# Patient Record
Sex: Male | Born: 1935 | Race: White | Hispanic: No | Smoking: Former smoker
Health system: Southern US, Community
[De-identification: ages and names within clinical notes are randomized; demographics above are authoritative.]

## PROBLEM LIST (undated history)

## (undated) DIAGNOSIS — I10 Essential (primary) hypertension: Secondary | ICD-10-CM

## (undated) DIAGNOSIS — T8859XA Other complications of anesthesia, initial encounter: Secondary | ICD-10-CM

## (undated) DIAGNOSIS — Z9289 Personal history of other medical treatment: Secondary | ICD-10-CM

## (undated) DIAGNOSIS — G3183 Dementia with Lewy bodies: Secondary | ICD-10-CM

## (undated) DIAGNOSIS — F0281 Dementia in other diseases classified elsewhere with behavioral disturbance: Secondary | ICD-10-CM

## (undated) DIAGNOSIS — Z8719 Personal history of other diseases of the digestive system: Secondary | ICD-10-CM

## (undated) DIAGNOSIS — F02818 Neurocognitive disorder with Lewy bodies: Secondary | ICD-10-CM

## (undated) DIAGNOSIS — Z9109 Other allergy status, other than to drugs and biological substances: Secondary | ICD-10-CM

## (undated) DIAGNOSIS — Z860100 Personal history of colon polyps, unspecified: Secondary | ICD-10-CM

## (undated) DIAGNOSIS — S2249XA Multiple fractures of ribs, unspecified side, initial encounter for closed fracture: Secondary | ICD-10-CM

## (undated) DIAGNOSIS — G4730 Sleep apnea, unspecified: Secondary | ICD-10-CM

## (undated) DIAGNOSIS — C189 Malignant neoplasm of colon, unspecified: Secondary | ICD-10-CM

## (undated) DIAGNOSIS — T4145XA Adverse effect of unspecified anesthetic, initial encounter: Secondary | ICD-10-CM

## (undated) DIAGNOSIS — K219 Gastro-esophageal reflux disease without esophagitis: Secondary | ICD-10-CM

## (undated) DIAGNOSIS — D693 Immune thrombocytopenic purpura: Secondary | ICD-10-CM

## (undated) DIAGNOSIS — N200 Calculus of kidney: Secondary | ICD-10-CM

## (undated) DIAGNOSIS — F418 Other specified anxiety disorders: Secondary | ICD-10-CM

## (undated) DIAGNOSIS — C4490 Unspecified malignant neoplasm of skin, unspecified: Secondary | ICD-10-CM

## (undated) DIAGNOSIS — K859 Acute pancreatitis without necrosis or infection, unspecified: Secondary | ICD-10-CM

## (undated) DIAGNOSIS — F3341 Major depressive disorder, recurrent, in partial remission: Secondary | ICD-10-CM

## (undated) HISTORY — DX: Dementia in other diseases classified elsewhere with behavioral disturbance: F02.81

## (undated) HISTORY — PX: UPPER GASTROINTESTINAL ENDOSCOPY: SHX188

## (undated) HISTORY — DX: Neurocognitive disorder with Lewy bodies: G31.83

## (undated) HISTORY — PX: ESOPHAGEAL DILATION: SHX303

## (undated) HISTORY — DX: Personal history of colonic polyps: Z86.010

## (undated) HISTORY — DX: Personal history of colon polyps, unspecified: Z86.0100

## (undated) HISTORY — DX: Malignant neoplasm of colon, unspecified: C18.9

## (undated) HISTORY — DX: Acute pancreatitis without necrosis or infection, unspecified: K85.90

## (undated) HISTORY — DX: Neurocognitive disorder with Lewy bodies: F02.818

## (undated) HISTORY — DX: Major depressive disorder, recurrent, in partial remission: F33.41

## (undated) HISTORY — PX: TOTAL HIP ARTHROPLASTY: SHX124

## (undated) HISTORY — DX: Other allergy status, other than to drugs and biological substances: Z91.09

## (undated) HISTORY — PX: INGUINAL HERNIA REPAIR: SUR1180

## (undated) HISTORY — DX: Personal history of other diseases of the digestive system: Z87.19

## (undated) HISTORY — PX: SKIN CANCER EXCISION: SHX779

## (undated) HISTORY — DX: Unspecified malignant neoplasm of skin, unspecified: C44.90

## (undated) HISTORY — DX: Essential (primary) hypertension: I10

## (undated) HISTORY — PX: PILONIDAL CYST EXCISION: SHX744

## (undated) HISTORY — DX: Gastro-esophageal reflux disease without esophagitis: K21.9

## (undated) HISTORY — DX: Sleep apnea, unspecified: G47.30

## (undated) HISTORY — DX: Calculus of kidney: N20.0

## (undated) HISTORY — DX: Major depressive disorder, recurrent, in partial remission: F41.8

---
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
## 1999-05-16 HISTORY — PX: CARDIAC CATHETERIZATION: SHX172

## 2005-04-03 ENCOUNTER — Ambulatory Visit: Payer: Self-pay | Admitting: Urology

## 2005-08-17 ENCOUNTER — Ambulatory Visit: Payer: Self-pay | Admitting: Unknown Physician Specialty

## 2006-09-14 HISTORY — PX: LITHOTRIPSY: SUR834

## 2007-01-30 ENCOUNTER — Emergency Department: Payer: Self-pay | Admitting: Emergency Medicine

## 2007-02-08 ENCOUNTER — Ambulatory Visit: Payer: Self-pay | Admitting: Urology

## 2007-02-09 ENCOUNTER — Ambulatory Visit: Payer: Self-pay | Admitting: Urology

## 2007-02-10 ENCOUNTER — Ambulatory Visit: Payer: Self-pay | Admitting: Urology

## 2007-02-24 ENCOUNTER — Ambulatory Visit: Payer: Self-pay | Admitting: Urology

## 2008-02-20 ENCOUNTER — Ambulatory Visit: Payer: Self-pay | Admitting: Urology

## 2008-03-15 ENCOUNTER — Other Ambulatory Visit: Payer: Self-pay

## 2008-03-15 ENCOUNTER — Emergency Department: Payer: Self-pay | Admitting: Emergency Medicine

## 2008-06-14 ENCOUNTER — Ambulatory Visit: Payer: Self-pay | Admitting: Oncology

## 2008-07-09 ENCOUNTER — Ambulatory Visit: Payer: Self-pay | Admitting: Oncology

## 2008-07-15 ENCOUNTER — Ambulatory Visit: Payer: Self-pay | Admitting: Oncology

## 2008-08-14 ENCOUNTER — Ambulatory Visit: Payer: Self-pay | Admitting: Oncology

## 2008-10-15 ENCOUNTER — Ambulatory Visit: Payer: Self-pay | Admitting: Oncology

## 2008-10-29 ENCOUNTER — Ambulatory Visit: Payer: Self-pay | Admitting: Oncology

## 2008-11-12 ENCOUNTER — Ambulatory Visit: Payer: Self-pay | Admitting: Oncology

## 2009-01-12 ENCOUNTER — Ambulatory Visit: Payer: Self-pay | Admitting: Oncology

## 2009-02-11 ENCOUNTER — Ambulatory Visit: Payer: Self-pay | Admitting: Oncology

## 2009-02-12 ENCOUNTER — Ambulatory Visit: Payer: Self-pay | Admitting: Oncology

## 2009-02-19 ENCOUNTER — Ambulatory Visit: Payer: Self-pay | Admitting: Urology

## 2009-04-14 ENCOUNTER — Ambulatory Visit: Payer: Self-pay | Admitting: Oncology

## 2009-04-29 ENCOUNTER — Ambulatory Visit: Payer: Self-pay | Admitting: Oncology

## 2009-05-15 ENCOUNTER — Ambulatory Visit: Payer: Self-pay | Admitting: Oncology

## 2009-07-29 ENCOUNTER — Ambulatory Visit: Payer: Self-pay | Admitting: Oncology

## 2009-08-14 ENCOUNTER — Ambulatory Visit: Payer: Self-pay | Admitting: Oncology

## 2009-09-14 HISTORY — PX: CHOLECYSTECTOMY: SHX55

## 2009-10-15 ENCOUNTER — Ambulatory Visit: Payer: Self-pay | Admitting: Oncology

## 2009-10-15 DIAGNOSIS — K859 Acute pancreatitis without necrosis or infection, unspecified: Secondary | ICD-10-CM

## 2009-10-15 HISTORY — DX: Acute pancreatitis without necrosis or infection, unspecified: K85.90

## 2009-10-28 ENCOUNTER — Ambulatory Visit: Payer: Self-pay | Admitting: Oncology

## 2009-11-09 ENCOUNTER — Inpatient Hospital Stay: Payer: Self-pay | Admitting: General Surgery

## 2009-11-12 ENCOUNTER — Ambulatory Visit: Payer: Self-pay | Admitting: Oncology

## 2009-11-26 ENCOUNTER — Encounter: Admission: RE | Admit: 2009-11-26 | Discharge: 2009-11-26 | Payer: Self-pay | Admitting: Neurology

## 2010-01-27 ENCOUNTER — Ambulatory Visit: Payer: Self-pay | Admitting: Oncology

## 2010-02-12 ENCOUNTER — Ambulatory Visit: Payer: Self-pay | Admitting: Oncology

## 2010-04-28 ENCOUNTER — Ambulatory Visit: Payer: Self-pay | Admitting: Oncology

## 2010-05-15 ENCOUNTER — Ambulatory Visit: Payer: Self-pay | Admitting: Oncology

## 2010-10-05 ENCOUNTER — Encounter: Payer: Self-pay | Admitting: Neurology

## 2010-10-30 ENCOUNTER — Ambulatory Visit: Payer: Self-pay | Admitting: Oncology

## 2010-11-13 ENCOUNTER — Ambulatory Visit: Payer: Self-pay | Admitting: Oncology

## 2011-03-05 ENCOUNTER — Ambulatory Visit: Payer: Self-pay | Admitting: Urology

## 2011-05-06 ENCOUNTER — Ambulatory Visit: Payer: Self-pay | Admitting: Oncology

## 2011-05-11 ENCOUNTER — Ambulatory Visit (INDEPENDENT_AMBULATORY_CARE_PROVIDER_SITE_OTHER): Payer: Medicare Other | Admitting: Family Medicine

## 2011-05-11 ENCOUNTER — Encounter: Payer: Self-pay | Admitting: Family Medicine

## 2011-05-11 DIAGNOSIS — N200 Calculus of kidney: Secondary | ICD-10-CM | POA: Insufficient documentation

## 2011-05-11 DIAGNOSIS — G4730 Sleep apnea, unspecified: Secondary | ICD-10-CM | POA: Insufficient documentation

## 2011-05-11 DIAGNOSIS — Z9109 Other allergy status, other than to drugs and biological substances: Secondary | ICD-10-CM | POA: Insufficient documentation

## 2011-05-11 DIAGNOSIS — I10 Essential (primary) hypertension: Secondary | ICD-10-CM

## 2011-05-11 DIAGNOSIS — K219 Gastro-esophageal reflux disease without esophagitis: Secondary | ICD-10-CM

## 2011-05-11 DIAGNOSIS — F0390 Unspecified dementia without behavioral disturbance: Secondary | ICD-10-CM | POA: Insufficient documentation

## 2011-05-11 NOTE — Progress Notes (Signed)
  Subjective:    Patient ID: John Summers, male    DOB: Aug 02, 1933, 77 y.o.   MRN: [id number]  HPI  John Summers, a 77 y.o. male presents today in the office for the following:    HTN:  BP Readings from Last 3 Encounters:  05/11/11 120/70   Stable on Maxide  Low platelets - seeing Dr. Orlie Dakin, Platelets around 110.  Memory problems, several years. Slowly has been gietting worse. Taking Aricept  Vision difficulties, Saw Dr. Oren Bracket and Neurologist both. Some double vision and has stopped driving at night.   The PMH, PSH, Social History, Family History, Medications, and allergies have been reviewed in [facility], and have been updated if relevant.  Review of Systems ROS: GEN: No acute illnesses, no fevers, chills. GI: No n/v/d, eating normally Pulm: No SOB Interactive and getting along well at home.  Otherwise, ROS is as per the HPI.     Objective:   Physical Exam   Physical Exam  Blood pressure 120/70, pulse 76, temperature 98.7 F (37.1 C), temperature source Oral, height 5' 9.25" (1.759 m), weight 242 lb (109.77 kg), SpO2 98.00%.  GEN: WDWN, NAD, Non-toxic, A & O x 3 HEENT: Atraumatic, Normocephalic. Neck supple. No masses, No LAD. Ears and Nose: No external deformity. CV: RRR, No M/G/R. No JVD. No thrill. No extra heart sounds. PULM: CTA B, no wheezes, crackles, rhonchi. No retractions. No resp. distress. No accessory muscle use. EXTR: No c/c/e NEURO Normal gait.  PSYCH: Normally interactive. Conversant. Not depressed or anxious appearing.  Calm demeanor.        Assessment & Plan:   1. GERD (gastroesophageal reflux disease)   2. Dementia   3. Unspecified essential hypertension   cont on current meds  Reviewed labs.  We will obtain records from the patient's prior physicians.

## 2011-05-11 NOTE — Patient Instructions (Signed)
F/u 6 months for 30 minute CPX  Labs a few days before

## 2011-05-16 ENCOUNTER — Ambulatory Visit: Payer: Self-pay | Admitting: Oncology

## 2011-08-20 ENCOUNTER — Ambulatory Visit (INDEPENDENT_AMBULATORY_CARE_PROVIDER_SITE_OTHER): Payer: 59

## 2011-08-20 DIAGNOSIS — Z23 Encounter for immunization: Secondary | ICD-10-CM

## 2011-10-30 ENCOUNTER — Other Ambulatory Visit: Payer: Self-pay | Admitting: Family Medicine

## 2011-10-30 DIAGNOSIS — Z79899 Other long term (current) drug therapy: Secondary | ICD-10-CM

## 2011-10-30 DIAGNOSIS — Z13220 Encounter for screening for lipoid disorders: Secondary | ICD-10-CM

## 2011-11-05 ENCOUNTER — Other Ambulatory Visit (INDEPENDENT_AMBULATORY_CARE_PROVIDER_SITE_OTHER): Payer: 59

## 2011-11-05 DIAGNOSIS — Z79899 Other long term (current) drug therapy: Secondary | ICD-10-CM

## 2011-11-05 DIAGNOSIS — Z13220 Encounter for screening for lipoid disorders: Secondary | ICD-10-CM

## 2011-11-05 LAB — BASIC METABOLIC PANEL
Calcium: 8.8 mg/dL (ref 8.4–10.5)
Chloride: 108 mEq/L (ref 96–112)
GFR: 74.41 mL/min (ref >60.00)
Potassium: 4.1 mEq/L (ref 3.5–5.1)

## 2011-11-05 LAB — CBC WITH DIFFERENTIAL/PLATELET
Eosinophils Absolute: 0.1 10*3/uL (ref 0.0–0.7)
Lymphs Abs: 1.6 10*3/uL (ref 0.7–4.0)
MCV: 99.5 fl (ref 78.0–100.0)
Monocytes Absolute: 0.5 10*3/uL (ref 0.1–1.0)
Monocytes Relative: 11.4 % (ref 3.0–12.0)
RBC: 4.2 Mil/uL — ABNORMAL LOW (ref 4.22–5.81)

## 2011-11-05 LAB — HEPATIC FUNCTION PANEL
AST: 13 U/L (ref 0–37)
Bilirubin, Direct: 0.1 mg/dL (ref 0.0–0.3)
Total Bilirubin: 0.5 mg/dL (ref 0.3–1.2)
Total Protein: 7.1 g/dL (ref 6.0–8.3)

## 2011-11-05 LAB — LIPID PANEL
Cholesterol: 169 mg/dL (ref 0–200)
LDL Cholesterol: 106 mg/dL — ABNORMAL HIGH (ref 0–99)
Total CHOL/HDL Ratio: 4
Triglycerides: 112 mg/dL (ref 0.0–149.0)
VLDL: 22.4 mg/dL (ref 0.0–40.0)

## 2011-11-06 ENCOUNTER — Ambulatory Visit: Payer: Self-pay | Admitting: Oncology

## 2011-11-06 LAB — CBC CANCER CENTER
Basophil #: 0 x10 3/mm (ref 0.0–0.1)
Basophil %: 0.8 %
Eosinophil #: 0.1 x10 3/mm (ref 0.0–0.7)
HCT: 40.7 % (ref 40.0–52.0)
Lymphocyte #: 1.6 x10 3/mm (ref 1.0–3.6)
Lymphocyte %: 35.2 %
MCH: 34 pg (ref 26.0–34.0)
MCHC: 34.4 g/dL (ref 32.0–36.0)
MCV: 99 fL (ref 80–100)
Monocyte #: 0.4 x10 3/mm (ref 0.0–0.7)
Monocyte %: 8.3 %
Neutrophil #: 2.5 x10 3/mm (ref 1.4–6.5)
Platelet: 130 x10 3/mm — ABNORMAL LOW (ref 150–440)
RBC: 4.11 10*6/uL — ABNORMAL LOW (ref 4.40–5.90)
RDW: 13.8 % (ref 11.5–14.5)

## 2011-11-06 LAB — BASIC METABOLIC PANEL
Anion Gap: 8 (ref 7–16)
BUN: 16 mg/dL (ref 7–18)
Calcium, Total: 8.4 mg/dL — ABNORMAL LOW (ref 8.5–10.1)
Co2: 28 mmol/L (ref 21–32)
EGFR (African American): >60
EGFR (Non-African Amer.): >60
Glucose: 100 mg/dL — ABNORMAL HIGH (ref 65–99)
Osmolality: 283 (ref 275–301)
Potassium: 4 mmol/L (ref 3.5–5.1)
Sodium: 141 mmol/L (ref 136–145)

## 2011-11-09 LAB — URINE IEP, RANDOM

## 2011-11-09 LAB — PROT IMMUNOELECTROPHORES(ARMC)

## 2011-11-11 ENCOUNTER — Ambulatory Visit (INDEPENDENT_AMBULATORY_CARE_PROVIDER_SITE_OTHER): Payer: 59 | Admitting: Family Medicine

## 2011-11-11 ENCOUNTER — Encounter: Payer: Self-pay | Admitting: Family Medicine

## 2011-11-11 VITALS — BP 130/82 | HR 66 | Temp 97.4°F | Ht 70.5 in | Wt 243.8 lb

## 2011-11-11 DIAGNOSIS — Z23 Encounter for immunization: Secondary | ICD-10-CM

## 2011-11-11 DIAGNOSIS — Z0000 Encounter for general adult medical examination without abnormal findings: Secondary | ICD-10-CM

## 2011-11-11 NOTE — Progress Notes (Signed)
Patient Name: John Summers Date of Birth: 07-01-1933 Age: 78 y.o. Medical Record Number: [id number] Gender: male Date of Encounter: 11/11/2011  History of Present Illness:  SRIKAR CHIANG is a 78 y.o. very pleasant male patient who presents with the following:  z p  Overall doing well without any significant problems. [name] labs were done in a nonfasting state. He has some occasional aches and pains, but otherwise is doing well. He does still have some difficulty with his memory.  Preventative Health Maintenance Visit:  Health Maintenance Summary Reviewed and updated, unless pt declines services.  Tobacco History Reviewed. Alcohol: No concerns, no excessive use Exercise Habits: Some activity, rec at least 30 mins 5 times a week Drug Use: None  Labs reviewed with the patient.   Lipids:    Component Value Date/Time   CHOL 169 11/05/2011 0929   TRIG 112.0 11/05/2011 0929   HDL 40.70 11/05/2011 0929   VLDL 22.4 11/05/2011 0929   CHOLHDL 4 11/05/2011 0929    CBC:    Component Value Date/Time   WBC 4.1* 11/05/2011 0929   HGB 14.2 11/05/2011 0929   HCT 41.8 11/05/2011 0929   PLT 118.0* 11/05/2011 0929   MCV 99.5 11/05/2011 0929   NEUTROABS 2.0 11/05/2011 0929   LYMPHSABS 1.6 11/05/2011 0929   MONOABS 0.5 11/05/2011 0929   EOSABS 0.1 11/05/2011 0929   BASOSABS 0.0 11/05/2011 0929    Basic Metabolic Panel:    Component Value Date/Time   NA 141 11/05/2011 0929   K 4.1 11/05/2011 0929   CL 108 11/05/2011 0929   CO2 27 11/05/2011 0929   BUN 12 11/05/2011 0929   CREATININE 1.0 11/05/2011 0929   GLUCOSE 126* 11/05/2011 0929   CALCIUM 8.8 11/05/2011 0929    Lab Results  Component Value Date   ALT 15 11/05/2011   AST 13 11/05/2011   ALKPHOS 60 11/05/2011   BILITOT 0.5 11/05/2011    Past Medical History, Surgical History, Social History, Family History, Problem List, Medications, and Allergies have been reviewed and updated if relevant.  Review of Systems:  General:  Denies fever, chills, sweats. No significant weight loss. Eyes: Denies blurring,significant itching ENT: Denies earache, sore throat, and hoarseness. Cardiovascular: Denies chest pains, palpitations, dyspnea on exertion Respiratory: Denies cough, dyspnea at rest,wheeezing Breast: no concerns about lumps GI: Denies nausea, vomiting, diarrhea, constipation, change in bowel habits, abdominal pain, melena, hematochezia GU: Denies penile discharge, ED, urinary flow / outflow problems. No STD concerns. Musculoskeletal: Denies back pain, joint pain Derm: Denies rash, itching Neuro: Denies  paresthesias, frequent falls, frequent headaches Psych: Denies depression, anxiety Endocrine: Denies cold intolerance, heat intolerance, polydipsia Heme: Denies enlarged lymph nodes Allergy: No hayfever   Physical Examination: Filed Vitals:   11/11/11 1446  BP: 130/82  Pulse: 66  Temp: 97.4 F (36.3 C)  TempSrc: Oral  Height: 5' 10.5" (1.791 m)  Weight: 243 lb 12.8 oz (110.587 kg)  SpO2: 98%    Body mass index is 34.49 kg/(m^2).   Wt Readings from Last 3 Encounters:  11/11/11 243 lb 12.8 oz (110.587 kg)  05/11/11 242 lb (109.77 kg)    GEN: well developed, well nourished, no acute distress Eyes: conjunctiva and lids normal, PERRLA, EOMI ENT: TM clear, nares clear, oral exam WNL Neck: supple, no lymphadenopathy, no thyromegaly, no JVD Pulm: clear to auscultation and percussion, respiratory effort normal CV: regular rate and rhythm, S1-S2, no murmur, rub or gallop, no bruits, peripheral pulses normal and symmetric, no cyanosis,  clubbing, edema or varicosities Chest: no scars, masses GI: soft, non-tender; no hepatosplenomegaly, masses; active bowel sounds all quadrants GU: no hernia, testicular mass, penile discharge, or prostate enlargement Lymph: no cervical, axillary or inguinal adenopathy MSK: gait normal, muscle tone and strength WNL, no joint swelling, effusions, discoloration, crepitus    SKIN: clear, good turgor, color WNL, no rashes, lesions, or ulcerations Neuro: normal mental status, normal strength, sensation, and motion Psych: alert; oriented to person, place and time, normally interactive and not anxious or depressed in appearance.   Assessment and Plan: 1. Routine general medical examination at a health care facility     [name] preventative maintenance and recommended screening tests for an annual wellness exam were reviewed in full today. Brought up to date unless services declined.  Counselled on the importance of diet, exercise, and its role in overall health and mortality. [name] FH and SH was reviewed, including their home life, tobacco status, and drug and alcohol status.   Written for zostavax

## 2011-11-13 ENCOUNTER — Ambulatory Visit: Payer: Self-pay | Admitting: Oncology

## 2011-11-16 NOTE — Progress Notes (Signed)
Addended by: Consuello Masse on: 11/16/2011 07:15 AM   Modules accepted: Orders

## 2011-12-16 ENCOUNTER — Other Ambulatory Visit: Payer: Self-pay

## 2011-12-16 MED ORDER — TRIAMTERENE-HCTZ 37.5-25 MG PO TABS: 0.5000 | ORAL_TABLET | Freq: Every day | ORAL | Status: DC

## 2011-12-16 NOTE — Telephone Encounter (Signed)
Pts wife called requesting Triamterene-HCTZ 37.5-25 mg #45 x 3 to [facility]. Informed pt wife refilling now. Pt seen 11/11/11.

## 2012-02-20 ENCOUNTER — Encounter: Payer: Self-pay | Admitting: Family Medicine

## 2012-02-20 ENCOUNTER — Inpatient Hospital Stay (HOSPITAL_COMMUNITY)
Admission: EM | Admit: 2012-02-20 | Discharge: 2012-02-23 | DRG: 392 | Disposition: A | Discharge status: Home / Self Care | Payer: Medicare Other | Attending: Internal Medicine | Admitting: Internal Medicine

## 2012-02-20 ENCOUNTER — Ambulatory Visit (INDEPENDENT_AMBULATORY_CARE_PROVIDER_SITE_OTHER): Payer: 59 | Admitting: Family Medicine

## 2012-02-20 ENCOUNTER — Emergency Department (HOSPITAL_COMMUNITY): Payer: Medicare Other

## 2012-02-20 ENCOUNTER — Encounter (HOSPITAL_COMMUNITY): Payer: Self-pay | Admitting: Emergency Medicine

## 2012-02-20 VITALS — BP 84/62 | HR 100 | Temp 97.3°F

## 2012-02-20 DIAGNOSIS — I951 Orthostatic hypotension: Secondary | ICD-10-CM

## 2012-02-20 DIAGNOSIS — R197 Diarrhea, unspecified: Principal | ICD-10-CM | POA: Diagnosis present

## 2012-02-20 DIAGNOSIS — M79609 Pain in unspecified limb: Secondary | ICD-10-CM | POA: Diagnosis not present

## 2012-02-20 DIAGNOSIS — E876 Hypokalemia: Secondary | ICD-10-CM | POA: Diagnosis present

## 2012-02-20 DIAGNOSIS — Z800 Family history of malignant neoplasm of digestive organs: Secondary | ICD-10-CM

## 2012-02-20 DIAGNOSIS — R112 Nausea with vomiting, unspecified: Secondary | ICD-10-CM | POA: Diagnosis present

## 2012-02-20 DIAGNOSIS — Z9109 Other allergy status, other than to drugs and biological substances: Secondary | ICD-10-CM

## 2012-02-20 DIAGNOSIS — N200 Calculus of kidney: Secondary | ICD-10-CM

## 2012-02-20 DIAGNOSIS — G4730 Sleep apnea, unspecified: Secondary | ICD-10-CM | POA: Diagnosis present

## 2012-02-20 DIAGNOSIS — E869 Volume depletion, unspecified: Secondary | ICD-10-CM

## 2012-02-20 DIAGNOSIS — K219 Gastro-esophageal reflux disease without esophagitis: Secondary | ICD-10-CM

## 2012-02-20 DIAGNOSIS — F0390 Unspecified dementia without behavioral disturbance: Secondary | ICD-10-CM

## 2012-02-20 DIAGNOSIS — T888XXA Other specified complications of surgical and medical care, not elsewhere classified, initial encounter: Secondary | ICD-10-CM | POA: Diagnosis not present

## 2012-02-20 DIAGNOSIS — I10 Essential (primary) hypertension: Secondary | ICD-10-CM

## 2012-02-20 DIAGNOSIS — R1110 Vomiting, unspecified: Secondary | ICD-10-CM

## 2012-02-20 LAB — COMPREHENSIVE METABOLIC PANEL
AST: 21 U/L (ref 0–37)
Albumin: 3.6 g/dL (ref 3.5–5.2)
Alkaline Phosphatase: 76 U/L (ref 39–117)
BUN: 22 mg/dL (ref 6–23)
CO2: 24 mEq/L (ref 19–32)
Creatinine, Ser: 1.05 mg/dL (ref 0.50–1.35)
GFR calc Af Amer: 77 mL/min — ABNORMAL LOW (ref >90)
Glucose, Bld: 152 mg/dL — ABNORMAL HIGH (ref 70–99)
Sodium: 140 mEq/L (ref 135–145)

## 2012-02-20 LAB — CBC
Hemoglobin: 15.1 g/dL (ref 13.0–17.0)
MCV: 94.8 fL (ref 78.0–100.0)
RBC: 4.45 MIL/uL (ref 4.22–5.81)

## 2012-02-20 LAB — DIFFERENTIAL
Basophils Absolute: 0 10*3/uL (ref 0.0–0.1)
Eosinophils Absolute: 0 10*3/uL (ref 0.0–0.7)
Eosinophils Relative: 0 % (ref 0–5)
Lymphocytes Relative: 9 % — ABNORMAL LOW (ref 12–46)
Monocytes Absolute: 0.5 10*3/uL (ref 0.1–1.0)
Neutro Abs: 6.6 10*3/uL (ref 1.7–7.7)
Neutrophils Relative %: 85 % — ABNORMAL HIGH (ref 43–77)

## 2012-02-20 LAB — POCT I-STAT TROPONIN I: Troponin i, poc: 0 ng/mL (ref 0.00–0.08)

## 2012-02-20 MED ORDER — SODIUM CHLORIDE 0.9 % IV SOLN: INTRAVENOUS | Status: DC

## 2012-02-20 MED ORDER — ONDANSETRON HCL 4 MG/2ML IJ SOLN: 4.0000 mg | Freq: Three times a day (TID) | INTRAMUSCULAR | Status: DC | PRN

## 2012-02-20 MED ORDER — FAMOTIDINE IN NACL 20-0.9 MG/50ML-% IV SOLN
20.0000 mg | Freq: Two times a day (BID) | INTRAVENOUS | Status: DC
  Administered 2012-02-20 – 2012-02-22 (×4): 20 mg via INTRAVENOUS
  Filled 2012-02-20 (×5): qty 50

## 2012-02-20 MED ORDER — ENOXAPARIN SODIUM 40 MG/0.4ML ~~LOC~~ SOLN
40.0000 mg | SUBCUTANEOUS | Status: DC
  Administered 2012-02-20 – 2012-02-22 (×3): 40 mg via SUBCUTANEOUS
  Filled 2012-02-20 (×4): qty 0.4

## 2012-02-20 MED ORDER — MORPHINE SULFATE 2 MG/ML IJ SOLN
0.5000 mg | INTRAMUSCULAR | Status: DC | PRN
  Administered 2012-02-21: 0.5 mg via INTRAVENOUS
  Filled 2012-02-20: qty 1

## 2012-02-20 MED ORDER — METRONIDAZOLE IN NACL 5-0.79 MG/ML-% IV SOLN
500.0000 mg | Freq: Three times a day (TID) | INTRAVENOUS | Status: DC
  Administered 2012-02-20 – 2012-02-22 (×5): 500 mg via INTRAVENOUS
  Filled 2012-02-20 (×7): qty 100

## 2012-02-20 MED ORDER — ONDANSETRON HCL 4 MG/2ML IJ SOLN
4.0000 mg | Freq: Once | INTRAMUSCULAR | Status: AC
  Administered 2012-02-20: 4 mg via INTRAVENOUS
  Filled 2012-02-20: qty 2

## 2012-02-20 MED ORDER — CHLORHEXIDINE GLUCONATE 0.12 % MT SOLN
15.0000 mL | Freq: Two times a day (BID) | OROMUCOSAL | Status: DC
  Filled 2012-02-20 (×2): qty 15

## 2012-02-20 MED ORDER — BIOTENE DRY MOUTH MT LIQD
15.0000 mL | Freq: Two times a day (BID) | OROMUCOSAL | Status: DC
  Administered 2012-02-20: 15 mL via OROMUCOSAL

## 2012-02-20 MED ORDER — ACETAMINOPHEN 650 MG RE SUPP: 650.0000 mg | Freq: Four times a day (QID) | RECTAL | Status: DC | PRN

## 2012-02-20 MED ORDER — SODIUM CHLORIDE 0.9 % IV SOLN
INTRAVENOUS | Status: DC
  Administered 2012-02-20 – 2012-02-22 (×4): via INTRAVENOUS

## 2012-02-20 MED ORDER — DONEPEZIL HCL 10 MG PO TABS
10.0000 mg | ORAL_TABLET | Freq: Every day | ORAL | Status: DC
  Administered 2012-02-20 – 2012-02-22 (×3): 10 mg via ORAL
  Filled 2012-02-20 (×4): qty 1

## 2012-02-20 MED ORDER — PNEUMOCOCCAL VAC POLYVALENT 25 MCG/0.5ML IJ INJ
0.5000 mL | INJECTION | INTRAMUSCULAR | Status: AC
  Administered 2012-02-21: 0.5 mL via INTRAMUSCULAR
  Filled 2012-02-20: qty 0.5

## 2012-02-20 MED ORDER — SODIUM CHLORIDE 0.9 % IV BOLUS (SEPSIS)
500.0000 mL | Freq: Once | INTRAVENOUS | Status: AC
Start: 2012-02-20 — End: 2012-02-20
  Administered 2012-02-20: 500 mL via INTRAVENOUS

## 2012-02-20 MED ORDER — ACETAMINOPHEN 325 MG PO TABS: 650.0000 mg | ORAL_TABLET | Freq: Four times a day (QID) | ORAL | Status: DC | PRN

## 2012-02-20 MED ORDER — ONDANSETRON HCL 4 MG PO TABS: 4.0000 mg | ORAL_TABLET | Freq: Four times a day (QID) | ORAL | Status: DC | PRN

## 2012-02-20 MED ORDER — ONDANSETRON HCL 4 MG/2ML IJ SOLN
4.0000 mg | Freq: Four times a day (QID) | INTRAMUSCULAR | Status: DC | PRN
  Administered 2012-02-21 – 2012-02-22 (×2): 4 mg via INTRAVENOUS
  Filled 2012-02-20 (×2): qty 2

## 2012-02-20 MED ORDER — IOHEXOL 300 MG/ML  SOLN
100.0000 mL | Freq: Once | INTRAMUSCULAR | Status: AC | PRN
  Administered 2012-02-20: 100 mL via INTRAVENOUS

## 2012-02-20 MED ORDER — SODIUM CHLORIDE 0.9 % IV BOLUS (SEPSIS)
500.0000 mL | Freq: Once | INTRAVENOUS | Status: AC
  Administered 2012-02-20: 500 mL via INTRAVENOUS

## 2012-02-20 MED ORDER — SODIUM CHLORIDE 0.9 % IV BOLUS (SEPSIS)
500.0000 mL | Freq: Once | INTRAVENOUS | Status: AC
  Administered 2012-02-20: 15:00:00 via INTRAVENOUS

## 2012-02-20 NOTE — Assessment & Plan Note (Signed)
Recently exposed to c diff from wife  Exam consistent with dehydration - tachycardic and dry mouth and mildly hypotensive Sent to ER ([facility]) for further eval and fluids and lab work

## 2012-02-20 NOTE — ED Notes (Signed)
Wife stated, I just had C-diff and last night he (my husband) started having nausea and vomiting and really didn't feel good when he went to bed.  Seen Dr. Milinda Antis this am and said to come here he was probably dehydrated.

## 2012-02-20 NOTE — Progress Notes (Signed)
Subjective:    Patient ID: John Summers, male    DOB: May 08, 1933, 78 y.o.   MRN: [id number]  HPI Here for n/v/d  Wife just got out of the hospital with c diff   (of note she had n/v and diarrhea)  Started getting sick vomiting and then started having diarrhea  Stomach pain above the umbilicus No fever  No chills or aches  Feels really rotton   Slowed down the last hour  No blood in stool  No fluids yet-- was too nauseated   Today is somewhat dizzy bp is 84/62 and pulse 100- afraid he is dehydrated Feels thirsty with dry mouth  Patient Active Problem List  Diagnoses  . GERD (gastroesophageal reflux disease)  . Pollen allergies  . Kidney stones  . Dementia  . Sleep apnea  . Unspecified essential hypertension  . Nausea & vomiting   Past Medical History  Diagnosis Date  . GERD (gastroesophageal reflux disease)   . Pollen allergies   . Kidney stones   . Dementia   . Sleep apnea   . Unspecified essential hypertension   . Thrombocytopenia     Dr. Orlie Dakin follows   Past Surgical History  Procedure Date  . Cholecystectomy 2011  . Tonsillectomy and adenoidectomy 1946  . Total hip arthroplasty     left  . Lithotripsy 2008   History  Substance Use Topics  . Smoking status: Never Smoker   . Smokeless tobacco: Not on file  . Alcohol Use: Yes   Family History  Problem Relation Age of Onset  . Cancer Mother     colon  . Heart disease Mother   . Hypertension Mother   . Alcohol abuse Father    No Known Allergies Current Outpatient Prescriptions on File Prior to Visit  Medication Sig Dispense Refill  . donepezil (ARICEPT) 10 MG tablet Take 10 mg by mouth at bedtime.        Marland Kitchen omeprazole (PRILOSEC) 20 MG capsule Take 20 mg by mouth daily.        Marland Kitchen triamterene-hydrochlorothiazide (MAXZIDE-25) 37.5-25 MG per tablet Take 0.5 each (0.5 tablets total) by mouth daily.  45 tablet  3           Review of Systems Review of Systems  Constitutional: Negative for  fever, appetite change,  and unexpected weight change. pos for fatigue/ malaise  Eyes: Negative for pain and visual disturbance.  ENT pos for dry mouth, neg for congestion  Respiratory: Negative for cough and shortness of breath.   Cardiovascular: Negative for cp or palpitations    Gastrointestinal: Negative for blood in stool, pos for abd pain and cramping / upper abdomen Genitourinary: Negative for urgency and frequency.  Skin: Negative for pallor or rash   Neurological: Negative for weakness, light-headedness, numbness and headaches.  Hematological: Negative for adenopathy. Does not bruise/bleed easily.  Psychiatric/Behavioral: Negative for dysphoric mood. The patient is not nervous/anxious.          Objective:   Physical Exam  Constitutional: He appears well-developed and well-nourished.       Sick appearing/ in wheelchair   HENT:  Head: Normocephalic and atraumatic.  Right Ear: External ear normal.  Left Ear: External ear normal.  Mouth/Throat: No oropharyngeal exudate.       Mildly dry mucous membranes  Throat clear   Eyes: Conjunctivae and EOM are normal. Pupils are equal, round, and reactive to light. Right eye exhibits no discharge. Left eye exhibits no discharge.  Neck: Normal  range of motion. Neck supple.  Cardiovascular: Regular rhythm and normal heart sounds.        Tachycardic   Pulmonary/Chest: Effort normal and breath sounds normal. No respiratory distress.  Abdominal: Soft. Bowel sounds are normal. There is tenderness.       Epigastric tenderness Mildly hyperactive bs   Lymphadenopathy:    He has no cervical adenopathy.  Neurological: He is alert.  Skin: Skin is warm and dry. No rash noted. No erythema.       Fair skin turgor  Psychiatric: He has a normal mood and affect.       Nl affect but fatigued and sick appearing           Assessment & Plan:

## 2012-02-20 NOTE — ED Provider Notes (Signed)
History     CSN: [id number]  Arrival date & time 02/20/12  1309   First MD Initiated Contact with Patient 02/20/12 1326      Chief Complaint  Patient presents with  . Emesis    (Consider location/radiation/quality/duration/timing/severity/associated sxs/prior treatment) HPI Pt has had multiple episodes of vomiting and diarrhea since around 0300 today. Pt was seen by PMD and found to be orthostatic and sent ED for rehydration. Pt c/o abd pain in LUQ. No fever, chills. No blood in stool. Wife recently d/c from hosp with c diff.  Past Medical History  Diagnosis Date  . GERD (gastroesophageal reflux disease)   . Pollen allergies   . Kidney stones   . Dementia   . Sleep apnea   . Unspecified essential hypertension   . Thrombocytopenia     Dr. Orlie Dakin follows    Past Surgical History  Procedure Date  . Cholecystectomy 2011  . Tonsillectomy and adenoidectomy 1946  . Total hip arthroplasty     left  . Lithotripsy 2008    Family History  Problem Relation Age of Onset  . Cancer Mother     colon  . Heart disease Mother   . Hypertension Mother   . Alcohol abuse Father     History  Substance Use Topics  . Smoking status: Never Smoker   . Smokeless tobacco: Not on file  . Alcohol Use: Yes      Review of Systems  Constitutional: Negative for fever and chills.  HENT: Negative for sore throat, neck pain and neck stiffness.   Respiratory: Negative for shortness of breath.   Cardiovascular: Negative for chest pain, palpitations and leg swelling.  Gastrointestinal: Positive for nausea, vomiting, abdominal pain and diarrhea. Negative for constipation and blood in stool.  Musculoskeletal: Negative for back pain.  Skin: Negative for pallor and rash.  Neurological: Positive for dizziness and light-headedness. Negative for weakness, numbness and headaches.  Psychiatric/Behavioral: Negative for confusion.    Allergies  Chicken allergy  Home Medications   Current  Outpatient Rx  Name Route Sig Dispense Refill  . DONEPEZIL HCL 10 MG PO TABS Oral Take 10 mg by mouth at bedtime.      . OMEPRAZOLE 20 MG PO CPDR Oral Take 20 mg by mouth daily.      . TRIAMTERENE-HCTZ 37.5-25 MG PO TABS Oral Take 0.5 each (0.5 tablets total) by mouth daily. 45 tablet 3    BP 118/74  Pulse 82  Temp(Src) 97.9 F (36.6 C) (Oral)  Resp 16  SpO2 95%  Physical Exam  Nursing note and vitals reviewed. Constitutional: He is oriented to person, place, and time. He appears well-developed and well-nourished. No distress.  HENT:  Head: Normocephalic and atraumatic.  Mouth/Throat: Oropharynx is clear and moist.  Eyes: EOM are normal. Pupils are equal, round, and reactive to light.  Neck: Normal range of motion. Neck supple.  Cardiovascular: Normal rate and regular rhythm.   Pulmonary/Chest: Effort normal and breath sounds normal. No respiratory distress. He has no wheezes. He has no rales.  Abdominal: Soft. Bowel sounds are normal. He exhibits no mass. There is tenderness (TTP LUQ). There is no rebound and no guarding.  Musculoskeletal: Normal range of motion. He exhibits no edema and no tenderness.  Neurological: He is alert and oriented to person, place, and time.       5/5 motor, sensation intact  Skin: Skin is warm and dry. No rash noted. No erythema.  Psychiatric: He has a normal mood  and affect. His behavior is normal.    ED Course  Procedures (including critical care time)  Labs Reviewed  CBC - Abnormal; Notable for the following:    Platelets 133 (*)    All other components within normal limits  DIFFERENTIAL - Abnormal; Notable for the following:    Neutrophils Relative 85 (*)    Lymphocytes Relative 9 (*)    All other components within normal limits  COMPREHENSIVE METABOLIC PANEL - Abnormal; Notable for the following:    Glucose, Bld 152 (*)    GFR calc non Af Amer 66 (*)    GFR calc Af Amer 77 (*)    All other components within normal limits  LIPASE,  BLOOD  POCT I-STAT TROPONIN I  URINALYSIS, ROUTINE W REFLEX MICROSCOPIC  CLOSTRIDIUM DIFFICILE BY PCR   Ct Abdomen Pelvis W Contrast  02/20/2012  *RADIOLOGY REPORT*  Clinical Data: Left upper quadrant pain  CT ABDOMEN AND PELVIS WITH CONTRAST  Technique:  Multidetector CT imaging of the abdomen and pelvis was performed following the standard protocol during bolus administration of intravenous contrast.  Contrast: OMNIPAQUE IOHEXOL 300 MG/ML  SOLN  Comparison: None.  Findings: Lung bases are unremarkable.  Sagittal images of the spine shows osteopenia and mild degenerative changes lumbar spine.  Enhanced liver is unremarkable.  The patient is status post cholecystectomy.  There is small hiatal hernia.  There is small paraesophageal hernia containing fluid measures about 3.9 cm.  Tortuous splenic artery with atherosclerotic calcifications. Spleen, pancreas and adrenal glands are unremarkable.  Kidneys are symmetrical in size and enhancement without evidence of focal mass.  Mild distended small bowel loops are noted with fluid. Mild ileus cannot be excluded.  No air-fluid levels are noted to suggest small bowel obstruction.  There is a small intraluminal lipoma in a  small bowel loop in axial image 45 measures 1.2 cm.  Again there is no evidence of small bowel obstruction.  Small umbilical hernia containing fat without evidence of acute complication.  Fluid is noted in the colon.  In axial image 59 there is a ring- like enhancement of the mucosa in the cecum with concentric appearance.  This is confirmed on the sagittal image 54.  Although findings may be due to adherent stool I cannot exclude a cecal mass.  Follow-up colonoscopy is recommended.  There is no evidence of colitis.  No pericolonic inflammation is noted.  There are metallic artifacts from left hip prosthesis. Small left hydrocele is noted.  No destructive bony lesions are noted within pelvis.  The urinary bladder is unremarkable. Prostate gland  measures 4.2 x 4.7 cm.  CBD measures 1.1 cm in diameter.  Delayed renal images shows bilateral renal symmetrical excretion.  Bilateral visualized proximal ureter is unremarkable.  IMPRESSION:  1. 1.  Small hiatal hernia is noted.  Small paraesophageal hernia containing fluid is noted.  2.  Status post cholecystectomy.  3.  Fluid and mild distended small bowel loops without air fluid levels to suggest small bowel obstruction.  Mild ileus cannot be excluded. A focal intraluminal small bowel lipoma without evidence of acute complication 3.  There is a ring-like high density material or enhancement in the cecum in axial image 59.  Although findings may be due to adherent stool a cecal mass cannot be excluded.  Follow-up colonoscopy is recommended.  4.  No evidence of colitis. 5.  There are metallic artifacts from a  left hip prosthesis. 6.  Small left hydrocele.  Original Report Authenticated  By: LIVIU POP, M.D.     1. Vomiting and diarrhea   2. Orthostasis       MDM   Date: 02/20/2012  Rate: 75  Rhythm: normal sinus rhythm  QRS Axis: normal  Intervals: normal  ST/T Wave abnormalities: normal  Conduction Disutrbances:none  Narrative Interpretation:   Old EKG Reviewed: unchanged          Loren Racer, MD 02/20/12 [id number]

## 2012-02-20 NOTE — Progress Notes (Signed)
Placed patient on CPAP via auto-mode with max pressure of 20cm and minimal pressure of 6cm. Sp02=97% will continue to monitor patient. Instructed patient on how to remove mask if he felt like he might vomit.

## 2012-02-20 NOTE — Plan of Care (Signed)
Problem: Consults Goal: General Medical Patient Education See Patient Education Module for specific education.  Outcome: Completed/Met Date Met:  02/20/12 Safety video, fall prevention

## 2012-02-20 NOTE — Patient Instructions (Signed)
Go on to the ER at [facility] - I think you are dehydrated and need some fluids

## 2012-02-20 NOTE — Progress Notes (Signed)
John Summers [id number] Admission Data: 02/20/2012 7:47 PM Attending Provider: Kela Millin, MD  WUJ:[id number] Copland, MD, MD  John Summers is a 78 y.o. male patient admitted from ED awake, alert  & orientated  X 3, VSS - Blood pressure 114/74, pulse 80, temperature 98.7 F (37.1 C), temperature source Oral, resp. rate 18, height 5\' 10"  (1.778 m), weight 107.9 kg (237 lb 14 oz), SpO2 92.00%. RA, no c/o shortness of breath, no c/o chest pain, no distress noted. Tele # [id number] placed and pt is currently running:normal sinus rhythm.   IV site WDL:  forearm right, condition patent and no redness with a transparent dsg that's clean dry and intact.  Allergies:   Allergies  Allergen Reactions  . Chicken Allergy     unknown     Past Medical History  Diagnosis Date  . GERD (gastroesophageal reflux disease)   . Pollen allergies   . Kidney stones   . Dementia   . Sleep apnea   . Unspecified essential hypertension   . Thrombocytopenia     Dr. Orlie Dakin follows     Pt orientation to unit, room and routine. Information packet given to patient/family and safety video watched.  Admission INP armband ID verified with patient/family, and in place. SR up x 2, fall risk assessment complete with Patient and family verbalizing understanding of risks associated with falls. Pt verbalizes an understanding of how to use the call bell and to call for help before getting out of bed.  Skin, clean-dry- intact without evidence of bruising, or skin tears.   No evidence of skin break down noted on exam. Pt from home with wife with plans to return home when medically cleared.  Will cont to monitor and assist as needed.  Julien Nordmann [facility], RN 02/20/2012 7:47 PM

## 2012-02-20 NOTE — H&P (Signed)
[facility] History and Physical  John Summers RUE:[id number] DOB: [birth year] DOA: 02/20/2012  Referring physician: Dr. Ranae Palms PCP: Hannah Beat, MD, MD   Chief Complaint: Diarrhea, with abdominal pain and vomiting  HPI:  The patient is a 78 year old white male with history of dementia, GERD and hypertension who presents with above complaints that began at 2:30 AM. Of note is that his wife was just discharged from [facility] 0n 6/7 Following treatment for C. difficile colitis after she was admitted with similar symptoms. The patient states that he began having nausea vomiting and diarrhea at 2 AM along with abdominal pain. He describes that the diarrhea has watery, nonbloody and he had a 6-8 this is prior to admission. He he has had abdominal pain as well but mostly when he coughs and it is in his lower abdomen and epigastric area. He he describes the pain in his epigastric area as a burning sensation. He admits to vomiting about 5-6 times nonbloody. Per wife they initially went to the [place] weekend clinic and were asked to come to the ED. At the [facility] ED a CT scan of his abdomen and pelvis was done and fluid and mild distended small bowel loops without air-fluid levels to suggest small bowel obstruction, a ringlike high density material or enhancement in the cecum was noted-adherent stool versus cecal mass per radiologist. The patient was found to be orthostatic in the ED systolic blood pressure lying 147/82 and standing 84/62. He was bolused with IV fluids and stool sent was C. difficile and he is admitted for further evaluation and management. Review of Systems:  The patient denies anorexia, fever, weight loss, vision loss, decreased hearing, hoarseness, chest pain, syncope, dyspnea on exertion, peripheral edema, balance deficits, hemoptysis, abdominal pain, melena, hematochezia, severe indigestion/heartburn, hematuria, incontinence, muscle weakness, transient blindness,  difficulty walking, depression, unusual weight change, abnormal bleeding.   Past Medical History  Diagnosis Date  . GERD (gastroesophageal reflux disease)   . Pollen allergies   . Kidney stones   . Dementia   . Sleep apnea   . Unspecified essential hypertension   . Thrombocytopenia     Dr. Orlie Dakin follows   Past Surgical History  Procedure Date  . Cholecystectomy 2011  . Tonsillectomy and adenoidectomy 1946  . Total hip arthroplasty     left  . Lithotripsy 2008   Social History:  reports that he has never smoked. He does not have any smokeless tobacco history on file. He reports that he drinks alcohol. He reports that he does not use illicit drugs.  Allergies  Allergen Reactions  . Chicken Allergy     unknown    Family History  Problem Relation Age of Onset  . Cancer Mother     colon  . Heart disease Mother   . Hypertension Mother   . Alcohol abuse Father     Prior to Admission medications   Medication Sig Start Date End Date Taking? Authorizing Provider  donepezil (ARICEPT) 10 MG tablet Take 10 mg by mouth at bedtime.     Yes Historical Provider, MD  omeprazole (PRILOSEC) 20 MG capsule Take 20 mg by mouth daily.     Yes Historical Provider, MD  triamterene-hydrochlorothiazide (MAXZIDE-25) 37.5-25 MG per tablet Take 0.5 each (0.5 tablets total) by mouth daily. 12/16/11  Yes Hannah Beat, MD   Physical Exam: Filed Vitals:   02/20/12 1630 02/20/12 1700 02/20/12 1730 02/20/12 1850  BP: 126/70 127/71 120/74 114/74  Pulse: 72 71  80  Temp:    98.7 F (37.1 C)  TempSrc:    Oral  Resp: 9 7 16 18   Height:    5\' 10"  (1.778 m)  Weight:    107.9 kg (237 lb 14 oz)  SpO2: 95% 95%  92%    Constitutional: Vital signs reviewed.  Patient is a well-developed and well-nourished in no acute distress and cooperative with exam. Alert and oriented x3.  Head: Normocephalic and atraumatic Mouth: no erythema or exudates, slightly dry MM Eyes: PERRL, EOMI, conjunctivae normal, No  scleral icterus.  Neck: Supple, Trachea midline normal ROM, No JVD, mass, thyromegaly, or carotid bruit present.  Cardiovascular: RRR, S1 normal, S2 normal, no MRG, pulses symmetric and intact bilaterally Pulmonary/Chest: CTAB, no wheezes, rales, or rhonchi Abdominal: Soft. Tender in the epigastrium and right lower quadrant, non-distended, bowel sounds are normal, no masses, organomegaly, or guarding present.    extremities: No cyanosis and no edema  Hematology: no cervical, inginal, or axillary adenopathy.  Neurological: Alert and appropriate, Strenght is normal and symmetric bilaterally, cranial nerve II-XII are grossly intact, no focal motor deficit. Skin: Warm, dry and intact. No rash.  Psychiatric: Normal mood and affect. speech and behavior is normal.   Labs on Admission:  Basic Metabolic Panel:  Lab 02/20/12 4540  NA 140  K 3.5  CL 104  CO2 24  GLUCOSE 152*  BUN 22  CREATININE 1.05  CALCIUM 8.9  MG --  PHOS --   Liver Function Tests:  Lab 02/20/12 1347  AST 21  ALT 17  ALKPHOS 76  BILITOT 0.7  PROT 7.8  ALBUMIN 3.6    Lab 02/20/12 1347  LIPASE 20  AMYLASE --   No results found for this basename: AMMONIA:5 in the last 168 hours CBC:  Lab 02/20/12 1347  WBC 7.8  NEUTROABS 6.6  HGB 15.1  HCT 42.2  MCV 94.8  PLT 133*   Cardiac Enzymes: No results found for this basename: CKTOTAL:5,CKMB:5,CKMBINDEX:5,TROPONINI:5 in the last 168 hours BNP: No components found with this basename: POCBNP:5 CBG: No results found for this basename: GLUCAP:5 in the last 168 hours  Radiological Exams on Admission: Ct Abdomen Pelvis W Contrast  02/20/2012  *RADIOLOGY REPORT*  Clinical Data: Left upper quadrant pain  CT ABDOMEN AND PELVIS WITH CONTRAST  Technique:  Multidetector CT imaging of the abdomen and pelvis was performed following the standard protocol during bolus administration of intravenous contrast.  Contrast: OMNIPAQUE IOHEXOL 300 MG/ML  SOLN  Comparison:  None.  Findings: Lung bases are unremarkable.  Sagittal images of the spine shows osteopenia and mild degenerative changes lumbar spine.  Enhanced liver is unremarkable.  The patient is status post cholecystectomy.  There is small hiatal hernia.  There is small paraesophageal hernia containing fluid measures about 3.9 cm.  Tortuous splenic artery with atherosclerotic calcifications. Spleen, pancreas and adrenal glands are unremarkable.  Kidneys are symmetrical in size and enhancement without evidence of focal mass.  Mild distended small bowel loops are noted with fluid. Mild ileus cannot be excluded.  No air-fluid levels are noted to suggest small bowel obstruction.  There is a small intraluminal lipoma in a  small bowel loop in axial image 45 measures 1.2 cm.  Again there is no evidence of small bowel obstruction.  Small umbilical hernia containing fat without evidence of acute complication.  Fluid is noted in the colon.  In axial image 59 there is a ring- like enhancement of the mucosa in the cecum with concentric  appearance.  This is confirmed on the sagittal image 54.  Although findings may be due to adherent stool I cannot exclude a cecal mass.  Follow-up colonoscopy is recommended.  There is no evidence of colitis.  No pericolonic inflammation is noted.  There are metallic artifacts from left hip prosthesis. Small left hydrocele is noted.  No destructive bony lesions are noted within pelvis.  The urinary bladder is unremarkable. Prostate gland measures 4.2 x 4.7 cm.  CBD measures 1.1 cm in diameter.  Delayed renal images shows bilateral renal symmetrical excretion.  Bilateral visualized proximal ureter is unremarkable.  IMPRESSION:  1. 1.  Small hiatal hernia is noted.  Small paraesophageal hernia containing fluid is noted.  2.  Status post cholecystectomy.  3.  Fluid and mild distended small bowel loops without air fluid levels to suggest small bowel obstruction.  Mild ileus cannot be excluded. A focal  intraluminal small bowel lipoma without evidence of acute complication 3.  There is a ring-like high density material or enhancement in the cecum in axial image 59.  Although findings may be due to adherent stool a cecal mass cannot be excluded.  Follow-up colonoscopy is recommended.  4.  No evidence of colitis. 5.  There are metallic artifacts from a  left hip prosthesis. 6.  Small left hydrocele.  Original Report Authenticated By: Natasha Mead, M.D.      Assessment/Plan Active Problems:  Diarrhea/probable C. difficile - with abd pain,Nausea & vomiting -As discussed above in patient with C. Diff sick contact -Stool for C. difficile PCR sent out, contact isolation -Will empirically start on Flagyl given the high suspicion for C. Difficile -Supportive care IV fluids anti-emetics and pain management as needed  Orthostasis/volume depletion -Secondary to above, hydrate with IV fluids -follow and recheck. -hold Maxzide GERD (gastroesophageal reflux disease) -Will place on Pepcid Sleep apnea -CPAP per RT Dementia -Continue aricept History of hypertension -.hold off Maxzide as above given the volume depletion/orthostasis, monitor blood pressures and further treat accordingly. Ring-like cecal density-per CT scan -? Adherent stool, cannot rule out cecal mass -Discussed with patient and family/wife present and patient does not want a colonoscopy at this time, and declined  colonoscopy 6-8 months ago when it was offered by his outpatient gastroenterologist. Family/pt to inform MD if he changes his mind. Kela Millin, MD  [facility] Pager [phone]  If 7PM-7AM, please contact night-coverage [email or web address] Password [facility] 02/20/2012, 7:21 PM

## 2012-02-20 NOTE — ED Notes (Signed)
attenpted to void, unable, was not dizzy when he stood to void

## 2012-02-21 DIAGNOSIS — R197 Diarrhea, unspecified: Secondary | ICD-10-CM

## 2012-02-21 DIAGNOSIS — I951 Orthostatic hypotension: Secondary | ICD-10-CM

## 2012-02-21 DIAGNOSIS — K219 Gastro-esophageal reflux disease without esophagitis: Secondary | ICD-10-CM

## 2012-02-21 DIAGNOSIS — E869 Volume depletion, unspecified: Secondary | ICD-10-CM

## 2012-02-21 LAB — BASIC METABOLIC PANEL
BUN: 17 mg/dL (ref 6–23)
Chloride: 111 mEq/L (ref 96–112)
GFR calc Af Amer: 77 mL/min — ABNORMAL LOW (ref >90)
GFR calc non Af Amer: 66 mL/min — ABNORMAL LOW (ref >90)
Potassium: 3 mEq/L — ABNORMAL LOW (ref 3.5–5.1)

## 2012-02-21 LAB — URINALYSIS, ROUTINE W REFLEX MICROSCOPIC
Bilirubin Urine: NEGATIVE
Hgb urine dipstick: NEGATIVE
Ketones, ur: 15 mg/dL — AB
Nitrite: NEGATIVE

## 2012-02-21 LAB — CLOSTRIDIUM DIFFICILE BY PCR: Toxigenic C. Difficile by PCR: NEGATIVE

## 2012-02-21 MED ORDER — POTASSIUM CHLORIDE CRYS ER 20 MEQ PO TBCR
40.0000 meq | EXTENDED_RELEASE_TABLET | ORAL | Status: AC
  Administered 2012-02-21 (×3): 40 meq via ORAL
  Filled 2012-02-21 (×3): qty 2

## 2012-02-21 MED ORDER — LOPERAMIDE HCL 2 MG PO CAPS
4.0000 mg | ORAL_CAPSULE | Freq: Once | ORAL | Status: AC
  Administered 2012-02-22: 4 mg via ORAL
  Filled 2012-02-21: qty 2

## 2012-02-21 NOTE — Progress Notes (Signed)
Subjective: Still with diarrhea but decreasing, no further N/V Objective: Vital signs in last 24 hours: Temp:  [97.3 F (36.3 C)-98.7 F (37.1 C)] 98.1 F (36.7 C) (06/09 0834) Pulse Rate:  [62-100] 63  (06/09 0834) Resp:  [7-18] 18  (06/09 0834) BP: (84-138)/(60-88) 115/70 mmHg (06/09 0834) SpO2:  [92 %-96 %] 96 % (06/09 0834) Weight:  [107.9 kg (237 lb 14 oz)] 107.9 kg (237 lb 14 oz) (06/08 1850) Last BM Date: 02/21/12 Intake/Output from previous day: 06/08 0701 - 06/09 0700 In: 1147.8 [P.O.:200; I.V.:947.8] Out: 2300 [Urine:2300] Intake/Output this shift: Total I/O In: 220 [I.V.:220] Out: -     General Appearance:    Alert, cooperative, no distress, appears stated age  Lungs:     Clear to auscultation bilaterally, respirations unlabored   Heart:    Regular rate and rhythm, S1 and S2 normal, no murmur, rub   or gallop  Abdomen:     Soft,mild diffuse tenderness, bowel sounds present,    no masses, no organomegaly  Extremities:   Extremities normal, atraumatic, no cyanosis or edema  Neurologic:   CNII-XII intact, normal strength, nofocal      Weight change:   Intake/Output Summary (Last 24 hours) at 02/21/12 1005 Last data filed at 02/21/12 0800  Gross per 24 hour  Intake 1367.83 ml  Output   2300 ml  Net -932.17 ml    Lab Results:   Basename 02/21/12 0613 02/20/12 1347  NA 143 140  K 3.0* 3.5  CL 111 104  CO2 25 24  GLUCOSE 96 152*  BUN 17 22  CREATININE 1.05 1.05  CALCIUM 7.7* 8.9    Basename 02/20/12 1347  WBC 7.8  HGB 15.1  HCT 42.2  PLT 133*  MCV 94.8   PT/INR No results found for this basename: LABPROT:2,INR:2 in the last 72 hours ABG No results found for this basename: PHART:2,PCO2:2,PO2:2,HCO3:2 in the last 72 hours  Micro Results: No results found for this or any previous visit (from the past 240 hour(s)). Studies/Results: Ct Abdomen Pelvis W Contrast  02/20/2012  *RADIOLOGY REPORT*  Clinical Data: Left upper quadrant pain  CT  ABDOMEN AND PELVIS WITH CONTRAST  Technique:  Multidetector CT imaging of the abdomen and pelvis was performed following the standard protocol during bolus administration of intravenous contrast.  Contrast: OMNIPAQUE IOHEXOL 300 MG/ML  SOLN  Comparison: None.  Findings: Lung bases are unremarkable.  Sagittal images of the spine shows osteopenia and mild degenerative changes lumbar spine.  Enhanced liver is unremarkable.  The patient is status post cholecystectomy.  There is small hiatal hernia.  There is small paraesophageal hernia containing fluid measures about 3.9 cm.  Tortuous splenic artery with atherosclerotic calcifications. Spleen, pancreas and adrenal glands are unremarkable.  Kidneys are symmetrical in size and enhancement without evidence of focal mass.  Mild distended small bowel loops are noted with fluid. Mild ileus cannot be excluded.  No air-fluid levels are noted to suggest small bowel obstruction.  There is a small intraluminal lipoma in a  small bowel loop in axial image 45 measures 1.2 cm.  Again there is no evidence of small bowel obstruction.  Small umbilical hernia containing fat without evidence of acute complication.  Fluid is noted in the colon.  In axial image 59 there is a ring- like enhancement of the mucosa in the cecum with concentric appearance.  This is confirmed on the sagittal image 54.  Although findings may be due to adherent stool I cannot exclude  a cecal mass.  Follow-up colonoscopy is recommended.  There is no evidence of colitis.  No pericolonic inflammation is noted.  There are metallic artifacts from left hip prosthesis. Small left hydrocele is noted.  No destructive bony lesions are noted within pelvis.  The urinary bladder is unremarkable. Prostate gland measures 4.2 x 4.7 cm.  CBD measures 1.1 cm in diameter.  Delayed renal images shows bilateral renal symmetrical excretion.  Bilateral visualized proximal ureter is unremarkable.  IMPRESSION:  1. 1.  Small hiatal  hernia is noted.  Small paraesophageal hernia containing fluid is noted.  2.  Status post cholecystectomy.  3.  Fluid and mild distended small bowel loops without air fluid levels to suggest small bowel obstruction.  Mild ileus cannot be excluded. A focal intraluminal small bowel lipoma without evidence of acute complication 3.  There is a ring-like high density material or enhancement in the cecum in axial image 59.  Although findings may be due to adherent stool a cecal mass cannot be excluded.  Follow-up colonoscopy is recommended.  4.  No evidence of colitis. 5.  There are metallic artifacts from a  left hip prosthesis. 6.  Small left hydrocele.  Original Report Authenticated By: Natasha Mead, M.D.   Medications: Scheduled Meds:   . donepezil  10 mg Oral QHS  . enoxaparin  40 mg Subcutaneous Q24H  . famotidine (PEPCID) IV  20 mg Intravenous Q12H  . metronidazole  500 mg Intravenous Q8H  . ondansetron  4 mg Intravenous Once  . pneumococcal 23 valent vaccine  0.5 mL Intramuscular Tomorrow-1000  . potassium chloride  40 mEq Oral Q4H  . sodium chloride  500 mL Intravenous Once  . sodium chloride  500 mL Intravenous Once  . sodium chloride  500 mL Intravenous Once  . DISCONTD: sodium chloride   Intravenous STAT  . DISCONTD: antiseptic oral rinse  15 mL Mouth Rinse q12n4p  . DISCONTD: chlorhexidine  15 mL Mouth Rinse BID   Continuous Infusions:   . sodium chloride 110 mL/hr at 02/21/12 0328   PRN Meds:.acetaminophen, acetaminophen, iohexol, morphine injection, ondansetron (ZOFRAN) IV, ondansetron, DISCONTD: ondansetron (ZOFRAN) IV Assessment/Plan: Diarrhea/probable C. difficile - with abd pain,Nausea & vomiting  -As discussed above in patient with C. Diff sick contact  -await C. difficile PCR, contact isolation  -continue empiric Flagyl given the high suspicion for C. Difficile pending results -Supportive care IV fluids anti-emetics and pain management as needed  Orthostasis/volume depletion   -improving with hydration -holding Maxzide  GERD (gastroesophageal reflux disease)  -continue Pepcid  Sleep apnea  -CPAP per RT  Dementia  -Continue aricept  History of hypertension  -.hold off Maxzide as above given the volume depletion/orthostasis, monitor blood pressures and further treat accordingly.  Ring-like cecal density-per CT scan  -? Adherent stool, cannot rule out cecal mass  -Discussed with patient and family/wife present on admit and he did not want a colonoscopy, and declined colonoscopy 6-8 months ago when it was offered by his outpatient gastroenterologist.    -F/u with Family/pt in am regarding final decision regarding consulting GI  . Hypokalemia -replace k  LOS: 1 day   Loralai Eisman C 02/21/2012, 10:05 AM

## 2012-02-22 ENCOUNTER — Telehealth: Payer: Self-pay | Admitting: Family Medicine

## 2012-02-22 DIAGNOSIS — I951 Orthostatic hypotension: Secondary | ICD-10-CM

## 2012-02-22 DIAGNOSIS — R197 Diarrhea, unspecified: Secondary | ICD-10-CM

## 2012-02-22 DIAGNOSIS — K219 Gastro-esophageal reflux disease without esophagitis: Secondary | ICD-10-CM

## 2012-02-22 DIAGNOSIS — E869 Volume depletion, unspecified: Secondary | ICD-10-CM

## 2012-02-22 LAB — BASIC METABOLIC PANEL
CO2: 19 mEq/L (ref 19–32)
Calcium: 7.7 mg/dL — ABNORMAL LOW (ref 8.4–10.5)
Chloride: 112 mEq/L (ref 96–112)
Creatinine, Ser: 1.01 mg/dL (ref 0.50–1.35)
GFR calc Af Amer: 81 mL/min — ABNORMAL LOW (ref >90)
Sodium: 141 mEq/L (ref 135–145)

## 2012-02-22 MED ORDER — METRONIDAZOLE 500 MG PO TABS
500.0000 mg | ORAL_TABLET | Freq: Three times a day (TID) | ORAL | Status: DC
  Administered 2012-02-22 – 2012-02-23 (×4): 500 mg via ORAL
  Filled 2012-02-22 (×6): qty 1

## 2012-02-22 MED ORDER — POTASSIUM CHLORIDE CRYS ER 20 MEQ PO TBCR
40.0000 meq | EXTENDED_RELEASE_TABLET | ORAL | Status: AC
  Administered 2012-02-22 (×3): 40 meq via ORAL
  Filled 2012-02-22 (×2): qty 2

## 2012-02-22 MED ORDER — FAMOTIDINE 20 MG PO TABS
20.0000 mg | ORAL_TABLET | Freq: Two times a day (BID) | ORAL | Status: DC
  Administered 2012-02-22 – 2012-02-23 (×2): 20 mg via ORAL
  Filled 2012-02-22 (×3): qty 1

## 2012-02-22 MED ORDER — POTASSIUM CHLORIDE IN NACL 40-0.9 MEQ/L-% IV SOLN
INTRAVENOUS | Status: DC
  Administered 2012-02-22 – 2012-02-23 (×2): via INTRAVENOUS
  Filled 2012-02-22 (×4): qty 1000

## 2012-02-22 MED ORDER — POTASSIUM CHLORIDE CRYS ER 20 MEQ PO TBCR: 20.0000 meq | EXTENDED_RELEASE_TABLET | Freq: Two times a day (BID) | ORAL | Status: DC

## 2012-02-22 NOTE — Progress Notes (Addendum)
Subjective: The patient is still having diarrhea 4 episodes at 4 AM this  Objective: Vital signs in last 24 hours: Filed Vitals:   02/21/12 2055 02/22/12 0445 02/22/12 0447 02/22/12 0448  BP: 116/68 111/69 125/80 100/62  Pulse: 72 66 66 66  Temp: 100.8 F (38.2 C) 98.4 F (36.9 C)    TempSrc: Oral Oral    Resp: 18 18    Height:      Weight:      SpO2: 93% 94%      Intake/Output Summary (Last 24 hours) at 02/22/12 1307 Last data filed at 02/22/12 0900  Gross per 24 hour  Intake   3210 ml  Output      2 ml  Net   3208 ml    Weight change:   General Appearance:  Alert, cooperative, no distress, appears stated age   Lungs:  Clear to auscultation bilaterally, respirations unlabored   Heart:  Regular rate and rhythm, S1 and S2 normal, no murmur, rub  or gallop   Abdomen:  Soft,mild diffuse tenderness, bowel sounds present,  no masses, no organomegaly   Extremities:  Extremities normal, atraumatic, no cyanosis or edema   Neurologic:  CNII-XII intact, normal strength, nofocal      Lab Results: Results for orders placed during the hospital encounter of 02/20/12 (from the past 24 hour(s))  BASIC METABOLIC PANEL     Status: Abnormal   Collection Time   02/22/12  5:00 AM      Component Value Range   Sodium 141  135 - 145 (mEq/L)   Potassium 3.3 (*) 3.5 - 5.1 (mEq/L)   Chloride 112  96 - 112 (mEq/L)   CO2 19  19 - 32 (mEq/L)   Glucose, Bld 100 (*) 70 - 99 (mg/dL)   BUN 13  6 - 23 (mg/dL)   Creatinine, Ser 9.14  0.50 - 1.35 (mg/dL)   Calcium 7.7 (*) 8.4 - 10.5 (mg/dL)   GFR calc non Af Amer 70 (*) >90 (mL/min)   GFR calc Af Amer 81 (*) >90 (mL/min)     Micro: Recent Results (from the past 240 hour(s))  CLOSTRIDIUM DIFFICILE BY PCR     Status: Normal   Collection Time   02/21/12  8:05 AM      Component Value Range Status Comment   C difficile by pcr NEGATIVE  NEGATIVE  Final     Studies/Results: Ct Abdomen Pelvis W Contrast  02/20/2012  *RADIOLOGY REPORT*  Clinical  Data: Left upper quadrant pain  CT ABDOMEN AND PELVIS WITH CONTRAST  Technique:  Multidetector CT imaging of the abdomen and pelvis was performed following the standard protocol during bolus administration of intravenous contrast.  Contrast: OMNIPAQUE IOHEXOL 300 MG/ML  SOLN  Comparison: None.  Findings: Lung bases are unremarkable.  Sagittal images of the spine shows osteopenia and mild degenerative changes lumbar spine.  Enhanced liver is unremarkable.  The patient is status post cholecystectomy.  There is small hiatal hernia.  There is small paraesophageal hernia containing fluid measures about 3.9 cm.  Tortuous splenic artery with atherosclerotic calcifications. Spleen, pancreas and adrenal glands are unremarkable.  Kidneys are symmetrical in size and enhancement without evidence of focal mass.  Mild distended small bowel loops are noted with fluid. Mild ileus cannot be excluded.  No air-fluid levels are noted to suggest small bowel obstruction.  There is a small intraluminal lipoma in a  small bowel loop in axial image 45 measures 1.2 cm.  Again there  is no evidence of small bowel obstruction.  Small umbilical hernia containing fat without evidence of acute complication.  Fluid is noted in the colon.  In axial image 59 there is a ring- like enhancement of the mucosa in the cecum with concentric appearance.  This is confirmed on the sagittal image 54.  Although findings may be due to adherent stool I cannot exclude a cecal mass.  Follow-up colonoscopy is recommended.  There is no evidence of colitis.  No pericolonic inflammation is noted.  There are metallic artifacts from left hip prosthesis. Small left hydrocele is noted.  No destructive bony lesions are noted within pelvis.  The urinary bladder is unremarkable. Prostate gland measures 4.2 x 4.7 cm.  CBD measures 1.1 cm in diameter.  Delayed renal images shows bilateral renal symmetrical excretion.  Bilateral visualized proximal ureter is unremarkable.   IMPRESSION:  1. 1.  Small hiatal hernia is noted.  Small paraesophageal hernia containing fluid is noted.  2.  Status post cholecystectomy.  3.  Fluid and mild distended small bowel loops without air fluid levels to suggest small bowel obstruction.  Mild ileus cannot be excluded. A focal intraluminal small bowel lipoma without evidence of acute complication 3.  There is a ring-like high density material or enhancement in the cecum in axial image 59.  Although findings may be due to adherent stool a cecal mass cannot be excluded.  Follow-up colonoscopy is recommended.  4.  No evidence of colitis. 5.  There are metallic artifacts from a  left hip prosthesis. 6.  Small left hydrocele.  Original Report Authenticated By: Natasha Mead, M.D.    Medications:  Scheduled Meds:   . donepezil  10 mg Oral QHS  . enoxaparin  40 mg Subcutaneous Q24H  . famotidine (PEPCID) IV  20 mg Intravenous Q12H  . loperamide  4 mg Oral Once  . metroNIDAZOLE  500 mg Oral Q8H  . potassium chloride  40 mEq Oral Q4H  . potassium chloride  40 mEq Oral Q4H  . DISCONTD: metronidazole  500 mg Intravenous Q8H  . DISCONTD: potassium chloride  20 mEq Oral BID   Continuous Infusions:   . 0.9 % NaCl with KCl 40 mEq / L 75 mL/hr at 02/22/12 1125  . DISCONTD: sodium chloride 110 mL/hr at 02/22/12 0214   PRN Meds:.acetaminophen, acetaminophen, morphine injection, ondansetron (ZOFRAN) IV, ondansetron   Assessment: Active Problems:  GERD (gastroesophageal reflux disease)  Sleep apnea  Nausea & vomiting  Diarrhea  Orthostasis   Plan: #1 diarrhea ruled out for C. difficile colitis Order stool cultures the patient is still having diarrhea Change Flagyl to by mouth Continue IV fluids anti-emetics May consider outpatient colonoscopy  #2 nausea vomiting are resolved change to by mouth Flagyl Advanced diet  #3 orthostatic hypotension holding Maxzide continue IV hydration  #4 GERD continue PPI  #5 Hypertension holding off on  Maxzide  #6 hypokalemia replete  Disposition anticipate discharge tomorrow diarrhea improved  LOS: 2 days   [facility] 02/22/2012, 1:07 PM

## 2012-02-22 NOTE — Telephone Encounter (Signed)
Triage Record Num: [id number] Operator: Chevis Pretty Patient Name: John Summers Call Date & Time: 02/20/2012 11:01:39AM Patient Phone: [phone] PCP: Hannah Beat Patient Gender: Male PCP Fax : [phone] Patient DOB: [birth year] Practice Name: [facility] Reason for Call: Caller: Judith/Spouse; PCP: Juleen China.; CB#: [phone]; Call regarding Wife has C Diff and is afraid her husband has the same thing; caller states she was discharged from hospital/[facility] 02/19/12 PM; taking flagyl. States patient is now vomiting, which she had as well. Wants to know if he should be taking the same antibiotic as she is. Vomiting onset 02/20/12 AM. States has had frequent vomiting episodes x 8 hours and is unable to keep fluids down. Per protocol, advised being seen within 4 hours; appt sched 1230 02/20/12 at [facility] office. Protocol(s) Used: Nausea or Vomiting Recommended Outcome per Protocol: See Provider within 4 hours Reason for Outcome: Continuous or repeated vomiting for more than 8 hours AND unable to keep any fluids down Care Advice: ~ Another adult should drive. ~ Avoid drinking alcoholic or caffeinated beverages. ~ SYMPTOM / CONDITION MANAGEMENT ~ CAUTIONS ~ List, or take, all current prescription(s), nonprescription or alternative medication(s) to provider for evaluation. Go to the ED if you have developed signs and symptoms of dehydration such as very dry mouth and tongue; increased pulse rate at rest; no urine output for 8 hours or more; increasing weakness or drowsiness, or lightheadedness when trying to sit upright or standing. ~ Vomiting Care Advice: - Do not eat solid foods until vomiting subsides. - Begin taking fluids by sucking on ice chips or popsicles or taking sips of cool clear, nonprescription oral rehydration solution). - Gradually drink larger amounts of these fluids so that you are drinking six to eight 8 oz. (.2 liter) of fluids a day. - Keep  activity to a minimum. - After vomiting subsides, eat smaller, more frequent meals of easily digested foods such as crackers, toast, bananas, rice, cooked cereal, applesauce, broth, baked or mashed potatoes, chicken or [place] without skin. Eat slowly. - Take fluids 30 minutes before or 60 minutes after meals. - Avoid high fat, highly seasoned, high fiber or high sugar content foods. - Avoid extremely hot or cold foods. - Do not take pain medication (such as aspirin, NSAIDs) while nauseated or vomiting. - Consult your provider for advice regarding continuing prescription medication. - Rest as much as possible in a sitting or in a propped lying position. Do not lie flat for at least 2 hours after eating. ~ 02/20/2012 11:10:46AM Page 1 of 1 [id number]

## 2012-02-23 DIAGNOSIS — E869 Volume depletion, unspecified: Secondary | ICD-10-CM

## 2012-02-23 DIAGNOSIS — I951 Orthostatic hypotension: Secondary | ICD-10-CM

## 2012-02-23 DIAGNOSIS — R197 Diarrhea, unspecified: Secondary | ICD-10-CM

## 2012-02-23 DIAGNOSIS — M79609 Pain in unspecified limb: Secondary | ICD-10-CM

## 2012-02-23 DIAGNOSIS — M7989 Other specified soft tissue disorders: Secondary | ICD-10-CM

## 2012-02-23 DIAGNOSIS — K219 Gastro-esophageal reflux disease without esophagitis: Secondary | ICD-10-CM

## 2012-02-23 LAB — BASIC METABOLIC PANEL
BUN: 9 mg/dL (ref 6–23)
CO2: 21 mEq/L (ref 19–32)
Calcium: 7.6 mg/dL — ABNORMAL LOW (ref 8.4–10.5)
Glucose, Bld: 118 mg/dL — ABNORMAL HIGH (ref 70–99)
Sodium: 144 mEq/L (ref 135–145)

## 2012-02-23 MED ORDER — POTASSIUM CHLORIDE CRYS ER 20 MEQ PO TBCR
20.0000 meq | EXTENDED_RELEASE_TABLET | Freq: Every day | ORAL | Status: DC
  Administered 2012-02-23: 20 meq via ORAL
  Filled 2012-02-23: qty 1

## 2012-02-23 MED ORDER — METRONIDAZOLE 500 MG PO TABS: 500.0000 mg | ORAL_TABLET | Freq: Three times a day (TID) | ORAL | Status: AC

## 2012-02-23 MED ORDER — DOXYCYCLINE HYCLATE 50 MG PO CAPS: 100.0000 mg | ORAL_CAPSULE | Freq: Two times a day (BID) | ORAL | Status: AC

## 2012-02-23 NOTE — Progress Notes (Signed)
VASCULAR LAB PRELIMINARY  PRELIMINARY  PRELIMINARY  PRELIMINARY  Right upper extremity venous duplex completed.    Preliminary report:  Right:  No evidence of DVT or superficial thrombosis.    Terance Hart, RVT 02/23/2012, 4:41 PM

## 2012-02-23 NOTE — Discharge Summary (Signed)
Physician Discharge Summary  John Summers MRN: [id number] DOB/AGE: 76-02-1933 76 y.o.  PCP: Hannah Beat, MD, MD   Admit date: 02/20/2012 Discharge date: 02/23/2012  Discharge Diagnoses:  Right upper extremity pains from IV infiltration,  GERD (gastroesophageal reflux disease)  Sleep apnea  Nausea & vomiting  Diarrhea rule out for C. difficile  Orthostasis Hypertension Hypokalemia  Medication List  As of 02/23/2012  9:56 AM   TAKE these medications         donepezil 10 MG tablet   Commonly known as: ARICEPT   Take 10 mg by mouth at bedtime.      doxycycline 50 MG capsule   Commonly known as: VIBRAMYCIN   Take 2 capsules (100 mg total) by mouth 2 (two) times daily.      metroNIDAZOLE 500 MG tablet   Commonly known as: FLAGYL   Take 1 tablet (500 mg total) by mouth every 8 (eight) hours.      omeprazole 20 MG capsule   Commonly known as: PRILOSEC   Take 20 mg by mouth daily.      triamterene-hydrochlorothiazide 37.5-25 MG per tablet   Commonly known as: MAXZIDE-25   Take 0.5 each (0.5 tablets total) by mouth daily.            Discharge Condition: To be discharged home Disposition: Final discharge disposition not confirmed   Consults: None   Significant Diagnostic Studies: Ct Abdomen Pelvis W Contrast  02/20/2012  *RADIOLOGY REPORT*  Clinical Data: Left upper quadrant pain  CT ABDOMEN AND PELVIS WITH CONTRAST  Technique:  Multidetector CT imaging of the abdomen and pelvis was performed following the standard protocol during bolus administration of intravenous contrast.  Contrast: OMNIPAQUE IOHEXOL 300 MG/ML  SOLN  Comparison: None.  Findings: Lung bases are unremarkable.  Sagittal images of the spine shows osteopenia and mild degenerative changes lumbar spine.  Enhanced liver is unremarkable.  The patient is status post cholecystectomy.  There is small hiatal hernia.  There is small paraesophageal hernia containing fluid measures about 3.9 cm.   Tortuous splenic artery with atherosclerotic calcifications. Spleen, pancreas and adrenal glands are unremarkable.  Kidneys are symmetrical in size and enhancement without evidence of focal mass.  Mild distended small bowel loops are noted with fluid. Mild ileus cannot be excluded.  No air-fluid levels are noted to suggest small bowel obstruction.  There is a small intraluminal lipoma in a  small bowel loop in axial image 45 measures 1.2 cm.  Again there is no evidence of small bowel obstruction.  Small umbilical hernia containing fat without evidence of acute complication.  Fluid is noted in the colon.  In axial image 59 there is a ring- like enhancement of the mucosa in the cecum with concentric appearance.  This is confirmed on the sagittal image 54.  Although findings may be due to adherent stool I cannot exclude a cecal mass.  Follow-up colonoscopy is recommended.  There is no evidence of colitis.  No pericolonic inflammation is noted.  There are metallic artifacts from left hip prosthesis. Small left hydrocele is noted.  No destructive bony lesions are noted within pelvis.  The urinary bladder is unremarkable. Prostate gland measures 4.2 x 4.7 cm.  CBD measures 1.1 cm in diameter.  Delayed renal images shows bilateral renal symmetrical excretion.  Bilateral visualized proximal ureter is unremarkable.  IMPRESSION:  1. 1.  Small hiatal hernia is noted.  Small paraesophageal hernia containing fluid is noted.  2.  Status  post cholecystectomy.  3.  Fluid and mild distended small bowel loops without air fluid levels to suggest small bowel obstruction.  Mild ileus cannot be excluded. A focal intraluminal small bowel lipoma without evidence of acute complication 3.  There is a ring-like high density material or enhancement in the cecum in axial image 59.  Although findings may be due to adherent stool a cecal mass cannot be excluded.  Follow-up colonoscopy is recommended.  4.  No evidence of colitis. 5.  There are  metallic artifacts from a  left hip prosthesis. 6.  Small left hydrocele.  Original Report Authenticated By: Natasha Mead, M.D.     Microbiology: Recent Results (from the past 240 hour(s))  CLOSTRIDIUM DIFFICILE BY PCR     Status: Normal   Collection Time   02/21/12  8:05 AM      Component Value Range Status Comment   C difficile by pcr NEGATIVE  NEGATIVE  Final      Labs: Results for orders placed during the hospital encounter of 02/20/12 (from the past 48 hour(s))  BASIC METABOLIC PANEL     Status: Abnormal   Collection Time   02/22/12  5:00 AM      Component Value Range Comment   Sodium 141  135 - 145 (mEq/L)    Potassium 3.3 (*) 3.5 - 5.1 (mEq/L)    Chloride 112  96 - 112 (mEq/L)    CO2 19  19 - 32 (mEq/L)    Glucose, Bld 100 (*) 70 - 99 (mg/dL)    BUN 13  6 - 23 (mg/dL)    Creatinine, Ser 1.61  0.50 - 1.35 (mg/dL)    Calcium 7.7 (*) 8.4 - 10.5 (mg/dL)    GFR calc non Af Amer 70 (*) >90 (mL/min)    GFR calc Af Amer 81 (*) >90 (mL/min)   BASIC METABOLIC PANEL     Status: Abnormal   Collection Time   02/23/12  6:35 AM      Component Value Range Comment   Sodium 144  135 - 145 (mEq/L)    Potassium 3.9  3.5 - 5.1 (mEq/L)    Chloride 115 (*) 96 - 112 (mEq/L)    CO2 21  19 - 32 (mEq/L)    Glucose, Bld 118 (*) 70 - 99 (mg/dL)    BUN 9  6 - 23 (mg/dL)    Creatinine, Ser 0.96  0.50 - 1.35 (mg/dL)    Calcium 7.6 (*) 8.4 - 10.5 (mg/dL)    GFR calc non Af Amer 79 (*) >90 (mL/min)    GFR calc Af Amer >90  >90 (mL/min)      HPI : 78 year old white male with history of dementia, GERD and hypertension who presents with complaints of nausea vomiting diarrhea, that began at 2:30 AM. Of note is that his wife was just discharged from [facility] 0n 6/7  Following treatment for C. difficile colitis after she was admitted with similar symptoms. The patient states that he began having nausea vomiting and diarrhea at 2 AM along with abdominal pain. He described that the diarrhea has  watery, nonbloody and he had a 6-8 this is prior to admission. He has had epigastric and diffuse abdominal pain. He he described the pain in his epigastric area as a burning sensation. He vomited about 5-6 times nonbloody at the time of admission.  Per wife they initially went to the [facility] and were asked to come to the ED.  At the [facility] ED a CT scan of his abdomen and pelvis was done and fluid and mild distended small bowel loops without air-fluid levels to suggest small bowel obstruction, a ringlike high density material or enhancement in the cecum was noted-adherent stool versus cecal mass per radiologist. The patient was found to be orthostatic in the ED systolic blood pressure lying 161/09 and standing 84/62. He was bolused with IV fluids and stool sent was C. difficile and he is admitted for further evaluation and management.  HOSPITAL COURSE:  #1 diarrhea C. difficile was ruled out He was started empirically on IV Flagyl subsequently changed to by mouth with significant improvement in his diarrhea. His diet was advanced and he tolerated this well. CT findings were discussed with him and the patient declined colonoscopy during this hospitalization. Followup colonoscopy is recommended and this was reinforced multiple times during this admission. He will continue with by mouth Flagyl for another 10 days.  #2 hypokalemia replete in multiple times, patient to continue with oral potassium for another 5 days  #3 hypertension patient was found to have orthostatic hypotension upon presentation and was rehydrated with IV fluids, he is to resume his antihypertensive medications now  #4 right arm pain probably secondary to IV infiltration a Doppler of the right upper extremity has been ordered and is pending at this time. If negative the patient will be discharged home on a short duration of doxycycline for superficial cellulitis.   Discharge Exam:  Blood pressure 128/72, pulse 60,  temperature 98.7 F (37.1 C), temperature source Oral, resp. rate 18, height 5\' 10"  (1.778 m), weight 107.9 kg (237 lb 14 oz), SpO2 94.00%.  x3.  Head: Normocephalic and atraumatic  Mouth: no erythema or exudates, slightly dry MM  Eyes: PERRL, EOMI, conjunctivae normal, No scleral icterus.  Neck: Supple, Trachea midline normal ROM, No JVD, mass, thyromegaly, or carotid bruit present.  Cardiovascular: RRR, S1 normal, S2 normal, no MRG, pulses symmetric and intact bilaterally  Pulmonary/Chest: CTAB, no wheezes, rales, or rhonchi  Abdominal: Soft. Tender in the epigastrium and right lower quadrant, non-distended, bowel sounds are normal, no masses, organomegaly, or guarding present.   extremities: No cyanosis and no edema  Hematology: no cervical, inginal, or axillary adenopathy.  Neurological: Alert and appropriate, Strenght is normal and symmetric bilaterally, cranial nerve II-XII are grossly intact, no focal motor deficit.  Skin: Warm, dry and intact. No rash.  Psychiatric: Normal mood and affect. speech and behavior is normal.       Discharge Orders    Future Orders Please Complete By Expires   Diet - low sodium heart healthy      Increase activity slowly         Follow-up Information    Follow up with pcp. Schedule an appointment as soon as possible for a visit in 1 week. (post total followup)          Signed: Richarda Overlie 02/23/2012, 9:56 AM

## 2012-02-23 NOTE — Discharge Instructions (Signed)
Warm compresses to right upper extremity

## 2012-02-23 NOTE — Progress Notes (Signed)
Discharge instruction given. Patient and Patient wife at beside and wife verbalizes understanding. Iv removed Skin intact

## 2012-02-23 NOTE — Care Management Note (Signed)
    Page 1 of 1   02/23/2012     11:42:10 AM   CARE MANAGEMENT NOTE 02/23/2012  Patient:  John Summers, John Summers   Account Number:  [id number]  Date Initiated:  02/23/2012  Documentation initiated by:  Letha Cape  Subjective/Objective Assessment:   dx diarrhea  admit- lives with spouse, pta independent.     Action/Plan:   Anticipated DC Date:  02/23/2012   Anticipated DC Plan:  HOME/SELF CARE      DC Planning Services  CM consult      Choice offered to / List presented to:             Status of service:  Completed, signed off Medicare Important Message given?   (If response is "NO", the following Medicare IM given date fields will be blank) Date Medicare IM given:   Date Additional Medicare IM given:    Discharge Disposition:  HOME/SELF CARE  Per UR Regulation:  Reviewed for med. necessity/level of care/duration of stay  If discussed at Long Length of Stay Meetings, dates discussed:    Comments:  02/23/12 11:39 Letha Cape RN, BSN [phone] patient lives with spouse at home, pta independent.  No needs anticiapted.  NCM will continue to follow for dc needs.  Patient for dc today if doppler of arm ok.

## 2012-02-25 ENCOUNTER — Ambulatory Visit: Payer: 59 | Admitting: Family Medicine

## 2012-02-26 LAB — STOOL CULTURE

## 2012-02-29 ENCOUNTER — Ambulatory Visit (INDEPENDENT_AMBULATORY_CARE_PROVIDER_SITE_OTHER): Payer: 59 | Admitting: Family Medicine

## 2012-02-29 ENCOUNTER — Telehealth: Payer: Self-pay | Admitting: Gastroenterology

## 2012-02-29 ENCOUNTER — Encounter: Payer: Self-pay | Admitting: Family Medicine

## 2012-02-29 VITALS — BP 100/70 | HR 68 | Temp 97.5°F | Resp 20 | Ht 70.0 in | Wt 234.2 lb

## 2012-02-29 DIAGNOSIS — R5383 Other fatigue: Secondary | ICD-10-CM

## 2012-02-29 DIAGNOSIS — R197 Diarrhea, unspecified: Secondary | ICD-10-CM

## 2012-02-29 DIAGNOSIS — K6389 Other specified diseases of intestine: Secondary | ICD-10-CM

## 2012-02-29 DIAGNOSIS — R531 Weakness: Secondary | ICD-10-CM

## 2012-02-29 NOTE — Progress Notes (Signed)
Nature conservation officer at [facility] [address] [place] [address] Phone: [phone] Fax: [phone]   Patient Name: John Summers Date of Birth: 07-22-1933 Medical Record Number: [id number] Gender: male Date of Encounter: 02/29/2012  History of Present Illness:  John Summers is a 78 y.o. very pleasant male patient who presents with the following:  Pleasant mildly demented gentlemen who had some worsening sundowning in the hospital but was admitted with significant diarrhea, ruled out for C. Diff colitis and required IV hydration.   Now improving and gradually advancing diet.  Also found to have a cecal mass on CT in the hospital and declined colonoscopic evaluation at that time.   The patient previously had a colonoscopy by Dr. Markham Jordan in [place], but his wife sees Dr. Christella Hartigan.  Patient Active Problem List  Diagnosis  . GERD (gastroesophageal reflux disease)  . Pollen allergies  . Kidney stones  . Dementia  . Sleep apnea  . Unspecified essential hypertension  . Nausea & vomiting  . Diarrhea  . Orthostasis   Past Medical History  Diagnosis Date  . GERD (gastroesophageal reflux disease)   . Pollen allergies   . Kidney stones   . Dementia   . Sleep apnea   . Unspecified essential hypertension   . Thrombocytopenia     Dr. Orlie Dakin follows   Past Surgical History  Procedure Date  . Cholecystectomy 2011  . Tonsillectomy and adenoidectomy 1946  . Total hip arthroplasty     left  . Lithotripsy 2008   History  Substance Use Topics  . Smoking status: Former Games developer  . Smokeless tobacco: Not on file  . Alcohol Use: Yes   Family History  Problem Relation Age of Onset  . Cancer Mother     colon  . Heart disease Mother   . Hypertension Mother   . Alcohol abuse Father    Allergies  Allergen Reactions  . Chicken Allergy     unknown    Medication list has been reviewed and updated.  Prior to Admission medications   Medication Sig  Start Date End Date Taking? Authorizing Provider  donepezil (ARICEPT) 10 MG tablet Take 10 mg by mouth at bedtime.     Yes Historical Provider, MD  doxycycline (VIBRAMYCIN) 50 MG capsule Take 2 capsules (100 mg total) by mouth 2 (two) times daily. 02/23/12 03/01/12 Yes Richarda Overlie, MD  metroNIDAZOLE (FLAGYL) 500 MG tablet Take 1 tablet (500 mg total) by mouth every 8 (eight) hours. 02/23/12 03/04/12 Yes Richarda Overlie, MD  omeprazole (PRILOSEC) 20 MG capsule Take 20 mg by mouth daily.     Yes Historical Provider, MD  triamterene-hydrochlorothiazide (MAXZIDE-25) 37.5-25 MG per tablet Take 0.5 each (0.5 tablets total) by mouth daily. 12/16/11  Yes Hannah Beat, MD    Review of Systems:  ROS: GEN: Acute illness details above GI: Tolerating PO intake GU: maintaining adequate hydration and urination Pulm: No SOB Interactive and getting along well at home.  Otherwise, ROS is as per the HPI.   Physical Examination: Filed Vitals:   02/29/12 1055  BP: 100/70  Pulse: 68  Temp: 97.5 F (36.4 C)  Resp: 20   Filed Vitals:   02/29/12 1055  Height: 5\' 10"  (1.778 m)  Weight: 234 lb 4 oz (106.255 kg)   Body mass index is 33.61 kg/(m^2). Ideal Body Weight: Weight in (lb) to have BMI = 25: 173.9   GEN: WDWN, NAD, Non-toxic, A & O x 3 HEENT: Atraumatic, Normocephalic. Neck  supple. No masses, No LAD. Ears and Nose: No external deformity. CV: RRR, No M/G/R. No JVD. No thrill. No extra heart sounds. PULM: CTA B, no wheezes, crackles, rhonchi. No retractions. No resp. distress. No accessory muscle use. ABD: S, mild upper quadrant ttp, ND, +BS. No rebound. No HSM. EXTR: No c/c/e NEURO Normal gait.  PSYCH: Normally interactive. Conversant. Not depressed or anxious appearing.  Calm demeanor.    Assessment and Plan:  1. Diarrhea    2. Colonic mass  Ambulatory referral to Gastroenterology  3. Weakness     Feeling better and acute diarrhea improving.  Cecal mass, needs colonoscopy, he and wife  understand. They would prefer to see [facility], Dr. Christella Hartigan if possible for continuity.  Orders Today: Orders Placed This Encounter  Procedures  . Ambulatory referral to Gastroenterology    Referral Priority:  Routine    Referral Type:  Consultation    Referral Reason:  Specialty Services Required    Requested Specialty:  Gastroenterology    Number of Visits Requested:  1    Medications Today: No orders of the defined types were placed in this encounter.     Hannah Beat, MD

## 2012-02-29 NOTE — Telephone Encounter (Signed)
Left message on machine to call back  

## 2012-02-29 NOTE — Patient Instructions (Signed)
REFERRAL: GO THE THE FRONT ROOM AT THE ENTRANCE OF OUR CLINIC, NEAR CHECK IN. ASK FOR MARION. SHE WILL HELP YOU SET UP YOUR REFERRAL. DATE: TIME:  

## 2012-02-29 NOTE — Telephone Encounter (Signed)
Pt was given appt with Dr Christella Hartigan on 03/01/12 915 am Shirlee Limerick is aware and will call the pt

## 2012-03-01 ENCOUNTER — Encounter: Payer: Self-pay | Admitting: Gastroenterology

## 2012-03-01 ENCOUNTER — Ambulatory Visit (INDEPENDENT_AMBULATORY_CARE_PROVIDER_SITE_OTHER): Payer: 59 | Admitting: Gastroenterology

## 2012-03-01 VITALS — BP 110/62 | HR 68 | Ht 70.0 in | Wt 233.1 lb

## 2012-03-01 DIAGNOSIS — R933 Abnormal findings on diagnostic imaging of other parts of digestive tract: Secondary | ICD-10-CM

## 2012-03-01 DIAGNOSIS — R109 Unspecified abdominal pain: Secondary | ICD-10-CM

## 2012-03-01 DIAGNOSIS — R197 Diarrhea, unspecified: Secondary | ICD-10-CM

## 2012-03-01 MED ORDER — MOVIPREP 100 G PO SOLR: 1.0000 | ORAL | Status: DC

## 2012-03-01 NOTE — Progress Notes (Signed)
HPI: This is a    very pleasant 78 year old man whom I am meeting for the first time today. I take care of his wife as well.  He was recently admitted for a brief stay at the hospital when he presented with acute diarrhea and abdominal pain. His wife had just gotten over Clostridium difficile. He was actually negative for Clostridium difficile but was started on Flagyl in. Weight. He remains on Flagyl. His diarrhea has completely resolved. He is still somewhat sore and tender. He has been coughing a lot as well. A CT scan while he was hospitalized suggest an abnormal thickening of his cecum, question underlying mass.  He has a few more days of abx.  The diarrhea is generally gone.  Was going 8-10 times a day, now 1-2 times a day.  Still hurting under rib cage.   Has had 2 colonoscopies, Dr. Mechele Collin.  Last was 12 yearsa go.  Had polyps.  Declined repeat one 2 years ago.  Review of systems: Pertinent positive and negative review of systems were noted in the above HPI section. Complete review of systems was performed and was otherwise normal.    Past Medical History  Diagnosis Date  . GERD (gastroesophageal reflux disease)   . Pollen allergies   . Kidney stones   . Dementia   . Sleep apnea   . Unspecified essential hypertension   . Thrombocytopenia     Dr. Orlie Dakin follows  . Skin cancer     ears  . History of colon polyps   . History of gallstones   . Status post dilation of esophageal narrowing     Past Surgical History  Procedure Date  . Cholecystectomy 2011  . Tonsillectomy and adenoidectomy 1946  . Total hip arthroplasty     left  . Lithotripsy 2008  . Pilonidal cyst excision     Current Outpatient Prescriptions  Medication Sig Dispense Refill  . donepezil (ARICEPT) 10 MG tablet Take 10 mg by mouth at bedtime.        Marland Kitchen doxycycline (VIBRAMYCIN) 50 MG capsule Take 2 capsules (100 mg total) by mouth 2 (two) times daily.  14 capsule  0  . metroNIDAZOLE (FLAGYL) 500 MG  tablet Take 1 tablet (500 mg total) by mouth every 8 (eight) hours.  30 tablet  0  . omeprazole (PRILOSEC) 20 MG capsule Take 20 mg by mouth daily.        Marland Kitchen triamterene-hydrochlorothiazide (MAXZIDE-25) 37.5-25 MG per tablet Take 0.5 each (0.5 tablets total) by mouth daily.  45 tablet  3    Allergies as of 03/01/2012 - Review Complete 03/01/2012  Allergen Reaction Noted  . Chicken allergy  02/20/2012    Family History  Problem Relation Age of Onset  . Colon cancer Mother   . Heart disease Mother   . Hypertension Mother   . Alcohol abuse Father     History   Social History  . Marital Status: Married    Spouse Name: N/A    Number of Children: 2  . Years of Education: N/A   Occupational History  . retired     Retired, Print production planner, Counsellor   Social History Main Topics  . Smoking status: Former Smoker    Types: Pipe  . Smokeless tobacco: Never Used  . Alcohol Use: Yes     rarely  . Drug Use: No  . Sexually Active: Not on file   Other Topics Concern  . Not on file  Social History Narrative  . No narrative on file       Physical Exam: BP 110/62  Pulse 68  Ht 5\' 10"  (1.778 m)  Wt 233 lb 2 oz (105.745 kg)  BMI 33.45 kg/m2 Constitutional: generally well-appearing Psychiatric: alert and oriented x3 Eyes: extraocular movements intact Mouth: oral pharynx moist, no lesions Neck: supple no lymphadenopathy Cardiovascular: heart regular rate and rhythm Lungs: clear to auscultation bilaterally Abdomen: soft,  mildly tender throughout , nondistended, no obvious ascites, no peritoneal signs, normal bowel sounds Extremities: no lower extremity edema bilaterally Skin: no lesions on visible extremities    Assessment and plan: 78 y.o. male with  resolved diarrheal illness, possibly Clostridium difficile even though the testing was negative, abnormal finding on CT  think we should proceed with colonoscopy at his soonest convenience. I reviewed the images  myself of his recent CT there does appear to be an abnormal thickening in his cecum. I do suspect he has an underlying neoplasm from the looks of it.

## 2012-03-01 NOTE — Patient Instructions (Addendum)
You will be set up for a colonoscopy ([facility]) for abdominal pain, abnormal Cecum on CT.

## 2012-03-08 ENCOUNTER — Other Ambulatory Visit: Payer: 59

## 2012-03-08 ENCOUNTER — Encounter: Payer: Self-pay | Admitting: Gastroenterology

## 2012-03-08 ENCOUNTER — Telehealth: Payer: Self-pay

## 2012-03-08 ENCOUNTER — Ambulatory Visit (AMBULATORY_SURGERY_CENTER): Payer: 59 | Admitting: Gastroenterology

## 2012-03-08 VITALS — BP 136/76 | HR 68 | Temp 97.6°F | Resp 15 | Ht 70.0 in | Wt 233.0 lb

## 2012-03-08 DIAGNOSIS — K6389 Other specified diseases of intestine: Secondary | ICD-10-CM

## 2012-03-08 DIAGNOSIS — C180 Malignant neoplasm of cecum: Secondary | ICD-10-CM

## 2012-03-08 DIAGNOSIS — R933 Abnormal findings on diagnostic imaging of other parts of digestive tract: Secondary | ICD-10-CM

## 2012-03-08 DIAGNOSIS — R197 Diarrhea, unspecified: Secondary | ICD-10-CM

## 2012-03-08 DIAGNOSIS — K5730 Diverticulosis of large intestine without perforation or abscess without bleeding: Secondary | ICD-10-CM

## 2012-03-08 DIAGNOSIS — R109 Unspecified abdominal pain: Secondary | ICD-10-CM

## 2012-03-08 DIAGNOSIS — D126 Benign neoplasm of colon, unspecified: Secondary | ICD-10-CM

## 2012-03-08 MED ORDER — SODIUM CHLORIDE 0.9 % IV SOLN: 500.0000 mL | INTRAVENOUS | Status: DC

## 2012-03-08 NOTE — Progress Notes (Signed)
Pt has a harsh cough.  Rates chest discomfort as a "9".  Pt and wife states that he has had this cough and discomfort for "years" and that he has "bad reflux" and takes Prilosec for it.  Dr. Christella Hartigan made aware of this.

## 2012-03-08 NOTE — Op Note (Signed)
[facility] [address] [place], [place]  [address]  COLONOSCOPY PROCEDURE REPORT PATIENT:  John Summers, John Summers  MR#:  [id number] BIRTHDATE:  Jun 01, 1933, 79 yrs. old  GENDER:  male ENDOSCOPIST:  Rachael Fee, MD REF. BY:  Karleen Hampshire T. Copland, M.D. PROCEDURE DATE:  03/08/2012 PROCEDURE:  Colonoscopy with biopsy, Colonoscopy with snare polypectomy ASA CLASS:  Class III INDICATIONS:  recent diarrheal illness, had CT scan that showed abnormal cecum MEDICATIONS:   Fentanyl 50 mcg IV, These medications were titrated to patient response per physician's verbal order, Versed 5 mg IV DESCRIPTION OF PROCEDURE:   After the risks benefits and alternatives of the procedure were thoroughly explained, informed consent was obtained.  Digital rectal exam was performed and revealed no rectal masses.   The LB CF-[id number] endoscope was introduced through the anus and advanced to the cecum, which was identified by both the appendix and ileocecal valve, without limitations.  The quality of the prep was good..  The instrument was then slowly withdrawn as the colon was fully examined. <<PROCEDUREIMAGES>> FINDINGS:  There was a clearly malignant, partially circumferntial 3-4cm mass in cecum. The mass did not involve IC valve or appendiceal orifice. Biopsies taken and sent to pathology (see image1, image2, and image4).  Two sessile polyps were found, removed and sent to pathology (jar 2). One was 1cm across, located in ascending colon, removed with snare/cautery. The other was 6mm, located in descending segment, removed with cold snare (see image7).  Mild diverticulosis was found in the sigmoid to descending colon segments (see image8).  Internal and external Hemorrhoids were found.  This was otherwise a normal examination of the colon (see image9).   Retroflexion was not performed. COMPLICATIONS:  None  ENDOSCOPIC IMPRESSION: 1. Malignant mass in cecum, biopsied 2. Two polyps, both  removed and both sent to pathology 3. Diverticulosis left sided 4. Internal and external hemorrhoids  RECOMMENDATIONS: 1. Stagingworkup, my office will arrange: CEA level; CT scan chest with IV contrast to check for metastatic disease 2. My office will arrange referral to surgery for segmental colectomy, expedite.  ______________________________ Rachael Fee, MD  n. eSIGNED:   Rachael Fee at 03/08/2012 10:53 AM  [place], [phone]

## 2012-03-08 NOTE — Telephone Encounter (Signed)
Message copied by Donata Duff on Tue Mar 08, 2012  1:32 PM ------      Message from: Rachael Fee      Created: Tue Mar 08, 2012  1:12 PM      Regarding: RE: Referral       I think that is a fair time frame.            Dan            ----- Message -----         From: Donata Duff, CMA         Sent: 03/08/2012   1:00 PM           To: Rachael Fee, MD      Subject: FW: Referral                                             Dr Christella Hartigan is this soon enough?      ----- Message -----         From: Marnette Burgess         Sent: 03/08/2012  12:18 PM           To: Donata Duff, CMA      Subject: Referral                                                 Patient scheduled to see Dr. Almond Lint on 03/23/12 @ 9:45am, arrive at 9:15am.  If you have any questions please call [phone].            Thank You,      Elane Fritz      ----- Message -----         From: Donata Duff, CMA         Sent: 03/08/2012  11:40 AM           To: Marnette Burgess            Pt needs appt for segmental colectomy for malignant colon mass.  Dr Christella Hartigan request ASAP

## 2012-03-08 NOTE — Patient Instructions (Addendum)
YOU HAD AN ENDOSCOPIC PROCEDURE TODAY AT THE [facility]: Refer to the procedure report that was given to you for any specific questions about what was found during the examination.  If the procedure report does not answer your questions, please call your gastroenterologist to clarify.  If you requested that your care partner not be given the details of your procedure findings, then the procedure report has been included in a sealed envelope for you to review at your convenience later.  YOU SHOULD EXPECT: Some feelings of bloating in the abdomen. Passage of more gas than usual.  Walking can help get rid of the air that was put into your GI tract during the procedure and reduce the bloating. If you had a lower endoscopy (such as a colonoscopy or flexible sigmoidoscopy) you may notice spotting of blood in your stool or on the toilet paper. If you underwent a bowel prep for your procedure, then you may not have a normal bowel movement for a few days.  DIET: Your first meal following the procedure should be a light meal and then it is ok to progress to your normal diet.  A half-sandwich or bowl of soup is an example of a good first meal.  Heavy or fried foods are harder to digest and may make you feel nauseous or bloated.  Likewise meals heavy in dairy and vegetables can cause extra gas to form and this can also increase the bloating.  Drink plenty of fluids but you should avoid alcoholic beverages for 24 hours.  ACTIVITY: Your care partner should take you home directly after the procedure.  You should plan to take it easy, moving slowly for the rest of the day.  You can resume normal activity the day after the procedure however you should NOT DRIVE or use heavy machinery for 24 hours (because of the sedation medicines used during the test).    SYMPTOMS TO REPORT IMMEDIATELY: A gastroenterologist can be reached at any hour.  During normal business hours, 8:30 AM to 5:00 PM Monday through Friday,  call [phone].  After hours and on weekends, please call the GI answering service at [phone] who will take a message and have the physician on call contact you.   Following lower endoscopy (colonoscopy or flexible sigmoidoscopy):  Excessive amounts of blood in the stool  Significant tenderness or worsening of abdominal pains  Swelling of the abdomen that is new, acute  Fever of 100F or higher  Following upper endoscopy (EGD)  Vomiting of blood or coffee ground material  New chest pain or pain under the shoulder blades  Painful or persistently difficult swallowing  New shortness of breath  Fever of 100F or higher  Black, tarry-looking stools  FOLLOW UP: If any biopsies were taken you will be contacted by phone or by letter within the next 1-3 weeks.  Call your gastroenterologist if you have not heard about the biopsies in 3 weeks.  Our staff will call the home number listed on your records the next business day following your procedure to check on you and address any questions or concerns that you may have at that time regarding the information given to you following your procedure. This is a courtesy call and so if there is no answer at the home number and we have not heard from you through the emergency physician on call, we will assume that you have returned to your regular daily activities without incident.  SIGNATURES/CONFIDENTIALITY: You and/or your care   partner have signed paperwork which will be entered into your electronic medical record.  These signatures attest to the fact that that the information above on your After Visit Summary has been reviewed and is understood.  Full responsibility of the confidentiality of this discharge information lies with you and/or your care-partner.  

## 2012-03-08 NOTE — Progress Notes (Signed)
Patient did not experience any of the following events: a burn prior to discharge; a fall within the facility; wrong site/side/patient/procedure/implant event; or a hospital transfer or hospital admission upon discharge from the facility. (G8907) Patient did not have preoperative order for IV antibiotic SSI prophylaxis. (G8918)  

## 2012-03-08 NOTE — Telephone Encounter (Signed)
Blanca to notify pt  

## 2012-03-08 NOTE — Telephone Encounter (Signed)
  You have been scheduled for a CT scan of the Chest at [facility] ([address]---this is in the same building as Architectural technologist).   You are scheduled on 03/09/12  at 130 pm. You should arrive 15 minutes prior to your appointment time for registration. Please follow the written instructions below on the day of your exam:  WARNING: IF YOU ARE ALLERGIC TO IODINE/X-RAY DYE, PLEASE NOTIFY RADIOLOGY IMMEDIATELY AT [phone]! YOU WILL BE GIVEN A 13 HOUR PREMEDICATION PREP.  Please have nothing to eat or drink 2 hours prior to the appointment.     You may take any medications as prescribed with a small amount of water except for the following: Metformin, Glucophage, Glucovance, Avandamet, Riomet, Fortamet, Actoplus Met, Janumet, Glumetza or Metaglip. The above medications must be held the day of the exam AND 48 hours after the exam.  The purpose of you drinking the oral contrast is to aid in the visualization of your intestinal tract. The contrast solution may cause some diarrhea. Before your exam is started, you will be given a small amount of fluid to drink. Depending on your individual set of symptoms, you may also receive an intravenous injection of x-ray contrast/dye. Plan on being at [facility] for 30 minutes or long, depending on the type of exam you are having performed.  If you have any questions regarding your exam or if you need to reschedule, you may call the CT department at [phone] between the hours of 8:00 am and 5:00 pm, Monday-Friday.  ________________________________________________________________________ Instructions were taken to the [place] lab and given to the pt's wife.  He is getting his CEA and referral to [facility] has been made.

## 2012-03-09 ENCOUNTER — Ambulatory Visit (INDEPENDENT_AMBULATORY_CARE_PROVIDER_SITE_OTHER)
Admission: RE | Admit: 2012-03-09 | Discharge: 2012-03-09 | Disposition: A | Discharge status: Home / Self Care | Payer: Medicare Other | Source: Ambulatory Visit | Attending: Gastroenterology | Admitting: Gastroenterology

## 2012-03-09 ENCOUNTER — Telehealth: Payer: Self-pay | Admitting: *Deleted

## 2012-03-09 DIAGNOSIS — K6389 Other specified diseases of intestine: Secondary | ICD-10-CM

## 2012-03-09 DIAGNOSIS — C180 Malignant neoplasm of cecum: Secondary | ICD-10-CM

## 2012-03-09 MED ORDER — IOHEXOL 300 MG/ML  SOLN
80.0000 mL | Freq: Once | INTRAMUSCULAR | Status: AC | PRN
  Administered 2012-03-09: 80 mL via INTRAVENOUS

## 2012-03-09 NOTE — Telephone Encounter (Signed)
  Follow up Call-  Call back number 03/08/2012  Post procedure Call Back phone  # [phone]  Permission to leave phone message No     Patient questions:  Do you have a fever, pain , or abdominal swelling? no Pain Score  0 *  Have you tolerated food without any problems? yes  Have you been able to return to your normal activities? yes  Do you have any questions about your discharge instructions: Diet   no Medications  no Follow up visit  no  Do you have questions or concerns about your Care? no  Actions: * If pain score is 4 or above: No action needed, pain <4.

## 2012-03-11 ENCOUNTER — Telehealth: Payer: Self-pay | Admitting: Gastroenterology

## 2012-03-11 NOTE — Telephone Encounter (Signed)
Pt notified results not available yet

## 2012-03-19 ENCOUNTER — Ambulatory Visit (INDEPENDENT_AMBULATORY_CARE_PROVIDER_SITE_OTHER): Payer: 59 | Admitting: Family Medicine

## 2012-03-19 ENCOUNTER — Encounter: Payer: Self-pay | Admitting: Family Medicine

## 2012-03-19 ENCOUNTER — Emergency Department (HOSPITAL_COMMUNITY): Payer: Medicare Other

## 2012-03-19 ENCOUNTER — Encounter (HOSPITAL_COMMUNITY): Payer: Self-pay | Admitting: Emergency Medicine

## 2012-03-19 ENCOUNTER — Inpatient Hospital Stay (HOSPITAL_COMMUNITY)
Admission: EM | Admit: 2012-03-19 | Discharge: 2012-03-28 | DRG: 329 | Disposition: A | Discharge status: Skilled Nursing Facility | Payer: Medicare Other | Source: Ambulatory Visit | Attending: Internal Medicine | Admitting: Internal Medicine

## 2012-03-19 VITALS — BP 124/80 | Temp 98.7°F | Wt 222.0 lb

## 2012-03-19 DIAGNOSIS — R109 Unspecified abdominal pain: Secondary | ICD-10-CM

## 2012-03-19 DIAGNOSIS — J690 Pneumonitis due to inhalation of food and vomit: Secondary | ICD-10-CM | POA: Diagnosis present

## 2012-03-19 DIAGNOSIS — Z87891 Personal history of nicotine dependence: Secondary | ICD-10-CM

## 2012-03-19 DIAGNOSIS — C180 Malignant neoplasm of cecum: Principal | ICD-10-CM | POA: Diagnosis present

## 2012-03-19 DIAGNOSIS — I10 Essential (primary) hypertension: Secondary | ICD-10-CM

## 2012-03-19 DIAGNOSIS — C189 Malignant neoplasm of colon, unspecified: Secondary | ICD-10-CM

## 2012-03-19 DIAGNOSIS — F0390 Unspecified dementia without behavioral disturbance: Secondary | ICD-10-CM | POA: Diagnosis present

## 2012-03-19 DIAGNOSIS — K219 Gastro-esophageal reflux disease without esophagitis: Secondary | ICD-10-CM | POA: Diagnosis present

## 2012-03-19 DIAGNOSIS — J189 Pneumonia, unspecified organism: Secondary | ICD-10-CM | POA: Diagnosis present

## 2012-03-19 DIAGNOSIS — G4730 Sleep apnea, unspecified: Secondary | ICD-10-CM

## 2012-03-19 DIAGNOSIS — E876 Hypokalemia: Secondary | ICD-10-CM | POA: Diagnosis present

## 2012-03-19 DIAGNOSIS — R443 Hallucinations, unspecified: Secondary | ICD-10-CM | POA: Diagnosis present

## 2012-03-19 DIAGNOSIS — F05 Delirium due to known physiological condition: Secondary | ICD-10-CM | POA: Diagnosis present

## 2012-03-19 DIAGNOSIS — G4733 Obstructive sleep apnea (adult) (pediatric): Secondary | ICD-10-CM | POA: Diagnosis present

## 2012-03-19 LAB — BASIC METABOLIC PANEL
BUN: 14 mg/dL (ref 6–23)
Chloride: 105 mEq/L (ref 96–112)
Creatinine, Ser: 1.01 mg/dL (ref 0.50–1.35)
GFR calc Af Amer: 81 mL/min — ABNORMAL LOW (ref >90)
GFR calc non Af Amer: 70 mL/min — ABNORMAL LOW (ref >90)
Potassium: 3.7 mEq/L (ref 3.5–5.1)

## 2012-03-19 LAB — URINALYSIS, ROUTINE W REFLEX MICROSCOPIC
Glucose, UA: NEGATIVE mg/dL
Hgb urine dipstick: NEGATIVE
Ketones, ur: NEGATIVE mg/dL
Protein, ur: NEGATIVE mg/dL
Urobilinogen, UA: 0.2 mg/dL (ref 0.0–1.0)

## 2012-03-19 LAB — CBC WITH DIFFERENTIAL/PLATELET
Basophils Absolute: 0 10*3/uL (ref 0.0–0.1)
Eosinophils Absolute: 0.1 10*3/uL (ref 0.0–0.7)
Lymphocytes Relative: 18 % (ref 12–46)
MCHC: 34.6 g/dL (ref 30.0–36.0)
Monocytes Relative: 10 % (ref 3–12)
Platelets: 115 10*3/uL — ABNORMAL LOW (ref 150–400)
RDW: 13.5 % (ref 11.5–15.5)
WBC: 5.9 10*3/uL (ref 4.0–10.5)

## 2012-03-19 LAB — LIPASE, BLOOD: Lipase: 36 U/L (ref 11–59)

## 2012-03-19 MED ORDER — ZOLPIDEM TARTRATE 5 MG PO TABS
5.0000 mg | ORAL_TABLET | Freq: Once | ORAL | Status: AC
  Administered 2012-03-19: 5 mg via ORAL
  Filled 2012-03-19: qty 1

## 2012-03-19 MED ORDER — ACETAMINOPHEN 650 MG RE SUPP: 650.0000 mg | Freq: Four times a day (QID) | RECTAL | Status: DC | PRN

## 2012-03-19 MED ORDER — HYDROMORPHONE HCL PF 1 MG/ML IJ SOLN
1.0000 mg | INTRAMUSCULAR | Status: DC | PRN
  Administered 2012-03-19 – 2012-03-20 (×2): 1 mg via INTRAVENOUS
  Filled 2012-03-19 (×2): qty 1

## 2012-03-19 MED ORDER — LEVOFLOXACIN IN D5W 750 MG/150ML IV SOLN
750.0000 mg | Freq: Once | INTRAVENOUS | Status: AC
  Administered 2012-03-20: 750 mg via INTRAVENOUS
  Filled 2012-03-19: qty 150

## 2012-03-19 MED ORDER — HYDROMORPHONE HCL PF 1 MG/ML IJ SOLN
1.0000 mg | Freq: Once | INTRAMUSCULAR | Status: AC
  Administered 2012-03-19: 1 mg via INTRAVENOUS
  Filled 2012-03-19: qty 1

## 2012-03-19 MED ORDER — ALUM & MAG HYDROXIDE-SIMETH 200-200-20 MG/5ML PO SUSP: 30.0000 mL | Freq: Four times a day (QID) | ORAL | Status: DC | PRN

## 2012-03-19 MED ORDER — ONDANSETRON HCL 4 MG/2ML IJ SOLN: 4.0000 mg | Freq: Four times a day (QID) | INTRAMUSCULAR | Status: DC | PRN

## 2012-03-19 MED ORDER — SODIUM CHLORIDE 0.9 % IV SOLN
INTRAVENOUS | Status: AC
  Administered 2012-03-19: 19:00:00 via INTRAVENOUS

## 2012-03-19 MED ORDER — ONDANSETRON HCL 4 MG PO TABS: 4.0000 mg | ORAL_TABLET | Freq: Four times a day (QID) | ORAL | Status: DC | PRN

## 2012-03-19 MED ORDER — HYDROCODONE-ACETAMINOPHEN 5-325 MG PO TABS
1.0000 | ORAL_TABLET | ORAL | Status: DC | PRN
  Administered 2012-03-19 – 2012-03-20 (×4): 2 via ORAL
  Filled 2012-03-19 (×4): qty 2

## 2012-03-19 MED ORDER — PANTOPRAZOLE SODIUM 40 MG PO TBEC
40.0000 mg | DELAYED_RELEASE_TABLET | Freq: Every day | ORAL | Status: DC
  Administered 2012-03-20: 40 mg via ORAL
  Filled 2012-03-19: qty 1

## 2012-03-19 MED ORDER — IOHEXOL 300 MG/ML  SOLN
100.0000 mL | Freq: Once | INTRAMUSCULAR | Status: AC | PRN
  Administered 2012-03-19: 100 mL via INTRAVENOUS

## 2012-03-19 MED ORDER — LEVOFLOXACIN IN D5W 750 MG/150ML IV SOLN
750.0000 mg | Freq: Once | INTRAVENOUS | Status: AC
  Administered 2012-03-19: 750 mg via INTRAVENOUS
  Filled 2012-03-19: qty 150

## 2012-03-19 MED ORDER — ACETAMINOPHEN 325 MG PO TABS
650.0000 mg | ORAL_TABLET | Freq: Four times a day (QID) | ORAL | Status: DC | PRN
  Administered 2012-03-19: 650 mg via ORAL
  Filled 2012-03-19: qty 2

## 2012-03-19 MED ORDER — PIPERACILLIN-TAZOBACTAM 3.375 G IVPB
3.3750 g | Freq: Three times a day (TID) | INTRAVENOUS | Status: AC
  Administered 2012-03-19 – 2012-03-21 (×6): 3.375 g via INTRAVENOUS
  Administered 2012-03-21: 1.75 g via INTRAVENOUS
  Administered 2012-03-22 – 2012-03-26 (×14): 3.375 g via INTRAVENOUS
  Filled 2012-03-19 (×21): qty 50

## 2012-03-19 MED ORDER — SODIUM CHLORIDE 0.9 % IV BOLUS (SEPSIS)
1000.0000 mL | Freq: Once | INTRAVENOUS | Status: AC
  Administered 2012-03-19: 1000 mL via INTRAVENOUS

## 2012-03-19 MED ORDER — VANCOMYCIN HCL 1000 MG IV SOLR
1250.0000 mg | Freq: Two times a day (BID) | INTRAVENOUS | Status: AC
  Administered 2012-03-19 – 2012-03-26 (×14): 1250 mg via INTRAVENOUS
  Filled 2012-03-19 (×15): qty 1250

## 2012-03-19 MED ORDER — ONDANSETRON HCL 4 MG/2ML IJ SOLN
4.0000 mg | Freq: Once | INTRAMUSCULAR | Status: AC
  Administered 2012-03-19: 4 mg via INTRAVENOUS
  Filled 2012-03-19: qty 2

## 2012-03-19 NOTE — H&P (Signed)
PCP:   Hannah Beat, MD   Chief Complaint:  Abdominal pain  HPI: John Summers comes in today with worsening right lower quadrant abdominal pain, associated with some diarrhea, no vomiting. He was diagnosed of cecal adenocarcinoma, s/p colonoscopy by Dr Christella Hartigan on 03/08/12. He had been referred to Dr Donell Beers for possible colectomy, and was going to see her mid next week but he developed worsening pain, hence presentation today. He has had diarrhea on and off. A CT abdomen/pelvis suggested a progressive mass in the region of the cecal tip, with some fat stranding. Incidentally, there is suggestion pneumonia. Patient's wife, who is providing most of the history, says John Summers has been coughing more than usual, a dry cough, and he easily gets fatigued and sweaty with minimal exertion, in the last couple of days. He has also been sleeping a lot, and she thinks the dementia has progressed rather rapidly in the last couple of months. He was admitted about a month ago, at which point the mass was discovered. At the time of my evaluation, John Summers is rather somnolent due to dilaudid given for unbearable pain.  Review of Systems:  Unremarkable except as highlighted in the hpi.  Past Medical History: Past Medical History  Diagnosis Date  . GERD (gastroesophageal reflux disease)   . Pollen allergies   . Dementia   . Sleep apnea   . Unspecified essential hypertension   . Thrombocytopenia     Dr. Orlie Dakin follows  . Skin cancer     ears  . History of colon polyps   . History of gallstones   . Status post dilation of esophageal narrowing   . Colon adenocarcinoma   . Kidney stones    Past Surgical History  Procedure Date  . Cholecystectomy 2011  . Tonsillectomy and adenoidectomy 1946  . Total hip arthroplasty     left  . Lithotripsy 2008  . Pilonidal cyst excision   . Upper gastrointestinal endoscopy     Medications: Prior to Admission medications   Medication Sig Start Date End Date  Taking? Authorizing Provider  donepezil (ARICEPT) 10 MG tablet Take 10 mg by mouth at bedtime.     Yes Historical Provider, MD  omeprazole (PRILOSEC) 20 MG capsule Take 20 mg by mouth daily.     Yes Historical Provider, MD  triamterene-hydrochlorothiazide (MAXZIDE-25) 37.5-25 MG per tablet Take 0.5 each (0.5 tablets total) by mouth daily. 12/16/11  Yes Hannah Beat, MD    Allergies:   Allergies  Allergen Reactions  . Chicken Allergy     unknown    Social History:  reports that he has quit smoking. His smoking use included Pipe. He has never used smokeless tobacco. He reports that he drinks alcohol. He reports that he does not use illicit drugs. lives with wife. Daughter Charline Bills works in the ED.  Family History: Family History  Problem Relation Age of Onset  . Colon cancer Mother   . Heart disease Mother   . Hypertension Mother   . Alcohol abuse Father   . Esophageal cancer Neg Hx   . Rectal cancer Neg Hx   . Stomach cancer Neg Hx     Physical Exam: Filed Vitals:   03/19/12 1208 03/19/12 1348 03/19/12 1550 03/19/12 1744  BP: 119/73 107/60 103/69 100/59  Pulse: 83 75 81 86  Temp: 98.3 F (36.8 C)     TempSrc: Oral     Resp: 22 20 17 16   SpO2: 98% 94% 98% 95%  Sedated, comfortable. PERRLA. Dry oral mucosa. No jvd. Good air entry bilaterally. Scattered rhonchi. No wheezing. S1S2 heard. No murmurs. RRR. Abdomen- +BS. soft, tenderness with some guarding right lower quadrant in the periumbilical area. Could not evaluate further due to pain. CNS- somnolent. Moving all extremities. Extremities- no pedal edema.   Labs on Admission:   [facility] 03/19/12 1224  NA 142  K 3.7  CL 105  CO2 25  GLUCOSE 129*  BUN 14  CREATININE 1.01  CALCIUM 9.1  MG --  PHOS --   No results found for this basename: AST:2,ALT:2,ALKPHOS:2,BILITOT:2,PROT:2,ALBUMIN:2 in the last 72 hours  Basename 03/19/12 1300  LIPASE 36  AMYLASE --    Basename 03/19/12 1224  WBC 5.9  NEUTROABS 4.1    HGB 13.8  HCT 39.9  MCV 95.9  PLT 115*   No results found for this basename: CKTOTAL:3,CKMB:3,CKMBINDEX:3,TROPONINI:3 in the last 72 hours No results found for this basename: TSH,T4TOTAL,FREET3,T3FREE,THYROIDAB in the last 72 hours No results found for this basename: VITAMINB12:2,FOLATE:2,FERRITIN:2,TIBC:2,IRON:2,RETICCTPCT:2 in the last 72 hours  Radiological Exams on Admission: Ct Chest W Contrast  03/09/2012  *RADIOLOGY REPORT*  Clinical Data: Newly diagnosed colon mass.  CT CHEST WITH CONTRAST  Technique:  Multidetector CT imaging of the chest was performed following the standard protocol during bolus administration of intravenous contrast.  Contrast: 80mL OMNIPAQUE IOHEXOL 300 MG/ML  SOLN  Comparison: CT abdomen pelvis 02/20/2012.  Findings: No pathologically enlarged mediastinal, hilar or axillary lymph nodes.  Air in the main pulmonary artery is presumably echogenic.  Coronary artery calcification.  Heart size normal.  No pericardial effusion.  Small hiatal hernia.  There is subpleural airspace consolidation in the posterior segment right upper lobe.  Rounded air space consolidation in the right lower lobe is new from 02/20/2012.  There are additional areas of peribronchovascular consolidation in the right lower lobe, not previously imaged.  Questionable faint 3 mm nodule in the left lower lobe (image 25).  No pleural fluid.  Airway is unremarkable.  Incidental imaging of the upper abdomen shows no 1.5 cm low attenuation lesion in the region of the pancreatic head (image 52). No worrisome lytic or sclerotic lesions.  IMPRESSION:  1.  No definitive evidence of metastatic disease in the chest. 2.  Subpleural airspace consolidation in the right upper and right lower lobes with additional peribronchovascular nodularity in the right lower lobe.  Findings favor pneumonia.  Continued attention on follow-up exams is warranted.  Original Report Authenticated By: Reyes Ivan, M.D.   Ct Abdomen  Pelvis W Contrast  03/19/2012  *RADIOLOGY REPORT*  Clinical Data: Abdominal pain following recent colonoscopy.  CT ABDOMEN AND PELVIS WITH CONTRAST  Technique:  Multidetector CT imaging of the abdomen and pelvis was performed following the standard protocol during bolus administration of intravenous contrast.  Contrast: OMNIPAQUE IOHEXOL 300 MG/ML  SOLN  Comparison: 02/20/2012  Findings: There is pulmonary infiltrate in the right lower lobe consistent with pneumonia.  No pleural or pericardial fluid.  There is a hiatal hernia.  The liver has a normal appearance without focal lesions or biliary ductal dilatation.  The left lobe is diminutive.  There is been previous cholecystectomy.  Common duct is prominent as is typically seen following diameter.  The spleen is normal.  The pancreas shows a 10 mm cystic lesion along the ventral surface at the junction of the body and head.  This could be a small cyst or cystic neoplasm. The adrenal glands are normal.  The kidneys are normal.  The aorta shows atherosclerotic change but no aneurysm.  The IVC crosses to the left of the aorta at the level of the renal veins.  Small bowel gas pattern is normal.  There is a small ventral hernia containing a small loop of small bowel.  No sign of obstruction or inflammation.  Cecal mass is again demonstrated, possibly progressive since the previous study.  There is inflammation of the fat surrounding the cecum.  This could be due to the recent intervention.  This could also be indicative of locally aggressive disease with regional spread.  The appendix does not appear grossly inflamed.  There is no free fluid in the pelvis.  Bladder, prostate gland and seminal vesicles appear unremarkable.  IMPRESSION: There is a patchy infiltrate in the right lung consistent with pneumonia.  The differential diagnosis does include pulmonary infarct due to emboli.  Worsening of the appearance in the region of the cecal tip.  It appears that the mass  is progressive.  There is stranding in the regional fat, probably with some small lymph nodes.  The findings could represent inflammation following the recent colonoscopy and presumed biopsy.  Alternatively, this could indicate aggressive local progression of disease.  Small bowel loop now extends into the small ventral hernia but there is no evidence of obstruction or incarceration.  Original Report Authenticated By: Thomasenia Sales, M.D.   Ct Abdomen Pelvis W Contrast  02/20/2012  *RADIOLOGY REPORT*  Clinical Data: Left upper quadrant pain  CT ABDOMEN AND PELVIS WITH CONTRAST  Technique:  Multidetector CT imaging of the abdomen and pelvis was performed following the standard protocol during bolus administration of intravenous contrast.  Contrast: OMNIPAQUE IOHEXOL 300 MG/ML  SOLN  Comparison: None.  Findings: Lung bases are unremarkable.  Sagittal images of the spine shows osteopenia and mild degenerative changes lumbar spine.  Enhanced liver is unremarkable.  The patient is status post cholecystectomy.  There is small hiatal hernia.  There is small paraesophageal hernia containing fluid measures about 3.9 cm.  Tortuous splenic artery with atherosclerotic calcifications. Spleen, pancreas and adrenal glands are unremarkable.  Kidneys are symmetrical in size and enhancement without evidence of focal mass.  Mild distended small bowel loops are noted with fluid. Mild ileus cannot be excluded.  No air-fluid levels are noted to suggest small bowel obstruction.  There is a small intraluminal lipoma in a  small bowel loop in axial image 45 measures 1.2 cm.  Again there is no evidence of small bowel obstruction.  Small umbilical hernia containing fat without evidence of acute complication.  Fluid is noted in the colon.  In axial image 59 there is a ring- like enhancement of the mucosa in the cecum with concentric appearance.  This is confirmed on the sagittal image 54.  Although findings may be due to adherent stool  I cannot exclude a cecal mass.  Follow-up colonoscopy is recommended.  There is no evidence of colitis.  No pericolonic inflammation is noted.  There are metallic artifacts from left hip prosthesis. Small left hydrocele is noted.  No destructive bony lesions are noted within pelvis.  The urinary bladder is unremarkable. Prostate gland measures 4.2 x 4.7 cm.  CBD measures 1.1 cm in diameter.  Delayed renal images shows bilateral renal symmetrical excretion.  Bilateral visualized proximal ureter is unremarkable.  IMPRESSION:  1. 1.  Small hiatal hernia is noted.  Small paraesophageal hernia containing fluid is noted.  2.  Status post cholecystectomy.  3.  Fluid and mild  distended small bowel loops without air fluid levels to suggest small bowel obstruction.  Mild ileus cannot be excluded. A focal intraluminal small bowel lipoma without evidence of acute complication 3.  There is a ring-like high density material or enhancement in the cecum in axial image 59.  Although findings may be due to adherent stool a cecal mass cannot be excluded.  Follow-up colonoscopy is recommended.  4.  No evidence of colitis. 5.  There are metallic artifacts from a  left hip prosthesis. 6.  Small left hydrocele.  Original Report Authenticated By: Natasha Mead, M.D.   Dg Chest Portable 1 View  03/19/2012  *RADIOLOGY REPORT*  Clinical Data: Cough.  Shortness of breath.  PORTABLE CHEST - 1 VIEW 03/19/2012 1315 hours:  Comparison: CT chest 03/09/2012.  Findings: Airspace consolidation deep in the right lower lobe on the prior CT vaguely visible on the current examination. Consolidation in the right upper lobe has improved in the interval, though minimal patchy airspace opacity persists.  Left lung remains clear.  Cardiac silhouette enlarged but stable.  Pulmonary vascularity normal without evidence of pulmonary edema.  IMPRESSION: Improved pneumonia in the right upper and right lower lobes, though patchy airspace opacities persist.  No new  abnormalities.  Stable cardiomegaly without pulmonary edema.  Original Report Authenticated By: Arnell Sieving, M.D.    Assessment  John Summers presents with worsening abdominal pain in area of cecal mass, s/p colonoscopy and biopsy recently. There is suggestion of progression of cecal mass(adenocarcinoma) versus an inflammatory process post colonoscopy. He also has PNA, HCAP versus aspiration PNA. The deterioration in memory may well be related to dementia, but I would consider brain metastasis in his situation. I have discussed with Dr Lindie Spruce, who will see patient on a non emergent basis.  Plan  .Adenocarcinoma Of Cecum/Abdominal pain, acute- possible superinfection versus progression of adenocarcinoma, which would be rather rapid. Unbearable pain. Admit med-surg. Pain control. Clear liquids. Will be on abx for PNA as well, in case super infection. Surgical Consult. Brain MRI r/o mets. Oncology consult with surgical direction. Marland KitchenHCAP (healthcare-associated pneumonia)- cover for nosocomial organisms and anerobes with vanc/levaquin/zosyn. 7 day course of abx. Sputum culture. Marland KitchenGERD (gastroesophageal reflux disease)- continue ppi. .Dementia- supportive care. .dvt prophylaxis- scds. Condition closely guarded. Discussed plan of care with Mrs Rickel at bed side.   John Summers [phone] 03/19/2012, 6:36 PM

## 2012-03-19 NOTE — Progress Notes (Signed)
  Subjective:    Patient ID: John Summers, male    DOB: 1933/05/28, 78 y.o.   MRN: [id number]  HPI John Summers is a 78 yo male recently dx w colon cancer,cecum,,,,,,by dr. Christella Hartigan.. Due to see dr. Jearld Fenton on wed. But now having constant rlq pain in this am..  No fever,nausea,vomiting etc   Review of Systems    neg Objective:   Physical Exam wdwn male in wheel chair w severe pain       Assessment & Plan:  Recent dx of cecal carcinoma with severe pain........admit

## 2012-03-19 NOTE — Progress Notes (Addendum)
ANTIBIOTIC CONSULT NOTE - INITIAL  Pharmacy Consult for vanc/zosyn Indication: pneumonia  Allergies  Allergen Reactions  . Chicken Allergy     unknown    Vital Signs: Temp: 98.3 F (36.8 C) (07/06 1208) Temp src: Oral (07/06 1208) BP: 100/59 mmHg (07/06 1744) Pulse Rate: 86  (07/06 1744) Intake/Output from previous day:   Intake/Output from this shift: Total I/O In: 1000 [I.V.:1000] Out: -   Labs:  Basename 03/19/12 1224  WBC 5.9  HGB 13.8  PLT 115*  LABCREA --  CREATININE 1.01    Microbiology: Recent Results (from the past 720 hour(s))  CLOSTRIDIUM DIFFICILE BY PCR     Status: Normal   Collection Time   02/21/12  8:05 AM      Component Value Range Status Comment   C difficile by pcr NEGATIVE  NEGATIVE Final   STOOL CULTURE     Status: Normal   Collection Time   02/23/12  7:49 AM      Component Value Range Status Comment   Specimen Description STOOL   Final    Special Requests NONE   Final    Culture     Final    Value: NO SALMONELLA, SHIGELLA, CAMPYLOBACTER, YERSINIA, OR E.COLI 0157:H7 ISOLATED   Report Status 02/26/2012 FINAL   Final     Medical History: Past Medical History  Diagnosis Date  . GERD (gastroesophageal reflux disease)   . Pollen allergies   . Dementia   . Sleep apnea   . Unspecified essential hypertension   . Thrombocytopenia     Dr. Orlie Dakin follows  . Skin cancer     ears  . History of colon polyps   . History of gallstones   . Status post dilation of esophageal narrowing   . Colon adenocarcinoma   . Kidney stones     Medications:  Scheduled:    . sodium chloride   Intravenous STAT  . HYDROmorphone  1 mg Intravenous Once  . HYDROmorphone  1 mg Intravenous Once  . levofloxacin (LEVAQUIN) IV  750 mg Intravenous Once  . levofloxacin (LEVAQUIN) IV  750 mg Intravenous Once  . ondansetron (ZOFRAN) IV  4 mg Intravenous Once  . pantoprazole  40 mg Oral Q1200  . sodium chloride  1,000 mL Intravenous Once   Assessment: 78 yo who  was recently dx with colon CA. He presented today with severe abd pain. CT revealed PNA. Vanc/zosyn to be started empirically to cover for HCAP.   Goal of Therapy:  Vancomycin trough level 15-20 mcg/ml  Plan:  1. Vanc 1.25g IV now 2. Vanc 1.25g IV q12 3. Zosyn 3.375g IV q8  John Summers 03/19/2012,7:00 PM

## 2012-03-19 NOTE — Consult Note (Signed)
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 Referring Physician: Patient recently diagnosed with a large right colon cancer, started having abdominal pain yesterday, worsened today to excruciating pain.  Repeat CT shows growth in mass, no overt perforation.  John Summers is an 78 y.o. male.  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  Concerned about perforation of cancer.  Was scheduled to see Dr. Donell Beers next week, but pain so severe that he came into the ED today.  Past Medical History  Diagnosis Date  . GERD (gastroesophageal reflux disease)   . Pollen allergies   . Dementia   . Sleep apnea   . Unspecified essential hypertension   . Thrombocytopenia     Dr. Orlie Dakin follows  . Skin cancer     ears  . History of colon polyps   . History of gallstones   . Status post dilation of esophageal narrowing   . Colon adenocarcinoma   . Kidney stones     Past Surgical History  Procedure Date  . Cholecystectomy 2011  . Tonsillectomy and adenoidectomy 1946  . Total hip arthroplasty     left  . Lithotripsy 2008  . Pilonidal cyst excision   . Upper gastrointestinal endoscopy     Family History  Problem Relation Age of Onset  . Colon cancer Mother   . Heart disease Mother   . Hypertension Mother   . Alcohol abuse Father   . Esophageal cancer Neg Hx   . Rectal cancer Neg Hx   . Stomach cancer Neg Hx     Social History:  reports that he has quit smoking. His smoking use included Pipe. He has never used smokeless tobacco. He reports that he drinks alcohol. He reports that he does not use illicit drugs.  Allergies:  Allergies  Allergen Reactions  . Chicken Allergy     unknown    Medications: I have reviewed the patient's current medications.  Results for orders placed during the hospital encounter of 03/19/12 (from the past 48 hour(s))  CBC WITH DIFFERENTIAL     Status: Abnormal   Collection Time   03/19/12 12:24 PM      Component Value  Range Comment   WBC 5.9  4.0 - 10.5 K/uL    RBC 4.16 (*) 4.22 - 5.81 MIL/uL    Hemoglobin 13.8  13.0 - 17.0 g/dL    HCT 40.9  81.1 - 91.4 %    MCV 95.9  78.0 - 100.0 fL    MCH 33.2  26.0 - 34.0 pg    MCHC 34.6  30.0 - 36.0 g/dL    RDW 78.2  95.6 - 21.3 %    Platelets 115 (*) 150 - 400 K/uL PLATELET COUNT CONFIRMED BY SMEAR   Neutrophils Relative 71  43 - 77 %    Lymphocytes Relative 18  12 - 46 %    Monocytes Relative 10  3 - 12 %    Eosinophils Relative 1  0 - 5 %    Basophils Relative 0  0 - 1 %    Neutro Abs 4.1  1.7 - 7.7 K/uL    Lymphs Abs 1.1  0.7 - 4.0 K/uL    Monocytes Absolute 0.6  0.1 - 1.0 K/uL    Eosinophils Absolute 0.1  0.0 - 0.7 K/uL    Basophils Absolute 0.0  0.0 - 0.1 K/uL    Smear Review LARGE PLATELETS PRESENT     BASIC METABOLIC PANEL  Status: Abnormal   Collection Time   03/19/12 12:24 PM      Component Value Range Comment   Sodium 142  135 - 145 mEq/L    Potassium 3.7  3.5 - 5.1 mEq/L    Chloride 105  96 - 112 mEq/L    CO2 25  19 - 32 mEq/L    Glucose, Bld 129 (*) 70 - 99 mg/dL    BUN 14  6 - 23 mg/dL    Creatinine, Ser 1.61  0.50 - 1.35 mg/dL    Calcium 9.1  8.4 - 09.6 mg/dL    GFR calc non Af Amer 70 (*) >90 mL/min    GFR calc Af Amer 81 (*) >90 mL/min   LIPASE, BLOOD     Status: Normal   Collection Time   03/19/12  1:00 PM      Component Value Range Comment   Lipase 36  11 - 59 U/L   LACTIC ACID, PLASMA     Status: Normal   Collection Time   03/19/12  1:01 PM      Component Value Range Comment   Lactic Acid, Venous 1.2  0.5 - 2.2 mmol/L   URINALYSIS, ROUTINE W REFLEX MICROSCOPIC     Status: Abnormal   Collection Time   03/19/12  6:53 PM      Component Value Range Comment   Color, Urine YELLOW  YELLOW    APPearance CLEAR  CLEAR    Specific Gravity, Urine >1.046 (*) 1.005 - 1.030    pH 5.0  5.0 - 8.0    Glucose, UA NEGATIVE  NEGATIVE mg/dL    Hgb urine dipstick NEGATIVE  NEGATIVE    Bilirubin Urine NEGATIVE  NEGATIVE    Ketones, ur NEGATIVE   NEGATIVE mg/dL    Protein, ur NEGATIVE  NEGATIVE mg/dL    Urobilinogen, UA 0.2  0.0 - 1.0 mg/dL    Nitrite NEGATIVE  NEGATIVE    Leukocytes, UA NEGATIVE  NEGATIVE MICROSCOPIC NOT DONE ON URINES WITH NEGATIVE PROTEIN, BLOOD, LEUKOCYTES, NITRITE, OR GLUCOSE <1000 mg/dL.    Ct Abdomen Pelvis W Contrast  03/19/2012  *RADIOLOGY REPORT*  Clinical Data: Abdominal pain following recent colonoscopy.  CT ABDOMEN AND PELVIS WITH CONTRAST  Technique:  Multidetector CT imaging of the abdomen and pelvis was performed following the standard protocol during bolus administration of intravenous contrast.  Contrast: OMNIPAQUE IOHEXOL 300 MG/ML  SOLN  Comparison: 02/20/2012  Findings: There is pulmonary infiltrate in the right lower lobe consistent with pneumonia.  No pleural or pericardial fluid.  There is a hiatal hernia.  The liver has a normal appearance without focal lesions or biliary ductal dilatation.  The left lobe is diminutive.  There is been previous cholecystectomy.  Common duct is prominent as is typically seen following diameter.  The spleen is normal.  The pancreas shows a 10 mm cystic lesion along the ventral surface at the junction of the body and head.  This could be a small cyst or cystic neoplasm. The adrenal glands are normal.  The kidneys are normal.  The aorta shows atherosclerotic change but no aneurysm.  The IVC crosses to the left of the aorta at the level of the renal veins.  Small bowel gas pattern is normal.  There is a small ventral hernia containing a small loop of small bowel.  No sign of obstruction or inflammation.  Cecal mass is again demonstrated, possibly progressive since the previous study.  There is inflammation of the fat  surrounding the cecum.  This could be due to the recent intervention.  This could also be indicative of locally aggressive disease with regional spread.  The appendix does not appear grossly inflamed.  There is no free fluid in the pelvis.  Bladder, prostate gland  and seminal vesicles appear unremarkable.  IMPRESSION: There is a patchy infiltrate in the right lung consistent with pneumonia.  The differential diagnosis does include pulmonary infarct due to emboli.  Worsening of the appearance in the region of the cecal tip.  It appears that the mass is progressive.  There is stranding in the regional fat, probably with some small lymph nodes.  The findings could represent inflammation following the recent colonoscopy and presumed biopsy.  Alternatively, this could indicate aggressive local progression of disease.  Small bowel loop now extends into the small ventral hernia but there is no evidence of obstruction or incarceration.  Original Report Authenticated By: Thomasenia Sales, M.D.   Dg Chest Portable 1 View  03/19/2012  *RADIOLOGY REPORT*  Clinical Data: Cough.  Shortness of breath.  PORTABLE CHEST - 1 VIEW 03/19/2012 1315 hours:  Comparison: CT chest 03/09/2012.  Findings: Airspace consolidation deep in the right lower lobe on the prior CT vaguely visible on the current examination. Consolidation in the right upper lobe has improved in the interval, though minimal patchy airspace opacity persists.  Left lung remains clear.  Cardiac silhouette enlarged but stable.  Pulmonary vascularity normal without evidence of pulmonary edema.  IMPRESSION: Improved pneumonia in the right upper and right lower lobes, though patchy airspace opacities persist.  No new abnormalities.  Stable cardiomegaly without pulmonary edema.  Original Report Authenticated By: Arnell Sieving, M.D.    Review of Systems  Constitutional: Positive for fever, chills and weight loss (10 pounds in last 3 weeks.).  HENT: Negative.   Eyes: Negative.   Respiratory: Positive for cough (recent cough, possible PNA).   Cardiovascular: Negative.   Gastrointestinal: Positive for abdominal pain and diarrhea. Negative for blood in stool.  Genitourinary: Negative.   Musculoskeletal: Negative.   Skin:  Negative.   Neurological: Positive for weakness.  Endo/Heme/Allergies: Negative.   Psychiatric/Behavioral: Negative.    Blood pressure 137/74, pulse 89, temperature 102.1 F (38.9 C), temperature source Oral, resp. rate 18, height 5\' 10"  (1.778 m), weight 100.699 kg (222 lb), SpO2 99.00%. Physical Exam  Constitutional: He appears well-developed and well-nourished.  HENT:  Head: Normocephalic and atraumatic.  Eyes: Pupils are equal, round, and reactive to light.  Cardiovascular: Normal rate and regular rhythm.   Respiratory: Effort normal and breath sounds normal.  GI: Soft. Bowel sounds are normal. He exhibits no mass. There is tenderness in the right lower quadrant.  Musculoskeletal: Normal range of motion.  Neurological: He is disoriented (not totally confused, but early dementia).  Skin: Skin is warm and dry.  Psychiatric: He has a normal mood and affect. Cognition and memory are impaired. He exhibits abnormal recent memory.    Assessment/Plan: Acute abdominal pain and fevers with known cecal cancer Some concern that this may be starting to necrose and perforate  Patient is on antibiotic for possible PNA and this will also cover him for possible colitis related to his cancer.  Patient will probably need right colectomy early next week.  Possibly start bowel prep in the AM. Deneene Tarver O 03/19/2012, 9:54 PM

## 2012-03-19 NOTE — ED Notes (Signed)
Pt has a urinal and knows we need a urine sample 

## 2012-03-19 NOTE — ED Provider Notes (Signed)
History     CSN: [id number]  Arrival date & time 03/19/12  1202   First MD Initiated Contact with Patient 03/19/12 1225      Chief Complaint  Patient presents with  . Abdominal Pain    (Consider location/radiation/quality/duration/timing/severity/associated sxs/prior treatment) HPI  79yoM h/o ITP, recently diagnosed colon adenocarcinoma, nephrolithiasis, ITP presents with abdominal pain. The patient states that he's been having intermittent abdominal pain times months. He was recently diagnosed with cecal cancer. He states that this morning at 5:30 he felt well. Proximally 7:30 he began to have severe right lower part-time pain radiating to his navel. He describes the pain as 9 point 9.5/10 at this time. Describes it as sharp. There is no back pain. He denies fevers. He states he always has chills. +nausea without vomiting. Denies hematuria/dysuria/freq/urgency. +Cough. Min SOB.  Abdominal surgeries include cholecystectomy CT chest 02/2012 with pneumonia- per family untreated Last lithotripsy several years ago    Past Medical History  Diagnosis Date  . GERD (gastroesophageal reflux disease)   . Pollen allergies   . Dementia   . Sleep apnea   . Unspecified essential hypertension   . Thrombocytopenia     Dr. Orlie Dakin follows  . Skin cancer     ears  . History of colon polyps   . History of gallstones   . Status post dilation of esophageal narrowing   . Colon adenocarcinoma   . Kidney stones     Past Surgical History  Procedure Date  . Cholecystectomy 2011  . Tonsillectomy and adenoidectomy 1946  . Total hip arthroplasty     left  . Lithotripsy 2008  . Pilonidal cyst excision   . Upper gastrointestinal endoscopy     Family History  Problem Relation Age of Onset  . Colon cancer Mother   . Heart disease Mother   . Hypertension Mother   . Alcohol abuse Father   . Esophageal cancer Neg Hx   . Rectal cancer Neg Hx   . Stomach cancer Neg Hx     History    Substance Use Topics  . Smoking status: Former Smoker    Types: Pipe  . Smokeless tobacco: Never Used  . Alcohol Use: Yes     rarely      Review of Systems  All other systems reviewed and are negative.   except as noted HPI   Allergies  Chicken allergy  Home Medications   No current outpatient prescriptions on file.  BP 109/64  Pulse 65  Temp 97.7 F (36.5 C) (Axillary)  Resp 18  Ht 5\' 10"  (1.778 m)  Wt 222 lb (100.699 kg)  BMI 31.85 kg/m2  SpO2 95%  Physical Exam  Nursing note and vitals reviewed. Constitutional: He is oriented to person, place, and time. He appears well-developed and well-nourished. No distress.       Appears to be in pain  HENT:  Head: Atraumatic.  Mouth/Throat: Oropharynx is clear and moist.  Eyes: Conjunctivae are normal. Pupils are equal, round, and reactive to light.  Neck: Neck supple.  Cardiovascular: Normal rate, regular rhythm, normal heart sounds and intact distal pulses.  Exam reveals no gallop and no friction rub.   No murmur heard. Pulmonary/Chest: Effort normal. No respiratory distress. He has no wheezes. He has no rales.  Abdominal: Soft. Bowel sounds are normal. He exhibits no distension. There is tenderness. There is no rebound and no guarding.       Diffuse lower ttp > RLQ Difficult to assess  for r/g-- pt will not allow full abd exam 2/2 worsening pain with palpation  Musculoskeletal: Normal range of motion. He exhibits no edema and no tenderness.  Neurological: He is alert and oriented to person, place, and time.  Skin: Skin is warm and dry.  Psychiatric: He has a normal mood and affect.    ED Course  Procedures (including critical care time)  Labs Reviewed  CBC WITH DIFFERENTIAL - Abnormal; Notable for the following:    RBC 4.16 (*)     Platelets 115 (*)  PLATELET COUNT CONFIRMED BY SMEAR   All other components within normal limits  BASIC METABOLIC PANEL - Abnormal; Notable for the following:    Glucose, Bld 129  (*)     GFR calc non Af Amer 70 (*)     GFR calc Af Amer 81 (*)     All other components within normal limits  URINALYSIS, ROUTINE W REFLEX MICROSCOPIC - Abnormal; Notable for the following:    Specific Gravity, Urine >1.046 (*)     All other components within normal limits  COMPREHENSIVE METABOLIC PANEL - Abnormal; Notable for the following:    Glucose, Bld 103 (*)     Calcium 8.2 (*)     Albumin 2.4 (*)     AST 48 (*)     GFR calc non Af Amer 58 (*)     GFR calc Af Amer 68 (*)     All other components within normal limits  PROTIME-INR - Abnormal; Notable for the following:    Prothrombin Time 16.5 (*)     All other components within normal limits  CBC - Abnormal; Notable for the following:    RBC 3.40 (*)     Hemoglobin 11.5 (*)     HCT 33.1 (*)     Platelets 83 (*)  CONSISTENT WITH PREVIOUS RESULT   All other components within normal limits  LIPASE, BLOOD  LACTIC ACID, PLASMA  APTT  CULTURE, BLOOD (ROUTINE X 2)  CULTURE, BLOOD (ROUTINE X 2)  URINE CULTURE  CULTURE, EXPECTORATED SPUTUM-ASSESSMENT   Ct Abdomen Pelvis W Contrast  03/19/2012  *RADIOLOGY REPORT*  Clinical Data: Abdominal pain following recent colonoscopy.  CT ABDOMEN AND PELVIS WITH CONTRAST  Technique:  Multidetector CT imaging of the abdomen and pelvis was performed following the standard protocol during bolus administration of intravenous contrast.  Contrast: OMNIPAQUE IOHEXOL 300 MG/ML  SOLN  Comparison: 02/20/2012  Findings: There is pulmonary infiltrate in the right lower lobe consistent with pneumonia.  No pleural or pericardial fluid.  There is a hiatal hernia.  The liver has a normal appearance without focal lesions or biliary ductal dilatation.  The left lobe is diminutive.  There is been previous cholecystectomy.  Common duct is prominent as is typically seen following diameter.  The spleen is normal.  The pancreas shows a 10 mm cystic lesion along the ventral surface at the junction of the body and  head.  This could be a small cyst or cystic neoplasm. The adrenal glands are normal.  The kidneys are normal.  The aorta shows atherosclerotic change but no aneurysm.  The IVC crosses to the left of the aorta at the level of the renal veins.  Small bowel gas pattern is normal.  There is a small ventral hernia containing a small loop of small bowel.  No sign of obstruction or inflammation.  Cecal mass is again demonstrated, possibly progressive since the previous study.  There is inflammation of the fat  surrounding the cecum.  This could be due to the recent intervention.  This could also be indicative of locally aggressive disease with regional spread.  The appendix does not appear grossly inflamed.  There is no free fluid in the pelvis.  Bladder, prostate gland and seminal vesicles appear unremarkable.  IMPRESSION: There is a patchy infiltrate in the right lung consistent with pneumonia.  The differential diagnosis does include pulmonary infarct due to emboli.  Worsening of the appearance in the region of the cecal tip.  It appears that the mass is progressive.  There is stranding in the regional fat, probably with some small lymph nodes.  The findings could represent inflammation following the recent colonoscopy and presumed biopsy.  Alternatively, this could indicate aggressive local progression of disease.  Small bowel loop now extends into the small ventral hernia but there is no evidence of obstruction or incarceration.  Original Report Authenticated By: Thomasenia Sales, M.D.   Dg Chest Portable 1 View  03/19/2012  *RADIOLOGY REPORT*  Clinical Data: Cough.  Shortness of breath.  PORTABLE CHEST - 1 VIEW 03/19/2012 1315 hours:  Comparison: CT chest 03/09/2012.  Findings: Airspace consolidation deep in the right lower lobe on the prior CT vaguely visible on the current examination. Consolidation in the right upper lobe has improved in the interval, though minimal patchy airspace opacity persists.  Left lung  remains clear.  Cardiac silhouette enlarged but stable.  Pulmonary vascularity normal without evidence of pulmonary edema.  IMPRESSION: Improved pneumonia in the right upper and right lower lobes, though patchy airspace opacities persist.  No new abnormalities.  Stable cardiomegaly without pulmonary edema.  Original Report Authenticated By: Arnell Sieving, M.D.    1. Abdominal pain   2. Cecal cancer   3. CAP (community acquired pneumonia)     MDM  Cecal cancer with slight worsening mass/stranding, severe abdominal pain without cause otherwise by CT A/P. No bowel obstruction, perforation. CT RLL c/w pneumonia. Will treat with levaquin. Admit for pain control, monitoring, IV abx in light of N/V. Family updated re: plan of care.        Forbes Cellar, MD 03/20/12 [phone]

## 2012-03-19 NOTE — Patient Instructions (Signed)
Go directly to 

## 2012-03-19 NOTE — ED Notes (Addendum)
Pt c/o right lower abdominal pain onset this morning. Pt denies N/V. Pt reports history of colon cancer. Pt reports pain radiates up to rib area. Pt reports feeling a lot of gas. Pt belching in triage. Pt sent from [facility].

## 2012-03-20 ENCOUNTER — Inpatient Hospital Stay (HOSPITAL_COMMUNITY): Payer: Medicare Other

## 2012-03-20 LAB — CBC
HCT: 33.1 % — ABNORMAL LOW (ref 39.0–52.0)
Hemoglobin: 11.5 g/dL — ABNORMAL LOW (ref 13.0–17.0)
MCH: 33.8 pg (ref 26.0–34.0)
MCHC: 34.7 g/dL (ref 30.0–36.0)

## 2012-03-20 LAB — APTT: aPTT: 31 seconds (ref 24–37)

## 2012-03-20 LAB — URINE CULTURE

## 2012-03-20 LAB — COMPREHENSIVE METABOLIC PANEL
Albumin: 2.4 g/dL — ABNORMAL LOW (ref 3.5–5.2)
Alkaline Phosphatase: 102 U/L (ref 39–117)
BUN: 11 mg/dL (ref 6–23)
CO2: 28 mEq/L (ref 19–32)
Chloride: 103 mEq/L (ref 96–112)
Potassium: 3.8 mEq/L (ref 3.5–5.1)
Total Bilirubin: 1.1 mg/dL (ref 0.3–1.2)

## 2012-03-20 LAB — PROTIME-INR: Prothrombin Time: 16.5 seconds — ABNORMAL HIGH (ref 11.6–15.2)

## 2012-03-20 MED ORDER — BIOTENE DRY MOUTH MT LIQD
15.0000 mL | Freq: Two times a day (BID) | OROMUCOSAL | Status: DC
  Administered 2012-03-20 – 2012-03-23 (×6): 15 mL via OROMUCOSAL

## 2012-03-20 MED ORDER — CHLORHEXIDINE GLUCONATE 0.12 % MT SOLN
15.0000 mL | Freq: Two times a day (BID) | OROMUCOSAL | Status: DC
  Administered 2012-03-21 – 2012-03-23 (×5): 15 mL via OROMUCOSAL
  Filled 2012-03-20 (×8): qty 15

## 2012-03-20 MED ORDER — GADOBENATE DIMEGLUMINE 529 MG/ML IV SOLN
20.0000 mL | Freq: Once | INTRAVENOUS | Status: AC | PRN
  Administered 2012-03-20: 20 mL via INTRAVENOUS

## 2012-03-20 MED ORDER — CHLORHEXIDINE GLUCONATE 4 % EX LIQD
1.0000 "application " | Freq: Once | CUTANEOUS | Status: AC
  Administered 2012-03-21: 1 via TOPICAL
  Filled 2012-03-20: qty 15

## 2012-03-20 MED ORDER — MORPHINE SULFATE 4 MG/ML IJ SOLN
4.0000 mg | INTRAMUSCULAR | Status: DC | PRN
  Administered 2012-03-21: 4 mg via INTRAVENOUS
  Filled 2012-03-20: qty 1

## 2012-03-20 MED ORDER — DIPHENHYDRAMINE HCL 25 MG PO CAPS
25.0000 mg | ORAL_CAPSULE | Freq: Once | ORAL | Status: AC
  Administered 2012-03-20: 25 mg via ORAL
  Filled 2012-03-20: qty 1

## 2012-03-20 MED ORDER — HEPARIN SODIUM (PORCINE) 5000 UNIT/ML IJ SOLN
5000.0000 [IU] | Freq: Once | INTRAMUSCULAR | Status: DC
  Filled 2012-03-20: qty 1

## 2012-03-20 MED ORDER — HEPARIN SODIUM (PORCINE) 5000 UNIT/ML IJ SOLN
5000.0000 [IU] | INTRAMUSCULAR | Status: DC
  Filled 2012-03-20: qty 1

## 2012-03-20 NOTE — Progress Notes (Signed)
Pt complaining of itching all over and has developed red splotches on arms and trunk.  Lenny Pastel on call for [facility] notified. Orders placed in chart.

## 2012-03-20 NOTE — Progress Notes (Signed)
Appreciate surgical input. MRI brain shows no mets.  Fever concerning.  SUBJECTIVE Developed itching after taking dilaudid this morning. Says he is still very sore in right lower quadrant.   1. Abdominal pain   2. Cecal cancer   3. CAP (community acquired pneumonia)     Past Medical History  Diagnosis Date  . GERD (gastroesophageal reflux disease)   . Pollen allergies   . Dementia   . Sleep apnea   . Unspecified essential hypertension   . Thrombocytopenia     Dr. Orlie Dakin follows  . Skin cancer     ears  . History of colon polyps   . History of gallstones   . Status post dilation of esophageal narrowing   . Colon adenocarcinoma   . Kidney stones    Current Facility-Administered Medications  Medication Dose Route Frequency Provider Last Rate Last Dose  . 0.9 %  sodium chloride infusion   Intravenous STAT Achsah Mcquade, MD 100 mL/hr at 03/19/12 1906    . acetaminophen (TYLENOL) tablet 650 mg  650 mg Oral Q6H PRN Jazlen Ogarro, MD   650 mg at 03/19/12 1906   Or  . acetaminophen (TYLENOL) suppository 650 mg  650 mg Rectal Q6H PRN Chijioke Lasser, MD      . alum & mag hydroxide-simeth (MAALOX/MYLANTA) 200-200-20 MG/5ML suspension 30 mL  30 mL Oral Q6H PRN Destan Franchini, MD      . antiseptic oral rinse (BIOTENE) solution 15 mL  15 mL Mouth Rinse q12n4p Neale Marzette, MD      . chlorhexidine (PERIDEX) 0.12 % solution 15 mL  15 mL Mouth Rinse BID Ruthann Angulo, MD      . diphenhydrAMINE (BENADRYL) capsule 25 mg  25 mg Oral Once Rolan Lipa, NP   25 mg at 03/20/12 0711  . gadobenate dimeglumine (MULTIHANCE) injection 20 mL  20 mL Intravenous Once PRN Medication Radiologist, MD   20 mL at 03/20/12 0927  . HYDROcodone-acetaminophen (NORCO) 5-325 MG per tablet 1-2 tablet  1-2 tablet Oral Q4H PRN Conley Canal, MD   2 tablet at 03/20/12 1015  . HYDROmorphone (DILAUDID) injection 1 mg  1 mg Intravenous Once Forbes Cellar, MD   1 mg at 03/19/12 1303  . HYDROmorphone (DILAUDID)  injection 1 mg  1 mg Intravenous Once Forbes Cellar, MD   1 mg at 03/19/12 1449  . iohexol (OMNIPAQUE) 300 MG/ML solution 100 mL  100 mL Intravenous Once PRN Forbes Cellar, MD   100 mL at 03/19/12 1602  . levofloxacin (LEVAQUIN) IVPB 750 mg  750 mg Intravenous Once Forbes Cellar, MD   750 mg at 03/19/12 1821  . levofloxacin (LEVAQUIN) IVPB 750 mg  750 mg Intravenous Once Yuvin Bussiere, MD      . morphine 4 MG/ML injection 4 mg  4 mg Intravenous Q4H PRN Casper Pagliuca, MD      . ondansetron (ZOFRAN) injection 4 mg  4 mg Intravenous Once Forbes Cellar, MD   4 mg at 03/19/12 1303  . ondansetron (ZOFRAN) injection 4 mg  4 mg Intravenous Q6H PRN Ripken Rekowski, MD      . ondansetron (ZOFRAN) tablet 4 mg  4 mg Oral Q6H PRN Yaroslav Gombos, MD      . pantoprazole (PROTONIX) EC tablet 40 mg  40 mg Oral Q1200 Peyton Spengler, MD      . piperacillin-tazobactam (ZOSYN) IVPB 3.375 g  3.375 g Intravenous Q8H Toren Tucholski, MD   3.375 g at 03/20/12 0412  . sodium chloride  0.9 % bolus 1,000 mL  1,000 mL Intravenous Once Forbes Cellar, MD   1,000 mL at 03/19/12 1300  . vancomycin (VANCOCIN) 1,250 mg in sodium chloride 0.9 % 250 mL IVPB  1,250 mg Intravenous Q12H Yaremi Stahlman, MD   1,250 mg at 03/19/12 2016  . zolpidem (AMBIEN) tablet 5 mg  5 mg Oral Once Rolan Lipa, NP   5 mg at 03/19/12 2322  . DISCONTD: HYDROmorphone (DILAUDID) injection 1 mg  1 mg Intravenous Q4H PRN Nevaen Tredway, MD   1 mg at 03/20/12 0548   Allergies  Allergen Reactions  . Chicken Allergy     unknown   Active Problems:  Adenocarcinoma Of Cecum  Abdominal pain, acute  HCAP (healthcare-associated pneumonia)  GERD (gastroesophageal reflux disease)   Vital signs in last 24 hours: Temp:  [97.7 F (36.5 C)-102.1 F (38.9 C)] 98 F (36.7 C) (07/07 1000) Pulse Rate:  [65-89] 70  (07/07 1000) Resp:  [16-22] 18  (07/07 1000) BP: (98-137)/(59-80) 108/72 mmHg (07/07 1000) SpO2:  [90 %-99 %] 96 % (07/07 1000) Weight:   [100.699 kg (222 lb)] 100.699 kg (222 lb) (07/06 1900) Weight change:  Last BM Date: 03/19/12  Intake/Output from previous day: 07/06 0701 - 07/07 0700 In: 1000 [I.V.:1000] Out: 100 [Urine:100] Intake/Output this shift:    Lab Results:  Basename 03/20/12 0448 03/19/12 1224  WBC 5.3 5.9  HGB 11.5* 13.8  HCT 33.1* 39.9  PLT 83* 115*   BMET  Basename 03/20/12 0448 03/19/12 1224  NA 139 142  K 3.8 3.7  CL 103 105  CO2 28 25  GLUCOSE 103* 129*  BUN 11 14  CREATININE 1.17 1.01  CALCIUM 8.2* 9.1    Studies/Results: [name] Wo Contrast  03/20/2012  *RADIOLOGY REPORT*  Clinical Data: Colon cancer.  Worsening confusion.  Assess for metastatic disease.  MRI HEAD WITHOUT AND WITH CONTRAST  Technique:  Multiplanar, multiecho pulse sequences of the brain and surrounding structures were obtained according to standard protocol without and with intravenous contrast  Contrast: 20mL MULTIHANCE GADOBENATE DIMEGLUMINE 529 MG/ML IV SOLN  Comparison: 11/26/2009  Findings: Diffusion imaging does not show any acute or subacute infarction.  There are minimal small vessel changes in the pons. No cerebellar abnormality.  The cerebral hemispheres show chronic small vessel changes in the deep and subcortical white matter.  No cortical or large vessel territory infarction.  No primary or metastatic mass lesion, hemorrhage, hydrocephalus or extra-axial collection.  After contrast administration, no abnormal enhancement occurs.  No pituitary mass.  No skull or skull base lesion.  IMPRESSION: No acute finding.  No metastatic disease or acute infarction.  Chronic small vessel changes of the hemispheric white matter, fairly typical for age.  Original Report Authenticated By: Thomasenia Sales, M.D.   Ct Abdomen Pelvis W Contrast  03/19/2012  *RADIOLOGY REPORT*  Clinical Data: Abdominal pain following recent colonoscopy.  CT ABDOMEN AND PELVIS WITH CONTRAST  Technique:  Multidetector CT imaging of the abdomen and  pelvis was performed following the standard protocol during bolus administration of intravenous contrast.  Contrast: OMNIPAQUE IOHEXOL 300 MG/ML  SOLN  Comparison: 02/20/2012  Findings: There is pulmonary infiltrate in the right lower lobe consistent with pneumonia.  No pleural or pericardial fluid.  There is a hiatal hernia.  The liver has a normal appearance without focal lesions or biliary ductal dilatation.  The left lobe is diminutive.  There is been previous cholecystectomy.  Common duct is prominent as is  typically seen following diameter.  The spleen is normal.  The pancreas shows a 10 mm cystic lesion along the ventral surface at the junction of the body and head.  This could be a small cyst or cystic neoplasm. The adrenal glands are normal.  The kidneys are normal.  The aorta shows atherosclerotic change but no aneurysm.  The IVC crosses to the left of the aorta at the level of the renal veins.  Small bowel gas pattern is normal.  There is a small ventral hernia containing a small loop of small bowel.  No sign of obstruction or inflammation.  Cecal mass is again demonstrated, possibly progressive since the previous study.  There is inflammation of the fat surrounding the cecum.  This could be due to the recent intervention.  This could also be indicative of locally aggressive disease with regional spread.  The appendix does not appear grossly inflamed.  There is no free fluid in the pelvis.  Bladder, prostate gland and seminal vesicles appear unremarkable.  IMPRESSION: There is a patchy infiltrate in the right lung consistent with pneumonia.  The differential diagnosis does include pulmonary infarct due to emboli.  Worsening of the appearance in the region of the cecal tip.  It appears that the mass is progressive.  There is stranding in the regional fat, probably with some small lymph nodes.  The findings could represent inflammation following the recent colonoscopy and presumed biopsy.   Alternatively, this could indicate aggressive local progression of disease.  Small bowel loop now extends into the small ventral hernia but there is no evidence of obstruction or incarceration.  Original Report Authenticated By: Thomasenia Sales, M.D.   Dg Chest Portable 1 View  03/19/2012  *RADIOLOGY REPORT*  Clinical Data: Cough.  Shortness of breath.  PORTABLE CHEST - 1 VIEW 03/19/2012 1315 hours:  Comparison: CT chest 03/09/2012.  Findings: Airspace consolidation deep in the right lower lobe on the prior CT vaguely visible on the current examination. Consolidation in the right upper lobe has improved in the interval, though minimal patchy airspace opacity persists.  Left lung remains clear.  Cardiac silhouette enlarged but stable.  Pulmonary vascularity normal without evidence of pulmonary edema.  IMPRESSION: Improved pneumonia in the right upper and right lower lobes, though patchy airspace opacities persist.  No new abnormalities.  Stable cardiomegaly without pulmonary edema.  Original Report Authenticated By: Arnell Sieving, M.D.    Medications: I have reviewed the patient's current medications.   Physical exam GENERAL- alert HEAD- normal atraumatic, no neck masses, normal thyroid, no jvd RESPIRATORY- appears well, vitals normal, no respiratory distress, acyanotic, normal RR, ear and throat exam is normal, neck free of mass or lymphadenopathy, chest clear, no wheezing, crepitations, rhonchi, normal symmetric air entry CVS- regular rate and rhythm, S1, S2 normal, no murmur, click, rub or gallop ABDOMEN- limited exam in RLQ due to exquisite tenderness. NEURO- Grossly normal EXTREMITIES- extremities normal, atraumatic, no cyanosis or edema  Plan   .Adenocarcinoma Of Cecum/Abdominal pain, acute- Appreciate surgical assistance. Continue mx per Dr Derrell Lolling. .HCAP (healthcare-associated pneumonia)-D2 abx to complete 7 days. Fever concerning, patient seems comfortable respiratory wise, and not  requiring supplementary oxygen. Follow Sputum culture. Marland KitchenGERD (gastroesophageal reflux disease)- continue ppi.  .Dementia- supportive care.  .dvt prophylaxis- scds. Have changed narcotics to morphine as apparent intolerance to dilaudid. May need benadryl with morphine doses.  Condition closely guarded. Discussed plan of care with Mrs Servellon, daughter in law and step son at bed side.  Mathilda Maguire 03/20/2012 11:17 AM Pager: [phone].

## 2012-03-20 NOTE — Progress Notes (Addendum)
Subjective: Patient is awake and alert, but a poor historian. Step son and daughter in law here.  Abdominal pain may be slightly worse. No nausea or vomiting.  Temp to 102 yesterday. Afebrile now. Heart rate 65. On vancomycin and Zosyn for pneumonia.  MRI brain this morning shows no metastasis. Microvascular disease noted. Discussed with Dr. Shogry and agreed.  Hemoglobin 11.5. White blood cell count 5300. Albumin 2.4. CEA 0.8 on June 25.  Objective: Vital signs in last 24 hours: Temp:  [97.7 F (36.5 C)-102.1 F (38.9 C)] 97.7 F (36.5 C) (07/07 0504) Pulse Rate:  [65-89] 65  (07/07 0504) Resp:  [16-22] 18  (07/07 0504) BP: (98-137)/(59-80) 109/64 mmHg (07/07 0504) SpO2:  [90 %-99 %] 95 % (07/07 0504) Weight:  [222 lb (100.699 kg)] 222 lb (100.699 kg) (07/06 1900) Last BM Date: 03/19/12  Intake/Output from previous day: 07/06 0701 - 07/07 0700 In: 1000 [I.V.:1000] Out: 100 [Urine:100] Intake/Output this shift:    General appearance: awake. Appears elderly and somewhat deconditioned. Answers most questions appropriately but gets confused. Does not appear to be in severe distress, but does complain of pain. Resp: decreased breath sounds on the right side compared to the left. No rhonchi or wheeze. SaO2 95% GI: tender right lower quadrant  more than left lower quadrant. Some guarding. No mass. Soft upper abdomen. Not distended. Well-healed scars from previous cholecystectomy.  Lab Results:  Results for orders placed during the hospital encounter of 03/19/12 (from the past 24 hour(s))  CBC WITH DIFFERENTIAL     Status: Abnormal   Collection Time   03/19/12 12:24 PM      Component Value Range   WBC 5.9  4.0 - 10.5 K/uL   RBC 4.16 (*) 4.22 - 5.81 MIL/uL   Hemoglobin 13.8  13.0 - 17.0 g/dL   HCT 39.9  39.0 - 52.0 %   MCV 95.9  78.0 - 100.0 fL   MCH 33.2  26.0 - 34.0 pg   MCHC 34.6  30.0 - 36.0 g/dL   RDW 13.5  11.5 - 15.5 %   Platelets 115 (*) 150 - 400 K/uL   Neutrophils Relative 71  43 - 77 %   Lymphocytes Relative 18  12 - 46 %   Monocytes Relative 10  3 - 12 %   Eosinophils Relative 1  0 - 5 %   Basophils Relative 0  0 - 1 %   Neutro Abs 4.1  1.7 - 7.7 K/uL   Lymphs Abs 1.1  0.7 - 4.0 K/uL   Monocytes Absolute 0.6  0.1 - 1.0 K/uL   Eosinophils Absolute 0.1  0.0 - 0.7 K/uL   Basophils Absolute 0.0  0.0 - 0.1 K/uL   Smear Review LARGE PLATELETS PRESENT    BASIC METABOLIC PANEL     Status: Abnormal   Collection Time   03/19/12 12:24 PM      Component Value Range   Sodium 142  135 - 145 mEq/L   Potassium 3.7  3.5 - 5.1 mEq/L   Chloride 105  96 - 112 mEq/L   CO2 25  19 - 32 mEq/L   Glucose, Bld 129 (*) 70 - 99 mg/dL   BUN 14  6 - 23 mg/dL   Creatinine, Ser 1.01  0.50 - 1.35 mg/dL   Calcium 9.1  8.4 - 10.5 mg/dL   GFR calc non Af Amer 70 (*) >90 mL/min   GFR calc Af Amer 81 (*) >90 mL/min  LIPASE, BLOOD       Status: Normal   Collection Time   03/19/12  1:00 PM      Component Value Range   Lipase 36  11 - 59 U/L  LACTIC ACID, PLASMA     Status: Normal   Collection Time   03/19/12  1:01 PM      Component Value Range   Lactic Acid, Venous 1.2  0.5 - 2.2 mmol/L  URINALYSIS, ROUTINE W REFLEX MICROSCOPIC     Status: Abnormal   Collection Time   03/19/12  6:53 PM      Component Value Range   Color, Urine YELLOW  YELLOW   APPearance CLEAR  CLEAR   Specific Gravity, Urine >1.046 (*) 1.005 - 1.030   pH 5.0  5.0 - 8.0   Glucose, UA NEGATIVE  NEGATIVE mg/dL   Hgb urine dipstick NEGATIVE  NEGATIVE   Bilirubin Urine NEGATIVE  NEGATIVE   Ketones, ur NEGATIVE  NEGATIVE mg/dL   Protein, ur NEGATIVE  NEGATIVE mg/dL   Urobilinogen, UA 0.2  0.0 - 1.0 mg/dL   Nitrite NEGATIVE  NEGATIVE   Leukocytes, UA NEGATIVE  NEGATIVE  COMPREHENSIVE METABOLIC PANEL     Status: Abnormal   Collection Time   03/20/12  4:48 AM      Component Value Range   Sodium 139  135 - 145 mEq/L   Potassium 3.8  3.5 - 5.1 mEq/L   Chloride 103  96 - 112 mEq/L   CO2 28  19 - 32  mEq/L   Glucose, Bld 103 (*) 70 - 99 mg/dL   BUN 11  6 - 23 mg/dL   Creatinine, Ser 1.17  0.50 - 1.35 mg/dL   Calcium 8.2 (*) 8.4 - 10.5 mg/dL   Total Protein 6.1  6.0 - 8.3 g/dL   Albumin 2.4 (*) 3.5 - 5.2 g/dL   AST 48 (*) 0 - 37 U/L   ALT 50  0 - 53 U/L   Alkaline Phosphatase 102  39 - 117 U/L   Total Bilirubin 1.1  0.3 - 1.2 mg/dL   GFR calc non Af Amer 58 (*) >90 mL/min   GFR calc Af Amer 68 (*) >90 mL/min  PROTIME-INR     Status: Abnormal   Collection Time   03/20/12  4:48 AM      Component Value Range   Prothrombin Time 16.5 (*) 11.6 - 15.2 seconds   INR 1.31  0.00 - 1.49  CBC     Status: Abnormal   Collection Time   03/20/12  4:48 AM      Component Value Range   WBC 5.3  4.0 - 10.5 K/uL   RBC 3.40 (*) 4.22 - 5.81 MIL/uL   Hemoglobin 11.5 (*) 13.0 - 17.0 g/dL   HCT 33.1 (*) 39.0 - 52.0 %   MCV 97.4  78.0 - 100.0 fL   MCH 33.8  26.0 - 34.0 pg   MCHC 34.7  30.0 - 36.0 g/dL   RDW 13.8  11.5 - 15.5 %   Platelets 83 (*) 150 - 400 K/uL  APTT     Status: Normal   Collection Time   03/20/12  4:48 AM      Component Value Range   aPTT 31  24 - 37 seconds     Studies/Results: @RISRSLT24@     . sodium chloride   Intravenous STAT  . antiseptic oral rinse  15 mL Mouth Rinse q12n4p  . chlorhexidine  15 mL Mouth Rinse BID  . diphenhydrAMINE  25   mg Oral Once  . HYDROmorphone  1 mg Intravenous Once  . HYDROmorphone  1 mg Intravenous Once  . levofloxacin (LEVAQUIN) IV  750 mg Intravenous Once  . levofloxacin (LEVAQUIN) IV  750 mg Intravenous Once  . ondansetron (ZOFRAN) IV  4 mg Intravenous Once  . pantoprazole  40 mg Oral Q1200  . piperacillin-tazobactam (ZOSYN)  IV  3.375 g Intravenous Q8H  . sodium chloride  1,000 mL Intravenous Once  . vancomycin  1,250 mg Intravenous Q12H  . zolpidem  5 mg Oral Once     Assessment/Plan:   Acute abdominal pain, probably associated cecal cancer. Physical exam suggests peritoneal irritation, but no evidence of perforation or abscess  or obstruction on CT. Will need right colectomy and we'll tentatively plan for this to be done by Dr. Douglas Blackman tomorrow. This may be possible laparoscopically, but he is high risk for converting to an open operation.  I do not think that he would tolerate a bowel prep with the type of pain that he is having, but this will probably be reasonable to do without prep  Right-sided pneumonia. On Zosyn and vancomycin. This should also be adequate coverage for abdominal surgery. We'll get a chest x-ray today.  Dementia. Suspect this will deteriorate transiently in the postop period. No mets on brain MRI today.  Status post laparoscopic cholecystectomy.  History sleep apnea. Will need to be in a more monitor setting postop.  I have discussed all these issues with the son-in-law and daughter-in-law and patient. I have discussed the indications and details of surgery with the patient and his family. Indications, details, techniques, and numerous risks of surgery were outlined. All their questions were answered. They seem to understand these issues. They agree with this plan.    LOS: 1 day    Jennier Schissler M. Alajah Witman, M.D., FACS [facility] General and Minimally invasive Surgery Breast and Colorectal Surgery Office:   [phone] Pager:   [phone]  03/20/2012  . .prob   

## 2012-03-21 ENCOUNTER — Inpatient Hospital Stay (HOSPITAL_COMMUNITY): Payer: Medicare Other | Admitting: Anesthesiology

## 2012-03-21 ENCOUNTER — Encounter (HOSPITAL_COMMUNITY)
Admission: EM | Disposition: A | Discharge status: Skilled Nursing Facility | Payer: Self-pay | Source: Ambulatory Visit | Attending: Internal Medicine

## 2012-03-21 ENCOUNTER — Encounter (HOSPITAL_COMMUNITY): Payer: Self-pay | Admitting: Anesthesiology

## 2012-03-21 HISTORY — PX: PARTIAL COLECTOMY: SHX5273

## 2012-03-21 LAB — BASIC METABOLIC PANEL
BUN: 9 mg/dL (ref 6–23)
Creatinine, Ser: 1.06 mg/dL (ref 0.50–1.35)
GFR calc Af Amer: 76 mL/min — ABNORMAL LOW (ref >90)
GFR calc non Af Amer: 66 mL/min — ABNORMAL LOW (ref >90)
Potassium: 3.4 mEq/L — ABNORMAL LOW (ref 3.5–5.1)

## 2012-03-21 LAB — CBC
HCT: 33.3 % — ABNORMAL LOW (ref 39.0–52.0)
MCHC: 34.2 g/dL (ref 30.0–36.0)
Platelets: 85 10*3/uL — ABNORMAL LOW (ref 150–400)
RDW: 13.9 % (ref 11.5–15.5)
WBC: 5.5 10*3/uL (ref 4.0–10.5)

## 2012-03-21 LAB — TYPE AND SCREEN
ABO/RH(D): A NEG
Antibody Screen: NEGATIVE

## 2012-03-21 LAB — ABO/RH: ABO/RH(D): A NEG

## 2012-03-21 LAB — SURGICAL PCR SCREEN: MRSA, PCR: NEGATIVE

## 2012-03-21 SURGERY — COLECTOMY, RIGHT, LAPAROSCOPIC
Anesthesia: General | Site: Abdomen | Laterality: Right | Wound class: Clean Contaminated

## 2012-03-21 MED ORDER — HYDROMORPHONE HCL PF 1 MG/ML IJ SOLN
INTRAMUSCULAR | Status: AC
  Filled 2012-03-21: qty 1

## 2012-03-21 MED ORDER — 0.9 % SODIUM CHLORIDE (POUR BTL) OPTIME
TOPICAL | Status: DC | PRN
  Administered 2012-03-21 (×2): 2000 mL

## 2012-03-21 MED ORDER — HYDROMORPHONE HCL PF 1 MG/ML IJ SOLN
0.2500 mg | INTRAMUSCULAR | Status: DC | PRN
  Administered 2012-03-21 (×4): 0.5 mg via INTRAVENOUS

## 2012-03-21 MED ORDER — SODIUM CHLORIDE 0.9 % IV SOLN
INTRAVENOUS | Status: DC | PRN
  Administered 2012-03-21: 10:00:00 via INTRAVENOUS

## 2012-03-21 MED ORDER — LORAZEPAM 2 MG/ML IJ SOLN
1.0000 mg | Freq: Once | INTRAMUSCULAR | Status: AC | PRN
  Administered 2012-03-21: 1 mg via INTRAVENOUS

## 2012-03-21 MED ORDER — HYDROMORPHONE HCL PF 1 MG/ML IJ SOLN: 1.0000 mg | INTRAMUSCULAR | Status: DC | PRN

## 2012-03-21 MED ORDER — LACTATED RINGERS IV SOLN
INTRAVENOUS | Status: DC | PRN
  Administered 2012-03-21: 10:00:00 via INTRAVENOUS

## 2012-03-21 MED ORDER — DIPHENHYDRAMINE HCL 50 MG/ML IJ SOLN
12.5000 mg | Freq: Four times a day (QID) | INTRAMUSCULAR | Status: DC | PRN
  Administered 2012-03-22 – 2012-03-23 (×2): 12.5 mg via INTRAVENOUS
  Filled 2012-03-21 (×3): qty 1

## 2012-03-21 MED ORDER — HYDROMORPHONE 0.3 MG/ML IV SOLN
INTRAVENOUS | Status: AC
  Filled 2012-03-21: qty 25

## 2012-03-21 MED ORDER — ONDANSETRON HCL 4 MG PO TABS: 4.0000 mg | ORAL_TABLET | Freq: Four times a day (QID) | ORAL | Status: DC | PRN

## 2012-03-21 MED ORDER — FENTANYL CITRATE 0.05 MG/ML IJ SOLN
INTRAMUSCULAR | Status: DC | PRN
  Administered 2012-03-21: 100 ug via INTRAVENOUS
  Administered 2012-03-21 (×3): 50 ug via INTRAVENOUS

## 2012-03-21 MED ORDER — BUPIVACAINE-EPINEPHRINE 0.25% -1:200000 IJ SOLN
INTRAMUSCULAR | Status: DC | PRN
  Administered 2012-03-21: 20 mL

## 2012-03-21 MED ORDER — LORAZEPAM 2 MG/ML IJ SOLN
INTRAMUSCULAR | Status: AC
  Filled 2012-03-21: qty 1

## 2012-03-21 MED ORDER — ACETAMINOPHEN 10 MG/ML IV SOLN
INTRAVENOUS | Status: DC | PRN
  Administered 2012-03-21: 1000 mg via INTRAVENOUS

## 2012-03-21 MED ORDER — SUCCINYLCHOLINE CHLORIDE 20 MG/ML IJ SOLN
INTRAMUSCULAR | Status: DC | PRN
  Administered 2012-03-21: 100 mg via INTRAVENOUS

## 2012-03-21 MED ORDER — ONDANSETRON HCL 4 MG/2ML IJ SOLN: 4.0000 mg | Freq: Four times a day (QID) | INTRAMUSCULAR | Status: DC | PRN

## 2012-03-21 MED ORDER — HYDROMORPHONE 0.3 MG/ML IV SOLN
INTRAVENOUS | Status: DC
  Administered 2012-03-21: 0.2 mg via INTRAVENOUS
  Administered 2012-03-21: 18:00:00 via INTRAVENOUS
  Administered 2012-03-22: 0.599 mg via INTRAVENOUS
  Administered 2012-03-22: 0.2 mg via INTRAVENOUS
  Administered 2012-03-22 (×2): 0.799 mg via INTRAVENOUS
  Administered 2012-03-22: 0.2 mg via INTRAVENOUS
  Administered 2012-03-23: 0.4 mg via INTRAVENOUS
  Filled 2012-03-21: qty 25

## 2012-03-21 MED ORDER — LIDOCAINE HCL 1 % IJ SOLN
INTRAMUSCULAR | Status: DC | PRN
  Administered 2012-03-21: 100 mg via INTRADERMAL

## 2012-03-21 MED ORDER — ROCURONIUM BROMIDE 100 MG/10ML IV SOLN
INTRAVENOUS | Status: DC | PRN
  Administered 2012-03-21: 50 mg via INTRAVENOUS

## 2012-03-21 MED ORDER — MIDAZOLAM HCL 2 MG/2ML IJ SOLN: 1.0000 mg | INTRAMUSCULAR | Status: DC | PRN

## 2012-03-21 MED ORDER — VECURONIUM BROMIDE 10 MG IV SOLR
INTRAVENOUS | Status: DC | PRN
  Administered 2012-03-21: 4 mg via INTRAVENOUS

## 2012-03-21 MED ORDER — PANTOPRAZOLE SODIUM 40 MG IV SOLR
40.0000 mg | INTRAVENOUS | Status: DC
  Administered 2012-03-21 – 2012-03-25 (×5): 40 mg via INTRAVENOUS
  Filled 2012-03-21 (×6): qty 40

## 2012-03-21 MED ORDER — BUPIVACAINE-EPINEPHRINE PF 0.25-1:200000 % IJ SOLN
INTRAMUSCULAR | Status: AC
  Filled 2012-03-21: qty 30

## 2012-03-21 MED ORDER — SODIUM CHLORIDE 0.9 % IJ SOLN: 9.0000 mL | INTRAMUSCULAR | Status: DC | PRN

## 2012-03-21 MED ORDER — GLYCOPYRROLATE 0.2 MG/ML IJ SOLN
INTRAMUSCULAR | Status: DC | PRN
  Administered 2012-03-21: .6 mg via INTRAVENOUS

## 2012-03-21 MED ORDER — ACETAMINOPHEN 10 MG/ML IV SOLN
INTRAVENOUS | Status: AC
  Filled 2012-03-21: qty 100

## 2012-03-21 MED ORDER — FENTANYL CITRATE 0.05 MG/ML IJ SOLN: 50.0000 ug | INTRAMUSCULAR | Status: DC | PRN

## 2012-03-21 MED ORDER — DIPHENHYDRAMINE HCL 12.5 MG/5ML PO ELIX
12.5000 mg | ORAL_SOLUTION | Freq: Four times a day (QID) | ORAL | Status: DC | PRN
  Filled 2012-03-21: qty 5

## 2012-03-21 MED ORDER — ONDANSETRON HCL 4 MG/2ML IJ SOLN
INTRAMUSCULAR | Status: DC | PRN
  Administered 2012-03-21: 4 mg via INTRAVENOUS

## 2012-03-21 MED ORDER — PROPOFOL 10 MG/ML IV EMUL
INTRAVENOUS | Status: DC | PRN
  Administered 2012-03-21: 150 mg via INTRAVENOUS

## 2012-03-21 MED ORDER — ACETAMINOPHEN 10 MG/ML IV SOLN
1000.0000 mg | Freq: Four times a day (QID) | INTRAVENOUS | Status: AC
  Administered 2012-03-21 – 2012-03-22 (×3): 1000 mg via INTRAVENOUS
  Filled 2012-03-21 (×3): qty 100

## 2012-03-21 MED ORDER — NEOSTIGMINE METHYLSULFATE 1 MG/ML IJ SOLN
INTRAMUSCULAR | Status: DC | PRN
  Administered 2012-03-21: 5 mg via INTRAVENOUS

## 2012-03-21 MED ORDER — NALOXONE HCL 0.4 MG/ML IJ SOLN: 0.4000 mg | INTRAMUSCULAR | Status: DC | PRN

## 2012-03-21 MED ORDER — POTASSIUM CHLORIDE IN NACL 20-0.9 MEQ/L-% IV SOLN
INTRAVENOUS | Status: DC
  Administered 2012-03-21: 1000 mL via INTRAVENOUS
  Administered 2012-03-22 – 2012-03-23 (×3): via INTRAVENOUS
  Filled 2012-03-21 (×7): qty 1000

## 2012-03-21 MED ORDER — SODIUM CHLORIDE 0.9 % IJ SOLN
INTRAMUSCULAR | Status: AC
  Administered 2012-03-21: 23:00:00
  Filled 2012-03-21: qty 10

## 2012-03-21 SURGICAL SUPPLY — 58 items
BLADE SURG ROTATE 9660 (MISCELLANEOUS) ×2 IMPLANT
CANISTER SUCTION 2500CC (MISCELLANEOUS) ×4 IMPLANT
CELLS DAT CNTRL 66122 CELL SVR (MISCELLANEOUS) ×1 IMPLANT
CHLORAPREP W/TINT 26ML (MISCELLANEOUS) ×2 IMPLANT
CLOTH BEACON ORANGE TIMEOUT ST (SAFETY) ×2 IMPLANT
COVER SURGICAL LIGHT HANDLE (MISCELLANEOUS) ×2 IMPLANT
DRAPE INCISE IOBAN 66X45 STRL (DRAPES) IMPLANT
DRAPE LAPAROSCOPIC ABDOMINAL (DRAPES) IMPLANT
DRAPE PROXIMA HALF (DRAPES) IMPLANT
DRAPE WARM FLUID 44X44 (DRAPE) ×2 IMPLANT
ELECT BLADE 6.5 EXT (BLADE) IMPLANT
ELECT CAUTERY BLADE 6.4 (BLADE) ×2 IMPLANT
ELECT REM PT RETURN 9FT ADLT (ELECTROSURGICAL) ×2
ELECTRODE REM PT RTRN 9FT ADLT (ELECTROSURGICAL) ×1 IMPLANT
GLOVE BIO SURGEON STRL SZ7 (GLOVE) ×4 IMPLANT
GLOVE BIO SURGEON STRL SZ7.5 (GLOVE) ×4 IMPLANT
GLOVE BIOGEL PI IND STRL 7.0 (GLOVE) ×1 IMPLANT
GLOVE BIOGEL PI IND STRL 7.5 (GLOVE) ×2 IMPLANT
GLOVE BIOGEL PI INDICATOR 7.0 (GLOVE) ×1
GLOVE BIOGEL PI INDICATOR 7.5 (GLOVE) ×2
GLOVE ECLIPSE 7.0 STRL STRAW (GLOVE) ×4 IMPLANT
GLOVE SURG SIGNA 7.5 PF LTX (GLOVE) ×4 IMPLANT
GOWN PREVENTION PLUS XLARGE (GOWN DISPOSABLE) ×2 IMPLANT
GOWN STRL NON-REIN LRG LVL3 (GOWN DISPOSABLE) ×6 IMPLANT
KIT BASIN OR (CUSTOM PROCEDURE TRAY) ×2 IMPLANT
KIT ROOM TURNOVER OR (KITS) ×2 IMPLANT
LEGGING LITHOTOMY PAIR STRL (DRAPES) IMPLANT
LIGASURE IMPACT 36 18CM CVD LR (INSTRUMENTS) IMPLANT
NS IRRIG 1000ML POUR BTL (IV SOLUTION) ×8 IMPLANT
PACK GENERAL/GYN (CUSTOM PROCEDURE TRAY) IMPLANT
PAD ARMBOARD 7.5X6 YLW CONV (MISCELLANEOUS) ×6 IMPLANT
RELOAD PROXIMATE 75MM BLUE (ENDOMECHANICALS) ×4 IMPLANT
RTRCTR WOUND ALEXIS 18CM MED (MISCELLANEOUS) ×2
SEALER TISSUE X1 CVD JAW (INSTRUMENTS) ×2 IMPLANT
SPECIMEN JAR X LARGE (MISCELLANEOUS) IMPLANT
SPONGE GAUZE 4X4 12PLY (GAUZE/BANDAGES/DRESSINGS) IMPLANT
SPONGE LAP 18X18 X RAY DECT (DISPOSABLE) IMPLANT
STAPLER GUN LINEAR PROX 60 (STAPLE) ×4 IMPLANT
STAPLER PROXIMATE 75MM BLUE (STAPLE) ×2 IMPLANT
STAPLER VISISTAT 35W (STAPLE) ×2 IMPLANT
SUCTION POOLE TIP (SUCTIONS) ×2 IMPLANT
SURGILUBE 2OZ TUBE FLIPTOP (MISCELLANEOUS) IMPLANT
SUT PROLENE 2 0 CT2 30 (SUTURE) IMPLANT
SUT PROLENE 2 0 KS (SUTURE) IMPLANT
SUT SILK 2 0 SH CR/8 (SUTURE) ×2 IMPLANT
SUT SILK 2 0 TIES 10X30 (SUTURE) IMPLANT
SUT SILK 3 0 SH CR/8 (SUTURE) ×4 IMPLANT
SUT SILK 3 0 TIES 10X30 (SUTURE) IMPLANT
SUT VIC AB 3-0 SH 18 (SUTURE) IMPLANT
TAPE CLOTH SURG 4X10 WHT LF (GAUZE/BANDAGES/DRESSINGS) ×2 IMPLANT
TOWEL OR 17X24 6PK STRL BLUE (TOWEL DISPOSABLE) IMPLANT
TOWEL OR 17X26 10 PK STRL BLUE (TOWEL DISPOSABLE) ×2 IMPLANT
TRAY FOLEY CATH 14FRSI W/METER (CATHETERS) ×2 IMPLANT
TRAY PROCTOSCOPIC FIBER OPTIC (SET/KITS/TRAYS/PACK) IMPLANT
TROCAR XCEL BLUNT TIP 100MML (ENDOMECHANICALS) ×2 IMPLANT
UNDERPAD 30X30 INCONTINENT (UNDERPADS AND DIAPERS) IMPLANT
WATER STERILE IRR 1000ML POUR (IV SOLUTION) IMPLANT
YANKAUER SUCT BULB TIP NO VENT (SUCTIONS) ×2 IMPLANT

## 2012-03-21 NOTE — Anesthesia Postprocedure Evaluation (Signed)
  Anesthesia Post-op Note  Patient: John Summers  Procedure(s) Performed: Procedure(s) (LRB): LAPAROSCOPIC RIGHT COLECTOMY (Right) PARTIAL COLECTOMY (Right)  Patient Location: PACU  Anesthesia Type: General  Level of Consciousness: awake  Airway and Oxygen Therapy: Patient Spontanous Breathing  Post-op Pain: mild  Post-op Assessment: Post-op Vital signs reviewed, Patient's Cardiovascular Status Stable, Respiratory Function Stable, Patent Airway, No signs of Nausea or vomiting and Pain level controlled  Post-op Vital Signs: stable  Complications: No apparent anesthesia complications

## 2012-03-21 NOTE — Progress Notes (Signed)
Called Dr. Corliss Skains regarding heparin order. Pt's platelets low (85). Order received to D/C heparin.

## 2012-03-21 NOTE — Interval H&P Note (Signed)
History and Physical Interval Note:  No change in history or exam.  Patient with right colon cancer and incarcerated hernia  03/21/2012 7:28 AM  John Summers  has presented today for surgery, with the diagnosis of colon ca  The various methods of treatment have been discussed with the patient and family. After consideration of risks, benefits and other options for treatment, the patient has consented to  Procedure(s) (LRB): LAPAROSCOPIC RIGHT COLECTOMY (Right) PARTIAL COLECTOMY (Right) as a surgical intervention .  [name] history has been reviewed, patient examined, no change in status, stable for surgery.  I have reviewed the patients' chart and labs.  Questions were answered to the patient's satisfaction.    Again, I discussed the procedure in detail with the patient.  The risks include, but are not limited to bleeding, infection, injury to surrounding structures, anastomotic leak, etc.  Wylie Coon A

## 2012-03-21 NOTE — Anesthesia Preprocedure Evaluation (Addendum)
Anesthesia Evaluation  Patient identified by MRN, date of birth, ID band Patient awake    Reviewed: Allergy & Precautions, H&P , NPO status , Patient's Chart, lab work & pertinent test results  Airway Mallampati: II TM Distance: >3 FB Neck ROM: Full    Dental   Pulmonary sleep apnea , pneumonia - (healthcare-aquired pneumonia ), unresolved, former smoker,  Pt admit through ED 03/19/12 with abdominal pain and PNA. Pt has been on Vanc and Zosyn for PNA since admission. Chest xray 03/19/12 for PNA Interval increase in ill-defined heterogeneous opacities within the right upper lung, worrisome for progression of infection. Continued attention on follow-up is recommended.  + rhonchi         Cardiovascular hypertension, Pt. on medications     Neuro/Psych PSYCHIATRIC DISORDERS (dementia) Dementia     GI/Hepatic Neg liver ROS, GERD-  Medicated and Controlled,Recent esophageal dilatation Cecal/colon CA   Endo/Other  negative endocrine ROS  Renal/GU negative Renal ROS     Musculoskeletal negative musculoskeletal ROS (+)   Abdominal (+) + obese,   Peds  Hematology  (+) Blood dyscrasia (thrombocytopenia ), , Low plts   Anesthesia Other Findings   Reproductive/Obstetrics negative OB ROS                       Anesthesia Physical Anesthesia Plan  ASA: III  Anesthesia Plan: General   Post-op Pain Management:    Induction: Intravenous  Airway Management Planned: Oral ETT  Additional Equipment:   Intra-op Plan:   Post-operative Plan: Possible Post-op intubation/ventilation  Informed Consent: I have reviewed the patients History and Physical, chart, labs and discussed the procedure including the risks, benefits and alternatives for the proposed anesthesia with the patient or authorized representative who has indicated his/her understanding and acceptance.     Plan Discussed with: CRNA and  Surgeon  Anesthesia Plan Comments:         Anesthesia Quick Evaluation

## 2012-03-21 NOTE — Transfer of Care (Signed)
Immediate Anesthesia Transfer of Care Note  Patient: John Summers  Procedure(s) Performed: Procedure(s) (LRB): LAPAROSCOPIC RIGHT COLECTOMY (Right) PARTIAL COLECTOMY (Right)  Patient Location: PACU  Anesthesia Type: General  Level of Consciousness: awake, alert  and patient cooperative  Airway & Oxygen Therapy: Patient Spontanous Breathing and Patient connected to face mask oxygen  Post-op Assessment: Report given to PACU RN and Post -op Vital signs reviewed and stable  Post vital signs: Reviewed and stable  Complications: No apparent anesthesia complications

## 2012-03-21 NOTE — Op Note (Signed)
LAPAROSCOPIC RIGHT COLECTOMY, PARTIAL COLECTOMY  Procedure Note  John Summers 03/19/2012 - 03/21/2012   Pre-op Diagnosis: Colon cancer     Post-op Diagnosis: same  Procedure(s): LAPAROSCOPIC ASSISTED RIGHT PARTIAL COLECTOMY  Surgeon(s): Shelly Rubenstein, MD  Anesthesia: General  Staff:  Eppie Gibson, RN - Circulator Janeece Agee Pingue, CST - Scrub Person Verta Ellen, RN - Scrub Person Megan Day McCall, RN - Circulator Assistant Ezzard Flax, RN - Relief Scrub Sudie Bailey, RN - Relief Circulator  Estimated Blood Loss: Minimal               Specimens: right colon, omental nodule         Indications: This is a 78 year old gentleman with a 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 Because of increasing abdominal pain he was admitted. A CAT scan showed either progression of the disease or inflammatory process. He also had a hernia above the umbilicus containing bowel. The decision was made to proceed with left partial colectomy.  Findings: The patient appeared to have a focal perforation of the cecum which involve itself off. This did not appear to be cancer causing the perforation. No gross purulence or abscess was identified.  Procedure: The patient was brought to the operating room and identified as the correct patient. He was placed supine on the operating room table and general anesthesia was induced. His abdomen draped in the usual sterile fashion. A small incision was then made below the umbilicus. I carried this down to the fascia which was then opened with a scalpel. A hemostat was then used to pass the peritoneal cavity under direct vision. A 0 Vicryl perching suture was then placed around the fascial opening. The [facility] port was placed through the opening and insufflation of the abdomen was begun. I placed another trocar in the patient's upper midline and one in the lower midline under direct vision. These were 5 mm trochars. The small bowel had  already reduced out of the hernia above the umbilicus. I identified the cecum and small bowel which was stuck to the abdominal wall. This came down easily with blunt dissection. There was fibrinous exudate consistent with perforation. I mobilized the entire right colon along the white line of T[place]t with the harmonic scalpel. Once it appeared that I had probably of colon mobilized I converted to an open procedure. All ports were removed and the abdomen was deflated. I connected the incision at the umbilical port with the one in the epigastrium to incorporate the hernia defect. I opened up the fascia the entirely to the incision. I then placed a wound protector. I was then able to eviscerate the right colon small bowel. Again, there was fibrinous exudate was small bowel stuck to the cecum but I did not see evidence of gross perforation of the cancer. I transected the distal ileum with a GIA-75 stapler. I then transected the distal right colon with the GIA-75 stapler as well. I then took down the connecting mesentery with the super jaw cautery device. The specimen was then sent to pathology for evaluation. I removed 1 omental nodule as well which was sent to pathology for evaluation. I placed several silk sutures and the mesentery to achieve hemostasis. I then reapproximated the small bowel to the proximal transverse colon with interrupted silk sutures. I then created an enterotomy and colotomy with the cautery device. I then performed a side to side anastomosis with a GIA-75 stapler. I evaluated the staple line and hemostasis appeared to  be achieved. I then closed the opening with 2 separate firings of the TA 60 stapler. A wide anastomosis appeared to be achieved which was pink and well perfused. I did reinforce the suture line with silk sutures. I closed the mesenteric defect with interrupted silk sutures as well. Again evaluated anastomosis which appeared clean. I then irrigated the abdomen with several liters of  normal saline. I evaluated the liver and found no evidence of gross metastatic disease. Hemostasis appeared to be achieved. I then closed the patient's midline fascia with a running #1 looped PDS suture. The skin was then irrigated and anesthetized with Marcaine and then closed with skin staples. Gauze and tape were applied. The patient tolerated the procedure well. All the counts were correct at the end of the procedure. The patient was then extubated in the operating room and taken in a stable condition to the recovery room. [name]   Date: 03/21/2012  Time: 11:45 AM

## 2012-03-21 NOTE — Progress Notes (Signed)
Post op today. Still sleepy. Appreciate surgery. SUBJECTIVE "I was doing well till you came by and touched my belly".  1. Abdominal pain   2. Cecal cancer   3. CAP (community acquired pneumonia)     Past Medical History  Diagnosis Date  . GERD (gastroesophageal reflux disease)   . Pollen allergies   . Dementia   . Sleep apnea   . Unspecified essential hypertension   . Thrombocytopenia     Dr. Orlie Dakin follows  . Skin cancer     ears  . History of colon polyps   . History of gallstones   . Status post dilation of esophageal narrowing   . Colon adenocarcinoma   . Kidney stones    Current Facility-Administered Medications  Medication Dose Route Frequency Provider Last Rate Last Dose  . 0.9 % NaCl with KCl 20 mEq/ L  infusion   Intravenous Continuous Shelly Rubenstein, MD 125 mL/hr at 03/21/12 1535 1,000 mL at 03/21/12 1535  . acetaminophen (OFIRMEV) 10 MG/ML IV           . acetaminophen (OFIRMEV) IV 1,000 mg  1,000 mg Intravenous Q6H Shelly Rubenstein, MD   1,000 mg at 03/21/12 1358  . antiseptic oral rinse (BIOTENE) solution 15 mL  15 mL Mouth Rinse q12n4p Mikya Don, MD   15 mL at 03/20/12 1130  . chlorhexidine (HIBICLENS) 4 % liquid 1 application  1 application Topical Once Ernestene Mention, MD   1 application at 03/21/12 [phone]  . chlorhexidine (PERIDEX) 0.12 % solution 15 mL  15 mL Mouth Rinse BID Idris Edmundson, MD      . diphenhydrAMINE (BENADRYL) injection 12.5 mg  12.5 mg Intravenous Q6H PRN Shelly Rubenstein, MD       Or  . diphenhydrAMINE (BENADRYL) 12.5 MG/5ML elixir 12.5 mg  12.5 mg Oral Q6H PRN Shelly Rubenstein, MD      . fentaNYL (SUBLIMAZE) injection 50-100 mcg  50-100 mcg Intravenous PRN Bedelia Person, MD      . HYDROmorphone (DILAUDID) 1 MG/ML injection           . HYDROmorphone (DILAUDID) 1 MG/ML injection           . HYDROmorphone (DILAUDID) injection 1 mg  1 mg Intravenous Q1H PRN Shelly Rubenstein, MD      . HYDROmorphone (DILAUDID) PCA injection 0.3  mg/mL   Intravenous Q4H Shelly Rubenstein, MD      . levofloxacin (LEVAQUIN) IVPB 750 mg  750 mg Intravenous Once Shiva Karis, MD   750 mg at 03/20/12 1725  . LORazepam (ATIVAN) 2 MG/ML injection           . LORazepam (ATIVAN) injection 1 mg  1 mg Intravenous Once PRN Bedelia Person, MD   1 mg at 03/21/12 1308  . midazolam (VERSED) injection 1-2 mg  1-2 mg Intravenous PRN Bedelia Person, MD      . naloxone [facility]) injection 0.4 mg  0.4 mg Intravenous PRN Shelly Rubenstein, MD       And  . sodium chloride 0.9 % injection 9 mL  9 mL Intravenous PRN Shelly Rubenstein, MD      . ondansetron [facility]) injection 4 mg  4 mg Intravenous Q6H PRN Montie Gelardi, MD      . ondansetron (ZOFRAN) injection 4 mg  4 mg Intravenous Q6H PRN Shelly Rubenstein, MD      . pantoprazole (PROTONIX) injection 40 mg  40 mg Intravenous Q24H Douglas A  Magnus Ivan, MD      . piperacillin-tazobactam (ZOSYN) IVPB 3.375 g  3.375 g Intravenous Q8H Jaysion Ramseyer, MD   1.75 g at 03/21/12 0959  . vancomycin (VANCOCIN) 1,250 mg in sodium chloride 0.9 % 250 mL IVPB  1,250 mg Intravenous Q12H Allona Gondek, MD   1,250 mg at 03/21/12 1015  . DISCONTD: 0.9 % irrigation (POUR BTL)    PRN Shelly Rubenstein, MD   2,000 mL at 03/21/12 1127  . DISCONTD: acetaminophen (TYLENOL) suppository 650 mg  650 mg Rectal Q6H PRN Annie Saephan, MD      . DISCONTD: acetaminophen (TYLENOL) tablet 650 mg  650 mg Oral Q6H PRN Darlean Warmoth, MD   650 mg at 03/19/12 1906  . DISCONTD: alum & mag hydroxide-simeth (MAALOX/MYLANTA) 200-200-20 MG/5ML suspension 30 mL  30 mL Oral Q6H PRN Analeise Mccleery, MD      . DISCONTD: bupivacaine-EPINEPHrine (MARCAINE W/ EPI) 0.25 % (with pres) injection    PRN Shelly Rubenstein, MD   20 mL at 03/21/12 1136  . DISCONTD: heparin injection 5,000 Units  5,000 Units Subcutaneous On Call to OR Conley Canal, MD      . DISCONTD: HYDROcodone-acetaminophen (NORCO) 5-325 MG per tablet 1-2 tablet  1-2 tablet Oral Q4H PRN Conley Canal, MD   2  tablet at 03/20/12 2100  . DISCONTD: HYDROmorphone (DILAUDID) injection 0.25-0.5 mg  0.25-0.5 mg Intravenous Q5 min PRN Bedelia Person, MD   0.5 mg at 03/21/12 1300  . DISCONTD: HYDROmorphone PCA 0.3 mg/mL (DILAUDID) 0.3 mg/mL infusion           . DISCONTD: morphine 4 MG/ML injection 4 mg  4 mg Intravenous Q4H PRN Jacqui Headen, MD   4 mg at 03/21/12 0156  . DISCONTD: ondansetron (ZOFRAN) injection 4 mg  4 mg Intravenous Q6H PRN Shelly Rubenstein, MD      . DISCONTD: ondansetron (ZOFRAN) tablet 4 mg  4 mg Oral Q6H PRN Oaklynn Stierwalt, MD      . DISCONTD: ondansetron (ZOFRAN) tablet 4 mg  4 mg Oral Q6H PRN Shelly Rubenstein, MD      . DISCONTD: pantoprazole (PROTONIX) EC tablet 40 mg  40 mg Oral Q1200 Tommy Goostree, MD   40 mg at 03/20/12 1129   Facility-Administered Medications Ordered in Other Encounters  Medication Dose Route Frequency Provider Last Rate Last Dose  . DISCONTD: 0.9 %  sodium chloride infusion    Continuous PRN Adria Dill, CRNA      . DISCONTD: acetaminophen (OFIRMEV) IV    PRN Adria Dill, CRNA   1,000 mg at 03/21/12 1042  . DISCONTD: fentaNYL (SUBLIMAZE) injection    PRN Adria Dill, CRNA   100 mcg at 03/21/12 1039  . DISCONTD: glycopyrrolate (ROBINUL) injection    PRN Adria Dill, CRNA   0.6 mg at 03/21/12 1137  . DISCONTD: lactated ringers infusion    Continuous PRN Adria Dill, CRNA      . DISCONTD: lidocaine (XYLOCAINE) 1 % (with pres) injection    PRN Adria Dill, CRNA   100 mg at 03/21/12 1010  . DISCONTD: neostigmine (PROSTIGMINE) injection   Intravenous PRN Adria Dill, CRNA   5 mg at 03/21/12 1137  . DISCONTD: ondansetron (ZOFRAN) injection    PRN Adria Dill, CRNA   4 mg at 03/21/12 1137  . DISCONTD: propofol (DIPRIVAN) 10 mg/ml infusion    PRN Adria Dill, CRNA   150 mg at 03/21/12 1010  .  DISCONTD: rocuronium (ZEMURON) injection    PRN Adria Dill, CRNA   50 mg at 03/21/12 1015  . DISCONTD: succinylcholine (ANECTINE) injection    PRN Adria Dill, CRNA   100 mg at 03/21/12 1010  . DISCONTD: vecuronium (NORCURON) injection    PRN Adria Dill, CRNA   4 mg at 03/21/12 1052   Allergies  Allergen Reactions  . Chicken Allergy     unknown   Active Problems:  Adenocarcinoma Of Cecum  Abdominal pain, acute  HCAP (healthcare-associated pneumonia)  GERD (gastroesophageal reflux disease)   Vital signs in last 24 hours: Temp:  [98 F (36.7 C)-99.3 F (37.4 C)] 98 F (36.7 C) (07/08 1200) Pulse Rate:  [70-95] 78  (07/08 1430) Resp:  [16-26] 19  (07/08 1430) BP: (105-162)/(63-86) 140/80 mmHg (07/08 1430) SpO2:  [92 %-100 %] 100 % (07/08 1430) Weight change:  Last BM Date: 03/21/12  Intake/Output from previous day: 07/07 0701 - 07/08 0700 In: 1040 [P.O.:240; IV Piggyback:800] Out: -  Intake/Output this shift: Total I/O In: 2747.5 [I.V.:2350; Blood:397.5] Out: 575 [Urine:475; Blood:100]  Lab Results:  [facility] 03/21/12 0450 03/20/12 0448  WBC 5.5 5.3  HGB 11.4* 11.5*  HCT 33.3* 33.1*  PLT 85* 83*   BMET  Basename 03/21/12 0450 03/20/12 0448  NA 140 139  K 3.4* 3.8  CL 105 103  CO2 26 28  GLUCOSE 97 103*  BUN 9 11  CREATININE 1.06 1.17  CALCIUM 8.4 8.2*    Studies/Results: [name] Wo Contrast  03/20/2012  *RADIOLOGY REPORT*  Clinical Data: Colon cancer.  Worsening confusion.  Assess for metastatic disease.  MRI HEAD WITHOUT AND WITH CONTRAST  Technique:  Multiplanar, multiecho pulse sequences of the brain and surrounding structures were obtained according to standard protocol without and with intravenous contrast  Contrast: 20mL MULTIHANCE GADOBENATE DIMEGLUMINE 529 MG/ML IV SOLN  Comparison: 11/26/2009  Findings: Diffusion imaging does not show any acute or subacute infarction.  There are minimal small vessel changes in the pons. No cerebellar abnormality.  The cerebral hemispheres show chronic small vessel changes in the deep and subcortical white matter.  No cortical or large vessel territory  infarction.  No primary or metastatic mass lesion, hemorrhage, hydrocephalus or extra-axial collection.  After contrast administration, no abnormal enhancement occurs.  No pituitary mass.  No skull or skull base lesion.  IMPRESSION: No acute finding.  No metastatic disease or acute infarction.  Chronic small vessel changes of the hemispheric white matter, fairly typical for age.  Original Report Authenticated By: Thomasenia Sales, M.D.   Dg Chest Port 1 View  03/20/2012  *RADIOLOGY REPORT*  Clinical Data: Pneumonia  PORTABLE CHEST - 1 VIEW  Comparison: 03/19/2012; chest CT - 03/09/2012  Findings: Grossly unchanged cardiac silhouette and mediastinal contours with mild elevation of the right hemidiaphragm.  Interval increase in right upper lung ill-defined heterogeneous possible airspace opacities.  Bibasilar heterogeneous opacities are grossly unchanged.  No definite pleural effusion or pneumothorax. Unchanged bones.  IMPRESSION: Interval increase in ill-defined heterogeneous opacities within the right upper lung, worrisome for progression of infection. Continued attention on follow-up is recommended.  Original Report Authenticated By: Waynard Reeds, M.D.    Medications: I have reviewed the patient's current medications.   Physical exam GENERAL- drowsy. HEAD- normal atraumatic, no neck masses, normal thyroid, no jvd RESPIRATORY- appears well, vitals normal, no respiratory distress, acyanotic, normal RR, ear and throat exam is normal, neck free of mass or lymphadenopathy, chest  clear, no wheezing, crepitations, rhonchi, normal symmetric air entry CVS- regular rate and rhythm, S1, S2 normal, no murmur, click, rub or gallop ABDOMEN- fresh surgical scar. No bleed. NEURO- Grossly normal EXTREMITIES- extremities normal, atraumatic, no cyanosis or edema  Plan   .Adenocarcinoma Of Cecum/Abdominal pain, acute- s/p colectomy.  Appreciate surgical assistance. Continue mx per Dr Magnus Ivan. Will ask oncology to  see in am. .HCAP (healthcare-associated pneumonia)-D3 abx to complete 7 days. Patient remains comfortable respiratory wise. Follow Sputum culture. Marland KitchenGERD (gastroesophageal reflux disease)- continue ppi.  .Dementia- supportive care.  .dvt prophylaxis- scds.     Jeanne Terrance 03/21/2012 4:16 PM Pager: [phone].

## 2012-03-21 NOTE — H&P (View-Only) (Signed)
Subjective: Patient is awake and alert, but a poor historian. Step son and daughter in law here.  Abdominal pain may be slightly worse. No nausea or vomiting.  Temp to 102 yesterday. Afebrile now. Heart rate 65. On vancomycin and Zosyn for pneumonia.  MRI brain this morning shows no metastasis. Microvascular disease noted. Discussed with Dr. Karin Golden and agreed.  Hemoglobin 11.5. White blood cell count 5300. Albumin 2.4. CEA 0.8 on June 25.  Objective: Vital signs in last 24 hours: Temp:  [97.7 F (36.5 C)-102.1 F (38.9 C)] 97.7 F (36.5 C) (07/07 0504) Pulse Rate:  [65-89] 65  (07/07 0504) Resp:  [16-22] 18  (07/07 0504) BP: (98-137)/(59-80) 109/64 mmHg (07/07 0504) SpO2:  [90 %-99 %] 95 % (07/07 0504) Weight:  [222 lb (100.699 kg)] 222 lb (100.699 kg) (07/06 1900) Last BM Date: 03/19/12  Intake/Output from previous day: 07/06 0701 - 07/07 0700 In: 1000 [I.V.:1000] Out: 100 [Urine:100] Intake/Output this shift:    General appearance: awake. Appears elderly and somewhat deconditioned. Answers most questions appropriately but gets confused. Does not appear to be in severe distress, but does complain of pain. Resp: decreased breath sounds on the right side compared to the left. No rhonchi or wheeze. SaO2 95% GI: tender right lower quadrant  more than left lower quadrant. Some guarding. No mass. Soft upper abdomen. Not distended. Well-healed scars from previous cholecystectomy.  Lab Results:  Results for orders placed during the hospital encounter of 03/19/12 (from the past 24 hour(s))  CBC WITH DIFFERENTIAL     Status: Abnormal   Collection Time   03/19/12 12:24 PM      Component Value Range   WBC 5.9  4.0 - 10.5 K/uL   RBC 4.16 (*) 4.22 - 5.81 MIL/uL   Hemoglobin 13.8  13.0 - 17.0 g/dL   HCT 16.1  09.6 - 04.5 %   MCV 95.9  78.0 - 100.0 fL   MCH 33.2  26.0 - 34.0 pg   MCHC 34.6  30.0 - 36.0 g/dL   RDW 40.9  81.1 - 91.4 %   Platelets 115 (*) 150 - 400 K/uL   Neutrophils Relative 71  43 - 77 %   Lymphocytes Relative 18  12 - 46 %   Monocytes Relative 10  3 - 12 %   Eosinophils Relative 1  0 - 5 %   Basophils Relative 0  0 - 1 %   Neutro Abs 4.1  1.7 - 7.7 K/uL   Lymphs Abs 1.1  0.7 - 4.0 K/uL   Monocytes Absolute 0.6  0.1 - 1.0 K/uL   Eosinophils Absolute 0.1  0.0 - 0.7 K/uL   Basophils Absolute 0.0  0.0 - 0.1 K/uL   Smear Review LARGE PLATELETS PRESENT    BASIC METABOLIC PANEL     Status: Abnormal   Collection Time   03/19/12 12:24 PM      Component Value Range   Sodium 142  135 - 145 mEq/L   Potassium 3.7  3.5 - 5.1 mEq/L   Chloride 105  96 - 112 mEq/L   CO2 25  19 - 32 mEq/L   Glucose, Bld 129 (*) 70 - 99 mg/dL   BUN 14  6 - 23 mg/dL   Creatinine, Ser 7.82  0.50 - 1.35 mg/dL   Calcium 9.1  8.4 - 95.6 mg/dL   GFR calc non Af Amer 70 (*) >90 mL/min   GFR calc Af Amer 81 (*) >90 mL/min  LIPASE, BLOOD  Status: Normal   Collection Time   03/19/12  1:00 PM      Component Value Range   Lipase 36  11 - 59 U/L  LACTIC ACID, PLASMA     Status: Normal   Collection Time   03/19/12  1:01 PM      Component Value Range   Lactic Acid, Venous 1.2  0.5 - 2.2 mmol/L  URINALYSIS, ROUTINE W REFLEX MICROSCOPIC     Status: Abnormal   Collection Time   03/19/12  6:53 PM      Component Value Range   Color, Urine YELLOW  YELLOW   APPearance CLEAR  CLEAR   Specific Gravity, Urine >1.046 (*) 1.005 - 1.030   pH 5.0  5.0 - 8.0   Glucose, UA NEGATIVE  NEGATIVE mg/dL   Hgb urine dipstick NEGATIVE  NEGATIVE   Bilirubin Urine NEGATIVE  NEGATIVE   Ketones, ur NEGATIVE  NEGATIVE mg/dL   Protein, ur NEGATIVE  NEGATIVE mg/dL   Urobilinogen, UA 0.2  0.0 - 1.0 mg/dL   Nitrite NEGATIVE  NEGATIVE   Leukocytes, UA NEGATIVE  NEGATIVE  COMPREHENSIVE METABOLIC PANEL     Status: Abnormal   Collection Time   03/20/12  4:48 AM      Component Value Range   Sodium 139  135 - 145 mEq/L   Potassium 3.8  3.5 - 5.1 mEq/L   Chloride 103  96 - 112 mEq/L   CO2 28  19 - 32  mEq/L   Glucose, Bld 103 (*) 70 - 99 mg/dL   BUN 11  6 - 23 mg/dL   Creatinine, Ser 1.61  0.50 - 1.35 mg/dL   Calcium 8.2 (*) 8.4 - 10.5 mg/dL   Total Protein 6.1  6.0 - 8.3 g/dL   Albumin 2.4 (*) 3.5 - 5.2 g/dL   AST 48 (*) 0 - 37 U/L   ALT 50  0 - 53 U/L   Alkaline Phosphatase 102  39 - 117 U/L   Total Bilirubin 1.1  0.3 - 1.2 mg/dL   GFR calc non Af Amer 58 (*) >90 mL/min   GFR calc Af Amer 68 (*) >90 mL/min  PROTIME-INR     Status: Abnormal   Collection Time   03/20/12  4:48 AM      Component Value Range   Prothrombin Time 16.5 (*) 11.6 - 15.2 seconds   INR 1.31  0.00 - 1.49  CBC     Status: Abnormal   Collection Time   03/20/12  4:48 AM      Component Value Range   WBC 5.3  4.0 - 10.5 K/uL   RBC 3.40 (*) 4.22 - 5.81 MIL/uL   Hemoglobin 11.5 (*) 13.0 - 17.0 g/dL   HCT 09.6 (*) 04.5 - 40.9 %   MCV 97.4  78.0 - 100.0 fL   MCH 33.8  26.0 - 34.0 pg   MCHC 34.7  30.0 - 36.0 g/dL   RDW 81.1  91.4 - 78.2 %   Platelets 83 (*) 150 - 400 K/uL  APTT     Status: Normal   Collection Time   03/20/12  4:48 AM      Component Value Range   aPTT 31  24 - 37 seconds     Studies/Results: @RISRSLT24 @     . sodium chloride   Intravenous STAT  . antiseptic oral rinse  15 mL Mouth Rinse q12n4p  . chlorhexidine  15 mL Mouth Rinse BID  . diphenhydrAMINE  25  mg Oral Once  . HYDROmorphone  1 mg Intravenous Once  . HYDROmorphone  1 mg Intravenous Once  . levofloxacin (LEVAQUIN) IV  750 mg Intravenous Once  . levofloxacin (LEVAQUIN) IV  750 mg Intravenous Once  . ondansetron (ZOFRAN) IV  4 mg Intravenous Once  . pantoprazole  40 mg Oral Q1200  . piperacillin-tazobactam (ZOSYN)  IV  3.375 g Intravenous Q8H  . sodium chloride  1,000 mL Intravenous Once  . vancomycin  1,250 mg Intravenous Q12H  . zolpidem  5 mg Oral Once     Assessment/Plan:   Acute abdominal pain, probably associated cecal cancer. Physical exam suggests peritoneal irritation, but no evidence of perforation or abscess  or obstruction on CT. Will need right colectomy and we'll tentatively plan for this to be done by Dr. Abigail Miyamoto tomorrow. This may be possible laparoscopically, but he is high risk for converting to an open operation.  I do not think that he would tolerate a bowel prep with the type of pain that he is having, but this will probably be reasonable to do without prep  Right-sided pneumonia. On Zosyn and vancomycin. This should also be adequate coverage for abdominal surgery. We'll get a chest x-ray today.  Dementia. Suspect this will deteriorate transiently in the postop period. No mets on brain MRI today.  Status post laparoscopic cholecystectomy.  History sleep apnea. Will need to be in a more monitor setting postop.  I have discussed all these issues with the son-in-law and daughter-in-law and patient. I have discussed the indications and details of surgery with the patient and his family. Indications, details, techniques, and numerous risks of surgery were outlined. All their questions were answered. They seem to understand these issues. They agree with this plan.    LOS: 1 day    Zigmond Trela M. Derrell Lolling, M.D., [facility] General and Minimally invasive Surgery Breast and Colorectal Surgery Office:   [phone] Pager:   [phone]  03/20/2012  . .prob

## 2012-03-21 NOTE — Anesthesia Procedure Notes (Signed)
Procedure Name: Intubation Date/Time: 03/21/2012 10:11 AM Performed by: Leona Singleton A Pre-anesthesia Checklist: Emergency Drugs available Patient Re-evaluated:Patient Re-evaluated prior to inductionOxygen Delivery Method: Circle system utilized Preoxygenation: Pre-oxygenation with 100% oxygen Intubation Type: IV induction, Rapid sequence and Cricoid Pressure applied Laryngoscope Size: Miller and 2 Grade View: Grade I Tube size: 8.0 mm Number of attempts: 1 Airway Equipment and Method: Stylet Placement Confirmation: ETT inserted through vocal cords under direct vision,  positive ETCO2 and breath sounds checked- equal and bilateral Secured at: 21 cm Tube secured with: Tape Dental Injury: Teeth and Oropharynx as per pre-operative assessment

## 2012-03-21 NOTE — Preoperative (Signed)
Beta Blockers   Reason not to administer Beta Blockers:Not Applicable 

## 2012-03-22 ENCOUNTER — Encounter (HOSPITAL_COMMUNITY): Payer: Self-pay | Admitting: Surgery

## 2012-03-22 LAB — BASIC METABOLIC PANEL
BUN: 6 mg/dL (ref 6–23)
Creatinine, Ser: 0.98 mg/dL (ref 0.50–1.35)
GFR calc non Af Amer: 77 mL/min — ABNORMAL LOW (ref >90)
Glucose, Bld: 103 mg/dL — ABNORMAL HIGH (ref 70–99)
Potassium: 3.7 mEq/L (ref 3.5–5.1)

## 2012-03-22 LAB — CBC
HCT: 31.8 % — ABNORMAL LOW (ref 39.0–52.0)
Hemoglobin: 10.8 g/dL — ABNORMAL LOW (ref 13.0–17.0)
MCH: 32.9 pg (ref 26.0–34.0)
MCHC: 34 g/dL (ref 30.0–36.0)

## 2012-03-22 LAB — PREPARE PLATELET PHERESIS

## 2012-03-22 MED ORDER — SODIUM CHLORIDE 0.9 % IJ SOLN
INTRAMUSCULAR | Status: AC
  Administered 2012-03-22: 10 mL
  Filled 2012-03-22: qty 10

## 2012-03-22 MED ORDER — LORAZEPAM 2 MG/ML IJ SOLN
0.5000 mg | Freq: Every evening | INTRAMUSCULAR | Status: DC | PRN
  Administered 2012-03-23: 0.5 mg via INTRAVENOUS
  Filled 2012-03-22: qty 1

## 2012-03-22 NOTE — Progress Notes (Signed)
John Summers is a 78 y.o. 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 He came in with worsening right lower quadrant pain, and was found to have focal perforation of the cecum, s/p right colectomy on 03/21/12 by Dr Magnus Ivan. He also has PNA(HCAP vs Aspiration D4 Abx). I have consulted oncology, Dr Donnie Coffin will see in consult. He seems doing well but occasionally hallucinates, and complaints of pain post op.  1. Abdominal pain   2. Cecal cancer   3. CAP (community acquired pneumonia)     Past Medical History  Diagnosis Date  . GERD (gastroesophageal reflux disease)   . Pollen allergies   . Dementia   . Sleep apnea   . Unspecified essential hypertension   . Thrombocytopenia     Dr. Orlie Dakin follows  . Skin cancer     ears  . History of colon polyps   . History of gallstones   . Status post dilation of esophageal narrowing   . Colon adenocarcinoma   . Kidney stones    Current Facility-Administered Medications  Medication Dose Route Frequency Provider Last Rate Last Dose  . 0.9 % NaCl with KCl 20 mEq/ L  infusion   Intravenous Continuous Shelly Rubenstein, MD 125 mL/hr at 03/22/12 0143    . acetaminophen (OFIRMEV) IV 1,000 mg  1,000 mg Intravenous Q6H Shelly Rubenstein, MD   1,000 mg at 03/22/12 0036  . antiseptic oral rinse (BIOTENE) solution 15 mL  15 mL Mouth Rinse q12n4p Seibert Keeter, MD   15 mL at 03/21/12 1752  . chlorhexidine (PERIDEX) 0.12 % solution 15 mL  15 mL Mouth Rinse BID Lillyan Hitson, MD   15 mL at 03/21/12 2017  . diphenhydrAMINE (BENADRYL) injection 12.5 mg  12.5 mg Intravenous Q6H PRN Shelly Rubenstein, MD       Or  . diphenhydrAMINE (BENADRYL) 12.5 MG/5ML elixir 12.5 mg  12.5 mg Oral Q6H PRN Shelly Rubenstein, MD      . fentaNYL (SUBLIMAZE) injection 50-100 mcg  50-100 mcg Intravenous PRN Bedelia Person, MD      . HYDROmorphone (DILAUDID) 1 MG/ML injection           . HYDROmorphone (DILAUDID) 1 MG/ML injection           . HYDROmorphone (DILAUDID) injection 1  mg  1 mg Intravenous Q1H PRN Shelly Rubenstein, MD      . HYDROmorphone (DILAUDID) PCA injection 0.3 mg/mL   Intravenous Q4H Shelly Rubenstein, MD   0.2 mg at 03/22/12 0400  . LORazepam (ATIVAN) 2 MG/ML injection           . LORazepam (ATIVAN) injection 1 mg  1 mg Intravenous Once PRN Bedelia Person, MD   1 mg at 03/21/12 1308  . midazolam (VERSED) injection 1-2 mg  1-2 mg Intravenous PRN Bedelia Person, MD      . naloxone [facility]) injection 0.4 mg  0.4 mg Intravenous PRN Shelly Rubenstein, MD       And  . sodium chloride 0.9 % injection 9 mL  9 mL Intravenous PRN Shelly Rubenstein, MD      . ondansetron [facility]) injection 4 mg  4 mg Intravenous Q6H PRN Dave Mergen, MD      . ondansetron (ZOFRAN) injection 4 mg  4 mg Intravenous Q6H PRN Shelly Rubenstein, MD      . pantoprazole (PROTONIX) injection 40 mg  40 mg Intravenous Q24H Shelly Rubenstein, MD   40 mg at 03/21/12  2257  . piperacillin-tazobactam (ZOSYN) IVPB 3.375 g  3.375 g Intravenous Q8H Tirza Senteno, MD   3.375 g at 03/22/12 0644  . sodium chloride 0.9 % injection           . vancomycin (VANCOCIN) 1,250 mg in sodium chloride 0.9 % 250 mL IVPB  1,250 mg Intravenous Q12H Welton Bord, MD   1,250 mg at 03/22/12 0808  . DISCONTD: 0.9 % irrigation (POUR BTL)    PRN Shelly Rubenstein, MD   2,000 mL at 03/21/12 1127  . DISCONTD: acetaminophen (TYLENOL) suppository 650 mg  650 mg Rectal Q6H PRN Caydin Yeatts, MD      . DISCONTD: acetaminophen (TYLENOL) tablet 650 mg  650 mg Oral Q6H PRN Dustin Burrill, MD   650 mg at 03/19/12 1906  . DISCONTD: alum & mag hydroxide-simeth (MAALOX/MYLANTA) 200-200-20 MG/5ML suspension 30 mL  30 mL Oral Q6H PRN Brezlyn Manrique, MD      . DISCONTD: bupivacaine-EPINEPHrine (MARCAINE W/ EPI) 0.25 % (with pres) injection    PRN Shelly Rubenstein, MD   20 mL at 03/21/12 1136  . DISCONTD: HYDROcodone-acetaminophen (NORCO) 5-325 MG per tablet 1-2 tablet  1-2 tablet Oral Q4H PRN Conley Canal, MD   2 tablet at 03/20/12 2100    . DISCONTD: HYDROmorphone (DILAUDID) injection 0.25-0.5 mg  0.25-0.5 mg Intravenous Q5 min PRN Bedelia Person, MD   0.5 mg at 03/21/12 1300  . DISCONTD: HYDROmorphone PCA 0.3 mg/mL (DILAUDID) 0.3 mg/mL infusion           . DISCONTD: morphine 4 MG/ML injection 4 mg  4 mg Intravenous Q4H PRN Anastazja Isaac, MD   4 mg at 03/21/12 0156  . DISCONTD: ondansetron (ZOFRAN) injection 4 mg  4 mg Intravenous Q6H PRN Shelly Rubenstein, MD      . DISCONTD: ondansetron (ZOFRAN) tablet 4 mg  4 mg Oral Q6H PRN Kadar Chance, MD      . DISCONTD: ondansetron (ZOFRAN) tablet 4 mg  4 mg Oral Q6H PRN Shelly Rubenstein, MD      . DISCONTD: pantoprazole (PROTONIX) EC tablet 40 mg  40 mg Oral Q1200 Lazette Estala, MD   40 mg at 03/20/12 1129   Facility-Administered Medications Ordered in Other Encounters  Medication Dose Route Frequency Provider Last Rate Last Dose  . DISCONTD: 0.9 %  sodium chloride infusion    Continuous PRN Adria Dill, CRNA      . DISCONTD: acetaminophen (OFIRMEV) IV    PRN Adria Dill, CRNA   1,000 mg at 03/21/12 1042  . DISCONTD: fentaNYL (SUBLIMAZE) injection    PRN Adria Dill, CRNA   100 mcg at 03/21/12 1039  . DISCONTD: glycopyrrolate (ROBINUL) injection    PRN Adria Dill, CRNA   0.6 mg at 03/21/12 1137  . DISCONTD: lactated ringers infusion    Continuous PRN Adria Dill, CRNA      . DISCONTD: lidocaine (XYLOCAINE) 1 % (with pres) injection    PRN Adria Dill, CRNA   100 mg at 03/21/12 1010  . DISCONTD: neostigmine (PROSTIGMINE) injection   Intravenous PRN Adria Dill, CRNA   5 mg at 03/21/12 1137  . DISCONTD: ondansetron (ZOFRAN) injection    PRN Adria Dill, CRNA   4 mg at 03/21/12 1137  . DISCONTD: propofol (DIPRIVAN) 10 mg/ml infusion    PRN Adria Dill, CRNA   150 mg at 03/21/12 1010  . DISCONTD: rocuronium (ZEMURON) injection    PRN Tresa Endo  A [place], CRNA   50 mg at 03/21/12 1015  . DISCONTD: succinylcholine (ANECTINE) injection    PRN Adria Dill, CRNA   100 mg  at 03/21/12 1010  . DISCONTD: vecuronium (NORCURON) injection    PRN Adria Dill, CRNA   4 mg at 03/21/12 1052   Allergies  Allergen Reactions  . Chicken Allergy     unknown   Active Problems:  Adenocarcinoma Of Cecum  Abdominal pain, acute  HCAP (healthcare-associated pneumonia)  GERD (gastroesophageal reflux disease)   Vital signs in last 24 hours: Temp:  [97.9 F (36.6 C)-99 F (37.2 C)] 99 F (37.2 C) (07/09 0857) Pulse Rate:  [74-92] 79  (07/09 0421) Resp:  [5-26] 17  (07/09 0421) BP: (128-162)/(68-86) 129/75 mmHg (07/09 0421) SpO2:  [92 %-100 %] 97 % (07/09 0421) Weight change:  Last BM Date: 03/21/12  Intake/Output from previous day: 07/08 0701 - 07/09 0700 In: 5375.5 [I.V.:4152.1; Blood:397.5; IV Piggyback:825.9] Out: 1050 [Urine:950; Blood:100] Intake/Output this shift:    Lab Results:  Basename 03/22/12 0525 03/21/12 0450  WBC 5.2 5.5  HGB 10.8* 11.4*  HCT 31.8* 33.3*  PLT 100* 85*   BMET  Basename 03/22/12 0525 03/21/12 0450  NA 140 140  K 3.7 3.4*  CL 107 105  CO2 22 26  GLUCOSE 103* 97  BUN 6 9  CREATININE 0.98 1.06  CALCIUM 8.0* 8.4    Studies/Results: [name] Wo Contrast  03/20/2012  *RADIOLOGY REPORT*  Clinical Data: Colon cancer.  Worsening confusion.  Assess for metastatic disease.  MRI HEAD WITHOUT AND WITH CONTRAST  Technique:  Multiplanar, multiecho pulse sequences of the brain and surrounding structures were obtained according to standard protocol without and with intravenous contrast  Contrast: 20mL MULTIHANCE GADOBENATE DIMEGLUMINE 529 MG/ML IV SOLN  Comparison: 11/26/2009  Findings: Diffusion imaging does not show any acute or subacute infarction.  There are minimal small vessel changes in the pons. No cerebellar abnormality.  The cerebral hemispheres show chronic small vessel changes in the deep and subcortical white matter.  No cortical or large vessel territory infarction.  No primary or metastatic mass lesion, hemorrhage,  hydrocephalus or extra-axial collection.  After contrast administration, no abnormal enhancement occurs.  No pituitary mass.  No skull or skull base lesion.  IMPRESSION: No acute finding.  No metastatic disease or acute infarction.  Chronic small vessel changes of the hemispheric white matter, fairly typical for age.  Original Report Authenticated By: Thomasenia Sales, M.D.   Dg Chest Port 1 View  03/20/2012  *RADIOLOGY REPORT*  Clinical Data: Pneumonia  PORTABLE CHEST - 1 VIEW  Comparison: 03/19/2012; chest CT - 03/09/2012  Findings: Grossly unchanged cardiac silhouette and mediastinal contours with mild elevation of the right hemidiaphragm.  Interval increase in right upper lung ill-defined heterogeneous possible airspace opacities.  Bibasilar heterogeneous opacities are grossly unchanged.  No definite pleural effusion or pneumothorax. Unchanged bones.  IMPRESSION: Interval increase in ill-defined heterogeneous opacities within the right upper lung, worrisome for progression of infection. Continued attention on follow-up is recommended.  Original Report Authenticated By: Waynard Reeds, M.D.    Medications: I have reviewed the patient's current medications.   Physical exam GENERAL- somnolent. HEAD- normal atraumatic, no neck masses, normal thyroid, no jvd RESPIRATORY- appears well, vitals normal, no respiratory distress, acyanotic, normal RR, ear and throat exam is normal, neck free of mass or lymphadenopathy, chest clear, no wheezing, crepitations, rhonchi, normal symmetric air entry CVS- regular rate and rhythm, S1,  S2 normal, no murmur, click, rub or gallop ABDOMEN-no bleed. Faint bowel sounds. NEURO- Grossly normal EXTREMITIES- extremities normal, atraumatic, no cyanosis or edema  Plan   .Adenocarcinoma Of Cecum/Abdominal pain, acute- s/p colectomy POD#1. Appreciate surgical assistance. Continue mx per Dr Magnus Ivan. Dr Donnie Coffin to see later today. Marland KitchenHCAP (healthcare-associated pneumonia)-D4 abx  to complete 7 days. Patient remains comfortable respiratory wise.  Marland KitchenGERD (gastroesophageal reflux disease)- continue ppi.  .Dementia- supportive care.  .dvt prophylaxis- scds, mobilize per surgery. I have discussed plan of care with Mrs Zalewski at bedside.    Robina Hamor 03/22/2012 9:16 AM Pager: [phone].

## 2012-03-22 NOTE — Progress Notes (Signed)
Utilization review completed.  

## 2012-03-22 NOTE — Consult Note (Signed)
[facility] CONSULTATION NOTE  Primary MD: Dr. Patsy Lager [facility])  Reason for Consult:  ZOX:[id number] Judie Petit Cozzens is a 78 y.o. male Who was in his usual state of health until June 2013, when he developed suspected C diff colitis requiring hospitalization for evaluation and treatment of symptoms. Workup included CT abdomen and pelvis on 02/20/2012 with contrast,revealed ring- like enhancement of the mucosa in the cecum with concentric appearance, which required subsequent colonoscopy by Dr. Christella Hartigan on 03/08/2012 -after initially declining procedure. The partially circumferntial 3-4cm mass in cecum was visualized. The mass did not involve IC valve or appendiceal orifice. Biopsies taken.Two sessile polyps were removed, one was 1cm on the ascending colon and the other of 6mm, located in descending segment. Path report of the cecal mass, was positive for adenocarcinoma. Polyps were negative for malignancy ( [id number]). He was to be seen by Surgery but due to constant, progressive RLQ pain, complicated with progression of R sided pneumonia per CXR,he was admitted to [facility] for treatment. A new CT abdomen and pelvis with contrast on 03/19/2012 showed local progression of the disease. On 03/21/2012, patient underwent Lap assisted R partial colectomy by Dr. Magnus Ivan. Formal results pending at this time. CEA on 6/25 was 0.8. MRI brain on 7/7 negative for mets CT chest with contrast on 6/26 showed no definitive evidence of metastatic disease in the chest . We were requested to see Mr. Wilsey while in the hospital with recommendations from the oncological standpoint. Of note, patient has a history of ITP followed at the [facility] by Dr. Orlie Dakin, due to follow up with him in 4 weeks.    PMH:  Past Medical History   Diagnosis  Date   .  GERD (gastroesophageal reflux disease)    .  Pollen allergies    .   Alzheimer's Dementia  ([facility])   .  Sleep apnea on CPAP   .  Unspecified  essential hypertension    .  Thrombocytopenia      Dr. Orlie Dakin follows   .  Skin cancer      ears   .  History of colon polyps    .  History of gallstones    .  Status post dilation of esophageal narrowing    .  Colon adenocarcinoma    .  Kidney stones      Surgeries:    Past Surgical History   Procedure  Date   .  Cholecystectomy  2011   .  Tonsillectomy and adenoidectomy  1946   .  Total hip arthroplasty      left   .  Lithotripsy  2008   .  Pilonidal cyst excision    .  Upper gastrointestinal endoscopy     Allergies:  Allergies   Allergen  Reactions   .  Chicken Allergy      unknown    Medications:  Prior to Admission:  Prescriptions prior to admission   Medication  Sig  Dispense  Refill   .  donepezil (ARICEPT) 10 MG tablet  Take 10 mg by mouth at bedtime.     Marland Kitchen  omeprazole (PRILOSEC) 20 MG capsule  Take 20 mg by mouth daily.     Marland Kitchen  triamterene-hydrochlorothiazide (MAXZIDE-25) 37.5-25 MG per tablet  Take 0.5 each (0.5 tablets total) by mouth daily.  45 tablet  3    GNF:AOZHYQMVHQIONGE, diphenhydrAMINE, fentaNYL, HYDROmorphone (DILAUDID) injection, LORazepam, midazolam, naloxone, ondansetron (ZOFRAN) IV, ondansetron (ZOFRAN) IV, sodium chloride, DISCONTD:  0.9 % irrigation (POUR BTL), DISCONTD: acetaminophen, DISCONTD: acetaminophen, DISCONTD: alum & mag hydroxide-simeth, DISCONTD: bupivacaine-EPINEPHrine, DISCONTD: HYDROcodone-acetaminophen, DISCONTD: HYDROmorphone (DILAUDID) injection  DISCONTD: morphine injection, DISCONTD: ondansetron (ZOFRAN) IV, DISCONTD: ondansetron, DISCONTD: ondansetron    ROS:as per wife's report due to the patient's confusion  Constitutional: Negative for weight loss. Negative for fever, chills . Increasingly inactive over the last year.  Eyes: Negative for blurred vision and double vision.  Respiratory: Negative for cough, hemoptysis and chronic shortness of breath in the setting of sleep apnea requiring CPAP and current diagnosis of PNA.  Improved symptoms with Vanco/Zosyn  Cardiovascular: Negative for chest pain.  GI: No nausea, vomiting, Or diarrhea at this time, but these symptoms were initially present in early June 2013 as mentioned in HPI, with suspected diagnosis of C Dificile. . Currently he has constipation. No change in bowel caliber prior to admission, or blood in the stools. No Melena or hematochezia. RLQ pain as mentioned in HPI, as well as incisional pain. Bloating for about 5 months GU: No blood in urine. No loss of urinary control.  Skin: Negative for itching. No rash. No petechia. No bruising.  Neurological: No headaches. No motor or sensory deficits. Alzheimer's dementia. Unable to engage in conversation.   Family History:  Family History   Problem  Relation  Age of Onset   .  Colon cancer  Mother    .  Heart disease  Mother    .  Hypertension  Mother    .  Alcohol abuse  Father    .  Esophageal cancer  Neg Hx    .  Rectal cancer  Neg Hx    .  Stomach cancer  Neg Hx     Social History: reports that he has quit smoking. His smoking use included Pipe. He has never used smokeless tobacco. He reports that he drinks alcohol. He reports that he does not use illicit drugs. Patient is married, 2 sons in good health.One of his children is a Engineer, civil (consulting) at the ED at [employer].  Retired Art gallery manager for [employer] .  Physical Exam  78 year old wm in no acute distress awake, confused, unable to carry a conversation, patient states they"are cats in the room, need to move the furniture"  General well-developed and well-nourished  HEENT: Normocephalic, atraumatic, PERRLA. Oral cavity without thrush or lesions.  Neck supple. no thyromegaly, no cervical or supraclavicular adenopathy  Lungs Decreased R lung sounds, no axillary masses.  Breasts: not examined.  Cardiac regular rate and rhythm normal S1-S2, no murmur , rubs or gallops  Abdomen incisional tenderness, staples, bandage in place., decreasedbowel sounds x4. No HSM    GU/rectal: deferred. Foley with clear urine  Extremities no clubbing cyanosis or edema. No bruising or petechial rash  Neuro: poor STM, at this time unable to engage in coherent conversation. Strength and sensation appear intact.   Labs:  CBC   Lab  03/22/12 0525  03/21/12 0450  03/20/12 0448  03/19/12 1224   WBC  5.2  5.5  5.3  5.9   HGB  10.8*  11.4*  11.5*  13.8   HCT  31.8*  33.3*  33.1*  39.9   PLT  100*  85*  83*  115*   MCV  97.0  96.8  97.4  95.9   MCH  32.9  33.1  33.8  33.2   MCHC  34.0  34.2  34.7  34.6   RDW  13.6  13.9  13.8  13.5  LYMPHSABS  --  --  --  1.1   MONOABS  --  --  --  0.6   EOSABS  --  --  --  0.1   BASOSABS  --  --  --  0.0   BANDABS  --  --  --  --    CMP   Lab  03/22/12 0525  03/21/12 0450  03/20/12 0448  03/19/12 1224   NA  140  140  139  142   K  3.7  3.4*  3.8  3.7   CL  107  105  103  105   CO2  22  26  28  25    GLUCOSE  103*  97  103*  129*   BUN  6  9  11  14    CREATININE  0.98  1.06  1.17  1.01   CALCIUM  8.0*  8.4  8.2*  9.1   MG  --  --  --  --   AST  --  --  48*  --   ALT  --  --  50  --   ALKPHOS  --  --  102  --   BILITOT  --  --  1.1  --      Component  Value  Date/Time    BILITOT  1.1  03/20/2012 0448    BILIDIR  0.1  11/05/2011 0929     Lab  03/20/12 0448   INR  1.31   PROTIME  --      Imaging Studies:  Dg Chest Port 1 View  03/20/2012 *RADIOLOGY REPORT* Clinical Data: Pneumonia PORTABLE CHEST - 1 VIEW Comparison: 03/19/2012; chest CT - 03/09/2012 Findings: Grossly unchanged cardiac silhouette and mediastinal contours with mild elevation of the right hemidiaphragm. Interval increase in right upper lung ill-defined heterogeneous possible airspace opacities. Bibasilar heterogeneous opacities are grossly unchanged. No definite pleural effusion or pneumothorax. Unchanged bones. IMPRESSION: Interval increase in ill-defined heterogeneous opacities within the right upper lung, worrisome for progression of infection. Continued  attention on follow-up is recommended. Original Report Authenticated By: Waynard Reeds, M.D.   CT ABDOMEN AND PELVIS WITH CONTRAST 03/21/2012  Comparison: 02/20/2012  Findings: There is pulmonary infiltrate in the right lower lobe consistent with pneumonia. No pleural or pericardial fluid. There is a hiatal hernia. The liver has a normal appearance without focal lesions or biliary ductal dilatation. The left lobe is diminutive. There is been previous cholecystectomy. Common duct is prominent as is typically seen following diameter. The spleen is normal. The pancreas shows a 10 mm cystic lesion along the ventral surface at the junction of the body and head. This could be a small cyst or cystic neoplasm. The adrenal glands are normal. The kidneys are normal. The aorta shows atherosclerotic change but no aneurysm. The IVC crosses to the left of the aorta at the level of the renal veins. Small bowel gas pattern is normal. There is a small ventral hernia containing a small loop of small bowel. No sign of obstruction or inflammation. Cecal mass is again demonstrated, possibly progressive since the previous study. There is inflammation of the fat surrounding the cecum. This could be due to the recent intervention. This could also be indicative of locally aggressive disease with regional spread. The appendix does not appear grossly inflamed. There is no free fluid in the pelvis. Bladder, prostate gland and seminal vesicles appear unremarkable.   IMPRESSION:  There is a patchy infiltrate in the right  lung consistent with pneumonia. The differential diagnosis does include pulmonary infarct due to emboli. Worsening of the appearance in the region of the cecal tip. It appears that the mass is progressive. There is stranding in the regional fat, probably with some small lymph nodes. The findings could represent inflammation following the recent colonoscopy and presumed biopsy. Alternatively, this could indicate aggressive  local progression of disease.  Small bowel loop now extends into the small ventral hernia but there is no evidence of obstruction or incarceration.   MRI HEAD WITHOUT AND WITH CONTRAST 03/20/2012  Comparison: 11/26/2009  Findings: Diffusion imaging does not show any acute or subacute infarction. There are minimal small vessel changes in the pons. No cerebellar abnormality. The cerebral hemispheres show chronic small vessel changes in the deep and subcortical white matter. No cortical or large vessel territory infarction. No primary or  metastatic mass lesion, hemorrhage, hydrocephalus or extra-axial collection. After contrast administration, no abnormal enhancement occurs. No pituitary mass. No skull or skull base lesion.   IMPRESSION:  No acute finding. No metastatic disease or acute infarction. Chronic small vessel changes of the hemispheric white matter, fairly typical for age.   CT CHEST WITH CONTRAST 03/09/2012  Comparison: CT abdomen pelvis 02/20/2012.  Findings: No pathologically enlarged mediastinal, hilar or axillary lymph nodes. Air in the main pulmonary artery is presumably echogenic. Coronary artery calcification. Heart size normal. No pericardial effusion. Small hiatal hernia. There is subpleural airspace consolidation in the posterior segment right upper lobe. Rounded air space consolidation in the right lower lobe is new from 02/20/2012. There are additional areas of peribronchovascular consolidation in the right lower lobe, not previously imaged. Questionable faint 3 mm nodule in the left lower lobe (image 25). No pleural fluid. Airway is unremarkable. Incidental imaging of the upper abdomen shows no 1.5 cm low attenuation lesion in the region of the pancreatic head (image 52). No worrisome lytic or sclerotic lesions.   IMPRESSION: 1. No definitive evidence of metastatic disease in the chest. 2. Subpleural airspace consolidation in the right upper and right lower lobes with additional  peribronchovascular nodularity in the right lower lobe. Findings favor pneumonia. Continued attention on follow-up exams is warranted.   CT ABDOMEN AND PELVIS WITH CONTRAST 02/20/2012  Comparison: None.  Findings: Lung bases are unremarkable. Sagittal images of the spine shows osteopenia and mild degenerative changes lumbar spine. Enhanced liver is unremarkable. The patient is status post cholecystectomy. There is small hiatal hernia. There is small paraesophageal hernia containing fluid measures about 3.9 cm.  Tortuous splenic artery with atherosclerotic calcifications. Spleen, pancreas and adrenal glands are unremarkable. Kidneys are symmetrical in size and enhancement without evidence of focal mass. Mild distended small bowel loops are noted with fluid. Mild ileus cannot be excluded. No air-fluid levels are noted to suggest small  bowel obstruction. There is a small intraluminal lipoma in a small bowel loop in axial image 45 measures 1.2 cm. Again there is no evidence of small bowel obstruction. Small umbilical hernia containing fat without evidence of acute complication. Fluid is noted in the colon. In axial image 59 there is a ring- like enhancement of the mucosa in the cecum with concentric appearance. This is confirmed on the sagittal image 54. Although findings may be due to adherent stool I cannot exclude a cecal mass. Follow-up colonoscopy is recommended. There is no evidence of colitis. No pericolonic inflammation is noted. There are metallic artifacts from left hip prosthesis. Small left hydrocele is noted. No destructive bony lesions are  noted within pelvis. The urinary bladder is unremarkable. Prostate gland measures 4.2 x 4.7 cm. CBD measures 1.1 cm in diameter. Delayed renal images shows bilateral renal symmetrical excretion. Bilateral visualized proximal ureter is unremarkable.   IMPRESSION:  1. Small hiatal hernia is noted. Small paraesophageal hernia containing fluid is noted.  2. Status  post cholecystectomy.  3. Fluid and mild distended small bowel loops without air fluid levels to suggest small bowel obstruction. Mild ileus cannot be excluded. A focal intraluminal small bowel lipoma without evidence of acute complication  3. There is a ring-like high density material or enhancement in the cecum in axial image 59. Although findings may be due to adherent stool a cecal mass cannot be excluded. Follow-up colonoscopy is recommended.  4. No evidence of colitis.  5. There are metallic artifacts from a left hip prosthesis.  6. Small left hydrocele.    A/P: 78 y.o. male With a new diagnosis of Adenocarcinoma of the colon, s/p laparoscopic R Hemicolectomy on 03/21/2012, with path report currently pending. Will await for the results to proceed with recommendations regarding diagnosis or futher workup studies while at the hospital.. As mentioned above, Mr. Isabella Bowens is a patient at the [facility] where he is being followed for ITP. Family is interested in continuing his care at the facility.  Dr. Donnie Coffin is to see the patient following this consult with recommendations regarding diagnosis, treatment options and further workup studies. Thank you for the referral.   [facility]  03/22/2012 9:56 AM

## 2012-03-22 NOTE — Evaluation (Signed)
Physical Therapy Evaluation Patient Details Name: John Summers MRN: [id number] DOB: [birth year] Today's Date: 03/22/2012 Time: 1135-1209 PT Time Calculation (min): 34 min  PT Assessment / Plan / Recommendation Clinical Impression  pt presents with R Hemicolectomy secondary to Adenocarcinoma.  pt with early dementia at baseline, now worse post-op.  Family wishes to take pt home and notes that wife will be present 24/7 and children will be taking turns being there 24/7 as well.      PT Assessment  Patient needs continued PT services    Follow Up Recommendations  Home health PT;Supervision/Assistance - 24 hour    Barriers to Discharge None      Equipment Recommendations  Rolling walker with 5" wheels;3 in 1 bedside comode    Recommendations for Other Services     Frequency Min 3X/week    Precautions / Restrictions Precautions Precautions: Fall Restrictions Weight Bearing Restrictions: No   Pertinent Vitals/Pain Does not rate, but indicates pain in abdomen      Mobility  Bed Mobility Bed Mobility: Right Sidelying to Sit;Rolling Right;Sit to Sidelying Right Rolling Right: 3: Mod assist Right Sidelying to Sit: 1: +2 Total assist Right Sidelying to Sit: Patient Percentage: 40% Sit to Sidelying Right: 1: +2 Total assist Sit to Sidelying Right: Patient Percentage: 20% Details for Bed Mobility Assistance: Max cueing for attending to task, encouragement, and safe technique Transfers Transfers: Not assessed Ambulation/Gait Ambulation/Gait Assistance: Not tested (comment) Stairs: No Wheelchair Mobility Wheelchair Mobility: No    Exercises     PT Diagnosis: Difficulty walking;Acute pain  PT Problem List: Decreased strength;Decreased activity tolerance;Decreased balance;Decreased mobility;Decreased cognition;Decreased knowledge of use of DME;Decreased safety awareness;Pain PT Treatment Interventions: DME instruction;Gait training;Stair training;Functional mobility  training;Therapeutic activities;Therapeutic exercise;Balance training;Cognitive remediation;Patient/family education   PT Goals Acute Rehab PT Goals PT Goal Formulation: With family Time For Goal Achievement: 04/05/12 Potential to Achieve Goals: Good Pt will go Supine/Side to Sit: with min assist PT Goal: Supine/Side to Sit - Progress: Goal set today Pt will go Sit to Supine/Side: with min assist PT Goal: Sit to Supine/Side - Progress: Goal set today Pt will go Sit to Stand: with min assist PT Goal: Sit to Stand - Progress: Goal set today Pt will go Stand to Sit: with min assist PT Goal: Stand to Sit - Progress: Goal set today Pt will Ambulate: 16 - 50 feet;with min assist;with rolling walker PT Goal: Ambulate - Progress: Goal set today Pt will Go Up / Down Stairs: 1-2 stairs;with mod assist;with rolling walker PT Goal: Up/Down Stairs - Progress: Goal set today  Visit Information  Last PT Received On: 03/22/12 Assistance Needed: +2    Subjective Data  Subjective: pt confusion worse post-op per family.   Patient Stated Goal: Home with family.     Prior Functioning  Home Living Lives With: Spouse Available Help at Discharge: Family;Available 24 hours/day (Children to take turns A'ing pt's wife.  ) Type of Home: House Home Access: Stairs to enter [employer] of Steps: 1, 1 Entrance Stairs-Rails: None Home Layout: Multi-level;Able to live on main level with bedroom/bathroom Bathroom Shower/Tub: Health visitor: Handicapped height Additional Comments: pt's Dtr unsure of DME pt has.  She will check with pt's wife, but thinks they have a RW and maybe a built-in seat for the shower.   Prior Function Level of Independence: Independent Able to Take Stairs?: Yes Driving: No Vocation: Retired Comments: Only needed help secondary to early Dementia.   Communication Communication: No difficulties  Cognition  Overall Cognitive Status: History of cognitive  impairments - further impaired Area of Impairment: Attention;Memory;Following commands;Safety/judgement;Awareness of errors;Awareness of deficits;Problem solving;Executive functioning Arousal/Alertness: Awake/alert Orientation Level: Disoriented to;Place;Time;Situation Behavior During Session: Restless Current Attention Level: Focused Following Commands: Follows one step commands inconsistently Safety/Judgement: Decreased safety judgement for tasks assessed;Impulsive;Decreased awareness of need for assistance Awareness of Errors: Assistance required to identify errors made;Assistance required to correct errors made    Extremity/Trunk Assessment Right Lower Extremity Assessment RLE ROM/Strength/Tone: Unable to fully assess;Due to pain;Due to impaired cognition Left Lower Extremity Assessment LLE ROM/Strength/Tone: Unable to fully assess;Due to pain;Due to impaired cognition   Balance Balance Balance Assessed: Yes Static Sitting Balance Static Sitting - Balance Support: Bilateral upper extremity supported;Feet supported Static Sitting - Level of Assistance: 4: Min assist Static Sitting - Comment/# of Minutes: pt sat EOB ~84mins with max encouragement to stay sitting.  pt painful and confused wanting to lie down.    End of Session PT - End of Session Activity Tolerance: Patient limited by pain Patient left: in bed;with call bell/phone within reach;with bed alarm set;with family/visitor present Nurse Communication: Mobility status  GP     Sunny Schlein, [place] [phone] 03/22/2012, 3:27 PM

## 2012-03-22 NOTE — Progress Notes (Signed)
ANTIBIOTIC CONSULT NOTE - FOLLOW UP  Pharmacy Consult for vanc/zosyn Indication: pneumonia  Allergies  Allergen Reactions  . Chicken Allergy     unknown    Patient Measurements: Height: 5\' 10"  (177.8 cm) Weight: 222 lb (100.699 kg) IBW/kg (Calculated) : 73   Vital Signs: Temp: 97.9 F (36.6 C) (07/09 0421) Temp src: Oral (07/09 0421) BP: 129/75 mmHg (07/09 0421) Pulse Rate: 79  (07/09 0421) Intake/Output from previous day: 07/08 0701 - 07/09 0700 In: 5375.5 [I.V.:4152.1; Blood:397.5; IV Piggyback:825.9] Out: 1050 [Urine:950; Blood:100] Intake/Output from this shift:    Labs:  Basename 03/22/12 0525 03/21/12 0450 03/20/12 0448  WBC 5.2 5.5 5.3  HGB 10.8* 11.4* 11.5*  PLT 100* 85* 83*  LABCREA -- -- --  CREATININE 0.98 1.06 1.17   Estimated Creatinine Clearance: 75.1 ml/min (by C-G formula based on Cr of 0.98). No results found for this basename: VANCOTROUGH:2,VANCOPEAK:2,VANCORANDOM:2,GENTTROUGH:2,GENTPEAK:2,GENTRANDOM:2,TOBRATROUGH:2,TOBRAPEAK:2,TOBRARND:2,AMIKACINPEAK:2,AMIKACINTROU:2,AMIKACIN:2, in the last 72 hours   Microbiology: Recent Results (from the past 720 hour(s))  STOOL CULTURE     Status: Normal   Collection Time   02/23/12  7:49 AM      Component Value Range Status Comment   Specimen Description STOOL   Final    Special Requests NONE   Final    Culture     Final    Value: NO SALMONELLA, SHIGELLA, CAMPYLOBACTER, YERSINIA, OR E.COLI 0157:H7 ISOLATED   Report Status 02/26/2012 FINAL   Final   CULTURE, BLOOD (ROUTINE X 2)     Status: Normal (Preliminary result)   Collection Time   03/19/12  5:50 PM      Component Value Range Status Comment   Specimen Description BLOOD RIGHT HAND   Final    Special Requests BOTTLES DRAWN AEROBIC AND ANAEROBIC 10CC EA   Final    Culture  Setup Time 03/20/2012 02:12   Final    Culture     Final    Value:        BLOOD CULTURE RECEIVED NO GROWTH TO DATE CULTURE WILL BE HELD FOR 5 DAYS BEFORE ISSUING A FINAL NEGATIVE  REPORT   Report Status PENDING   Incomplete   CULTURE, BLOOD (ROUTINE X 2)     Status: Normal (Preliminary result)   Collection Time   03/19/12  6:00 PM      Component Value Range Status Comment   Specimen Description BLOOD LEFT HAND   Final    Special Requests BOTTLES DRAWN AEROBIC AND ANAEROBIC 10CC EA   Final    Culture  Setup Time 03/20/2012 02:12   Final    Culture     Final    Value:        BLOOD CULTURE RECEIVED NO GROWTH TO DATE CULTURE WILL BE HELD FOR 5 DAYS BEFORE ISSUING A FINAL NEGATIVE REPORT   Report Status PENDING   Incomplete   URINE CULTURE     Status: Normal   Collection Time   03/19/12  6:53 PM      Component Value Range Status Comment   Specimen Description URINE, CLEAN CATCH   Final    Special Requests NONE   Final    Culture  Setup Time 03/20/2012 02:11   Final    Colony Count 35,000 COLONIES/ML   Final    Culture     Final    Value: Multiple bacterial morphotypes present, none predominant. Suggest appropriate recollection if clinically indicated.   Report Status 03/20/2012 FINAL   Final   SURGICAL PCR SCREEN  Status: Normal   Collection Time   03/20/12 10:11 PM      Component Value Range Status Comment   MRSA, PCR NEGATIVE  NEGATIVE Final    Staphylococcus aureus NEGATIVE  NEGATIVE Final     Anti-infectives     Start     Dose/Rate Route Frequency Ordered Stop   03/20/12 1800   levofloxacin (LEVAQUIN) IVPB 750 mg     Comments: Please ask pharmacy to does for crcl      750 mg 100 mL/hr over 90 Minutes Intravenous  Once 03/19/12 1851 03/20/12 1855   03/19/12 2000   vancomycin (VANCOCIN) 1,250 mg in sodium chloride 0.9 % 250 mL IVPB        1,250 mg 166.7 mL/hr over 90 Minutes Intravenous Every 12 hours 03/19/12 1907     03/19/12 2000  piperacillin-tazobactam (ZOSYN) IVPB 3.375 g       3.375 g 12.5 mL/hr over 240 Minutes Intravenous Every 8 hours 03/19/12 1907     03/19/12 1715   levofloxacin (LEVAQUIN) IVPB 750 mg        750 mg 100 mL/hr over 90  Minutes Intravenous  Once 03/19/12 1705 03/19/12 1951          Assessment: 78 yo who was recently dx with colon CA. He presented 7/6 with severe abd pain. CT revealed PNA. D4 of vanc/zosyn for HCAP. S/p right partial colectomy on 7/8. Afebrile, crcl ~75 ml/min and stable.  7/6 Bcx x 2 ngtd 7/6 Ucx NG- final   Goal of Therapy:  Vancomycin trough level 15-20 mcg/ml  Plan:  -cont vanc 1250 mg iv q12h - trough as clinically indicated -cont Zosyn 3.375G IV q8h to be infused over 4 hours -Follow up SCr, UOP, cultures, clinical course and adjust as clinically indicated -f/u colectomy  Tomi Bamberger, PharmD Clinical Pharmacy Pager: [phone] Pharmacy: [phone] 03/22/2012 8:39 AM

## 2012-03-22 NOTE — Progress Notes (Signed)
Patient ID: John Summers, male   DOB: 12-Oct-1932, 78 y.o.   MRN: [id number] 1 Day Post-Op  Subjective: Comfortable POD#1  Objective: Vital signs in last 24 hours: Temp:  [97.9 F (36.6 C)-99.2 F (37.3 C)] 97.9 F (36.6 C) (07/09 0421) Pulse Rate:  [70-95] 79  (07/09 0421) Resp:  [5-26] 17  (07/09 0421) BP: (105-162)/(63-86) 129/75 mmHg (07/09 0421) SpO2:  [92 %-100 %] 97 % (07/09 0421) Last BM Date: 03/21/12  Intake/Output from previous day: 07/08 0701 - 07/09 0700 In: 5250.5 [I.V.:4027.1; Blood:397.5; IV Piggyback:825.9] Out: 1050 [Urine:950; Blood:100] Intake/Output this shift:    PE: Lungs clear Abdomen soft, non distended, dressing dry  Lab Results:   Basename 03/22/12 0525 03/21/12 0450  WBC 5.2 5.5  HGB 10.8* 11.4*  HCT 31.8* 33.3*  PLT 100* 85*   BMET  Basename 03/22/12 0525 03/21/12 0450  NA 140 140  K 3.7 3.4*  CL 107 105  CO2 22 26  GLUCOSE 103* 97  BUN 6 9  CREATININE 0.98 1.06  CALCIUM 8.0* 8.4   PT/INR  Basename 03/20/12 0448  LABPROT 16.5*  INR 1.31   Comprehensive Metabolic Panel:    Component Value Date/Time   NA 140 03/22/2012 0525   K 3.7 03/22/2012 0525   CL 107 03/22/2012 0525   CO2 22 03/22/2012 0525   BUN 6 03/22/2012 0525   CREATININE 0.98 03/22/2012 0525   GLUCOSE 103* 03/22/2012 0525   CALCIUM 8.0* 03/22/2012 0525   AST 48* 03/20/2012 0448   ALT 50 03/20/2012 0448   ALKPHOS 102 03/20/2012 0448   BILITOT 1.1 03/20/2012 0448   PROT 6.1 03/20/2012 0448   ALBUMIN 2.4* 03/20/2012 0448     Studies/Results: [name] Wo Contrast  03/20/2012  *RADIOLOGY REPORT*  Clinical Data: Colon cancer.  Worsening confusion.  Assess for metastatic disease.  MRI HEAD WITHOUT AND WITH CONTRAST  Technique:  Multiplanar, multiecho pulse sequences of the brain and surrounding structures were obtained according to standard protocol without and with intravenous contrast  Contrast: 20mL MULTIHANCE GADOBENATE DIMEGLUMINE 529 MG/ML IV SOLN  Comparison: 11/26/2009   Findings: Diffusion imaging does not show any acute or subacute infarction.  There are minimal small vessel changes in the pons. No cerebellar abnormality.  The cerebral hemispheres show chronic small vessel changes in the deep and subcortical white matter.  No cortical or large vessel territory infarction.  No primary or metastatic mass lesion, hemorrhage, hydrocephalus or extra-axial collection.  After contrast administration, no abnormal enhancement occurs.  No pituitary mass.  No skull or skull base lesion.  IMPRESSION: No acute finding.  No metastatic disease or acute infarction.  Chronic small vessel changes of the hemispheric white matter, fairly typical for age.  Original Report Authenticated By: Thomasenia Sales, M.D.   Dg Chest Port 1 View  03/20/2012  *RADIOLOGY REPORT*  Clinical Data: Pneumonia  PORTABLE CHEST - 1 VIEW  Comparison: 03/19/2012; chest CT - 03/09/2012  Findings: Grossly unchanged cardiac silhouette and mediastinal contours with mild elevation of the right hemidiaphragm.  Interval increase in right upper lung ill-defined heterogeneous possible airspace opacities.  Bibasilar heterogeneous opacities are grossly unchanged.  No definite pleural effusion or pneumothorax. Unchanged bones.  IMPRESSION: Interval increase in ill-defined heterogeneous opacities within the right upper lung, worrisome for progression of infection. Continued attention on follow-up is recommended.  Original Report Authenticated By: Waynard Reeds, M.D.    Anti-infectives: Anti-infectives     Start     Dose/Rate  Route Frequency Ordered Stop   03/20/12 1800   levofloxacin (LEVAQUIN) IVPB 750 mg     Comments: Please ask pharmacy to does for crcl      750 mg 100 mL/hr over 90 Minutes Intravenous  Once 03/19/12 1851 03/20/12 1855   03/19/12 2000   vancomycin (VANCOCIN) 1,250 mg in sodium chloride 0.9 % 250 mL IVPB        1,250 mg 166.7 mL/hr over 90 Minutes Intravenous Every 12 hours 03/19/12 1907     03/19/12  2000  piperacillin-tazobactam (ZOSYN) IVPB 3.375 g       3.375 g 12.5 mL/hr over 240 Minutes Intravenous Every 8 hours 03/19/12 1907     03/19/12 1715   levofloxacin (LEVAQUIN) IVPB 750 mg        750 mg 100 mL/hr over 90 Minutes Intravenous  Once 03/19/12 1705 03/19/12 1951          Assessment Active Problems:  Adenocarcinoma Of Cecum  Abdominal pain, acute  HCAP (healthcare-associated pneumonia)  GERD (gastroesophageal reflux disease) s/p right partial colectomy   LOS: 3 days   Plan:  Leave foley until tomorrow Keep on ice chips PT/OT consult  Jahni Nazar A 03/22/2012

## 2012-03-22 NOTE — Consult Note (Signed)
This patient recently had surgery for colon cancer. The pathology report is not currently available. He is also an active patient of one of the oncologists at [facility]. This would anticipate following up with her doctor locally as it is closer to their home. I plan to see the patient when the pathology report has become available.  Pierce Crane M.D. FRCP C.

## 2012-03-23 ENCOUNTER — Encounter (INDEPENDENT_AMBULATORY_CARE_PROVIDER_SITE_OTHER): Payer: 59 | Admitting: General Surgery

## 2012-03-23 DIAGNOSIS — C180 Malignant neoplasm of cecum: Principal | ICD-10-CM

## 2012-03-23 DIAGNOSIS — F0390 Unspecified dementia without behavioral disturbance: Secondary | ICD-10-CM

## 2012-03-23 LAB — GLUCOSE, CAPILLARY: Glucose-Capillary: 124 mg/dL — ABNORMAL HIGH (ref 70–99)

## 2012-03-23 LAB — COMPREHENSIVE METABOLIC PANEL
AST: 41 U/L — ABNORMAL HIGH (ref 0–37)
Albumin: 2.3 g/dL — ABNORMAL LOW (ref 3.5–5.2)
Calcium: 8.1 mg/dL — ABNORMAL LOW (ref 8.4–10.5)
Creatinine, Ser: 0.91 mg/dL (ref 0.50–1.35)

## 2012-03-23 LAB — CBC
MCH: 32.8 pg (ref 26.0–34.0)
MCV: 95.5 fL (ref 78.0–100.0)
Platelets: 124 10*3/uL — ABNORMAL LOW (ref 150–400)
RDW: 13.8 % (ref 11.5–15.5)
WBC: 5.2 10*3/uL (ref 4.0–10.5)

## 2012-03-23 LAB — URINE MICROSCOPIC-ADD ON

## 2012-03-23 LAB — URINALYSIS, ROUTINE W REFLEX MICROSCOPIC
Glucose, UA: NEGATIVE mg/dL
Specific Gravity, Urine: 1.019 (ref 1.005–1.030)

## 2012-03-23 MED ORDER — QUETIAPINE 12.5 MG HALF TABLET
12.5000 mg | ORAL_TABLET | Freq: Every day | ORAL | Status: DC
  Administered 2012-03-23 – 2012-03-27 (×5): 12.5 mg via ORAL
  Filled 2012-03-23 (×7): qty 1

## 2012-03-23 MED ORDER — HYDROMORPHONE HCL PF 1 MG/ML IJ SOLN
1.0000 mg | INTRAMUSCULAR | Status: DC | PRN
  Administered 2012-03-24 – 2012-03-26 (×5): 1 mg via INTRAVENOUS
  Filled 2012-03-23 (×5): qty 1

## 2012-03-23 MED ORDER — HALOPERIDOL LACTATE 5 MG/ML IJ SOLN: 0.5000 mg | Freq: Once | INTRAMUSCULAR | Status: DC | PRN

## 2012-03-23 MED ORDER — CHLORHEXIDINE GLUCONATE 0.12 % MT SOLN
OROMUCOSAL | Status: AC
  Administered 2012-03-23: 15 mL via OROMUCOSAL
  Filled 2012-03-23: qty 15

## 2012-03-23 MED ORDER — DEXTROSE-NACL 5-0.45 % IV SOLN
INTRAVENOUS | Status: DC
  Administered 2012-03-23 – 2012-03-25 (×4): via INTRAVENOUS

## 2012-03-23 NOTE — Progress Notes (Signed)
ANTIBIOTIC CONSULT NOTE - FOLLOW UP  Pharmacy Consult for Vancomycin/Zosyn Indication: pneumonia  Allergies  Allergen Reactions  . Chicken Allergy     unknown    Patient Measurements: Height: 5\' 10"  (177.8 cm) Weight: 222 lb (100.699 kg) IBW/kg (Calculated) : 73  Adjusted Body Weight:   Vital Signs: Temp: 98.6 F (37 C) (07/10 2202) Temp src: Oral (07/10 2202) BP: 159/84 mmHg (07/10 2202) Pulse Rate: 79  (07/10 2202) Intake/Output from previous day: 07/09 0701 - 07/10 0700 In: 2778.8 [I.V.:2778.8] Out: 1650 [Urine:1650] Intake/Output from this shift: Total I/O In: -  Out: 550 [Urine:550]  Labs:  [facility] 03/23/12 0318 03/22/12 0525 03/21/12 0450  WBC 5.2 5.2 5.5  HGB 10.9* 10.8* 11.4*  PLT 124* 100* 85*  LABCREA -- -- --  CREATININE 0.91 0.98 1.06   Estimated Creatinine Clearance: 80.9 ml/min (by C-G formula based on Cr of 0.91).  Basename 03/23/12 1950  VANCOTROUGH 15.5  VANCOPEAK --  Drue Dun --  GENTTROUGH --  GENTPEAK --  GENTRANDOM --  TOBRATROUGH --  TOBRAPEAK --  TOBRARND --  AMIKACINPEAK --  AMIKACINTROU --  AMIKACIN --     Microbiology: Recent Results (from the past 720 hour(s))  STOOL CULTURE     Status: Normal   Collection Time   02/23/12  7:49 AM      Component Value Range Status Comment   Specimen Description STOOL   Final    Special Requests NONE   Final    Culture     Final    Value: NO SALMONELLA, SHIGELLA, CAMPYLOBACTER, YERSINIA, OR E.COLI 0157:H7 ISOLATED   Report Status 02/26/2012 FINAL   Final   CULTURE, BLOOD (ROUTINE X 2)     Status: Normal (Preliminary result)   Collection Time   03/19/12  5:50 PM      Component Value Range Status Comment   Specimen Description BLOOD RIGHT HAND   Final    Special Requests BOTTLES DRAWN AEROBIC AND ANAEROBIC 10CC EA   Final    Culture  Setup Time 03/20/2012 02:12   Final    Culture     Final    Value:        BLOOD CULTURE RECEIVED NO GROWTH TO DATE CULTURE WILL BE HELD FOR 5 DAYS  BEFORE ISSUING A FINAL NEGATIVE REPORT   Report Status PENDING   Incomplete   CULTURE, BLOOD (ROUTINE X 2)     Status: Normal (Preliminary result)   Collection Time   03/19/12  6:00 PM      Component Value Range Status Comment   Specimen Description BLOOD LEFT HAND   Final    Special Requests BOTTLES DRAWN AEROBIC AND ANAEROBIC 10CC EA   Final    Culture  Setup Time 03/20/2012 02:12   Final    Culture     Final    Value:        BLOOD CULTURE RECEIVED NO GROWTH TO DATE CULTURE WILL BE HELD FOR 5 DAYS BEFORE ISSUING A FINAL NEGATIVE REPORT   Report Status PENDING   Incomplete   URINE CULTURE     Status: Normal   Collection Time   03/19/12  6:53 PM      Component Value Range Status Comment   Specimen Description URINE, CLEAN CATCH   Final    Special Requests NONE   Final    Culture  Setup Time 03/20/2012 02:11   Final    Colony Count 35,000 COLONIES/ML   Final    Culture  Final    Value: Multiple bacterial morphotypes present, none predominant. Suggest appropriate recollection if clinically indicated.   Report Status 03/20/2012 FINAL   Final   SURGICAL PCR SCREEN     Status: Normal   Collection Time   03/20/12 10:11 PM      Component Value Range Status Comment   MRSA, PCR NEGATIVE  NEGATIVE Final    Staphylococcus aureus NEGATIVE  NEGATIVE Final     Anti-infectives     Start     Dose/Rate Route Frequency Ordered Stop   03/20/12 1800   levofloxacin (LEVAQUIN) IVPB 750 mg     Comments: Please ask pharmacy to does for crcl      750 mg 100 mL/hr over 90 Minutes Intravenous  Once 03/19/12 1851 03/20/12 1855   03/19/12 2000   vancomycin (VANCOCIN) 1,250 mg in sodium chloride 0.9 % 250 mL IVPB        1,250 mg 166.7 mL/hr over 90 Minutes Intravenous Every 12 hours 03/19/12 1907 03/26/12 1959   03/19/12 2000  piperacillin-tazobactam (ZOSYN) IVPB 3.375 g       3.375 g 12.5 mL/hr over 240 Minutes Intravenous Every 8 hours 03/19/12 1907 03/26/12 1959   03/19/12 1715   levofloxacin  (LEVAQUIN) IVPB 750 mg        750 mg 100 mL/hr over 90 Minutes Intravenous  Once 03/19/12 1705 03/19/12 1951          Assessment: 78 yr old male diagnosed with cecal adenocarcinoma in June. Admitted with a focal perforation of the cecum and had a colectomy done on 7/8. He was noted to have pneumonia on a CT scan. This is day5 of Vanc/Zosyn. CrCl is 80 ml/min.  Goal of Therapy:  Vancomycin trough level 15-20 mcg/ml  Plan:  Vancomycin trough was checked today. Trough = 15.5. That is within the desired trough range. Will continue with same dose of Vancomycin 1250 mg q12h.  John Summers 03/23/2012,10:05 PM

## 2012-03-23 NOTE — Progress Notes (Signed)
Clinical Social Work Department BRIEF PSYCHOSOCIAL ASSESSMENT 03/23/2012  Patient:  John Summers, John Summers     Account Number:  [id number]     Admit date:  03/19/2012  Clinical Social Worker:  Dennison Bulla  Date/Time:  03/23/2012 11:45 AM  Referred by:  Physician  Date Referred:  03/23/2012 Referred for  SNF Placement   Other Referral:   Interview type:  Family Other interview type:   Spouse-John Summers    PSYCHOSOCIAL DATA Living Status:  FAMILY Admitted from facility:   Level of care:   Primary support name:  Darel Hong Primary support relationship to patient:  SPOUSE Degree of support available:   Strong    CURRENT CONCERNS Current Concerns  Post-Acute Placement   Other Concerns:    SOCIAL WORK ASSESSMENT / PLAN CSW received referral to assist with dc planning. CSW reviewed chart and met with patient and wife in room at bedside. Patient was unable to participate in assessment so CSW discussed plans with wife.    CSW explained role and introduced myself. CSW spoke with wife about prior living arrangements before admission. Wife reports that patient had been ambulating well at home and using some equipment. CSW spoke with wife regarding dc plans of HH vs CIR vs SNF. Wife reports that CIR is first option. CSW discussed having alternative option and wife was agreeable to SNF search. Wife reports patient was at SNF around 10 years ago after he had hip replaced. Wife is interested in [facility], [facility], [place] or [facility]	[place]. CSW provided wife with SNF list and explained process. Wife is aware of copays with insurance and agreeable to SNF.    CSW completed FL2 and faxed out. CSW will follow up with bed offers.   Assessment/plan status:  Psychosocial Support/Ongoing Assessment of Needs Other assessment/ plan:   Information/referral to community resources:   SNF list    PATIENT'S/FAMILY'S RESPONSE TO PLAN OF CARE: Patient did not participate in assessment. Wife was  receptive to consult and agreeable to SNF. Wife asked appropriate questions and agreeable to SNF search.

## 2012-03-23 NOTE — Progress Notes (Signed)
Occupational Therapy Evaluation Patient Details Name: WALTER GRIMA MRN: [id number] DOB: 09-04-1933 Today's Date: 03/23/2012 Time: 1191-4782 OT Time Calculation (min): 30 min  OT Assessment / Plan / Recommendation Clinical Impression  78 y.o.pt. presents with R Hemicolectomy secondary to Adenocarcinoma.  pt with early dementia at baseline, now worse post-op. Pt. will benefit from OT to increase independence and safety in ADLs prior to d/c.     OT Assessment  Patient needs continued OT Services    Follow Up Recommendations  Inpatient Rehab    Barriers to Discharge      Equipment Recommendations  Defer to next venue    Recommendations for Other Services  Rehab Consult  Frequency  Min 2X/week    Precautions / Restrictions Precautions Precautions: Fall Restrictions Weight Bearing Restrictions: No   Pertinent Vitals/Pain Vitals Monitored and Stable. No pain reported, however, when standing pt. Indicated pain but could not communicate where.    ADL  Eating/Feeding: Performed;Maximal assistance Where Assessed - Eating/Feeding: Chair Toilet Transfer: Simulated;+2 Total assistance Toilet Transfer: Patient Percentage: 20% Toilet Transfer Method: Sit to stand Toilet Transfer Equipment: Raised toilet seat with arms (or 3-in-1 over toilet) Equipment Used: Gait belt;Rolling walker ADL Comments: Pt. was confused and not oriented in session. He stated that he saw a snake and spider webs. Pt. required Max A to feed himself due to lack of initiation. OT gave tactile (hand over hand) and vc's. Once initiated, pt. fed himself a few bites. Pt. maintained sustained attention for approx. 15 sec. in feeding once tactile and vc's were provided.    OT Diagnosis: Generalized weakness;Acute pain;Ataxia;Cognitive deficits  OT Problem List: Decreased strength;Decreased activity tolerance;Impaired balance (sitting and/or standing);Decreased knowledge of use of DME or AE;Decreased safety  awareness;Decreased cognition;Pain OT Treatment Interventions: Self-care/ADL training;Therapeutic exercise;DME and/or AE instruction;Cognitive remediation/compensation;Patient/family education;Balance training   OT Goals Acute Rehab OT Goals OT Goal Formulation: With patient/family Time For Goal Achievement: 04/06/12 Potential to Achieve Goals: Good ADL Goals Pt Will Perform Grooming: Standing at sink;with min assist ADL Goal: Grooming - Progress: Goal set today Pt Will Perform Upper Body Bathing: Sitting at sink;with min assist ADL Goal: Upper Body Bathing - Progress: Goal set today Pt Will Perform Lower Body Bathing: Sit to stand from chair;with min assist ADL Goal: Lower Body Bathing - Progress: Goal set today Pt Will Perform Upper Body Dressing: Sitting, chair;Sitting, bed;with min assist ADL Goal: Upper Body Dressing - Progress: Goal set today Pt Will Perform Lower Body Dressing: Sit to stand from chair;Sit to stand from bed;with min assist ADL Goal: Lower Body Dressing - Progress: Goal set today Pt Will Transfer to Toilet: with min assist ADL Goal: Toilet Transfer - Progress: Goal set today Pt Will Perform Toileting - Clothing Manipulation: with min assist;Standing ADL Goal: Toileting - Clothing Manipulation - Progress: Goal set today Pt Will Perform Toileting - Hygiene: with min assist;Sit to stand from 3-in-1/toilet ADL Goal: Toileting - Hygiene - Progress: Goal set today  Visit Information  Last OT Received On: 03/23/12 Assistance Needed: +2 PT/OT Co-Evaluation/Treatment: Yes    Subjective Data  Subjective: "There's a snake."    Prior Functioning  Home Living Lives With: Spouse Available Help at Discharge: Family;Available 24 hours/day Type of Home: House Home Access: Stairs to enter [employer] of Steps: 1, 1 Entrance Stairs-Rails: None Home Layout: Multi-level;Able to live on main level with bedroom/bathroom Bathroom Shower/Tub: Architectural technologist: Handicapped height Bathroom Accessibility: Yes How Accessible: Accessible via walker Home Adaptive Equipment: Dan Humphreys -  rolling;Bedside commode/3-in-1 (built-in shower seat) Prior Function Level of Independence: Independent Able to Take Stairs?: Yes Driving: No Vocation: Retired Musician: No difficulties Dominant Hand: Right    Cognition  Overall Cognitive Status: History of cognitive impairments - further impaired Area of Impairment: Attention;Memory;Following commands;Safety/judgement;Awareness of errors;Awareness of deficits;Problem solving;Executive functioning Arousal/Alertness: Awake/alert Orientation Level: Disoriented to;Place;Time;Situation Behavior During Session: Restless Current Attention Level: Focused Following Commands: Follows one step commands inconsistently Safety/Judgement: Decreased safety judgement for tasks assessed;Impulsive;Decreased awareness of need for assistance Awareness of Errors: Assistance required to identify errors made;Assistance required to correct errors made Awareness of Deficits: unaware of need for assistance or weakness    Extremity/Trunk Assessment Right Upper Extremity Assessment RUE ROM/Strength/Tone: [facility] for tasks assessed RUE Coordination: Deficits RUE Coordination Deficits: Pt. overshooting when bringing food to mouth and retreiving food. When asked to touch head, pt. missed head.  Left Upper Extremity Assessment LUE ROM/Strength/Tone: WFL for tasks assessed LUE Coordination: Deficits LUE Coordination Deficits: Pt. overshooting when asked to touch head.    Mobility Bed Mobility Bed Mobility: Not assessed Transfers Transfers: Sit to Stand;Stand to Sit Sit to Stand: 1: +2 Total assist;With upper extremity assist;From chair/3-in-1 Sit to Stand: Patient Percentage: 20% Stand to Sit: 1: +2 Total assist;To chair/3-in-1;With upper extremity assist Stand to Sit: Patient Percentage: 30% Details  for Transfer Assistance: VC for hand placement. Pt required increased time to complete as he was initially resisting standing. Max cueing for proper sequencing and safety.   Exercise    Balance Balance Balance Assessed: Yes Static Standing Balance Static Standing - Balance Support: Bilateral upper extremity supported Static Standing - Level of Assistance: 2: Max assist Static Standing - Comment/# of Minutes: Max assist for safety as pt with weakness and inattention limiting safety  End of Session OT - End of Session Patient left: in chair;with call bell/phone within reach;with family/visitor present  GO     Orelia Brandstetter, Mardella Layman 03/23/2012, 12:05 PM

## 2012-03-23 NOTE — Progress Notes (Addendum)
Clinical Social Work Department CLINICAL SOCIAL WORK PLACEMENT NOTE 03/23/2012  Patient:  John Summers, John Summers  Account Number:  [id number] Admit date:  03/19/2012  Clinical Social Worker:  Unk Lightning, LCSW  Date/time:  03/23/2012 11:30 AM  Clinical Social Work is seeking post-discharge placement for this patient at the following level of care:   SKILLED NURSING   (*CSW will update this form in Epic as items are completed)   03/23/2012  Patient/family provided with [facility] Department of Clinical Social Work's list of facilities offering this level of care within the geographic area requested by the patient (or if unable, by the patient's family).  03/23/2012  Patient/family informed of their freedom to choose among providers that offer the needed level of care, that participate in Medicare, Medicaid or managed care program needed by the patient, have an available bed and are willing to accept the patient.  03/23/2012  Patient/family informed of [facility]' ownership interest in [facility], as well as of the fact that they are under no obligation to receive care at this facility.  PASARR submitted to EDS on 03/23/2012 PASARR number received from EDS on 03/23/2012  FL2 transmitted to all facilities in geographic area requested by pt/family on  03/23/2012 FL2 transmitted to all facilities within larger geographic area on   Patient informed that his/her managed care company has contracts with or will negotiate with  certain facilities, including the following:     Patient/family informed of bed offers received:  03/25/12, 03/28/12 Patient chooses bed at [facility] Physician recommends and patient chooses bed at    Patient to be transferred to [facility] on  03/28/12 Patient to be transferred to facility by family  The following physician request were entered in Epic:   Additional Comments:

## 2012-03-23 NOTE — Progress Notes (Signed)
Patient ID: John Summers, male   DOB: 1933-05-11, 78 y.o.   MRN: [id number] Patient ID: John Summers, male   DOB: May 22, 1933, 78 y.o.   MRN: [id number] 2 Days Post-Op  Subjective: He appears comfortable, but is confused with visual hallucinations, has not been sleeping at night. Wife and daughter at bedside. POD#2  Objective: Vital signs in last 24 hours: Temp:  [97.8 F (36.6 C)-99.7 F (37.6 C)] 98.7 F (37.1 C) (07/10 0751) Pulse Rate:  [91-94] 91  (07/10 0352) Resp:  [16-26] 18  (07/10 0400) BP: (134-152)/(70-87) 152/85 mmHg (07/10 0352) SpO2:  [93 %-98 %] 96 % (07/10 0400) Last BM Date: 03/22/12  Intake/Output from previous day: 07/09 0701 - 07/10 0700 In: 2778.8 [I.V.:2778.8] Out: 1650 [Urine:1650] Intake/Output this shift:    PE: Lungs clear Abdomen soft, non distended, dressing dry, with old blood from umbilical area. Dressing changed, umbilicus dry. Staples intact, no drainage or erythema noted, occasional BS heard; BM x 1 recorded. No bloating, no rebound tenderness.  Lab Results:   Basename 03/23/12 0318 03/22/12 0525  WBC 5.2 5.2  HGB 10.9* 10.8*  HCT 31.7* 31.8*  PLT 124* 100*   BMET  Basename 03/23/12 0318 03/22/12 0525  NA 148* 140  K 3.5 3.7  CL 113* 107  CO2 22 22  GLUCOSE 110* 103*  BUN 6 6  CREATININE 0.91 0.98  CALCIUM 8.1* 8.0*   PT/INR No results found for this basename: LABPROT:2,INR:2 in the last 72 hours Comprehensive Metabolic Panel:    Component Value Date/Time   NA 148* 03/23/2012 0318   K 3.5 03/23/2012 0318   CL 113* 03/23/2012 0318   CO2 22 03/23/2012 0318   BUN 6 03/23/2012 0318   CREATININE 0.91 03/23/2012 0318   GLUCOSE 110* 03/23/2012 0318   CALCIUM 8.1* 03/23/2012 0318   AST 41* 03/23/2012 0318   ALT 35 03/23/2012 0318   ALKPHOS 99 03/23/2012 0318   BILITOT 0.6 03/23/2012 0318   PROT 6.0 03/23/2012 0318   ALBUMIN 2.3* 03/23/2012 0318     Studies/Results: No results found.  Anti-infectives: Anti-infectives    Start     Dose/Rate Route Frequency Ordered Stop   03/20/12 1800   levofloxacin (LEVAQUIN) IVPB 750 mg     Comments: Please ask pharmacy to does for crcl      750 mg 100 mL/hr over 90 Minutes Intravenous  Once 03/19/12 1851 03/20/12 1855   03/19/12 2000   vancomycin (VANCOCIN) 1,250 mg in sodium chloride 0.9 % 250 mL IVPB        1,250 mg 166.7 mL/hr over 90 Minutes Intravenous Every 12 hours 03/19/12 1907     03/19/12 2000   piperacillin-tazobactam (ZOSYN) IVPB 3.375 g        3.375 g 12.5 mL/hr over 240 Minutes Intravenous Every 8 hours 03/19/12 1907     03/19/12 1715   levofloxacin (LEVAQUIN) IVPB 750 mg        750 mg 100 mL/hr over 90 Minutes Intravenous  Once 03/19/12 1705 03/19/12 1951          Assessment Active Problems:  Adenocarcinoma Of Cecum  Abdominal pain, acute  HCAP (healthcare-associated pneumonia)  GERD (gastroesophageal reflux disease) s/p right partial colectomy   LOS: 4 days   Plan:  Advance diet to clear liquid PT/OT consult ? Transfer to floor (will need placement close to nursing station) Recommend use of CPAP at night for his documented sleep apnea.  Blenda Mounts 03/23/2012

## 2012-03-23 NOTE — Progress Notes (Signed)
I agree with the following treatment note after reviewing documentation.   Johnston, Jory Tanguma Brynn   OTR/L Pager: [phone] Office: [phone] .   

## 2012-03-23 NOTE — Progress Notes (Signed)
Physical Therapy Treatment Patient Details Name: John Summers MRN: [id number] DOB: November 30, 1932 Today's Date: 03/23/2012 Time: 4696-2952 PT Time Calculation (min): 24 min  PT Assessment / Plan / Recommendation Comments on Treatment Session  Pt able to ambulate this session, although unable to follow any commands for mobility. Spoke with wife regarding possible ST-rehab prior to return home secondary to safety for the pt and the family., wife agreeable.    Follow Up Recommendations  Inpatient Rehab;Skilled nursing facility;Supervision/Assistance - 24 hour    Barriers to Discharge        Equipment Recommendations  Defer to next venue    Recommendations for Other Services Rehab consult  Frequency Min 3X/week   Plan Discharge plan needs to be updated;Frequency remains appropriate    Precautions / Restrictions Precautions Precautions: Fall Restrictions Weight Bearing Restrictions: No       Mobility  Bed Mobility Bed Mobility: Not assessed Transfers Transfers: Sit to Stand;Stand to Sit Sit to Stand: 1: +2 Total assist;With upper extremity assist;From chair/3-in-1 Sit to Stand: Patient Percentage: 20% Stand to Sit: 1: +2 Total assist;To chair/3-in-1;With upper extremity assist Stand to Sit: Patient Percentage: 30% Details for Transfer Assistance: VC for hand placement. Pt required increased time to complete as he was initially resisting standing. Max cueing for proper sequencing and safety. Ambulation/Gait Ambulation/Gait Assistance: 1: +2 Total assist Ambulation/Gait: Patient Percentage: 40% Ambulation Distance (Feet): 7 Feet Assistive device: Rolling walker Ambulation/Gait Assistance Details: VC for proper sequencing. Pt able to complete steps with max tactile facilitation through the pelvis and trunk. +2 assistance for safety. Gait Pattern: Step-to pattern;Decreased stride length;Decreased hip/knee flexion - right;Decreased hip/knee flexion - left;Antalgic;Wide base of  support Gait velocity: decreased gait speed General Gait Details: pt able to maintain balance during ambulation, safety and balance limited due to inattention Stairs: No    Exercises     PT Diagnosis:    PT Problem List:   PT Treatment Interventions:     PT Goals Acute Rehab PT Goals PT Goal Formulation: With family PT Goal: Sit to Stand - Progress: Progressing toward goal PT Goal: Stand to Sit - Progress: Progressing toward goal PT Goal: Ambulate - Progress: Progressing toward goal  Visit Information  Last PT Received On: 03/23/12 Assistance Needed: +2    Subjective Data  Subjective: Were not in the hospital, we're on a boat   Cognition  Overall Cognitive Status: History of cognitive impairments - further impaired Area of Impairment: Attention;Memory;Following commands;Safety/judgement;Awareness of errors;Awareness of deficits;Problem solving;Executive functioning Arousal/Alertness: Awake/alert Orientation Level: Disoriented to;Place;Time;Situation Behavior During Session: Restless Current Attention Level: Focused Following Commands: Follows one step commands inconsistently Safety/Judgement: Decreased safety judgement for tasks assessed;Impulsive;Decreased awareness of need for assistance Awareness of Errors: Assistance required to identify errors made;Assistance required to correct errors made Awareness of Deficits: unaware of need for assistance or weakness    Balance  Balance Balance Assessed: Yes Static Standing Balance Static Standing - Balance Support: Bilateral upper extremity supported Static Standing - Level of Assistance: 2: Max assist Static Standing - Comment/# of Minutes: Max assist for safety as pt with weakness and inattention limiting safety  End of Session PT - End of Session Equipment Utilized During Treatment: Gait belt Activity Tolerance: Patient limited by pain Patient left: in chair;with call bell/phone within reach;with chair alarm set;with  family/visitor present Nurse Communication: Mobility status   GP     Milana Kidney 03/23/2012, 9:19 AM  03/23/2012 Milana Kidney DPT PAGER: [phone] OFFICE: [phone]

## 2012-03-23 NOTE — Progress Notes (Signed)
Pt tx from 3300 today. Pt oriented to unit and given call bell. Pt family members at bedside and Rn set bed alarm.

## 2012-03-23 NOTE — Progress Notes (Signed)
Pt had Benadryl and fell to sleep, awoke startled and had some seizure like activity. VSS, could respond to staff, recognized wife. Wife stated he has nightmares at home.  Got Pt back to bed. Will continue to monitor.

## 2012-03-23 NOTE — Progress Notes (Signed)
[facility] TEAM 1 - Stepdown/ICU TEAM  PCP:  Hannah Beat, MD  Subjective: 78 y.o. 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 He came in with worsening right lower quadrant pain, and was found to have focal perforation of the cecum, s/p right colectomy on 03/21/12 by Dr Magnus Ivan. He also has an infiltrate incidentally noted on a CT scan of the abdomen.consistent with pneumonia.  His postoperative course has been complicated by acute delirium.  It is noted in his old records that he has a history of sundowning.  At the present time he is alert but confused and appears to be hallucinating.  He is unable to provide a reliable history as a result.  Objective:  Intake/Output Summary (Last 24 hours) at 03/23/12 1506 Last data filed at 03/23/12 1203  Gross per 24 hour  Intake 2778.75 ml  Output   1175 ml  Net 1603.75 ml   Blood pressure 174/85, pulse 76, temperature 98.7 F (37.1 C), temperature source Oral, resp. rate 23, height 5\' 10"  (1.778 m), weight 100.699 kg (222 lb), SpO2 95.00%.  CBG (last 3)   Basename 03/23/12 0555  GLUCAP 124*   Physical Exam: General: No acute respiratory distress - confused but not currently agitated Lungs: Clear to auscultation bilaterally without wheezes or crackles Cardiovascular: Regular rate and rhythm without murmur gallop or rub  Abdomen: Mildly protuberant, nontender, nondistended, soft, bowel sounds hypoactive but positive, no rebound, no ascites, no appreciable mass Extremities: No significant cyanosis, clubbing, or edema bilateral lower extremities  Lab Results:  Basename 03/23/12 0318 03/22/12 0525 03/21/12 0450  NA 148* 140 140  K 3.5 3.7 3.4*  CL 113* 107 105  CO2 22 22 26   GLUCOSE 110* 103* 97  BUN 6 6 9   CREATININE 0.91 0.98 1.06  CALCIUM 8.1* 8.0* 8.4  MG -- -- --  PHOS -- -- --    Basename 03/23/12 0318  AST 41*  ALT 35  ALKPHOS 99  BILITOT 0.6  PROT 6.0  ALBUMIN 2.3*    Basename  03/23/12 0318 03/22/12 0525 03/21/12 0450  WBC 5.2 5.2 5.5  NEUTROABS -- -- --  HGB 10.9* 10.8* 11.4*  HCT 31.7* 31.8* 33.3*  MCV 95.5 97.0 96.8  PLT 124* 100* 85*   Micro Results: Recent Results (from the past 240 hour(s))  CULTURE, BLOOD (ROUTINE X 2)     Status: Normal (Preliminary result)   Collection Time   03/19/12  5:50 PM      Component Value Range Status Comment   Specimen Description BLOOD RIGHT HAND   Final    Special Requests BOTTLES DRAWN AEROBIC AND ANAEROBIC 10CC EA   Final    Culture  Setup Time 03/20/2012 02:12   Final    Culture     Final    Value:        BLOOD CULTURE RECEIVED NO GROWTH TO DATE CULTURE WILL BE HELD FOR 5 DAYS BEFORE ISSUING A FINAL NEGATIVE REPORT   Report Status PENDING   Incomplete   CULTURE, BLOOD (ROUTINE X 2)     Status: Normal (Preliminary result)   Collection Time   03/19/12  6:00 PM      Component Value Range Status Comment   Specimen Description BLOOD LEFT HAND   Final    Special Requests BOTTLES DRAWN AEROBIC AND ANAEROBIC 10CC EA   Final    Culture  Setup Time 03/20/2012 02:12   Final    Culture     Final  Value:        BLOOD CULTURE RECEIVED NO GROWTH TO DATE CULTURE WILL BE HELD FOR 5 DAYS BEFORE ISSUING A FINAL NEGATIVE REPORT   Report Status PENDING   Incomplete   URINE CULTURE     Status: Normal   Collection Time   03/19/12  6:53 PM      Component Value Range Status Comment   Specimen Description URINE, CLEAN CATCH   Final    Special Requests NONE   Final    Culture  Setup Time 03/20/2012 02:11   Final    Colony Count 35,000 COLONIES/ML   Final    Culture     Final    Value: Multiple bacterial morphotypes present, none predominant. Suggest appropriate recollection if clinically indicated.   Report Status 03/20/2012 FINAL   Final   SURGICAL PCR SCREEN     Status: Normal   Collection Time   03/20/12 10:11 PM      Component Value Range Status Comment   MRSA, PCR NEGATIVE  NEGATIVE Final    Staphylococcus aureus NEGATIVE   NEGATIVE Final     Studies/Results: All recent x-ray/radiology reports have been reviewed in detail.   Medications: I have reviewed the patient's complete medication list.  Assessment/Plan:  Adenocarcinoma of the cecum Status post right partial colectomy - General Surgery is following  Healthcare acquired pneumonia Remains on empiric antibiotic therapy - will complete a seven day course  Acute delirium in the setting of Dementia at baseline patient has a known history of sundowning noted during previous hospital stays - we will avoid all other sedating medications, especially benzodiazepines and sedating antihistamines - we will initiate nightly Seroquel - we will transfer to a medical unit as soon as possible  Sleep apnea An attempt to resume CPAP in the setting of acute delirium would be ill advised - if the patient's sundowning resolves, resumption of his home CPAP regimen could then be considered  Gastroesophageal reflux disease Appears to be well-controlled at present  Hypertension Poorly controlled - likely related to the patient's delirium - I will adjust his treatment plan  Disposition Stable for transfer to the surgical floor  Lonia Blood, MD [facility] Office  [phone] Pager [phone]  On-Call/Text Page:      [email or web address]      password [facility]

## 2012-03-23 NOTE — Progress Notes (Deleted)
Occupational Therapy Evaluation Patient Details Name: John Summers MRN: [id number] DOB: 29-Jul-1933 Today's Date: 03/23/2012 Time: 1478-2956 OT Time Calculation (min): 30 min  OT Assessment / Plan / Recommendation Clinical Impression  Pt. was unable to follow any commands for mobility, but did ambulate short distance. Pt. Performed feeding with Max A. Pt. was not oriented and was extremely confused in session. Pt. would benefit from OT to maximize independence and safety in ADLs prior to d/c. OT to follow acutely.    OT Assessment  Patient needs continued OT Services    Follow Up Recommendations  Inpatient Rehab    Barriers to Discharge      Equipment Recommendations  Defer to next venue    Recommendations for Other Services    Frequency  Min 2X/week    Precautions / Restrictions Precautions Precautions: Fall Restrictions Weight Bearing Restrictions: No   Pertinent Vitals/Pain Vitals Monitored and Stable. No pain reported when asked at beginning of session, however, pt. C/o pain throughout session but could not verbalize where.    ADL  Eating/Feeding: Performed;Maximal assistance Where Assessed - Eating/Feeding: Chair Toilet Transfer: Simulated;+2 Total assistance Toilet Transfer: Patient Percentage: 20% Toilet Transfer Method: Sit to stand Toilet Transfer Equipment: Raised toilet seat with arms (or 3-in-1 over toilet) Equipment Used: Gait belt;Rolling walker ADL Comments: Pt. was confused and not oriented in session. He stated that he saw a snake and spider webs. Pt. required Max A to feed himself due to lack of initiation. OT gave tactile (hand over hand) and vc's. Once initiated, pt. fed himself a few bites.      OT Diagnosis: Generalized weakness;Acute pain;Ataxia;Cognitive deficits  OT Problem List: Decreased strength;Decreased activity tolerance;Impaired balance (sitting and/or standing);Decreased knowledge of use of DME or AE;Decreased safety awareness;Decreased  cognition;Pain OT Treatment Interventions: Self-care/ADL training;Therapeutic exercise;DME and/or AE instruction;Cognitive remediation/compensation;Patient/family education;Balance training   OT Goals Acute Rehab OT Goals OT Goal Formulation: With patient/family Time For Goal Achievement: 04/06/12 Potential to Achieve Goals: Good ADL Goals Pt Will Perform Grooming: Standing at sink;with min assist ADL Goal: Grooming - Progress: Goal set today Pt Will Perform Upper Body Bathing: Sitting at sink;with min assist ADL Goal: Upper Body Bathing - Progress: Goal set today Pt Will Perform Lower Body Bathing: Sit to stand from chair;with min assist ADL Goal: Lower Body Bathing - Progress: Goal set today Pt Will Perform Upper Body Dressing: Sitting, chair;Sitting, bed;with min assist ADL Goal: Upper Body Dressing - Progress: Goal set today Pt Will Perform Lower Body Dressing: Sit to stand from chair;Sit to stand from bed;with min assist ADL Goal: Lower Body Dressing - Progress: Goal set today Pt Will Transfer to Toilet: with min assist ADL Goal: Toilet Transfer - Progress: Goal set today Pt Will Perform Toileting - Clothing Manipulation: with min assist;Standing ADL Goal: Toileting - Clothing Manipulation - Progress: Goal set today Pt Will Perform Toileting - Hygiene: with min assist;Sit to stand from 3-in-1/toilet ADL Goal: Toileting - Hygiene - Progress: Goal set today  Visit Information  Last OT Received On: 03/23/12 Assistance Needed: +2 PT/OT Co-Evaluation/Treatment: Yes    Subjective Data  Subjective: "There's a snake."    Prior Functioning  Home Living Lives With: Spouse Available Help at Discharge: Family;Available 24 hours/day Type of Home: House Home Access: Stairs to enter [employer] of Steps: 1, 1 Entrance Stairs-Rails: None Home Layout: Multi-level;Able to live on main level with bedroom/bathroom Bathroom Shower/Tub: Health visitor:  Handicapped height Bathroom Accessibility: Yes How Accessible: Accessible via walker  Home Adaptive Equipment: Walker - rolling;Bedside commode/3-in-1 (built-in shower seat) Prior Function Level of Independence: Independent Able to Take Stairs?: Yes Driving: No Vocation: Retired Musician: No difficulties Dominant Hand: Right    Cognition  Overall Cognitive Status: History of cognitive impairments - further impaired Area of Impairment: Attention;Memory;Following commands;Safety/judgement;Awareness of errors;Awareness of deficits;Problem solving;Executive functioning Arousal/Alertness: Awake/alert Orientation Level: Disoriented to;Place;Time;Situation Behavior During Session: Restless Current Attention Level: Focused Following Commands: Follows one step commands inconsistently Safety/Judgement: Decreased safety judgement for tasks assessed;Impulsive;Decreased awareness of need for assistance Awareness of Errors: Assistance required to identify errors made;Assistance required to correct errors made Awareness of Deficits: unaware of need for assistance or weakness    Extremity/Trunk Assessment Right Upper Extremity Assessment RUE ROM/Strength/Tone: [facility] for tasks assessed RUE Coordination: Deficits RUE Coordination Deficits: Pt. overshooting when bringing food to mouth and retreiving food. When asked to touch head, pt. missed head.  Left Upper Extremity Assessment LUE ROM/Strength/Tone: WFL for tasks assessed LUE Coordination: Deficits LUE Coordination Deficits: Pt. overshooting when asked to touch head.    Mobility Bed Mobility Bed Mobility: Not assessed Transfers Transfers: Sit to Stand;Stand to Sit Sit to Stand: 1: +2 Total assist;With upper extremity assist;From chair/3-in-1 Sit to Stand: Patient Percentage: 20% Stand to Sit: 1: +2 Total assist;To chair/3-in-1;With upper extremity assist Stand to Sit: Patient Percentage: 30% Details for Transfer Assistance:  VC for hand placement. Pt required increased time to complete as he was initially resisting standing. Max cueing for proper sequencing and safety.         End of Session OT - End of Session Patient left: in chair;with call bell/phone within reach;with family/visitor present  GO     Jenell Milliner 03/23/2012, 9:55 AM

## 2012-03-23 NOTE — Progress Notes (Signed)
Note reviewed and revisions made   Lucile Shutters   OTR/L Pager: [phone] Office: [phone] .

## 2012-03-23 NOTE — Progress Notes (Addendum)
Pt to be TX to 6N, bed 5, VSS.  PCA wasted 12ml. Witness: Tereasa Coop, RN.  Called report and TX Pt.

## 2012-03-23 NOTE — Progress Notes (Signed)
I have seen and examined the patient and agree with the assessment and plans.  Rual Vermeer A. Alissandra Geoffroy  MD, FACS  

## 2012-03-24 DIAGNOSIS — I10 Essential (primary) hypertension: Secondary | ICD-10-CM

## 2012-03-24 DIAGNOSIS — G4730 Sleep apnea, unspecified: Secondary | ICD-10-CM

## 2012-03-24 DIAGNOSIS — J189 Pneumonia, unspecified organism: Secondary | ICD-10-CM

## 2012-03-24 LAB — BASIC METABOLIC PANEL
CO2: 23 mEq/L (ref 19–32)
Calcium: 7.8 mg/dL — ABNORMAL LOW (ref 8.4–10.5)
Creatinine, Ser: 0.8 mg/dL (ref 0.50–1.35)
Glucose, Bld: 119 mg/dL — ABNORMAL HIGH (ref 70–99)
Sodium: 145 mEq/L (ref 135–145)

## 2012-03-24 MED ORDER — TRIAMTERENE-HCTZ 37.5-25 MG PO TABS
0.5000 | ORAL_TABLET | Freq: Every day | ORAL | Status: DC
  Administered 2012-03-24 – 2012-03-28 (×5): 0.5 via ORAL
  Filled 2012-03-24 (×5): qty 0.5

## 2012-03-24 MED ORDER — HALOPERIDOL LACTATE 5 MG/ML IJ SOLN
5.0000 mg | Freq: Four times a day (QID) | INTRAMUSCULAR | Status: DC | PRN
  Administered 2012-03-25: 2.5 mg via INTRAVENOUS
  Filled 2012-03-24 (×2): qty 1

## 2012-03-24 MED ORDER — LORAZEPAM 2 MG/ML IJ SOLN
INTRAMUSCULAR | Status: AC
  Administered 2012-03-24: 0.5 mg via INTRAVENOUS
  Filled 2012-03-24: qty 1

## 2012-03-24 MED ORDER — DONEPEZIL HCL 5 MG PO TABS
5.0000 mg | ORAL_TABLET | Freq: Every day | ORAL | Status: DC
  Administered 2012-03-24 – 2012-03-27 (×4): 5 mg via ORAL
  Filled 2012-03-24 (×5): qty 1

## 2012-03-24 MED ORDER — POTASSIUM CHLORIDE CRYS ER 20 MEQ PO TBCR
40.0000 meq | EXTENDED_RELEASE_TABLET | Freq: Two times a day (BID) | ORAL | Status: AC
  Administered 2012-03-24 (×2): 40 meq via ORAL
  Filled 2012-03-24 (×2): qty 2

## 2012-03-24 MED ORDER — LORAZEPAM 2 MG/ML IJ SOLN
0.5000 mg | INTRAMUSCULAR | Status: DC | PRN
  Administered 2012-03-24: 0.5 mg via INTRAVENOUS

## 2012-03-24 NOTE — Progress Notes (Signed)
Patient ID: John Summers, male   DOB: 1932-12-05, 78 y.o.   MRN: [id number]  3 Days Post-Op  Subjective:  Pt sleeping, son in room reports its the first time he has slept in days, was very agitated last night and confused, was given ativan and dilaudid which did not help.  Son reports that he does not complain of pain, +BMs and flatus.   Objective: Vital signs in last 24 hours: Temp:  [97.7 F (36.5 C)-99.6 F (37.6 C)] 99.6 F (37.6 C) (07/11 0530) Pulse Rate:  [73-92] 73  (07/11 0530) Resp:  [18-23] 18  (07/11 0530) BP: (140-174)/(81-108) 157/81 mmHg (07/11 0530) SpO2:  [95 %-100 %] 100 % (07/11 0530) Last BM Date: 03/23/12  Intake/Output from previous day: 07/10 0701 - 07/11 0700 In: 1705 [I.V.:1705] Out: 1651 [Urine:1650; Stool:1] Intake/Output this shift:    PE: Abdomen: soft, non distended, dressing dry, Staples intact, no drainage or erythema noted, +BS  Lab Results:   [facility] 03/23/12 0318 03/22/12 0525  WBC 5.2 5.2  HGB 10.9* 10.8*  HCT 31.7* 31.8*  PLT 124* 100*   BMET  Basename 03/24/12 0715 03/23/12 0318  NA 145 148*  K 3.1* 3.5  CL 112 113*  CO2 23 22  GLUCOSE 119* 110*  BUN 6 6  CREATININE 0.80 0.91  CALCIUM 7.8* 8.1*   PT/INR No results found for this basename: LABPROT:2,INR:2 in the last 72 hours Comprehensive Metabolic Panel:    Component Value Date/Time   NA 145 03/24/2012 0715   K 3.1* 03/24/2012 0715   CL 112 03/24/2012 0715   CO2 23 03/24/2012 0715   BUN 6 03/24/2012 0715   CREATININE 0.80 03/24/2012 0715   GLUCOSE 119* 03/24/2012 0715   CALCIUM 7.8* 03/24/2012 0715   AST 41* 03/23/2012 0318   ALT 35 03/23/2012 0318   ALKPHOS 99 03/23/2012 0318   BILITOT 0.6 03/23/2012 0318   PROT 6.0 03/23/2012 0318   ALBUMIN 2.3* 03/23/2012 0318     Studies/Results: No results found.  Anti-infectives: Anti-infectives     Start     Dose/Rate Route Frequency Ordered Stop   03/20/12 1800   levofloxacin (LEVAQUIN) IVPB 750 mg     Comments:  Please ask pharmacy to does for crcl      750 mg 100 mL/hr over 90 Minutes Intravenous  Once 03/19/12 1851 03/20/12 1855   03/19/12 2000   vancomycin (VANCOCIN) 1,250 mg in sodium chloride 0.9 % 250 mL IVPB        1,250 mg 166.7 mL/hr over 90 Minutes Intravenous Every 12 hours 03/19/12 1907 03/26/12 1959   03/19/12 2000   piperacillin-tazobactam (ZOSYN) IVPB 3.375 g        3.375 g 12.5 mL/hr over 240 Minutes Intravenous Every 8 hours 03/19/12 1907 03/26/12 1959   03/19/12 1715   levofloxacin (LEVAQUIN) IVPB 750 mg        750 mg 100 mL/hr over 90 Minutes Intravenous  Once 03/19/12 1705 03/19/12 1951          Assessment Active Problems:  Adenocarcinoma Of Cecum  Abdominal pain, acute  HCAP (healthcare-associated pneumonia)  GERD (gastroesophageal reflux disease) POD#3-s/p lap assisted right partial colectomy: biggest issue at current is agitation and confusion.  Seems to be sun downing, will add prn haldol to help, pain control seems good and bowel function returning, will advance diet to fulls today.    LOS: 5 days     Arlind Klingerman 03/24/2012

## 2012-03-24 NOTE — Progress Notes (Signed)
Clinical Social Work  Per [employer], patient is not eligible for CIR. CSW followed up with bed offers for family. At this time, no offers from original fax out. [facility] and [facility]	[place] were unable to offer a bed. CSW followed up with [facility] and [place] since they have not responded in [facility]. CSW left a message at [facility] with admissions coordinator. CSW spoke with [facility] admission coordinator who reported she is reviewing information and will call CSW after she has made a decision. CSW will continue to follow.  [place], [place] [phone]

## 2012-03-24 NOTE — Progress Notes (Signed)
[facility]   PCP:  Hannah Beat, MD  Subjective: 78 y.o. 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 He came in with worsening right lower quadrant pain, and was found to have focal perforation of the cecum, s/p right colectomy on 03/21/12 by Dr Magnus Ivan. He also has an infiltrate incidentally noted on a CT scan of the abdomen.consistent with pneumonia.  His postoperative course has been complicated by acute delirium.  It is noted in his old records that he has a history of sundowning.  At the present time he is alert but confused and appears to be hallucinating.  He is unable to provide a reliable history as a result.  Objective:  Intake/Output Summary (Last 24 hours) at 03/24/12 1139 Last data filed at 03/24/12 0616  Gross per 24 hour  Intake   1705 ml  Output   1651 ml  Net     54 ml   Blood pressure 157/81, pulse 73, temperature 99.6 F (37.6 C), temperature source Axillary, resp. rate 18, height 5\' 10"  (1.778 m), weight 100.699 kg (222 lb), SpO2 97.00%.  CBG (last 3)   Basename 03/23/12 0555  GLUCAP 124*   Physical Exam: General: No acute respiratory distress - sleeping comfortably Lungs: Clear to auscultation bilaterally without wheezes or crackles Cardiovascular: Regular rate and rhythm without murmur gallop or rub  Abdomen: Mildly protuberant, nontender, nondistended, soft, bowel sounds hypoactive but positive, no rebound, no ascites, no appreciable mass Extremities: No significant cyanosis, clubbing, or edema bilateral lower extremities  Lab Results:  Basename 03/24/12 0715 03/23/12 0318 03/22/12 0525  NA 145 148* 140  K 3.1* 3.5 3.7  CL 112 113* 107  CO2 23 22 22   GLUCOSE 119* 110* 103*  BUN 6 6 6   CREATININE 0.80 0.91 0.98  CALCIUM 7.8* 8.1* 8.0*  MG -- -- --  PHOS -- -- --    Basename 03/23/12 0318  AST 41*  ALT 35  ALKPHOS 99  BILITOT 0.6  PROT 6.0  ALBUMIN 2.3*    Basename 03/23/12 0318 03/22/12 0525  WBC 5.2 5.2  NEUTROABS  -- --  HGB 10.9* 10.8*  HCT 31.7* 31.8*  MCV 95.5 97.0  PLT 124* 100*   Micro Results: Recent Results (from the past 240 hour(s))  CULTURE, BLOOD (ROUTINE X 2)     Status: Normal (Preliminary result)   Collection Time   03/19/12  5:50 PM      Component Value Range Status Comment   Specimen Description BLOOD RIGHT HAND   Final    Special Requests BOTTLES DRAWN AEROBIC AND ANAEROBIC 10CC EA   Final    Culture  Setup Time 03/20/2012 02:12   Final    Culture     Final    Value:        BLOOD CULTURE RECEIVED NO GROWTH TO DATE CULTURE WILL BE HELD FOR 5 DAYS BEFORE ISSUING A FINAL NEGATIVE REPORT   Report Status PENDING   Incomplete   CULTURE, BLOOD (ROUTINE X 2)     Status: Normal (Preliminary result)   Collection Time   03/19/12  6:00 PM      Component Value Range Status Comment   Specimen Description BLOOD LEFT HAND   Final    Special Requests BOTTLES DRAWN AEROBIC AND ANAEROBIC 10CC EA   Final    Culture  Setup Time 03/20/2012 02:12   Final    Culture     Final    Value:  BLOOD CULTURE RECEIVED NO GROWTH TO DATE CULTURE WILL BE HELD FOR 5 DAYS BEFORE ISSUING A FINAL NEGATIVE REPORT   Report Status PENDING   Incomplete   URINE CULTURE     Status: Normal   Collection Time   03/19/12  6:53 PM      Component Value Range Status Comment   Specimen Description URINE, CLEAN CATCH   Final    Special Requests NONE   Final    Culture  Setup Time 03/20/2012 02:11   Final    Colony Count 35,000 COLONIES/ML   Final    Culture     Final    Value: Multiple bacterial morphotypes present, none predominant. Suggest appropriate recollection if clinically indicated.   Report Status 03/20/2012 FINAL   Final   SURGICAL PCR SCREEN     Status: Normal   Collection Time   03/20/12 10:11 PM      Component Value Range Status Comment   MRSA, PCR NEGATIVE  NEGATIVE Final    Staphylococcus aureus NEGATIVE  NEGATIVE Final     Studies/Results: All recent x-ray/radiology reports have been reviewed in  detail.   Medications: I have reviewed the patient's complete medication list.  Assessment/Plan:  Adenocarcinoma of the cecum Status post right partial colectomy - General Surgery is following  Healthcare acquired pneumonia Remains on empiric antibiotic therapy - will complete a seven day course  Acute delirium in the setting of Dementia at baseline patient has a known history of sundowning noted during previous hospital stays - we will avoid all other sedating medications, especially benzodiazepines and sedating antihistamines - we will initiate nightly Seroquel - we will transfer to a medical unit as soon as possible  Sleep apnea An attempt to resume CPAP in the setting of acute delirium would be ill advised - if the patient's sundowning resolves, resumption of his home CPAP regimen could then be considered  Gastroesophageal reflux disease Appears to be well-controlled at present  Hypertension Not well controlled. We will start his home medication tonight for better BP control.   Hypokalemia: replete as needed.  Dementia: will restart his Aricept.    [facility]  [facility]  Pager [phone]  If 7PM-7AM, please contact night-coverage  [email or web address]  Password [email or web address]

## 2012-03-24 NOTE — Progress Notes (Signed)
Clinical Social Work  [facility] called CSW and reported they could not offer a bed. CSW met with wife at bedside to discuss options. CSW explained that [facility] is still reviewing and suggested expanding search for alternative options. Wife agreeable to county wide search in [place] and [place]. CSW will follow up with additional bed offers.  [place], [place] [phone]

## 2012-03-24 NOTE — Progress Notes (Signed)
I have seen and examined the patient and agree with the assessment and plans.  Viviana Trimble A. Demichael Traum  MD, FACS  

## 2012-03-25 LAB — CBC
HCT: 30.3 % — ABNORMAL LOW (ref 39.0–52.0)
Hemoglobin: 10.6 g/dL — ABNORMAL LOW (ref 13.0–17.0)
MCHC: 35 g/dL (ref 30.0–36.0)
MCV: 94.4 fL (ref 78.0–100.0)
WBC: 4.1 10*3/uL (ref 4.0–10.5)

## 2012-03-25 LAB — BASIC METABOLIC PANEL
BUN: 7 mg/dL (ref 6–23)
Chloride: 109 mEq/L (ref 96–112)
Creatinine, Ser: 0.94 mg/dL (ref 0.50–1.35)
Glucose, Bld: 123 mg/dL — ABNORMAL HIGH (ref 70–99)
Potassium: 3.3 mEq/L — ABNORMAL LOW (ref 3.5–5.1)

## 2012-03-25 MED ORDER — POTASSIUM CHLORIDE CRYS ER 20 MEQ PO TBCR
40.0000 meq | EXTENDED_RELEASE_TABLET | Freq: Two times a day (BID) | ORAL | Status: AC
  Administered 2012-03-25 (×2): 40 meq via ORAL
  Filled 2012-03-25 (×2): qty 2

## 2012-03-25 NOTE — Progress Notes (Signed)
4 Days Post-Op  Subjective: POD#4 Mental status slowly improving.  Having BM's  Objective: Vital signs in last 24 hours: Temp:  [97.7 F (36.5 C)-99.5 F (37.5 C)] 99 F (37.2 C) (07/12 0606) Pulse Rate:  [73-82] 82  (07/12 0606) Resp:  [18-29] 29  (07/12 0606) BP: (140-174)/(75-95) 164/93 mmHg (07/12 0606) SpO2:  [91 %-100 %] 100 % (07/12 0606) Last BM Date: 03/23/12  Intake/Output from previous day: 07/11 0701 - 07/12 0700 In: 1248.8 [I.V.:1083.8; IV Piggyback:165] Out: 1600 [Urine:1600] Intake/Output this shift:    Abdomen soft, non distended, incision clean  Lab Results:   [facility] 03/23/12 0318  WBC 5.2  HGB 10.9*  HCT 31.7*  PLT 124*   BMET  Basename 03/24/12 0715 03/23/12 0318  NA 145 148*  K 3.1* 3.5  CL 112 113*  CO2 23 22  GLUCOSE 119* 110*  BUN 6 6  CREATININE 0.80 0.91  CALCIUM 7.8* 8.1*   PT/INR No results found for this basename: LABPROT:2,INR:2 in the last 72 hours ABG No results found for this basename: PHART:2,PCO2:2,PO2:2,HCO3:2 in the last 72 hours  Studies/Results: No results found.  Anti-infectives: Anti-infectives     Start     Dose/Rate Route Frequency Ordered Stop   03/20/12 1800   levofloxacin (LEVAQUIN) IVPB 750 mg     Comments: Please ask pharmacy to does for crcl      750 mg 100 mL/hr over 90 Minutes Intravenous  Once 03/19/12 1851 03/20/12 1855   03/19/12 2000   vancomycin (VANCOCIN) 1,250 mg in sodium chloride 0.9 % 250 mL IVPB        1,250 mg 166.7 mL/hr over 90 Minutes Intravenous Every 12 hours 03/19/12 1907 03/26/12 1959   03/19/12 2000   piperacillin-tazobactam (ZOSYN) IVPB 3.375 g        3.375 g 12.5 mL/hr over 240 Minutes Intravenous Every 8 hours 03/19/12 1907 03/26/12 1959   03/19/12 1715   levofloxacin (LEVAQUIN) IVPB 750 mg        750 mg 100 mL/hr over 90 Minutes Intravenous  Once 03/19/12 1705 03/19/12 1951          Assessment/Plan: s/p Procedure(s) (LRB): LAPAROSCOPIC RIGHT COLECTOMY  (Right) PARTIAL COLECTOMY (Right)  Will keep on full liquids until tomorrow Continue PT/OT May need short term placement  LOS: 6 days    Chevelle Durr A 03/25/2012

## 2012-03-25 NOTE — Progress Notes (Signed)
Clinical Social Worker met with pt and pt family at bedside to discuss SNF options. Updated pt and pt family that [facility]	[place] was unable to offer a bed and [address] is still reviewing information and provided the bed offers for [facility] [address] [facility]. Clinical Social Worker attempted to contact [employer] at family request and was unable to reach someone or a voicemail to leave a message. Clinical Social Worker encouraged pt, pt wife, and pt family to consider the facilities that have offered a bed. Clinical Social Worker to continue to follow and facilitate pt discharge needs when pt medically stable for discharge.  Jacklynn Lewis, MSW, LCSWA  Clinical Social Work [phone]

## 2012-03-25 NOTE — Progress Notes (Signed)
Physical Therapy Treatment Patient Details Name: John Summers MRN: [id number] DOB: 03-24-1933 Today's Date: 03/25/2012 Time: 1004-1030 PT Time Calculation (min): 26 min  PT Assessment / Plan / Recommendation Comments on Treatment Session  Cognition much improved. Progressing with mobility but limited by pain and generalized weakness/stiffness today. Encouraged family to have pt do ankle pumps and heel slides with their help and to ambulate another time today with nursing staff.     Follow Up Recommendations  Skilled nursing facility    Barriers to Discharge        Equipment Recommendations  Defer to next venue    Recommendations for Other Services    Frequency Min 3X/week   Plan Discharge plan remains appropriate;Frequency remains appropriate    Precautions / Restrictions Precautions Precautions: Fall       Mobility  Bed Mobility Bed Mobility: Rolling Left;Left Sidelying to Sit Rolling Left: 1: +2 Total assist Right Sidelying to Sit: Patient Percentage: 70% Left Sidelying to Sit: 1: +2 Total assist Left Sidelying to Sit: Patient Percentage: 60% Details for Bed Mobility Assistance: facilitatory and verbal cues to sequence rolling and bring trunk upright Transfers Transfers: Sit to Stand;Stand to Sit Sit to Stand: 1: +2 Total assist;From bed;With upper extremity assist Sit to Stand: Patient Percentage: 70% Stand to Sit: 3: Mod assist;With armrests;To chair/3-in-1;With upper extremity assist Details for Transfer Assistance: facilitation for anterior/superior translation of trunk over BOS and follow through to stand, slow to rise and extend trunk secondary to abdominal incision discomfort Ambulation/Gait Ambulation/Gait Assistance: 3: Mod assist;4: Min assist Ambulation Distance (Feet): 15 Feet Assistive device: Rolling walker Ambulation/Gait Assistance Details: cues for safe use of RW, upright posture and encouragement for increased distance (chair follow)  Gait  Pattern: Trunk flexed;Decreased step length - left;Decreased step length - right    Exercises      PT Goals Acute Rehab PT Goals PT Goal: Supine/Side to Sit - Progress: Progressing toward goal PT Goal: Sit to Stand - Progress: Progressing toward goal PT Goal: Stand to Sit - Progress: Progressing toward goal Pt will Ambulate: 51 - 150 feet;with least restrictive assistive device;with supervision PT Goal: Ambulate - Progress: Updated due to goal met PT Goal: Up/Down Stairs - Progress: Discontinued (comment)  Visit Information  Last PT Received On: 03/25/12 Assistance Needed: +2    Subjective Data  Subjective: Oh no. (when told physical therapy was there)   Cognition  Overall Cognitive Status: Impaired Area of Impairment: Attention Arousal/Alertness: Lethargic Orientation Level: Oriented X4 / Intact Current Attention Level: Sustained;Selective Attention - Other Comments: needed continuous cueing to keepy eyes open and focus during session Cognition - Other Comments: slower processing today and mild confusion, very emotional today getting upset twice    Balance  Static Standing Balance Static Standing - Balance Support: Bilateral upper extremity supported Static Standing - Level of Assistance: 4: Min assist  End of Session PT - End of Session Equipment Utilized During Treatment: Gait belt Activity Tolerance: Patient tolerated treatment well Patient left: in chair;with call bell/phone within reach;with family/visitor present Nurse Communication: Mobility status;Patient requests pain meds   GP     [facility] HELEN 03/25/2012, 12:56 PM

## 2012-03-25 NOTE — Progress Notes (Signed)
[facility]   PCP:  Hannah Beat, MD  Subjective: 78 y.o. 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 He came in with worsening right lower quadrant pain, and was found to have focal perforation of the cecum, s/p right colectomy on 03/21/12 by Dr Magnus Ivan. He also has an infiltrate incidentally noted on a CT scan of the abdomen.consistent with pneumonia.  His postoperative course has been complicated by acute delirium.  It is noted in his old records that he has a history of sundowning.  At the present time he is alert but confused and appears to be hallucinating.  He is unable to provide a reliable history as a result.  Objective:  Intake/Output Summary (Last 24 hours) at 03/25/12 1942 Last data filed at 03/25/12 1739  Gross per 24 hour  Intake 1848.75 ml  Output   2802 ml  Net -953.25 ml   Blood pressure 125/71, pulse 80, temperature 97.4 F (36.3 C), temperature source Oral, resp. rate 20, height 5\' 10"  (1.778 m), weight 104.418 kg (230 lb 3.2 oz), SpO2 95.00%.  CBG (last 3)   Basename 03/23/12 0555  GLUCAP 124*   Physical Exam: General: No acute respiratory distress - alert afebrile  Comfortable and oriented.  Lungs: Clear to auscultation bilaterally without wheezes or crackles Cardiovascular: Regular rate and rhythm without murmur gallop or rub  Abdomen: Mildly protuberant, nontender, nondistended, soft, bowel sounds hypoactive but positive, no rebound, no ascites, no appreciable mass Extremities: No significant cyanosis, clubbing, or edema bilateral lower extremities  Lab Results:  Basename 03/25/12 0749 03/24/12 0715 03/23/12 0318  NA 142 145 148*  K 3.3* 3.1* 3.5  CL 109 112 113*  CO2 24 23 22   GLUCOSE 123* 119* 110*  BUN 7 6 6   CREATININE 0.94 0.80 0.91  CALCIUM 8.0* 7.8* 8.1*  MG 1.9 -- --  PHOS -- -- --    Basename 03/23/12 0318  AST 41*  ALT 35  ALKPHOS 99  BILITOT 0.6  PROT 6.0  ALBUMIN 2.3*    Basename 03/25/12 0749 03/23/12  0318  WBC 4.1 5.2  NEUTROABS -- --  HGB 10.6* 10.9*  HCT 30.3* 31.7*  MCV 94.4 95.5  PLT 123* 124*   Micro Results: Recent Results (from the past 240 hour(s))  CULTURE, BLOOD (ROUTINE X 2)     Status: Normal (Preliminary result)   Collection Time   03/19/12  5:50 PM      Component Value Range Status Comment   Specimen Description BLOOD RIGHT HAND   Final    Special Requests BOTTLES DRAWN AEROBIC AND ANAEROBIC 10CC EA   Final    Culture  Setup Time 03/20/2012 02:12   Final    Culture     Final    Value:        BLOOD CULTURE RECEIVED NO GROWTH TO DATE CULTURE WILL BE HELD FOR 5 DAYS BEFORE ISSUING A FINAL NEGATIVE REPORT   Report Status PENDING   Incomplete   CULTURE, BLOOD (ROUTINE X 2)     Status: Normal (Preliminary result)   Collection Time   03/19/12  6:00 PM      Component Value Range Status Comment   Specimen Description BLOOD LEFT HAND   Final    Special Requests BOTTLES DRAWN AEROBIC AND ANAEROBIC 10CC EA   Final    Culture  Setup Time 03/20/2012 02:12   Final    Culture     Final    Value:  BLOOD CULTURE RECEIVED NO GROWTH TO DATE CULTURE WILL BE HELD FOR 5 DAYS BEFORE ISSUING A FINAL NEGATIVE REPORT   Report Status PENDING   Incomplete   URINE CULTURE     Status: Normal   Collection Time   03/19/12  6:53 PM      Component Value Range Status Comment   Specimen Description URINE, CLEAN CATCH   Final    Special Requests NONE   Final    Culture  Setup Time 03/20/2012 02:11   Final    Colony Count 35,000 COLONIES/ML   Final    Culture     Final    Value: Multiple bacterial morphotypes present, none predominant. Suggest appropriate recollection if clinically indicated.   Report Status 03/20/2012 FINAL   Final   SURGICAL PCR SCREEN     Status: Normal   Collection Time   03/20/12 10:11 PM      Component Value Range Status Comment   MRSA, PCR NEGATIVE  NEGATIVE Final    Staphylococcus aureus NEGATIVE  NEGATIVE Final     Studies/Results: All recent x-ray/radiology  reports have been reviewed in detail.   Medications: I have reviewed the patient's complete medication list.  Assessment/Plan:  Adenocarcinoma of the cecum Status post right partial colectomy - General Surgery is following. Remains on full liquid diet.  Path report consistent with adenoca of the colon. Further management as per oncology as outpatient.   Healthcare acquired pneumonia Remains on empiric antibiotic therapy - will complete a seven day course  Acute delirium in the setting of Dementia at baseline patient has a known history of sundowning noted during previous hospital stays - we will avoid all other sedating medications, especially benzodiazepines and sedating antihistamines - we will initiate nightly Seroquel -  Sleep apnea An attempt to resume CPAP in the setting of acute delirium would be ill advised - if the patient's sundowning resolves, resumption of his home CPAP regimen could then be considered  Gastroesophageal reflux disease Appears to be well-controlled at present  Hypertension Not well controlled. We will start his home medication tonight for better BP control.   Hypokalemia: replete as needed.  Dementia: will restart his Aricept.    [facility]  [facility]  Pager [phone]  If 7PM-7AM, please contact night-coverage  [email or web address]  Password [email or web address]

## 2012-03-25 NOTE — Plan of Care (Signed)
This is a 78 year old gentleman. His pathology has come back. He has a T3 N0 adenocarcinoma of the colon. This is a stage II tumor. He is unlikely to receive adjuvant therapy. He sees oncologist and aliments and I would recommend that he have followup with him, Dr. Orlie Dakin.  Pierce Crane M.D. FRCP C. 03/25/2012

## 2012-03-25 NOTE — Progress Notes (Signed)
UR complete 

## 2012-03-26 LAB — CULTURE, BLOOD (ROUTINE X 2)
Culture: NO GROWTH
Culture: NO GROWTH

## 2012-03-26 LAB — BASIC METABOLIC PANEL
BUN: 8 mg/dL (ref 6–23)
Chloride: 114 mEq/L — ABNORMAL HIGH (ref 96–112)
Creatinine, Ser: 1.01 mg/dL (ref 0.50–1.35)
Glucose, Bld: 118 mg/dL — ABNORMAL HIGH (ref 70–99)
Potassium: 3.8 mEq/L (ref 3.5–5.1)

## 2012-03-26 MED ORDER — TETRAHYDROZOLINE HCL 0.05 % OP SOLN
2.0000 [drp] | Freq: Two times a day (BID) | OPHTHALMIC | Status: DC
  Administered 2012-03-26 – 2012-03-28 (×5): 2 [drp] via OPHTHALMIC
  Filled 2012-03-26: qty 15

## 2012-03-26 MED ORDER — VANCOMYCIN HCL 1000 MG IV SOLR
1250.0000 mg | Freq: Two times a day (BID) | INTRAVENOUS | Status: DC
  Filled 2012-03-26 (×2): qty 1250

## 2012-03-26 MED ORDER — VANCOMYCIN HCL 1000 MG IV SOLR
1250.0000 mg | Freq: Two times a day (BID) | INTRAVENOUS | Status: DC
  Administered 2012-03-26 – 2012-03-27 (×3): 1250 mg via INTRAVENOUS
  Filled 2012-03-26 (×3): qty 1250

## 2012-03-26 MED ORDER — PIPERACILLIN-TAZOBACTAM 3.375 G IVPB
3.3750 g | Freq: Three times a day (TID) | INTRAVENOUS | Status: DC
  Administered 2012-03-26 – 2012-03-28 (×5): 3.375 g via INTRAVENOUS
  Filled 2012-03-26 (×7): qty 50

## 2012-03-26 MED ORDER — PANTOPRAZOLE SODIUM 40 MG PO TBEC
40.0000 mg | DELAYED_RELEASE_TABLET | Freq: Every day | ORAL | Status: DC
  Administered 2012-03-26 – 2012-03-28 (×3): 40 mg via ORAL
  Filled 2012-03-26 (×3): qty 1

## 2012-03-26 NOTE — Progress Notes (Signed)
Patient ID: John Summers, male   DOB: [birth year], 78 y.o.   MRN: [id number]  General Surgery - [facility] - Progress Note  POD# 5  Subjective: Patient without complaints.  Having loose BM's.  Family at bedside.  On full liquid diet - wants regular food.  Objective: Vital signs in last 24 hours: Temp:  [97.4 F (36.3 C)-98.8 F (37.1 C)] 98.8 F (37.1 C) (07/13 0508) Pulse Rate:  [69-80] 70  (07/13 0508) Resp:  [16-20] 16  (07/13 0508) BP: (115-148)/(71-86) 148/86 mmHg (07/13 0508) SpO2:  [95 %-100 %] 96 % (07/13 0508) Weight:  [230 lb 3.2 oz (104.418 kg)] 230 lb 3.2 oz (104.418 kg) (07/12 1356) Last BM Date: 03/23/12  Intake/Output from previous day: 07/12 0701 - 07/13 0700 In: 2103.8 [P.O.:600; I.V.:1503.8] Out: 1927 [Urine:1925; Stool:2]  Exam: HEENT - clear, not icteric Neck - soft Chest - clear bilaterally Cor - RRR, no murmur Abd - soft, wound with small serous drainage at umbilicus; BS present Ext - no significant edema Neuro - grossly intact, no focal deficits  Lab Results:   [facility] 03/25/12 0749  WBC 4.1  HGB 10.6*  HCT 30.3*  PLT 123*     Basename 03/26/12 0640 03/25/12 0749  NA 145 142  K 3.8 3.3*  CL 114* 109  CO2 24 24  GLUCOSE 118* 123*  BUN 8 7  CREATININE 1.01 0.94  CALCIUM 8.2* 8.0*    Studies/Results: No results found.  Assessment / Plan: 1.  Status post right colectomy for adenocarcinoma  - advance diet to regular  - dressing changes to wound BID  - OOB, ambulate with P.T.  - may shower  - continue IV abx until Monday per Dr. Lilly Cove, MD, [facility] Office: [phone]  03/26/2012

## 2012-03-26 NOTE — Progress Notes (Signed)
[facility]   PCP:  Hannah Beat, MD  Subjective: 78 y.o. 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 He came in with worsening right lower quadrant pain, and was found to have focal perforation of the cecum, s/p right colectomy on 03/21/12 by Dr Magnus Ivan. He also has an infiltrate incidentally noted on a CT scan of the abdomen.consistent with pneumonia.  His postoperative course has been complicated by acute delirium.  It is noted in his old records that he has a history of sundowning.  At the present time he is alert but confused and appears to be hallucinating.  He is unable to provide a reliable history as a result.  Objective:  Intake/Output Summary (Last 24 hours) at 03/26/12 1725 Last data filed at 03/26/12 1500  Gross per 24 hour  Intake 4089.58 ml  Output   3152 ml  Net 937.58 ml   Blood pressure 145/84, pulse 75, temperature 97 F (36.1 C), temperature source Oral, resp. rate 17, height 5\' 10"  (1.778 m), weight 104.418 kg (230 lb 3.2 oz), SpO2 96.00%.  CBG (last 3)  No results found for this basename: GLUCAP:3 in the last 72 hours Physical Exam: General: No acute respiratory distress - alert afebrile  Comfortable and oriented.  Lungs: Clear to auscultation bilaterally without wheezes or crackles Cardiovascular: Regular rate and rhythm without murmur gallop or rub  Abdomen: Mildly protuberant, nontender, nondistended, soft, bowel sounds hypoactive but positive, no rebound, no ascites, no appreciable mass Extremities: No significant cyanosis, clubbing, or edema bilateral lower extremities  Lab Results:  Basename 03/26/12 0640 03/25/12 0749 03/24/12 0715  NA 145 142 145  K 3.8 3.3* 3.1*  CL 114* 109 112  CO2 24 24 23   GLUCOSE 118* 123* 119*  BUN 8 7 6   CREATININE 1.01 0.94 0.80  CALCIUM 8.2* 8.0* 7.8*  MG -- 1.9 --  PHOS -- -- --   No results found for this basename: AST:2,ALT:2,ALKPHOS:2,BILITOT:2,PROT:2,ALBUMIN:2 in the last 72  hours  Basename 03/25/12 0749  WBC 4.1  NEUTROABS --  HGB 10.6*  HCT 30.3*  MCV 94.4  PLT 123*   Micro Results: Recent Results (from the past 240 hour(s))  CULTURE, BLOOD (ROUTINE X 2)     Status: Normal   Collection Time   03/19/12  5:50 PM      Component Value Range Status Comment   Specimen Description BLOOD RIGHT HAND   Final    Special Requests BOTTLES DRAWN AEROBIC AND ANAEROBIC 10CC EA   Final    Culture  Setup Time 03/20/2012 02:12   Final    Culture NO GROWTH 5 DAYS   Final    Report Status 03/26/2012 FINAL   Final   CULTURE, BLOOD (ROUTINE X 2)     Status: Normal   Collection Time   03/19/12  6:00 PM      Component Value Range Status Comment   Specimen Description BLOOD LEFT HAND   Final    Special Requests BOTTLES DRAWN AEROBIC AND ANAEROBIC 10CC EA   Final    Culture  Setup Time 03/20/2012 02:12   Final    Culture NO GROWTH 5 DAYS   Final    Report Status 03/26/2012 FINAL   Final   URINE CULTURE     Status: Normal   Collection Time   03/19/12  6:53 PM      Component Value Range Status Comment   Specimen Description URINE, CLEAN CATCH   Final    Special Requests NONE  Final    Culture  Setup Time 03/20/2012 02:11   Final    Colony Count 35,000 COLONIES/ML   Final    Culture     Final    Value: Multiple bacterial morphotypes present, none predominant. Suggest appropriate recollection if clinically indicated.   Report Status 03/20/2012 FINAL   Final   SURGICAL PCR SCREEN     Status: Normal   Collection Time   03/20/12 10:11 PM      Component Value Range Status Comment   MRSA, PCR NEGATIVE  NEGATIVE Final    Staphylococcus aureus NEGATIVE  NEGATIVE Final     Studies/Results: All recent x-ray/radiology reports have been reviewed in detail.   Medications: I have reviewed the patient's complete medication list.  Assessment/Plan:  Adenocarcinoma of the cecum Status post right partial colectomy - General Surgery is following. Advanced diet by surgery  Path report  consistent with adenoca of the colon. Further management as per oncology as outpatient. Discussed with pt and his wife and son at bedside.  Healthcare acquired pneumonia Remains on empiric antibiotic therapy - will continue antibiotics till Monday.  Acute delirium in the setting of Dementia at baseline patient has a known history of sundowning noted during previous hospital stays - we will avoid all other sedating medications, especially benzodiazepines and sedating antihistamines - we will initiate nightly Seroquel -  Sleep apnea An attempt to resume CPAP in the setting of acute delirium would be ill advised - if the patient's sundowning resolves, resumption of his home CPAP regimen could then be considered  Gastroesophageal reflux disease Appears to be well-controlled at present  Hypertension Not well controlled. We will start his home medication tonight for better BP control.   Hypokalemia: replete as needed.  Dementia: will restart his Aricept.    [facility]  [facility]  Pager [phone]  If 7PM-7AM, please contact night-coverage  [email or web address]  Password [email or web address]

## 2012-03-26 NOTE — Progress Notes (Signed)
ANTIBIOTIC CONSULT NOTE - FOLLOW UP  Pharmacy Consult for Vancomycin/Zosyn Indication: pneumonia  Allergies  Allergen Reactions  . Chicken Allergy     unknown    Patient Measurements: Height: 5\' 10"  (177.8 cm) Weight: 230 lb 3.2 oz (104.418 kg) IBW/kg (Calculated) : 73   Vital Signs: Temp: 97 F (36.1 C) (07/13 1430) BP: 145/84 mmHg (07/13 1430) Pulse Rate: 75  (07/13 1430) Intake/Output from previous day: 07/12 0701 - 07/13 0700 In: 2753.8 [P.O.:600; I.V.:1503.8; IV Piggyback:650] Out: 1927 [Urine:1925; Stool:2] Intake/Output from this shift: Total I/O In: 1755.8 [P.O.:960; I.V.:495.8; IV Piggyback:300] Out: 1225 [Urine:1225]  Labs:  [facility] 03/26/12 0640 03/25/12 0749 03/24/12 0715  WBC -- 4.1 --  HGB -- 10.6* --  PLT -- 123* --  LABCREA -- -- --  CREATININE 1.01 0.94 0.80   Estimated Creatinine Clearance: 74.2 ml/min (by C-G formula based on Cr of 1.01).  Basename 03/23/12 1950  VANCOTROUGH 15.5  VANCOPEAK --  Drue Dun --  GENTTROUGH --  GENTPEAK --  GENTRANDOM --  TOBRATROUGH --  TOBRAPEAK --  TOBRARND --  AMIKACINPEAK --  AMIKACINTROU --  AMIKACIN --     Microbiology: Recent Results (from the past 720 hour(s))  CULTURE, BLOOD (ROUTINE X 2)     Status: Normal   Collection Time   03/19/12  5:50 PM      Component Value Range Status Comment   Specimen Description BLOOD RIGHT HAND   Final    Special Requests BOTTLES DRAWN AEROBIC AND ANAEROBIC 10CC EA   Final    Culture  Setup Time 03/20/2012 02:12   Final    Culture NO GROWTH 5 DAYS   Final    Report Status 03/26/2012 FINAL   Final   CULTURE, BLOOD (ROUTINE X 2)     Status: Normal   Collection Time   03/19/12  6:00 PM      Component Value Range Status Comment   Specimen Description BLOOD LEFT HAND   Final    Special Requests BOTTLES DRAWN AEROBIC AND ANAEROBIC 10CC EA   Final    Culture  Setup Time 03/20/2012 02:12   Final    Culture NO GROWTH 5 DAYS   Final    Report Status 03/26/2012 FINAL    Final   URINE CULTURE     Status: Normal   Collection Time   03/19/12  6:53 PM      Component Value Range Status Comment   Specimen Description URINE, CLEAN CATCH   Final    Special Requests NONE   Final    Culture  Setup Time 03/20/2012 02:11   Final    Colony Count 35,000 COLONIES/ML   Final    Culture     Final    Value: Multiple bacterial morphotypes present, none predominant. Suggest appropriate recollection if clinically indicated.   Report Status 03/20/2012 FINAL   Final   SURGICAL PCR SCREEN     Status: Normal   Collection Time   03/20/12 10:11 PM      Component Value Range Status Comment   MRSA, PCR NEGATIVE  NEGATIVE Final    Staphylococcus aureus NEGATIVE  NEGATIVE Final     Anti-infectives     Start     Dose/Rate Route Frequency Ordered Stop   03/26/12 2200   piperacillin-tazobactam (ZOSYN) IVPB 3.375 g        3.375 g 12.5 mL/hr over 240 Minutes Intravenous Every 8 hours 03/26/12 1731     03/26/12 2000   vancomycin (VANCOCIN)  1,250 mg in sodium chloride 0.9 % 250 mL IVPB  Status:  Discontinued        1,250 mg 166.7 mL/hr over 90 Minutes Intravenous Every 12 hours 03/26/12 1731 03/26/12 1732   03/26/12 2000   vancomycin (VANCOCIN) 1,250 mg in sodium chloride 0.9 % 250 mL IVPB        1,250 mg 166.7 mL/hr over 90 Minutes Intravenous Every 12 hours 03/26/12 1732     03/20/12 1800   levofloxacin (LEVAQUIN) IVPB 750 mg     Comments: Please ask pharmacy to does for crcl      750 mg 100 mL/hr over 90 Minutes Intravenous  Once 03/19/12 1851 03/20/12 1855   03/19/12 2000   vancomycin (VANCOCIN) 1,250 mg in sodium chloride 0.9 % 250 mL IVPB        1,250 mg 166.7 mL/hr over 90 Minutes Intravenous Every 12 hours 03/19/12 1907 03/26/12 1213   03/19/12 2000   piperacillin-tazobactam (ZOSYN) IVPB 3.375 g        3.375 g 12.5 mL/hr over 240 Minutes Intravenous Every 8 hours 03/19/12 1907 03/26/12 1959   03/19/12 1715   levofloxacin (LEVAQUIN) IVPB 750 mg        750 mg 100  mL/hr over 90 Minutes Intravenous  Once 03/19/12 1705 03/19/12 1951          Assessment: 78 yr old male diagnosed with cecal adenocarcinoma in June. Admitted with a focal perforation of the cecum and had a colectomy done on 7/8. He was noted to have pneumonia on a CT scan. Initially vanc/zosyn were scheduled to stop today. However, per MD request, will continue antibiotic until Monday. Previous doses were appropriate.   Goal of Therapy:  Vancomycin trough level 15-20 mcg/ml  Plan:  1. Restart vanc 1250mg  IV Q12H 2. Restart zosyn 3.375gm IV Q8H (4 hour infusion) 3. F/u clinical status and plan 4. F/u renal fxn, trough if continued past Monday  John Summers, John Summers 03/26/2012,5:32 PM

## 2012-03-27 MED ORDER — HYDROCODONE-ACETAMINOPHEN 5-325 MG PO TABS: 1.0000 | ORAL_TABLET | ORAL | Status: DC | PRN

## 2012-03-27 MED ORDER — ACETAMINOPHEN 325 MG PO TABS
650.0000 mg | ORAL_TABLET | ORAL | Status: DC | PRN
  Administered 2012-03-27: 650 mg via ORAL
  Filled 2012-03-27: qty 2

## 2012-03-27 NOTE — Progress Notes (Signed)
Patient ID: RAMESES OU, male   DOB: 1932-10-04, 78 y.o.   MRN: [id number]  General Surgery - [facility] - Progress Note  POD# 6  Subjective: Patient doing well.  Ambulated yesterday.  Regular diet tolerated.  Objective: Vital signs in last 24 hours: Temp:  [97 F (36.1 C)-99 F (37.2 C)] 99 F (37.2 C) (07/14 0518) Pulse Rate:  [66-80] 66  (07/14 0518) Resp:  [17-18] 17  (07/14 0518) BP: (136-145)/(81-88) 138/88 mmHg (07/14 0518) SpO2:  [96 %-97 %] 96 % (07/14 0518) Last BM Date: 03/26/12  Intake/Output from previous day: 07/13 0701 - 07/14 0700 In: 2872.5 [P.O.:1320; I.V.:1252.5; IV Piggyback:300] Out: 2625 [Urine:2625]  Exam: HEENT - clear, not icteric Neck - soft Chest - coarse bilaterally Cor - RRR, no murmur Abd - soft without distension; BS present; BM x 2 overnight; dressing dry Ext - no significant edema Neuro - grossly intact, no focal deficits  Lab Results:   [facility] 03/25/12 0749  WBC 4.1  HGB 10.6*  HCT 30.3*  PLT 123*     Basename 03/26/12 0640 03/25/12 0749  NA 145 142  K 3.8 3.3*  CL 114* 109  CO2 24 24  GLUCOSE 118* 123*  BUN 8 7  CREATININE 1.01 0.94  CALCIUM 8.2* 8.0*    Studies/Results: No results found.  Assessment / Plan: 1.  Status post partial colectomy for carcinoma  - continue regular diet  - po pain Rx  - ambulate 2.  Pneumonia  - IV abx's through tomorrow 3.  Disposition  - planning on Rehab  - Case Mgr and SW to follow up on Monday  Velora Heckler, MD, [facility] Office: [phone]  03/27/2012

## 2012-03-27 NOTE — Progress Notes (Signed)
[facility]   PCP:  Hannah Beat, MD  Subjective: 78 y.o. 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 He came in with worsening right lower quadrant pain, and was found to have focal perforation of the cecum, s/p right colectomy on 03/21/12 by Dr Magnus Ivan. He also has an infiltrate incidentally noted on a CT scan of the abdomen.consistent with pneumonia.  His postoperative course has been complicated by acute delirium.  It is noted in his old records that he has a history of sundowning.  At the present time he is alert but confused and appears to be hallucinating.  He is unable to provide a reliable history as a result.  Objective:  Intake/Output Summary (Last 24 hours) at 03/27/12 1752 Last data filed at 03/27/12 1200  Gross per 24 hour  Intake 1596.67 ml  Output   2000 ml  Net -403.33 ml   Blood pressure 132/75, pulse 76, temperature 98.7 F (37.1 C), temperature source Oral, resp. rate 18, height 5\' 10"  (1.778 m), weight 104.418 kg (230 lb 3.2 oz), SpO2 98.00%.  CBG (last 3)  No results found for this basename: GLUCAP:3 in the last 72 hours Physical Exam: General: No acute respiratory distress - alert afebrile  Comfortable and oriented.  Lungs: Clear to auscultation bilaterally without wheezes or crackles Cardiovascular: Regular rate and rhythm without murmur gallop or rub  Abdomen: Mildly protuberant, nontender, nondistended, soft, bowel sounds hypoactive but positive, no rebound, no ascites, no appreciable mass Extremities: No significant cyanosis, clubbing, or edema bilateral lower extremities  Lab Results:  Basename 03/26/12 0640 03/25/12 0749  NA 145 142  K 3.8 3.3*  CL 114* 109  CO2 24 24  GLUCOSE 118* 123*  BUN 8 7  CREATININE 1.01 0.94  CALCIUM 8.2* 8.0*  MG -- 1.9  PHOS -- --   No results found for this basename: AST:2,ALT:2,ALKPHOS:2,BILITOT:2,PROT:2,ALBUMIN:2 in the last 72 hours  Basename 03/25/12 0749  WBC 4.1  NEUTROABS --  HGB  10.6*  HCT 30.3*  MCV 94.4  PLT 123*   Micro Results: Recent Results (from the past 240 hour(s))  CULTURE, BLOOD (ROUTINE X 2)     Status: Normal   Collection Time   03/19/12  5:50 PM      Component Value Range Status Comment   Specimen Description BLOOD RIGHT HAND   Final    Special Requests BOTTLES DRAWN AEROBIC AND ANAEROBIC 10CC EA   Final    Culture  Setup Time 03/20/2012 02:12   Final    Culture NO GROWTH 5 DAYS   Final    Report Status 03/26/2012 FINAL   Final   CULTURE, BLOOD (ROUTINE X 2)     Status: Normal   Collection Time   03/19/12  6:00 PM      Component Value Range Status Comment   Specimen Description BLOOD LEFT HAND   Final    Special Requests BOTTLES DRAWN AEROBIC AND ANAEROBIC 10CC EA   Final    Culture  Setup Time 03/20/2012 02:12   Final    Culture NO GROWTH 5 DAYS   Final    Report Status 03/26/2012 FINAL   Final   URINE CULTURE     Status: Normal   Collection Time   03/19/12  6:53 PM      Component Value Range Status Comment   Specimen Description URINE, CLEAN CATCH   Final    Special Requests NONE   Final    Culture  Setup Time 03/20/2012 02:11  Final    Colony Count 35,000 COLONIES/ML   Final    Culture     Final    Value: Multiple bacterial morphotypes present, none predominant. Suggest appropriate recollection if clinically indicated.   Report Status 03/20/2012 FINAL   Final   SURGICAL PCR SCREEN     Status: Normal   Collection Time   03/20/12 10:11 PM      Component Value Range Status Comment   MRSA, PCR NEGATIVE  NEGATIVE Final    Staphylococcus aureus NEGATIVE  NEGATIVE Final     Studies/Results: All recent x-ray/radiology reports have been reviewed in detail.   Medications: I have reviewed the patient's complete medication list.  Assessment/Plan:  Adenocarcinoma of the cecum Status post right partial colectomy - General Surgery is following. Advanced diet by surgery  Path report consistent with adenoca of the colon. Further management as  per oncology as outpatient. Discussed with pt and his wife and son at bedside.  Healthcare acquired pneumonia Remains on empiric antibiotic therapy - will continue antibiotics till Monday.  Acute delirium in the setting of Dementia at baseline patient has a known history of sundowning noted during previous hospital stays - we will avoid all other sedating medications, especially benzodiazepines and sedating antihistamines - we will initiate nightly Seroquel -  Sleep apnea An attempt to resume CPAP in the setting of acute delirium would be ill advised - if the patient's sundowning resolves, resumption of his home CPAP regimen could then be considered  Gastroesophageal reflux disease Appears to be well-controlled at present  Hypertension Not well controlled. We will start his home medication tonight for better BP control.   Hypokalemia: replete as needed.  Dementia: will restart his Aricept.    [facility]  [facility]  Pager [phone]  If 7PM-7AM, please contact night-coverage  [email or web address]  Password [email or web address]

## 2012-03-28 MED ORDER — HYDROCODONE-ACETAMINOPHEN 5-325 MG PO TABS: 1.0000 | ORAL_TABLET | ORAL | Status: AC | PRN

## 2012-03-28 MED ORDER — HYDROCODONE-ACETAMINOPHEN 5-325 MG PO TABS: 1.0000 | ORAL_TABLET | ORAL | Status: DC | PRN

## 2012-03-28 MED ORDER — DOCUSATE SODIUM 50 MG PO CAPS: 100.0000 mg | ORAL_CAPSULE | Freq: Every day | ORAL | Status: AC | PRN

## 2012-03-28 NOTE — Progress Notes (Signed)
Clinical Social Work  CSW met with patient's wife and dtr at bedside. CSW reviewed offers with wife and followed up on [facility], [facility], [facility] and [facility]	[place]. All facilties denied bed offer with exception of [facility] who is reviewing information. CSW explained to wife that if [facility] was unable to offer that she would have to chose another facility. Wife and dtr were understanding of process. CSW explained if wife did not want other others she could take patient home but dtr reported that she felt SNF would be best for patient at this time. CSW will continue to follow. CSW has left 2 messages following up with [facility] but has not received a response at this time.  [place], [place] [phone]

## 2012-03-28 NOTE — Discharge Summary (Signed)
Physician Discharge Summary  John Summers YNW:[id number] DOB: 06-04-1933 DOA: 03/19/2012  PCP: John Beat, MD  Admit date: 03/19/2012 Discharge date: 03/28/2012  Recommendations for Outpatient Follow-up:  1. Follow up with oncology as recommended.   Discharge Diagnoses:  Active Problems:  Adenocarcinoma Of Cecum  Abdominal pain, acute  HCAP (healthcare-associated pneumonia)  GERD (gastroesophageal reflux disease) Hypokalemia OSA Dementia    Discharge Condition: stable  Diet recommendation: low sodium diet  History of present illness:  78 y.o. 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 He came in with worsening right lower quadrant pain, and was found to have focal perforation of the cecum, s/p right colectomy on 03/21/12 by Dr John Summers. He also has an infiltrate incidentally noted on a CT scan of the abdomen.consistent with pneumonia.  His postoperative course has been complicated by acute delirium. It is noted in his old records that he has a history of sundowning. At the present time he is alert and oriented. No confusion.  Hospital Course:   Adenocarcinoma of the cecum  Status post right partial colectomy - Path report consistent with adenoca of the colon. Further management as per oncology as outpatient. Discussed with pt and his wife and son at bedside.  Advanced diet to regular diet, he tolerated it without any abdominal pain, nausea or vomiting.  Healthcare acquired pneumonia  Completed the course of antibiotics for pneumonia.  Acute delirium in the setting of Dementia at baseline  patient has a known history of sundowning noted during previous hospital stays - we will avoid all other sedating medications, especially benzodiazepines and sedating antihistamines - . Sleep apnea  CPAP at night.  Gastroesophageal reflux disease  Appears to be well-controlled at present . Continue with home PPI Hypertension : controlled. Hypokalemia: replete as needed.    Dementia: will restart his Aricept.      Procedures:  S/p partial colectomy   Consultations:  Surgery  oncology  Discharge Exam: Filed Vitals:   03/28/12 1429  BP: 109/70  Pulse: 78  Temp: 97.9 F (36.6 C)  Resp: 18   Filed Vitals:   03/27/12 1400 03/27/12 2151 03/28/12 0609 03/28/12 1429  BP: 132/75 130/79 122/68 109/70  Pulse: 76 67 70 78  Temp: 98.7 F (37.1 C) 98.8 F (37.1 C) 97.8 F (36.6 C) 97.9 F (36.6 C)  TempSrc: Oral Oral Oral Oral  Resp: 18 18 18 18   Height:      Weight:      SpO2: 98% 97% 97% 97%   Physical Exam:  General: No acute respiratory distress - alert afebrile Comfortable and oriented.  Lungs: Clear to auscultation bilaterally without wheezes or crackles  Cardiovascular: Regular rate and rhythm without murmur gallop or rub  Abdomen: Mildly protuberant, nontender, nondistended, soft, bowel sounds  positive, no rebound, no ascites, no appreciable mass  Extremities: No significant cyanosis, clubbing, or edema bilateral lower extremities   Discharge Instructions  Discharge Orders    Future Orders Please Complete By Expires   Diet - low sodium heart healthy      Activity as tolerated - No restrictions      Discharge instructions      Comments:   Follow up with oncology as recommended.     Medication List  As of 03/28/2012  2:40 PM   TAKE these medications         docusate sodium 50 MG capsule   Commonly known as: COLACE   Take 2 capsules (100 mg total) by mouth daily as  needed for constipation.      donepezil 10 MG tablet   Commonly known as: ARICEPT   Take 10 mg by mouth at bedtime.      HYDROcodone-acetaminophen 5-325 MG per tablet   Commonly known as: NORCO   Take 1-2 tablets by mouth every 4 (four) hours as needed.      omeprazole 20 MG capsule   Commonly known as: PRILOSEC   Take 20 mg by mouth daily.      triamterene-hydrochlorothiazide 37.5-25 MG per tablet   Commonly known as: MAXZIDE-25   Take 0.5 each (0.5  tablets total) by mouth daily.              The results of significant diagnostics from this hospitalization (including imaging, microbiology, ancillary and laboratory) are listed below for reference.    Significant Diagnostic Studies: Ct Chest W Contrast  03/09/2012  *RADIOLOGY REPORT*  Clinical Data: Newly diagnosed colon mass.  CT CHEST WITH CONTRAST  Technique:  Multidetector CT imaging of the chest was performed following the standard protocol during bolus administration of intravenous contrast.  Contrast: 80mL OMNIPAQUE IOHEXOL 300 MG/ML  SOLN  Comparison: CT abdomen pelvis 02/20/2012.  Findings: No pathologically enlarged mediastinal, hilar or axillary lymph nodes.  Air in the main pulmonary artery is presumably echogenic.  Coronary artery calcification.  Heart size normal.  No pericardial effusion.  Small hiatal hernia.  There is subpleural airspace consolidation in the posterior segment right upper lobe.  Rounded air space consolidation in the right lower lobe is new from 02/20/2012.  There are additional areas of peribronchovascular consolidation in the right lower lobe, not previously imaged.  Questionable faint 3 mm nodule in the left lower lobe (image 25).  No pleural fluid.  Airway is unremarkable.  Incidental imaging of the upper abdomen shows no 1.5 cm low attenuation lesion in the region of the pancreatic head (image 52). No worrisome lytic or sclerotic lesions.  IMPRESSION:  1.  No definitive evidence of metastatic disease in the chest. 2.  Subpleural airspace consolidation in the right upper and right lower lobes with additional peribronchovascular nodularity in the right lower lobe.  Findings favor pneumonia.  Continued attention on follow-up exams is warranted.  Original Report Authenticated By: Reyes Summers, M.D.   Mr John Summers Wo Contrast  03/20/2012  *RADIOLOGY REPORT*  Clinical Data: Colon cancer.  Worsening confusion.  Assess for metastatic disease.  MRI HEAD WITHOUT AND  WITH CONTRAST  Technique:  Multiplanar, multiecho pulse sequences of the brain and surrounding structures were obtained according to standard protocol without and with intravenous contrast  Contrast: 20mL MULTIHANCE GADOBENATE DIMEGLUMINE 529 MG/ML IV SOLN  Comparison: 11/26/2009  Findings: Diffusion imaging does not show any acute or subacute infarction.  There are minimal small vessel changes in the pons. No cerebellar abnormality.  The cerebral hemispheres show chronic small vessel changes in the deep and subcortical white matter.  No cortical or large vessel territory infarction.  No primary or metastatic mass lesion, hemorrhage, hydrocephalus or extra-axial collection.  After contrast administration, no abnormal enhancement occurs.  No pituitary mass.  No skull or skull base lesion.  IMPRESSION: No acute finding.  No metastatic disease or acute infarction.  Chronic small vessel changes of the hemispheric white matter, fairly typical for age.  Original Report Authenticated By: Thomasenia Sales, M.D.   Ct Abdomen Pelvis W Contrast  03/19/2012  *RADIOLOGY REPORT*  Clinical Data: Abdominal pain following recent colonoscopy.  CT ABDOMEN AND PELVIS WITH  CONTRAST  Technique:  Multidetector CT imaging of the abdomen and pelvis was performed following the standard protocol during bolus administration of intravenous contrast.  Contrast: OMNIPAQUE IOHEXOL 300 MG/ML  SOLN  Comparison: 02/20/2012  Findings: There is pulmonary infiltrate in the right lower lobe consistent with pneumonia.  No pleural or pericardial fluid.  There is a hiatal hernia.  The liver has a normal appearance without focal lesions or biliary ductal dilatation.  The left lobe is diminutive.  There is been previous cholecystectomy.  Common duct is prominent as is typically seen following diameter.  The spleen is normal.  The pancreas shows a 10 mm cystic lesion along the ventral surface at the junction of the body and head.  This could be a small  cyst or cystic neoplasm. The adrenal glands are normal.  The kidneys are normal.  The aorta shows atherosclerotic change but no aneurysm.  The IVC crosses to the left of the aorta at the level of the renal veins.  Small bowel gas pattern is normal.  There is a small ventral hernia containing a small loop of small bowel.  No sign of obstruction or inflammation.  Cecal mass is again demonstrated, possibly progressive since the previous study.  There is inflammation of the fat surrounding the cecum.  This could be due to the recent intervention.  This could also be indicative of locally aggressive disease with regional spread.  The appendix does not appear grossly inflamed.  There is no free fluid in the pelvis.  Bladder, prostate gland and seminal vesicles appear unremarkable.  IMPRESSION: There is a patchy infiltrate in the right lung consistent with pneumonia.  The differential diagnosis does include pulmonary infarct due to emboli.  Worsening of the appearance in the region of the cecal tip.  It appears that the mass is progressive.  There is stranding in the regional fat, probably with some small lymph nodes.  The findings could represent inflammation following the recent colonoscopy and presumed biopsy.  Alternatively, this could indicate aggressive local progression of disease.  Small bowel loop now extends into the small ventral hernia but there is no evidence of obstruction or incarceration.  Original Report Authenticated By: Thomasenia Sales, M.D.   Dg Chest Port 1 View  03/20/2012  *RADIOLOGY REPORT*  Clinical Data: Pneumonia  PORTABLE CHEST - 1 VIEW  Comparison: 03/19/2012; chest CT - 03/09/2012  Findings: Grossly unchanged cardiac silhouette and mediastinal contours with mild elevation of the right hemidiaphragm.  Interval increase in right upper lung ill-defined heterogeneous possible airspace opacities.  Bibasilar heterogeneous opacities are grossly unchanged.  No definite pleural effusion or  pneumothorax. Unchanged bones.  IMPRESSION: Interval increase in ill-defined heterogeneous opacities within the right upper lung, worrisome for progression of infection. Continued attention on follow-up is recommended.  Original Report Authenticated By: Waynard Reeds, M.D.   Dg Chest Portable 1 View  03/19/2012  *RADIOLOGY REPORT*  Clinical Data: Cough.  Shortness of breath.  PORTABLE CHEST - 1 VIEW 03/19/2012 1315 hours:  Comparison: CT chest 03/09/2012.  Findings: Airspace consolidation deep in the right lower lobe on the prior CT vaguely visible on the current examination. Consolidation in the right upper lobe has improved in the interval, though minimal patchy airspace opacity persists.  Left lung remains clear.  Cardiac silhouette enlarged but stable.  Pulmonary vascularity normal without evidence of pulmonary edema.  IMPRESSION: Improved pneumonia in the right upper and right lower lobes, though patchy airspace opacities persist.  No new  abnormalities.  Stable cardiomegaly without pulmonary edema.  Original Report Authenticated By: Arnell Sieving, M.D.    Microbiology: Recent Results (from the past 240 hour(s))  CULTURE, BLOOD (ROUTINE X 2)     Status: Normal   Collection Time   03/19/12  5:50 PM      Component Value Range Status Comment   Specimen Description BLOOD RIGHT HAND   Final    Special Requests BOTTLES DRAWN AEROBIC AND ANAEROBIC 10CC EA   Final    Culture  Setup Time 03/20/2012 02:12   Final    Culture NO GROWTH 5 DAYS   Final    Report Status 03/26/2012 FINAL   Final   CULTURE, BLOOD (ROUTINE X 2)     Status: Normal   Collection Time   03/19/12  6:00 PM      Component Value Range Status Comment   Specimen Description BLOOD LEFT HAND   Final    Special Requests BOTTLES DRAWN AEROBIC AND ANAEROBIC 10CC EA   Final    Culture  Setup Time 03/20/2012 02:12   Final    Culture NO GROWTH 5 DAYS   Final    Report Status 03/26/2012 FINAL   Final   URINE CULTURE     Status: Normal    Collection Time   03/19/12  6:53 PM      Component Value Range Status Comment   Specimen Description URINE, CLEAN CATCH   Final    Special Requests NONE   Final    Culture  Setup Time 03/20/2012 02:11   Final    Colony Count 35,000 COLONIES/ML   Final    Culture     Final    Value: Multiple bacterial morphotypes present, none predominant. Suggest appropriate recollection if clinically indicated.   Report Status 03/20/2012 FINAL   Final   SURGICAL PCR SCREEN     Status: Normal   Collection Time   03/20/12 10:11 PM      Component Value Range Status Comment   MRSA, PCR NEGATIVE  NEGATIVE Final    Staphylococcus aureus NEGATIVE  NEGATIVE Final      Labs: Basic Metabolic Panel:  Lab 03/26/12 1610 03/25/12 0749 03/24/12 0715 03/23/12 0318 03/22/12 0525  NA 145 142 145 148* 140  K 3.8 3.3* 3.1* 3.5 3.7  CL 114* 109 112 113* 107  CO2 24 24 23 22 22   GLUCOSE 118* 123* 119* 110* 103*  BUN 8 7 6 6 6   CREATININE 1.01 0.94 0.80 0.91 0.98  CALCIUM 8.2* 8.0* 7.8* 8.1* 8.0*  MG -- 1.9 -- -- --  PHOS -- -- -- -- --   Liver Function Tests:  Lab 03/23/12 0318  AST 41*  ALT 35  ALKPHOS 99  BILITOT 0.6  PROT 6.0  ALBUMIN 2.3*   No results found for this basename: LIPASE:5,AMYLASE:5 in the last 168 hours No results found for this basename: AMMONIA:5 in the last 168 hours CBC:  Lab 03/25/12 0749 03/23/12 0318 03/22/12 0525  WBC 4.1 5.2 5.2  NEUTROABS -- -- --  HGB 10.6* 10.9* 10.8*  HCT 30.3* 31.7* 31.8*  MCV 94.4 95.5 97.0  PLT 123* 124* 100*   Cardiac Enzymes: No results found for this basename: CKTOTAL:5,CKMB:5,CKMBINDEX:5,TROPONINI:5 in the last 168 hours BNP: BNP (last 3 results) No results found for this basename: PROBNP:3 in the last 8760 hours CBG:  Lab 03/23/12 0555  GLUCAP 124*    Time coordinating discharge: 45 minutes   Signed:  Tiron Suski  [facility] 03/28/2012, 2:40 PM

## 2012-03-28 NOTE — Progress Notes (Signed)
Clinical Social Work  CSW spoke with wife who reported that she toured [facility] and preferred for patient to admit to that SNF. Wife prefers to transport patient to SNF. CSW called SNF who reported they received fax of dc summary and medications. CSW prepared dc packet and informed RN of dc. RN will give family chart copy at dc. CSW is signing off.  [place], [place] [phone]

## 2012-03-28 NOTE — Progress Notes (Signed)
Rn called [facility] 2 times in attempt to call report per policy

## 2012-03-28 NOTE — Progress Notes (Signed)
Clinical Social Work  CSW spoke with dtr who requested CSW look into [facility] as an option. CSW contacted SNF who was able to offer a bed today. CSW spoke with wife who is going to tour facility. CSW text paged MD regarding SNF bed available today.   [place], [place] [phone]

## 2012-03-28 NOTE — Progress Notes (Signed)
Physical Therapy Treatment Patient Details Name: DHAVAL WOO MRN: [id number] DOB: 31-Oct-1932 Today's Date: 03/28/2012 Time: 0950-1016 PT Time Calculation (min): 26 min  PT Assessment / Plan / Recommendation Comments on Treatment Session  Much improved from last session, ambulating further but still impaired cognition and safety awareness at this point with decreased knowledge/awareness of his need for help. Continue to rec SNf. Some c/o discomfort to left hip today which increased with distance ambulated (prior hip replacement in 2003-04).    Follow Up Recommendations  Skilled nursing facility    Barriers to Discharge        Equipment Recommendations  Defer to next venue    Recommendations for Other Services    Frequency Min 3X/week   Plan Discharge plan remains appropriate;Frequency remains appropriate    Precautions / Restrictions Precautions Precautions: Fall Precaution Comments: a bit impulsive Restrictions Weight Bearing Restrictions: No       Mobility  Transfers Sit to Stand: 4: Min guard;With upper extremity assist;With armrests;From chair/3-in-1 Stand to Sit: 4: Min guard;With upper extremity assist;With armrests;To chair/3-in-1 Details for Transfer Assistance: cues for safe hand placement Ambulation/Gait Ambulation/Gait Assistance: 4: Min guard;4: Min assist Ambulation Distance (Feet): 250 Feet Assistive device: Rolling walker Ambulation/Gait Assistance Details: cues for safe technique with RW especially during turns, pt became impulsive during ambulation speeding up too quickly and running into IV pole needing min verbal cues for safety, pt leans slightly to the right and can tend to stagger a bit on the right especially with left turns Gait Pattern: Step-through pattern;Trunk flexed    Exercises General Exercises - Lower Extremity Toe Raises: AROM;Both;10 reps;Seated Heel Raises: AROM;Both;10 reps;Seated     PT Goals Acute Rehab PT Goals Pt will go  Sit to Stand: with modified independence PT Goal: Sit to Stand - Progress: Updated due to goal met Pt will go Stand to Sit: with modified independence PT Goal: Stand to Sit - Progress: Updated due to goals met Pt will Ambulate: >150 feet;with modified independence;with least restrictive assistive device PT Goal: Ambulate - Progress: Updated due to goal met  Visit Information  Last PT Received On: 03/28/12 Assistance Needed: +1    Subjective Data  Subjective: If I told you no what would you have done.    Cognition  Overall Cognitive Status: Impaired Area of Impairment: Attention;Safety/judgement Arousal/Alertness: Awake/alert Orientation Level:  (pt had just answered these questions from seeing Ot) Behavior During Session: [facility] for tasks performed Current Attention Level: Selective Attention - Other Comments: for ~25 seconds at a time. Memory: Decreased recall of precautions Following Commands: Follows one step commands consistently Safety/Judgement: Decreased awareness of safety precautions;Decreased safety judgement for tasks assessed;Impulsive Awareness of Errors: Assistance required to identify errors made Cognition - Other Comments: Wife stated pt was more disoriented than usual today and was having visual hallucinations earlier.    Balance     End of Session PT - End of Session Equipment Utilized During Treatment: Gait belt Activity Tolerance: Patient tolerated treatment well Patient left: in chair;with call bell/phone within reach   GP     [facility] HELEN 03/28/2012, 10:31 AM

## 2012-03-28 NOTE — Progress Notes (Signed)
Agree with above, will need outpatient follow up for cancer, note states he needs abx through tomorrow but not written for will check with PA

## 2012-03-28 NOTE — Progress Notes (Signed)
Clinical Social Work  CSW spoke with [facility] who reported that they could not offer a bed. CSW informed wife and dtr who reported they wanted to discuss options. CSW will follow with family after decision has been made.  [place], [place] [phone]

## 2012-03-28 NOTE — Progress Notes (Signed)
Occupational Therapy Treatment Patient Details Name: John Summers MRN: [id number] DOB: [birth year] Today's Date: 03/28/2012 Time: 4098-1191 OT Time Calculation (min): 12 min  OT Assessment / Plan / Recommendation Comments on Treatment Session Pt continues to require max cues for ADL tasks to initiate, sequence and followthrough. Wife is hoping for d/c to snf today.    Follow Up Recommendations  Skilled nursing facility    Barriers to Discharge       Equipment Recommendations  Defer to next venue    Recommendations for Other Services    Frequency Min 2X/week   Plan Discharge plan remains appropriate    Precautions / Restrictions Precautions Precautions: Fall Restrictions Weight Bearing Restrictions: No   Pertinent Vitals/Pain Pt reported 5/10 abdominal pain. Repositioned for comfort.    ADL  Grooming: Performed;Teeth care;Brushing hair;Set up Where Assessed - Grooming: Unsupported sitting ADL Comments: Pt required max VCs to initiate and sequence grooming tasks. Attempted to drink the cup of water he had rinsed his toothbrush out in. Pt also required max cues to focus on task at hand. Easily distractible.    OT Diagnosis:    OT Problem List:   OT Treatment Interventions:     OT Goals ADL Goals ADL Goal: Grooming - Progress: Progressing toward goals  Visit Information  Last OT Received On: 03/28/12 Assistance Needed: +2    Subjective Data  Subjective: I think Im still at the hospital.   Prior Functioning       Cognition  Overall Cognitive Status: Impaired Area of Impairment: Attention;Memory;Awareness of errors;Problem solving Arousal/Alertness: Awake/alert Orientation Level: Disoriented to;Situation;Time Behavior During Session: [facility] for tasks performed Current Attention Level: Sustained Attention - Other Comments: for ~25 seconds at a time. Memory: Decreased recall of precautions Following Commands: Follows one step commands inconsistently;Follows one  step commands with increased time Safety/Judgement: Decreased awareness of safety precautions;Decreased safety judgement for tasks assessed Awareness of Errors: Assistance required to identify errors made;Assistance required to correct errors made Cognition - Other Comments: Wife stated pt was more disoriented than usual today and was having visual hallucinations earlier.    Mobility     Exercises    Balance    End of Session OT - End of Session Activity Tolerance: Patient tolerated treatment well Patient left: in chair;with call bell/phone within reach;with family/visitor present  GO     Steve Gregg A OTR/L [phone] 03/28/2012, 10:02 AM

## 2012-03-28 NOTE — Progress Notes (Addendum)
Patient ID: John Summers, male   DOB: May 10, 1933, 78 y.o.   MRN: [id number]   General Surgery - [facility] - Progress Note  POD# 7  Subjective: Patient doing well.  Ambulating well, tolerating regular diet, pain well controlled.  Objective: Vital signs in last 24 hours: Temp:  [97.8 F (36.6 C)-98.8 F (37.1 C)] 97.8 F (36.6 C) (07/15 0609) Pulse Rate:  [67-76] 70  (07/15 0609) Resp:  [18] 18  (07/15 0609) BP: (122-132)/(68-79) 122/68 mmHg (07/15 0609) SpO2:  [97 %-98 %] 97 % (07/15 0609) Last BM Date: 03/27/12  Intake/Output from previous day: 07/14 0701 - 07/15 0700 In: 1405.8 [P.O.:480; I.V.:625.8; IV Piggyback:300] Out: 2350 [Urine:2350]  Exam: Abd - soft, nondistender, non tender, +BS, staples in place  Lab Results:  No results found for this basename: WBC:2,HGB:2,HCT:2,PLT:2 in the last 72 hours   Basename 03/26/12 0640  NA 145  K 3.8  CL 114*  CO2 24  GLUCOSE 118*  BUN 8  CREATININE 1.01  CALCIUM 8.2*    Studies/Results: No results found.  Assessment / Plan: 1.  Status post partial colectomy for carcinoma  - continue regular diet  - po pain Rx  - ambulate  - Ok to shower with shower chair 2.  Pneumonia  - resolved, off abx now 3.  Disposition  - planning on Rehab and can go today if bed available.  Will check with care management and SW.  [facility], [facility] Office: [phone]  03/28/2012

## 2012-03-29 ENCOUNTER — Telehealth (INDEPENDENT_AMBULATORY_CARE_PROVIDER_SITE_OTHER): Payer: Self-pay | Admitting: General Surgery

## 2012-03-29 NOTE — Telephone Encounter (Signed)
Patient's wife called to make follow up appt. He had emergency colectomy on 03/21/12 by Dr Magnus Ivan and needs a nurse visit for staple removal this week. Please call with appt.

## 2012-04-01 ENCOUNTER — Ambulatory Visit (INDEPENDENT_AMBULATORY_CARE_PROVIDER_SITE_OTHER): Payer: Medicare Other | Admitting: General Surgery

## 2012-04-01 DIAGNOSIS — Z5189 Encounter for other specified aftercare: Secondary | ICD-10-CM

## 2012-04-01 DIAGNOSIS — Z4889 Encounter for other specified surgical aftercare: Secondary | ICD-10-CM

## 2012-04-01 NOTE — Progress Notes (Signed)
Subjective:     Patient ID: John Summers, male   DOB: [birth year], 78 y.o.   MRN: [id number]  HPI He's 11 days status post laparoscopic assisted right hemicolectomy and here for reversal he didn't for staple removal. However, he is having some clearish drainage from the periumbilical  region and still having some discomfort in the area. There is no redness or fevers  Review of Systems     Objective:   Physical Exam His incision is actually healing fine. In the area actually at the umbilicus the skin is excoriated and he's got a little bit of sloughing of the skin edges right at the umbilicus. He's got some clear drainage and I think his discomfort is mostly due to the skin excoriation and rash from the wet scan and draining wound. There is no evidence of wound infection. There is no erythema or purulence.    Assessment:     Status post laparoscopic assisted right hemicolectomy His wound appears to be healing well but does have some epidermolysis and a little bit of clear drainage at the umbilicus I think his pain is due to the skin irritation from the drainage and I recommended that they do more frequent dressing changes and keep some gauze in the umbilicus. This should heal fine and he will see Dr. Magnus Ivan back on Monday    Plan:     Follow up Monday with Dr. Magnus Ivan for repeat wound check

## 2012-04-04 ENCOUNTER — Ambulatory Visit (INDEPENDENT_AMBULATORY_CARE_PROVIDER_SITE_OTHER): Payer: Medicare Other | Admitting: Surgery

## 2012-04-04 VITALS — BP 118/82 | HR 80 | Temp 97.6°F | Resp 16 | Ht 71.0 in | Wt 214.0 lb

## 2012-04-04 DIAGNOSIS — Z09 Encounter for follow-up examination after completed treatment for conditions other than malignant neoplasm: Secondary | ICD-10-CM

## 2012-04-04 NOTE — Progress Notes (Signed)
Subjective:     Patient ID: John Summers, male   DOB: [birth year], 78 y.o.   MRN: [id number]  HPI He is here for another postop visit status post laparoscopic assisted right partial colectomy for cancer. He is still having some drainage at the umbilicus. His bowels are working well.  Review of Systems     Objective:   Physical Exam On exam, the incision is healing well with some mild necrosis of the skin at the umbilicus. There is no evidence of infection. I removed the remaining 2 staples    Assessment:     Patient status post right partial colectomy for T3 N0 colon cancer.    Plan:     They will continue his current wound care. I will see him back in 2 weeks. They will find out whether he will followup with the oncologist at [facility] versus the cancer center here. He will still refrain from heavy lifting

## 2012-04-21 ENCOUNTER — Encounter (INDEPENDENT_AMBULATORY_CARE_PROVIDER_SITE_OTHER): Payer: Self-pay | Admitting: Surgery

## 2012-04-21 ENCOUNTER — Ambulatory Visit (INDEPENDENT_AMBULATORY_CARE_PROVIDER_SITE_OTHER): Payer: Medicare Other | Admitting: Surgery

## 2012-04-21 VITALS — BP 122/82 | HR 72 | Temp 97.8°F | Resp 14 | Ht 70.0 in | Wt 218.2 lb

## 2012-04-21 DIAGNOSIS — Z09 Encounter for follow-up examination after completed treatment for conditions other than malignant neoplasm: Secondary | ICD-10-CM

## 2012-04-21 NOTE — Progress Notes (Signed)
Subjective:     Patient ID: John Summers, male   DOB: September 07, 1933, 78 y.o.   MRN: [id number]  HPI He is here for another postop visit status post laparoscopic right partial colectomy for cancer He is doing much better and has no complaints Review of Systems     Objective:   Physical Exam On exam, the incision is now healed except for one tiny area. There is no evidence of infection    Assessment:     Patient status post laparoscopic right partial colectomy for cancer    Plan:     He may resume normal activity. I will see him back as needed

## 2012-05-06 ENCOUNTER — Ambulatory Visit: Payer: Self-pay | Admitting: Oncology

## 2012-05-06 LAB — CBC CANCER CENTER
Basophil #: 0 x10 3/mm (ref 0.0–0.1)
Eosinophil #: 0.1 x10 3/mm (ref 0.0–0.7)
HGB: 12.2 g/dL — ABNORMAL LOW (ref 13.0–18.0)
Lymphocyte #: 1.3 x10 3/mm (ref 1.0–3.6)
MCH: 33.9 pg (ref 26.0–34.0)
MCHC: 33.5 g/dL (ref 32.0–36.0)
MCV: 101 fL — ABNORMAL HIGH (ref 80–100)
Monocyte #: 0.4 x10 3/mm (ref 0.2–1.0)
Neutrophil #: 1.8 x10 3/mm (ref 1.4–6.5)
Neutrophil %: 50.4 %
Platelet: 105 x10 3/mm — ABNORMAL LOW (ref 150–440)
RBC: 3.59 10*6/uL — ABNORMAL LOW (ref 4.40–5.90)
RDW: 15.6 % — ABNORMAL HIGH (ref 11.5–14.5)
WBC: 3.5 x10 3/mm — ABNORMAL LOW (ref 3.8–10.6)

## 2012-05-06 LAB — BASIC METABOLIC PANEL
Anion Gap: 8 (ref 7–16)
Calcium, Total: 8.4 mg/dL — ABNORMAL LOW (ref 8.5–10.1)
Chloride: 107 mmol/L (ref 98–107)
Co2: 27 mmol/L (ref 21–32)
Creatinine: 1.24 mg/dL (ref 0.60–1.30)
EGFR (African American): >60
Osmolality: 283 (ref 275–301)
Potassium: 3.9 mmol/L (ref 3.5–5.1)
Sodium: 142 mmol/L (ref 136–145)

## 2012-05-09 LAB — URINE IEP, RANDOM

## 2012-05-15 ENCOUNTER — Ambulatory Visit: Payer: Self-pay | Admitting: Oncology

## 2012-08-25 ENCOUNTER — Ambulatory Visit: Payer: Self-pay | Admitting: Oncology

## 2012-08-25 LAB — CBC CANCER CENTER
Basophil #: 0 x10 3/mm (ref 0.0–0.1)
Eosinophil #: 0.1 x10 3/mm (ref 0.0–0.7)
HCT: 39.2 % — ABNORMAL LOW (ref 40.0–52.0)
HGB: 13.6 g/dL (ref 13.0–18.0)
Lymphocyte #: 1.7 x10 3/mm (ref 1.0–3.6)
MCHC: 34.8 g/dL (ref 32.0–36.0)
MCV: 99 fL (ref 80–100)
Neutrophil #: 1.8 x10 3/mm (ref 1.4–6.5)
RDW: 13.7 % (ref 11.5–14.5)

## 2012-08-25 LAB — BASIC METABOLIC PANEL
BUN: 14 mg/dL (ref 7–18)
Creatinine: 1.26 mg/dL (ref 0.60–1.30)
EGFR (African American): >60
Glucose: 86 mg/dL (ref 65–99)
Potassium: 4.6 mmol/L (ref 3.5–5.1)
Sodium: 142 mmol/L (ref 136–145)

## 2012-08-26 LAB — PROT IMMUNOELECTROPHORES(ARMC)

## 2012-08-26 LAB — CEA: CEA: 1.6 ng/mL (ref 0.0–4.7)

## 2012-09-14 ENCOUNTER — Ambulatory Visit: Payer: Self-pay | Admitting: Oncology

## 2012-11-14 ENCOUNTER — Ambulatory Visit (INDEPENDENT_AMBULATORY_CARE_PROVIDER_SITE_OTHER): Payer: Medicare Other | Admitting: Surgery

## 2012-11-14 ENCOUNTER — Encounter (INDEPENDENT_AMBULATORY_CARE_PROVIDER_SITE_OTHER): Payer: Self-pay | Admitting: Surgery

## 2012-11-14 VITALS — BP 128/85 | HR 66 | Temp 98.0°F | Resp 14 | Ht 70.0 in | Wt 231.0 lb

## 2012-11-14 DIAGNOSIS — K432 Incisional hernia without obstruction or gangrene: Secondary | ICD-10-CM

## 2012-11-14 NOTE — Progress Notes (Signed)
Subjective:     Patient ID: John Summers, male   DOB: [birth year], 79 y.o.   MRN: [id number]  HPI This is a 79 year old gentleman who I performed a laparoscopic assisted right partial colectomy on in August 2013 for a pretty extensive colon cancer. At that time he had an umbilical hernia which was repaired primarily. He now has a recurrent hernia which is causing discomfort. He has burning pain but no nausea, vomiting, or obstructive symptoms. He elected not to have any other treatment for his colon cancer.  Review of Systems     Objective:   Physical Exam On exam, there is indeed a small fascial defect at the umbilicus which is easily reducible    Assessment:     Incisional hernia at the umbilicus     Plan:     We discussed repair with mesh versus expectant management. He was accompanied by his family. We decided to hold on any scans I will check a preoperative CEA. We will do this as an open procedure.

## 2012-12-07 ENCOUNTER — Encounter (HOSPITAL_COMMUNITY): Payer: Self-pay

## 2012-12-08 ENCOUNTER — Encounter (HOSPITAL_COMMUNITY): Payer: Self-pay

## 2012-12-08 ENCOUNTER — Encounter (HOSPITAL_COMMUNITY)
Admission: RE | Admit: 2012-12-08 | Discharge: 2012-12-08 | Disposition: A | Discharge status: Home / Self Care | Payer: Medicare Other | Source: Ambulatory Visit | Attending: Surgery | Admitting: Surgery

## 2012-12-08 ENCOUNTER — Ambulatory Visit (HOSPITAL_COMMUNITY)
Admission: RE | Admit: 2012-12-08 | Discharge: 2012-12-08 | Disposition: A | Discharge status: Home / Self Care | Payer: Medicare Other | Source: Ambulatory Visit | Attending: Anesthesiology | Admitting: Anesthesiology

## 2012-12-08 DIAGNOSIS — Z01818 Encounter for other preprocedural examination: Secondary | ICD-10-CM | POA: Insufficient documentation

## 2012-12-08 DIAGNOSIS — Z01811 Encounter for preprocedural respiratory examination: Secondary | ICD-10-CM | POA: Insufficient documentation

## 2012-12-08 DIAGNOSIS — Z01812 Encounter for preprocedural laboratory examination: Secondary | ICD-10-CM | POA: Insufficient documentation

## 2012-12-08 HISTORY — DX: Other complications of anesthesia, initial encounter: T88.59XA

## 2012-12-08 HISTORY — DX: Adverse effect of unspecified anesthetic, initial encounter: T41.45XA

## 2012-12-08 HISTORY — DX: Personal history of other medical treatment: Z92.89

## 2012-12-08 LAB — CBC
HCT: 38.6 % — ABNORMAL LOW (ref 39.0–52.0)
Hemoglobin: 13.9 g/dL (ref 13.0–17.0)
MCHC: 36 g/dL (ref 30.0–36.0)
RDW: 13.1 % (ref 11.5–15.5)
WBC: 4.5 10*3/uL (ref 4.0–10.5)

## 2012-12-08 LAB — BASIC METABOLIC PANEL
BUN: 22 mg/dL (ref 6–23)
Chloride: 106 mEq/L (ref 96–112)
Creatinine, Ser: 1.1 mg/dL (ref 0.50–1.35)
GFR calc Af Amer: 72 mL/min — ABNORMAL LOW (ref >90)
GFR calc non Af Amer: 62 mL/min — ABNORMAL LOW (ref >90)
Potassium: 4.5 mEq/L (ref 3.5–5.1)

## 2012-12-08 LAB — SURGICAL PCR SCREEN
MRSA, PCR: NEGATIVE
Staphylococcus aureus: NEGATIVE

## 2012-12-08 LAB — CEA: CEA: 1.6 ng/mL (ref 0.0–5.0)

## 2012-12-08 NOTE — Pre-Procedure Instructions (Addendum)
VIKASH NEST  12/08/2012   Your procedure is scheduled on:  Thursday, April 10th.  Report to [facility] at 5:30AM.  Call this number if you have problems the morning of surgery: [phone]   Remember:   Do not eat food or drink liquids after midnight.    Take these medicines the morning of surgery with A SIP OF WATER: Omeprazole (Prilosec).  Stop taking Aspirin, Coumadin, Plavix, Effient and Herbal medications.  Do not take any NSAIDs ie: Ibuprofen,  Advil,Naproxen or any medication containing Aspirin.   Do not wear jewelry, make-up or nail polish.  Do not wear lotions, powders, or perfumes. You may wear deodorant. Men may shave face and neck.  Do not bring valuables to the hospital.  Contacts, dentures or bridgework may not be worn into surgery.  Leave suitcase in the car. After surgery it may be brought to your room.  For patients admitted to the hospital, checkout time is 11:00 AM the day of discharge.   Patients discharged the day of surgery will not be allowed to drive home.  Name and phone number of your driver---    Special Instructions: Shower using CHG 2 nights before surgery and the night before surgery.  If you shower the day of surgery use CHG.  Use special wash - you have one bottle of CHG for all showers.  You should use approximately 1/3 of the bottle for each shower.   Please read over the following fact sheets that you were given: Pain Booklet, Coughing and Deep Breathing and Surgical Site Infection Prevention

## 2012-12-08 NOTE — Progress Notes (Signed)
Pt had a sleep study at [facility] in the past 2 years. I faxed a request for the results to [facility] records.  I also faxed a request to [facility] requesting a copy of stress test, maybe done 10 years ago.

## 2012-12-16 ENCOUNTER — Ambulatory Visit: Payer: Self-pay | Admitting: Oncology

## 2012-12-16 LAB — CBC CANCER CENTER
Basophil #: 0 x10 3/mm (ref 0.0–0.1)
Eosinophil %: 1.7 %
HGB: 14 g/dL (ref 13.0–18.0)
MCH: 33.6 pg (ref 26.0–34.0)
MCHC: 33.8 g/dL (ref 32.0–36.0)
MCV: 99 fL (ref 80–100)
Monocyte %: 10.6 %
Neutrophil #: 2.2 x10 3/mm (ref 1.4–6.5)
Neutrophil %: 59 %
RBC: 4.18 10*6/uL — ABNORMAL LOW (ref 4.40–5.90)
RDW: 13.6 % (ref 11.5–14.5)
WBC: 3.8 x10 3/mm (ref 3.8–10.6)

## 2012-12-16 LAB — BASIC METABOLIC PANEL
Anion Gap: 7 (ref 7–16)
BUN: 21 mg/dL — ABNORMAL HIGH (ref 7–18)
Calcium, Total: 8.3 mg/dL — ABNORMAL LOW (ref 8.5–10.1)
Chloride: 105 mmol/L (ref 98–107)
Creatinine: 1.28 mg/dL (ref 0.60–1.30)
Potassium: 4 mmol/L (ref 3.5–5.1)

## 2012-12-19 LAB — KAPPA/LAMBDA FREE LIGHT CHAINS (ARMC)

## 2012-12-19 LAB — CEA: CEA: 2.2 ng/mL (ref 0.0–4.7)

## 2012-12-19 LAB — URINE IEP, RANDOM

## 2012-12-20 ENCOUNTER — Other Ambulatory Visit: Payer: Self-pay | Admitting: Neurology

## 2012-12-21 MED ORDER — CEFAZOLIN SODIUM-DEXTROSE 2-3 GM-% IV SOLR
2.0000 g | INTRAVENOUS | Status: DC
  Filled 2012-12-21: qty 50

## 2012-12-21 NOTE — H&P (Signed)
John Summers is an 79 y.o. male.   Chief Complaint: Incisional hernia HPI: This is a gentleman who is status post a laparoscopic right partial colectomy in August of 2013 for colon cancer.  He now presents with a symptomatic incisional hernia at the umbilicus. He has pain but no obstructive symptoms. Preoperative CEA is normal.  Past Medical History  Diagnosis Date  . GERD (gastroesophageal reflux disease)   . Pollen allergies   . Sleep apnea   . Unspecified essential hypertension   . Thrombocytopenia     Dr. Orlie Dakin follows  . Skin cancer     ears  . History of colon polyps   . History of gallstones   . Status post dilation of esophageal narrowing   . Colon adenocarcinoma   . Kidney stones   . Complication of anesthesia     severe confusion  . Shortness of breath     just recently  . Dizzy spells     on and off for a long time- wife noticed more in past year.  Marland Kitchen Anxiety   . Depression   . Dementia   . Blood dyscrasia     Thromboytopenia  . HOH (hard of hearing)   . Hx of transfusion of platelets     Past Surgical History  Procedure Laterality Date  . Cholecystectomy  2011  . Tonsillectomy and adenoidectomy  1946  . Total hip arthroplasty      left  . Lithotripsy  2008    x2  . Pilonidal cyst excision    . Upper gastrointestinal endoscopy    . Partial colectomy  03/21/2012    Procedure: PARTIAL COLECTOMY;  Surgeon: Shelly Rubenstein, MD;  Location: [facility] OR;  Service: General;  Laterality: Right;  . Hernia repair  2004    Left    Family History  Problem Relation Age of Onset  . Colon cancer Mother   . Heart disease Mother   . Hypertension Mother   . Alcohol abuse Father   . Esophageal cancer Neg Hx   . Rectal cancer Neg Hx   . Stomach cancer Neg Hx    Social History:  reports that he has quit smoking. His smoking use included Pipe. He has never used smokeless tobacco. He reports that  drinks alcohol. He reports that he does not use illicit  drugs.  Allergies:  Allergies  Allergen Reactions  . Chicken Allergy     unknown    No prescriptions prior to admission    No results found for this or any previous visit (from the past 48 hour(s)). No results found.  Review of Systems  All other systems reviewed and are negative.    There were no vitals taken for this visit. Physical Exam  Constitutional: He is oriented to person, place, and time. He appears well-developed and well-nourished.  HENT:  Head: Normocephalic and atraumatic.  Eyes: Conjunctivae are normal. Pupils are equal, round, and reactive to light.  Neck: Normal range of motion. Neck supple.  Cardiovascular: Normal rate, regular rhythm and normal heart sounds.   Respiratory: Effort normal and breath sounds normal. No respiratory distress. He has no wheezes.  GI: Soft. Bowel sounds are normal. He exhibits no distension. There is no tenderness. There is no rebound.  Small, easily reducible hernia at the umbilicus  Musculoskeletal: Normal range of motion. He exhibits no edema.  Neurological: He is alert and oriented to person, place, and time.  Skin: Skin is warm and dry. No  erythema.  Psychiatric: His behavior is normal. Judgment normal.     Assessment/Plan Incisional hernia  We will go ahead and plan to proceed with open repair with mesh. We will try to limit his anesthesia. I discussed the risk of the patient and his family. These include but are not limited to bleeding, infection, injury to surrounding structures, recurrent hernia, et Karie Soda. I also discussed postoperative cardiopulmonary issues and DVT. He wishes to proceed. Likelihood of success is good  John Summers A 12/21/2012, 8:33 PM

## 2012-12-22 ENCOUNTER — Ambulatory Visit (HOSPITAL_COMMUNITY): Payer: Medicare Other | Admitting: Certified Registered"

## 2012-12-22 ENCOUNTER — Encounter (HOSPITAL_COMMUNITY)
Admission: RE | Disposition: A | Discharge status: Home / Self Care | Payer: Self-pay | Source: Ambulatory Visit | Attending: Surgery

## 2012-12-22 ENCOUNTER — Encounter (HOSPITAL_COMMUNITY): Payer: Self-pay | Admitting: *Deleted

## 2012-12-22 ENCOUNTER — Encounter (HOSPITAL_COMMUNITY): Payer: Self-pay | Admitting: Certified Registered"

## 2012-12-22 ENCOUNTER — Observation Stay (HOSPITAL_COMMUNITY)
Admission: RE | Admit: 2012-12-22 | Discharge: 2012-12-23 | Disposition: A | Discharge status: Home / Self Care | Payer: Medicare Other | Source: Ambulatory Visit | Attending: Surgery | Admitting: Surgery

## 2012-12-22 DIAGNOSIS — K432 Incisional hernia without obstruction or gangrene: Principal | ICD-10-CM | POA: Insufficient documentation

## 2012-12-22 DIAGNOSIS — I10 Essential (primary) hypertension: Secondary | ICD-10-CM | POA: Insufficient documentation

## 2012-12-22 DIAGNOSIS — Z85038 Personal history of other malignant neoplasm of large intestine: Secondary | ICD-10-CM | POA: Insufficient documentation

## 2012-12-22 HISTORY — PX: VENTRAL HERNIA REPAIR: SHX424

## 2012-12-22 HISTORY — DX: Immune thrombocytopenic purpura: D69.3

## 2012-12-22 HISTORY — PX: INSERTION OF MESH: SHX5868

## 2012-12-22 HISTORY — PX: HERNIA REPAIR: SHX51

## 2012-12-22 SURGERY — REPAIR, HERNIA, VENTRAL
Anesthesia: General | Site: Abdomen | Wound class: Clean

## 2012-12-22 MED ORDER — FENTANYL CITRATE 0.05 MG/ML IJ SOLN
INTRAMUSCULAR | Status: DC | PRN
  Administered 2012-12-22: 100 ug via INTRAVENOUS
  Administered 2012-12-22: 25 ug via INTRAVENOUS

## 2012-12-22 MED ORDER — PROPOFOL 10 MG/ML IV BOLUS
INTRAVENOUS | Status: DC | PRN
  Administered 2012-12-22: 200 mg via INTRAVENOUS

## 2012-12-22 MED ORDER — PANTOPRAZOLE SODIUM 40 MG PO TBEC: 40.0000 mg | DELAYED_RELEASE_TABLET | Freq: Every day | ORAL | Status: DC

## 2012-12-22 MED ORDER — ENOXAPARIN SODIUM 40 MG/0.4ML ~~LOC~~ SOLN
40.0000 mg | SUBCUTANEOUS | Status: DC
  Filled 2012-12-22 (×2): qty 0.4

## 2012-12-22 MED ORDER — ONDANSETRON HCL 4 MG PO TABS: 4.0000 mg | ORAL_TABLET | Freq: Four times a day (QID) | ORAL | Status: DC | PRN

## 2012-12-22 MED ORDER — LIDOCAINE HCL (CARDIAC) 20 MG/ML IV SOLN
INTRAVENOUS | Status: DC | PRN
  Administered 2012-12-22: 100 mg via INTRAVENOUS

## 2012-12-22 MED ORDER — KETOROLAC TROMETHAMINE 15 MG/ML IJ SOLN
INTRAMUSCULAR | Status: DC | PRN
  Administered 2012-12-22: 15 mg via INTRAVENOUS

## 2012-12-22 MED ORDER — ONDANSETRON HCL 4 MG/2ML IJ SOLN
INTRAMUSCULAR | Status: DC | PRN
  Administered 2012-12-22: 4 mg via INTRAVENOUS

## 2012-12-22 MED ORDER — BUPIVACAINE HCL (PF) 0.5 % IJ SOLN
INTRAMUSCULAR | Status: DC | PRN
  Administered 2012-12-22: 20 mL

## 2012-12-22 MED ORDER — BUPIVACAINE HCL (PF) 0.25 % IJ SOLN: INTRAMUSCULAR | Status: DC | PRN

## 2012-12-22 MED ORDER — HYDROCODONE-ACETAMINOPHEN 5-325 MG PO TABS
ORAL_TABLET | ORAL | Status: AC
  Filled 2012-12-22: qty 2

## 2012-12-22 MED ORDER — HYDROMORPHONE HCL PF 1 MG/ML IJ SOLN
INTRAMUSCULAR | Status: AC
  Filled 2012-12-22: qty 1

## 2012-12-22 MED ORDER — HYDROMORPHONE HCL PF 1 MG/ML IJ SOLN
0.2500 mg | INTRAMUSCULAR | Status: DC | PRN
  Administered 2012-12-22 (×2): 0.5 mg via INTRAVENOUS

## 2012-12-22 MED ORDER — TRIAMTERENE-HCTZ 37.5-25 MG PO TABS
0.5000 | ORAL_TABLET | Freq: Every day | ORAL | Status: DC
  Administered 2012-12-22 – 2012-12-23 (×2): 0.5 via ORAL
  Filled 2012-12-22 (×2): qty 0.5

## 2012-12-22 MED ORDER — DONEPEZIL HCL 10 MG PO TABS
10.0000 mg | ORAL_TABLET | Freq: Every day | ORAL | Status: DC
Start: 2012-12-22 — End: 2012-12-23
  Administered 2012-12-22: 10 mg via ORAL
  Filled 2012-12-22 (×2): qty 1

## 2012-12-22 MED ORDER — MORPHINE SULFATE 4 MG/ML IJ SOLN: 4.0000 mg | INTRAMUSCULAR | Status: DC | PRN

## 2012-12-22 MED ORDER — HYDROCODONE-ACETAMINOPHEN 5-325 MG PO TABS: 1.0000 | ORAL_TABLET | ORAL | Status: DC | PRN

## 2012-12-22 MED ORDER — HYDROCODONE-ACETAMINOPHEN 5-325 MG PO TABS
1.0000 | ORAL_TABLET | ORAL | Status: DC | PRN
  Administered 2012-12-22: 2 via ORAL
  Administered 2012-12-22 – 2012-12-23 (×2): 1 via ORAL
  Filled 2012-12-22 (×2): qty 1

## 2012-12-22 MED ORDER — LACTATED RINGERS IV SOLN
INTRAVENOUS | Status: DC | PRN
  Administered 2012-12-22 (×2): via INTRAVENOUS

## 2012-12-22 MED ORDER — ONDANSETRON HCL 4 MG/2ML IJ SOLN
4.0000 mg | Freq: Four times a day (QID) | INTRAMUSCULAR | Status: DC | PRN
  Administered 2012-12-22: 4 mg via INTRAVENOUS
  Filled 2012-12-22: qty 2

## 2012-12-22 MED ORDER — POTASSIUM CHLORIDE IN NACL 20-0.9 MEQ/L-% IV SOLN
INTRAVENOUS | Status: DC
  Administered 2012-12-22: 15:00:00 via INTRAVENOUS
  Administered 2012-12-23: 75 mL/h via INTRAVENOUS
  Filled 2012-12-22 (×3): qty 1000

## 2012-12-22 MED ORDER — BUPIVACAINE HCL (PF) 0.5 % IJ SOLN
INTRAMUSCULAR | Status: AC
  Filled 2012-12-22: qty 30

## 2012-12-22 MED ORDER — BUPIVACAINE-EPINEPHRINE PF 0.25-1:200000 % IJ SOLN
INTRAMUSCULAR | Status: AC
  Filled 2012-12-22: qty 30

## 2012-12-22 MED ORDER — 0.9 % SODIUM CHLORIDE (POUR BTL) OPTIME
TOPICAL | Status: DC | PRN
  Administered 2012-12-22: 1000 mL

## 2012-12-22 SURGICAL SUPPLY — 32 items
BENZOIN TINCTURE PRP APPL 2/3 (GAUZE/BANDAGES/DRESSINGS) ×2 IMPLANT
BLADE SURG ROTATE 9660 (MISCELLANEOUS) ×2 IMPLANT
CANISTER SUCTION 2500CC (MISCELLANEOUS) ×2 IMPLANT
CHLORAPREP W/TINT 26ML (MISCELLANEOUS) ×2 IMPLANT
CLOTH BEACON ORANGE TIMEOUT ST (SAFETY) ×2 IMPLANT
CLSR STERI-STRIP ANTIMIC 1/2X4 (GAUZE/BANDAGES/DRESSINGS) ×2 IMPLANT
COVER SURGICAL LIGHT HANDLE (MISCELLANEOUS) ×2 IMPLANT
DRAPE LAPAROSCOPIC ABDOMINAL (DRAPES) ×2 IMPLANT
DRSG TEGADERM 4X4.75 (GAUZE/BANDAGES/DRESSINGS) ×2 IMPLANT
ELECT CAUTERY BLADE 6.4 (BLADE) ×2 IMPLANT
ELECT REM PT RETURN 9FT ADLT (ELECTROSURGICAL) ×2
ELECTRODE REM PT RTRN 9FT ADLT (ELECTROSURGICAL) ×1 IMPLANT
GLOVE ECLIPSE 6.5 STRL STRAW (GLOVE) ×2 IMPLANT
GLOVE SURG SIGNA 7.5 PF LTX (GLOVE) ×2 IMPLANT
GLOVE SURG SS PI 6.5 STRL IVOR (GLOVE) ×2 IMPLANT
GOWN PREVENTION PLUS XLARGE (GOWN DISPOSABLE) ×2 IMPLANT
GOWN STRL NON-REIN LRG LVL3 (GOWN DISPOSABLE) ×4 IMPLANT
KIT BASIN OR (CUSTOM PROCEDURE TRAY) ×2 IMPLANT
KIT ROOM TURNOVER OR (KITS) ×2 IMPLANT
MESH VENTRALEX ST 8CM LRG (Mesh General) ×2 IMPLANT
NEEDLE HYPO 25GX1X1/2 BEV (NEEDLE) ×2 IMPLANT
NS IRRIG 1000ML POUR BTL (IV SOLUTION) ×2 IMPLANT
PACK GENERAL/GYN (CUSTOM PROCEDURE TRAY) ×2 IMPLANT
PAD ARMBOARD 7.5X6 YLW CONV (MISCELLANEOUS) ×2 IMPLANT
SPONGE GAUZE 4X4 12PLY (GAUZE/BANDAGES/DRESSINGS) ×2 IMPLANT
SUT MNCRL AB 4-0 PS2 18 (SUTURE) ×2 IMPLANT
SUT NOVA NAB DX-16 0-1 5-0 T12 (SUTURE) ×4 IMPLANT
SUT VIC AB 3-0 SH 27 (SUTURE) ×1
SUT VIC AB 3-0 SH 27XBRD (SUTURE) ×1 IMPLANT
SYR CONTROL 10ML LL (SYRINGE) ×2 IMPLANT
TOWEL OR 17X24 6PK STRL BLUE (TOWEL DISPOSABLE) ×2 IMPLANT
TOWEL OR 17X26 10 PK STRL BLUE (TOWEL DISPOSABLE) ×2 IMPLANT

## 2012-12-22 NOTE — Anesthesia Preprocedure Evaluation (Addendum)
Anesthesia Evaluation  Patient identified by MRN, date of birth, ID band Patient awake    Reviewed: Allergy & Precautions, H&P , NPO status , Patient's Chart, lab work & pertinent test results  History of Anesthesia Complications Negative for: history of anesthetic complications  Airway Mallampati: II TM Distance: >3 FB Neck ROM: Full    Dental  (+) Dental Advisory Given and Teeth Intact   Pulmonary shortness of breath and with exertion, sleep apnea and Continuous Positive Airway Pressure Ventilation , pneumonia -, resolved,          Cardiovascular hypertension, Pt. on medications Rhythm:Regular Rate:Normal     Neuro/Psych PSYCHIATRIC DISORDERS Anxiety Depression    GI/Hepatic GERD-  Medicated and Controlled,  Endo/Other  negative endocrine ROS  Renal/GU Renal disease     Musculoskeletal   Abdominal   Peds  Hematology   Anesthesia Other Findings   Reproductive/Obstetrics                         Anesthesia Physical Anesthesia Plan  ASA: III  Anesthesia Plan: General   Post-op Pain Management:    Induction: Intravenous  Airway Management Planned: Oral ETT  Additional Equipment:   Intra-op Plan:   Post-operative Plan: Extubation in OR  Informed Consent: I have reviewed the patients History and Physical, chart, labs and discussed the procedure including the risks, benefits and alternatives for the proposed anesthesia with the patient or authorized representative who has indicated his/her understanding and acceptance.   Dental advisory given  Plan Discussed with: CRNA and Anesthesiologist  Anesthesia Plan Comments:         Anesthesia Quick Evaluation

## 2012-12-22 NOTE — Preoperative (Signed)
Beta Blockers   Reason not to administer Beta Blockers:Not Applicable 

## 2012-12-22 NOTE — Interval H&P Note (Signed)
History and Physical Interval Note: no change in H and P  12/22/2012 6:40 AM  John Summers  has presented today for surgery, with the diagnosis of incisional hernia  The various methods of treatment have been discussed with the patient and family. After consideration of risks, benefits and other options for treatment, the patient has consented to  Procedure(s): HERNIA REPAIR INCISIONAL  (N/A) INSERTION OF MESH (N/A) as a surgical intervention .  [name] history has been reviewed, patient examined, no change in status, stable for surgery.  I have reviewed the patient's chart and labs.  Questions were answered to the patient's satisfaction.     Ceejay Kegley A

## 2012-12-22 NOTE — Transfer of Care (Signed)
Immediate Anesthesia Transfer of Care Note  Patient: John Summers  Procedure(s) Performed: Procedure(s): HERNIA REPAIR INCISIONAL  (N/A) INSERTION OF MESH (N/A)  Patient Location: PACU  Anesthesia Type:General  Level of Consciousness: awake, alert  and oriented  Airway & Oxygen Therapy: Patient Spontanous Breathing and Patient connected to nasal cannula oxygen  Post-op Assessment: Report given to PACU RN and Post -op Vital signs reviewed and stable  Post vital signs: Reviewed and stable  Complications: No apparent anesthesia complications

## 2012-12-22 NOTE — Op Note (Signed)
HERNIA REPAIR INCISIONAL , INSERTION OF MESH  Procedure Note  ORLIN KANN 12/22/2012   Pre-op Diagnosis: Incisional hernia     Post-op Diagnosis: same  Procedure(s): HERNIA REPAIR INCISIONAL  INSERTION OF MESH  Surgeon(s): Shelly Rubenstein, MD  Anesthesia: General  Staff:  Circulator: Netta Corrigan, RN Scrub Person: Johnnye Sima, CST  Estimated Blood Loss: Minimal           Matthewjames Petrasek A   Date: 12/22/2012  Time: 8:19 AM

## 2012-12-22 NOTE — Anesthesia Postprocedure Evaluation (Signed)
  Anesthesia Post-op Note  Patient: John Summers  Procedure(s) Performed: Procedure(s): HERNIA REPAIR INCISIONAL  (N/A) INSERTION OF MESH (N/A)  Patient Location: PACU  Anesthesia Type:General  Level of Consciousness: awake  Airway and Oxygen Therapy: Patient Spontanous Breathing  Post-op Pain: mild  Post-op Assessment: Post-op Vital signs reviewed  Post-op Vital Signs: Reviewed  Complications: No apparent anesthesia complications

## 2012-12-23 ENCOUNTER — Encounter (HOSPITAL_COMMUNITY): Payer: Self-pay | Admitting: Surgery

## 2012-12-23 NOTE — Op Note (Signed)
NAMESHARRIEFF, SPRATLIN            ACCOUNT NO.:  [id number]  MEDICAL RECORD NO.:  [id number]  LOCATION:  6N09C                        FACILITY:  [facility]  PHYSICIAN:  Abigail Miyamoto, M.D. DATE OF BIRTH:  20-Sep-1932  DATE OF PROCEDURE:  12/22/2012 DATE OF DISCHARGE:                              OPERATIVE REPORT   PREOPERATIVE DIAGNOSIS:  Incisional hernia at the umbilicus.  POSTOPERATIVE DIAGNOSIS:  Incisional hernia at the umbilicus.  PROCEDURES:  Incisional hernia repair with mesh.  SURGEON:  Abigail Miyamoto, M.D.  ANESTHESIA:  LMA with 0.5% Marcaine.  ESTIMATED BLOOD LOSS:  Minimal.  INDICATIONS:  This is a 79 year old gentleman, who had a previous laparoscopic right partial colectomy for colon cancer.  He now presents with a small fascial defect, which is symptomatic at the umbilicus.  FINDINGS:  The patient was found to have a small fascial defect with minimal adhesions.  It was repaired with an 8 cm round Ventralex ST hernia patch from [facility].  PROCEDURE:  The patient was brought to the operating room, identified as John Summers.  He was placed supine on the operative table, and general anesthesia was induced.  His abdomen was then prepped and draped in usual sterile fashion.  I anesthetized the skin with Marcaine.  I then made an incision through previous scar with a scalpel around the umbilicus and took this down to the hernia sac which was then opened and excised.  The patient had one adhesion of small bowel to the hernia sac which was excised.  The omentum was also excised from the hernia sac itself.  An 8 cm round Ventralex hernia patch from [facility] was brought into the field.  I placed it through the fascial defect and pulled it up taut using the peritoneal surface and sutured it in place with interrupted #1 Novafil sutures.  I was then able to close the fascia over the top of the mesh with figure-of-eight #1 Novafil sutures as well.  Good coverage of the fascial  defect appeared to be achieved circumferentially greater than 4 cm.  I then anesthetized the fascia further with Marcaine.  I closed the subcutaneous tissue with interrupted 3-0 Vicryl sutures and closed the skin with a running 4-0 Monocryl.  Steri-Strips, gauze, and Tegaderm were then applied.  The patient tolerated the procedure well.  All the counts were correct at the end of the procedure.  The patient was then extubated in the operating room and taken in stable condition to recovery room.     Abigail Miyamoto, M.D.     DB/MEDQ  D:  12/22/2012  T:  12/23/2012  Job:  [id number]

## 2012-12-23 NOTE — Progress Notes (Signed)
Pt discharged to home

## 2012-12-27 ENCOUNTER — Other Ambulatory Visit: Payer: Self-pay | Admitting: Family Medicine

## 2012-12-28 NOTE — Discharge Summary (Signed)
Physician Discharge Summary  Patient ID: John Summers MRN: [id number] DOB/AGE: Aug 21, 1933 79 y.o.  Admit date: 12/22/2012 Discharge date: 12/28/2012  Admission Diagnoses:  Discharge Diagnoses:  Active Problems:   * No active hospital problems. * incisional hernia  Discharged Condition: good  Hospital Course: uneventful post op recovery.  Discharged POD#1  Consults: None  Significant Diagnostic Studies:   Treatments: surgery: incisional hernia repair with mesh  Discharge Exam: Blood pressure 125/65, pulse 62, temperature 98.4 F (36.9 C), temperature source Oral, resp. rate 18, height 5\' 10"  (1.778 m), weight 241 lb 6.5 oz (109.5 kg), SpO2 96.00%. General appearance: alert and no distress Resp: clear to auscultation bilaterally Incision/Wound:clean  Disposition: 01-Home or Self Care  Discharge Orders   Future Appointments Provider Department Dept Phone   01/06/2013 1:30 PM Shelly Rubenstein, MD [facility], [place] [phone]   Future Orders Complete By Expires     Discharge patient  As directed         Medication List    TAKE these medications       donepezil 10 MG tablet  Commonly known as:  ARICEPT  take 1 tablet by mouth daily     HYDROcodone-acetaminophen 5-325 MG per tablet  Commonly known as:  NORCO  Take 1-2 tablets by mouth every 4 (four) hours as needed for pain.     omeprazole 20 MG capsule  Commonly known as:  PRILOSEC  Take 20 mg by mouth daily.           Follow-up Information   Follow up with [facility], MD. Call in 2 weeks.   Contact information:   [address] [place] [address] [phone]       Signed: Shelly Rubenstein 12/28/2012, 3:24 PM  2

## 2013-01-06 ENCOUNTER — Encounter (INDEPENDENT_AMBULATORY_CARE_PROVIDER_SITE_OTHER): Payer: Self-pay | Admitting: Surgery

## 2013-01-06 ENCOUNTER — Ambulatory Visit (INDEPENDENT_AMBULATORY_CARE_PROVIDER_SITE_OTHER): Payer: Medicare Other | Admitting: Surgery

## 2013-01-06 VITALS — BP 122/78 | HR 84 | Temp 98.4°F | Resp 16 | Ht 70.0 in | Wt 226.2 lb

## 2013-01-06 DIAGNOSIS — Z09 Encounter for follow-up examination after completed treatment for conditions other than malignant neoplasm: Secondary | ICD-10-CM

## 2013-01-06 NOTE — Progress Notes (Signed)
Subjective:     Patient ID: John Summers, male   DOB: December 08, 1932, 79 y.o.   MRN: [id number]  HPI He is here for his first postop visit status post incisional hernia repair with mesh. He is doing well and has no complaints from an abdominal standpoint  Review of Systems     Objective:   Physical Exam On exam, the incision is well healed there is no evidence of infection or recurrence    Assessment:     Patient stable postop     Plan:     He will refrain from heavy lifting for 2 more weeks. I will see him back as needed

## 2013-01-12 ENCOUNTER — Ambulatory Visit: Payer: Self-pay | Admitting: Oncology

## 2013-02-23 ENCOUNTER — Encounter: Payer: Self-pay | Admitting: Neurology

## 2013-02-23 ENCOUNTER — Ambulatory Visit (INDEPENDENT_AMBULATORY_CARE_PROVIDER_SITE_OTHER): Payer: Medicare Other | Admitting: Neurology

## 2013-02-23 VITALS — BP 162/88 | HR 60 | Temp 98.3°F | Ht 70.25 in | Wt 230.0 lb

## 2013-02-23 DIAGNOSIS — G4730 Sleep apnea, unspecified: Secondary | ICD-10-CM

## 2013-02-23 DIAGNOSIS — F03918 Unspecified dementia, unspecified severity, with other behavioral disturbance: Secondary | ICD-10-CM

## 2013-02-23 DIAGNOSIS — F0391 Unspecified dementia with behavioral disturbance: Secondary | ICD-10-CM

## 2013-02-23 DIAGNOSIS — F19921 Other psychoactive substance use, unspecified with intoxication with delirium: Secondary | ICD-10-CM

## 2013-02-23 DIAGNOSIS — T50904A Poisoning by unspecified drugs, medicaments and biological substances, undetermined, initial encounter: Secondary | ICD-10-CM

## 2013-02-23 DIAGNOSIS — R410 Disorientation, unspecified: Secondary | ICD-10-CM | POA: Insufficient documentation

## 2013-02-23 MED ORDER — CLONAZEPAM 0.25 MG PO TBDP: 0.2500 mg | ORAL_TABLET | Freq: Every evening | ORAL | Status: DC

## 2013-02-23 NOTE — Patient Instructions (Signed)
  Avoid  naps over 30 minute duration, Keep regular bedtimes and sleep times. To treat REM behavior disorder, klonipin at 0.25 mg nightly will be ordered. Every visit in 2 months we'll evaluate if you've sleep  without dream acting/ sleep  Talking.  We will then decide if a new sleep evaluation should be performed. Due to the history of central apneas and unattended sleep study is not indicated. Driving and Equipment Restrictions Some medical problems make it dangerous to drive, ride a bike, or use machines. Some of these problems are:  A hard blow to the head (concussion).  Passing out (fainting).  Twitching and shaking (seizures).  Low blood sugar.  Taking medicine to help you relax (sedatives).  Taking pain medicines.  Wearing an eye patch.  Wearing splints. This can make it hard to use parts of your body that you need to drive safely. HOME CARE   Do not drive until your doctor says it is okay.  Do not use machines until your doctor says it is okay. You may need a form signed by your doctor (medical release) before you can drive again. You may also need this form before you do other tasks where you need to be fully alert. MAKE SURE YOU:  Understand these instructions.  Will watch your condition.  Will get help right away if you are not doing well or get worse. Document Released: 10/08/2004 Document Revised: 11/23/2011 Document Reviewed: 01/08/2010 [facility] Patient Information 2014 [place], [place]. Alzheimer's Disease Alzheimer's disease is a breaking down of the brain that sometimes happens with aging. It often affects memory, thinking, and speaking. It also affects how you get along with people and job performance. Often the changes come on slowly and are not very noticeable. The changes may be silent and hidden for a long time, even from family and friends. Mood and personality changes often cause the family to seek medical care. CAUSES  Alzheimer's disease is the  leading cause of dementia. Dementia is a reduced ability of the working of the brain. Alzheimer's is one type of dementia and is caused by small changes in the brain. There are many causes of Alzheimer's disease. DIAGNOSIS  There are no specific tests for Alzheimer's disease, other than doing a biopsy(tissue sample) of the brain. This is not usually done. Physical examination and history often provide the best clues for a diagnosis. Often the diagnosis may be made over time, with more observation.  TREATMENT  There is no specific treatment for Alzheimer's disease. Your caregivers will discuss the particular problems you are dealing with or may deal with. They can help direct you to other caregivers who can help in the care of Alzheimer's patients.  Document Released: 06/10/2005 Document Revised: 11/23/2011 Document Reviewed: 01/31/2007 [facility] Patient Information 2014 [place], [place].

## 2013-02-23 NOTE — Progress Notes (Signed)
[facility]  Provider:  Dr Vickey Huger Referring Provider: Hannah Beat, MD Primary Care Physician:  Hannah Beat, MD  Chief Complaint  Patient presents with  . Follow-up    memory issues, rm 10    HPI:  John Summers is a 79 y.o. male here as a referral from Dr. Patsy Lager for progressive memory loss.   This Caucasian, right-handed, married male , a retired Art gallery manager, was previously followed by my colleague Dr. Kelli Hope.   Dr. Elvera Lennox  placed the patient on Aricept 10 mg daily and referred the patient for a sleep study. The sleep study revealed a complex apnea with an AHI of 26.7, performed on 03/20/2010. In supine sleep the AHI was 35.7 the patient had endorsed the Epworth sleepiness score at 14 points and at that time had a weight of 232 pounds and BMI of 33.3. Interesting was that REM sleep did not aggravate the apneas. The patient returned  on 04-02-10 for a CPAP titration which showed a moderate successful titration . It was noted that central apneas emerged, while  the obstructive apneas were successfully treated.  He tolerated overall best 7 cm water with a residual AHI of 6.4.   The patient used his CPAP into the year 2012, this is the  last available download at 5 hours 53 minutes at night, residual  AHI of 5.4 set at 7 cm water.  Wife reported that he often removed the mask on his face and that he had very vivid dreams or active partial the old on dreams after he had multiple surgeries resulting in a confusional state, he did not reinitiate CPAP for the last year. The patient seems to have developed a delirium.  Emu today for an entirely new problem Epworth sleepiness score is 10 points which is average. This fatigue severity score is only 19 point and sleep followup is not necessary in his opinion. His wife re ports him napping and falling asleep when not stimulated.  At this point we are here to address his memory.  Today's MMSE is 23 points , AFT 4 and  MOCA was 15 points.  Patient appears defensive. He wants to still drive .  He recently could not remember his grandson- asked after he left , who the young man was . Did not identify all family members during a wedding party.    Trouble with the balancing of the check book and bank accounts about 3 or 4 months ago.      Review of Systems: Out of a complete 14 system review, the patient complains of only the following symptoms, and all other reviewed systems are negative.  Memory loss, REM sleep talking, delirium . Hearing loss, diplopia.   History   Social History  . Marital Status: Married    Spouse Name: N/A    Number of Children: 2  . Years of Education: college   Occupational History  . retired     Retired, Print production planner, Counsellor   Social History Main Topics  . Smoking status: Former Smoker    Types: Pipe  . Smokeless tobacco: Never Used     Comment: i4/06/2013 "only smoked pipe n college 1958"  . Alcohol Use: Yes     Comment: 12/22/2012 "might have a beer; very rarely"  . Drug Use: No  . Sexually Active: No   Other Topics Concern  . Not on file   Social History Narrative  . No narrative on file    Family  History  Problem Relation Age of Onset  . Colon cancer Mother   . Heart disease Mother   . Hypertension Mother   . Alcohol abuse Father   . Esophageal cancer Neg Hx   . Rectal cancer Neg Hx   . Stomach cancer Neg Hx     Past Medical History  Diagnosis Date  . GERD (gastroesophageal reflux disease)   . Pollen allergies   . Unspecified essential hypertension   . History of colon polyps   . History of gallstones   . Complication of anesthesia     severe confusion  . Dizzy spells     on and off for a long time- wife noticed more in past year.  Marland Kitchen Anxiety   . Depression   . Dementia   . HOH (hard of hearing)   . Hx of transfusion of platelets   . Alzheimers disease ~ 2002    "not severe" (12/22/2012)  . Exertional shortness of breath      "sometimes" (12/22/2012)  . Sleep apnea     "won't wear mask anymore" (12/22/2012)  . ITP (idiopathic thrombocytopenic purpura)     Dr. Orlie Dakin follows  . Status post dilation of esophageal narrowing   . Arthritis     "hands" (12/22/2012)  . Kidney stones   . Skin cancer     ears  . Colon adenocarcinoma   . Central sleep apnea   . History of cholecystectomy     FOR GALLSTONES  . Pancreatitis 2/11  . Dementia with behavioral disturbance 02/23/2013    Memory loss since 2008 , Aricept  Ordered by Dr. Thad Ranger [phone].     Past Surgical History  Procedure Laterality Date  . Cholecystectomy  2011  . Tonsillectomy and adenoidectomy  1946  . Total hip arthroplasty Left ~ 2002  . Lithotripsy  2008    x2  . Pilonidal cyst excision  1960's?  Marland Kitchen Upper gastrointestinal endoscopy    . Partial colectomy  03/21/2012    Procedure: PARTIAL COLECTOMY;  Surgeon: Shelly Rubenstein, MD;  Location: [facility] OR;  Service: General;  Laterality: Right;  . Inguinal hernia repair Left [id number]  . Hernia repair  12/22/2012    IHR w/mesh  . Esophageal dilation  1990's?  . Cardiac catheterization  2000's    "tx'd w/acid reflux RX" (12/22/2012)  . Skin cancer excision      "face; ?left ear" (12/22/2012)  . Ventral hernia repair N/A 12/22/2012    Procedure: HERNIA REPAIR INCISIONAL ;  Surgeon: Shelly Rubenstein, MD;  Location: [facility] OR;  Service: General;  Laterality: N/A;  . Insertion of mesh N/A 12/22/2012    Procedure: INSERTION OF MESH;  Surgeon: Shelly Rubenstein, MD;  Location: [facility] OR;  Service: General;  Laterality: N/A;    Current Outpatient Prescriptions  Medication Sig Dispense Refill  . donepezil (ARICEPT) 10 MG tablet take 1 tablet by mouth daily  90 tablet  0  . omeprazole (PRILOSEC) 20 MG capsule Take 20 mg by mouth daily.        Marland Kitchen triamterene-hydrochlorothiazide (MAXZIDE-25) 37.5-25 MG per tablet take 1/2 tablet by mouth once daily  45 tablet  4   No current facility-administered medications for this  visit.    Allergies as of 02/23/2013 - Review Complete 02/23/2013  Allergen Reaction Noted  . Aspirin  02/23/2013  . Chicken allergy  02/20/2012    Vitals: BP 162/88  Pulse 60  Temp(Src) 98.3 F (36.8 C) (Oral)  Ht 5' 10.25" (  1.784 m)  Wt 230 lb (104.327 kg)  BMI 32.78 kg/m2 Last Weight:  Wt Readings from Last 1 Encounters:  02/23/13 230 lb (104.327 kg)   Last Height:   Ht Readings from Last 1 Encounters:  02/23/13 5' 10.25" (1.784 m)     Physical exam:  General: The patient is awake, alert and appears not in acute distress. The patient is well groomed. Head: Normocephalic, atraumatic. Neck is supple. Mallampati 2, neck circumference 15.5 inches . Cardiovascular:  Regular rate and rhythm, without  murmurs or carotid bruit, and without distended neck veins. Respiratory: Lungs are clear to auscultation. Skin:  Without evidence of edema, or rash Trunk: BMI is elevated and patient  has normal posture.  Neurologic exam : The patient is awake and alert, oriented to place and time.  Memory subjective  described as stabile- not intact. There is a normal attention span & concentration ability.  Speech is fluent without dysarthria, dysphonia , but aphasia and anomia . Mood and affect : frustrated .  Cranial nerves: Pupils are equal and briskly reactive to light. Funduscopic exam without   evidence of pallor or edema. Extraocular movements  in vertical and horizontal planes; nystagmus,  produced deviation diplopia- both eyes  Show nystagmus. Visual fields by finger perimetry are intact. Hearing impaired .  Facial sensation intact to fine touch. Facial motor strength is symmetric and tongue and uvula move midline.  Motor exam:  Normal tone and normal muscle bulk and symmetric grip   strength . Sensory:  Fine touch, pinprick and vibration were tested in all extremities. Proprioception is tested in the upper extremities only. This was  normal.  Coordination: Rapid alternating  movements in the fingers/hands is tested and normal. Finger-to-nose maneuver tested and normal without evidence of ataxia, dysmetria or tremor.  Gait and station: Patient walks without assistive device , inverted left foot,  And almost fell while turning - to the left. He reports lower back pain and took smaller steps at the beginning , than continued with normal step width.    Stance is stable and normal. Tandem gait is fragmented. Romberg testing is .  Deep tendon reflexes: in the  upper and lower extremities: left patella brisker than right. Babinski maneuver response is downgoing. This is the side of his hip replacement in 2004 .     Assessment:  After physical and neurologic examination, review of laboratory studies, imaging, neurophysiology testing and pre-existing records, assessment will be reviewed on the problem list.  This patient shows a progressive trend in his memory loss, and had intermittently developed delirium after his surgery. This worked be consistent with an moderate form of dementia.  Test MMSE  showed 24 of 30 points , and MOCA 15/ 30 points. Word recall was one of the main problems for the patient,  there was no visual-spatial abnormality. He was however unable to perform a Trail Making Test on paper. This s is consistent with Alzheimer's dementia and I would strongly advise not to drive.  A repeat Imaging study is not necessary - he had CT after delirium developed. No stroke was found, atrophy noted. .   Plan:  Treatment plan and additional workup will be reviewed under Problem List.   the patient has consistently taking Arice over the last 4 years or even longer. It would be beneficial to convert her going to a higher does also he does have sleep talking and sleep behavior problems which may be exacerbated by that medication. He also has  a complex apnea based on the studies from 2011. Klonopin can be used to reduce asleep talking or acting out dreams but it would have to  be taken at a very low dorsal to not aggravate the otherwise untreated apnea. Part of his problem to tolerate CPAP, was congestion and nasal discharge. The Donald's sure a success and reduction of the AHI from the 20s to the single digits but still would not sure optimal results. I will postpone any sleep evaluation and first see if the patient can tolerate a quarter milligram of Klonopin at night.

## 2013-02-23 NOTE — Assessment & Plan Note (Signed)
Patient was using CPAP for barely a year, than did not restart after 3 surgeries for  Intestinal cancer July 2013  April 2014. He developed delirium after general anesthesia July 2013 .

## 2013-02-23 NOTE — Addendum Note (Signed)
Addended by: [name] on: 02/23/2013 03:22 PM   Modules accepted: Orders

## 2013-02-24 ENCOUNTER — Encounter: Payer: Self-pay | Admitting: Gastroenterology

## 2013-03-01 ENCOUNTER — Telehealth: Payer: Self-pay | Admitting: *Deleted

## 2013-03-01 NOTE — Telephone Encounter (Signed)
Patient was concerned about the possible side-effects of Klonopin - currently prescribed 1 tab (0.25 mg) po hs and he has not started taking it yet.  I briefly explained the class of medication and its general intended use; I explained that 0.25 mg was a fairly small amount but that if the pt experienced negative side-effects they should discontinue it's use and contact us.  Although unlikely, I advised that if the side-effects were severe, they should call 911.  The pt and his wife felt more confident after our conversation and will begin the medication tonight as prescribed.

## 2013-03-01 NOTE — Telephone Encounter (Signed)
Message copied by Avie Echevaria on Wed Mar 01, 2013  3:25 PM ------      Message from: Philipp Ovens R      Created: Wed Mar 01, 2013 11:03 AM      Contact: wife-Judy       Doesn't like the side effects on the medication that Dr. Vickey Huger prescribed. Has some questions about this medication. ------

## 2013-03-14 ENCOUNTER — Ambulatory Visit: Payer: Self-pay | Admitting: Oncology

## 2013-03-20 ENCOUNTER — Other Ambulatory Visit: Payer: Self-pay | Admitting: Neurology

## 2013-03-20 LAB — BASIC METABOLIC PANEL
BUN: 17 mg/dL (ref 7–18)
Calcium, Total: 8.5 mg/dL (ref 8.5–10.1)
EGFR (African American): >60
Glucose: 105 mg/dL — ABNORMAL HIGH (ref 65–99)
Osmolality: 287 (ref 275–301)
Potassium: 3.8 mmol/L (ref 3.5–5.1)
Sodium: 143 mmol/L (ref 136–145)

## 2013-03-20 LAB — CBC CANCER CENTER
Basophil #: 0 x10 3/mm (ref 0.0–0.1)
Basophil %: 0.2 %
HGB: 14 g/dL (ref 13.0–18.0)
Lymphocyte #: 1 x10 3/mm (ref 1.0–3.6)
MCV: 100 fL (ref 80–100)
Monocyte %: 8.9 %
Neutrophil #: 1.7 x10 3/mm (ref 1.4–6.5)
Neutrophil %: 55.5 %
RBC: 3.97 10*6/uL — ABNORMAL LOW (ref 4.40–5.90)
RDW: 14 % (ref 11.5–14.5)
WBC: 3 x10 3/mm — ABNORMAL LOW (ref 3.8–10.6)

## 2013-03-21 LAB — PROT IMMUNOELECTROPHORES(ARMC)

## 2013-04-14 ENCOUNTER — Ambulatory Visit: Payer: Self-pay | Admitting: Oncology

## 2013-06-01 ENCOUNTER — Ambulatory Visit: Payer: Medicare Other | Admitting: Nurse Practitioner

## 2013-06-07 ENCOUNTER — Encounter: Payer: Self-pay | Admitting: Nurse Practitioner

## 2013-06-07 ENCOUNTER — Ambulatory Visit (INDEPENDENT_AMBULATORY_CARE_PROVIDER_SITE_OTHER): Payer: Medicare Other | Admitting: Nurse Practitioner

## 2013-06-07 VITALS — BP 129/76 | HR 59 | Ht 69.0 in | Wt 235.0 lb

## 2013-06-07 DIAGNOSIS — F03918 Unspecified dementia, unspecified severity, with other behavioral disturbance: Secondary | ICD-10-CM

## 2013-06-07 DIAGNOSIS — F0391 Unspecified dementia with behavioral disturbance: Secondary | ICD-10-CM

## 2013-06-07 DIAGNOSIS — G4730 Sleep apnea, unspecified: Secondary | ICD-10-CM

## 2013-06-07 MED ORDER — DONEPEZIL HCL 10 MG PO TABS: 10.0000 mg | ORAL_TABLET | Freq: Every day | ORAL | Status: DC

## 2013-06-07 NOTE — Progress Notes (Signed)
[facility]  PATIENT: John Summers DOB: 16-Sep-1932   REASON FOR VISIT: Memory loss   HISTORY OF PRESENT ILLNESS: John Summers, 79 -year-old returns for followup. He was last seen Dr. Vickey Huger 02/23/2013. He has a history of progressive memory loss and is currently on Aricept 10 mg daily without side effects. He is currently not using his CPAP for obstructive sleep apnea as he says he gets claustrophobic and isn't able to wear mask. He was also started on Klonopin to reduce his sleep talking and acting out dreams. He is not sure if it is working or not. Wife claims he still stops breathing at night.    HISTORY: 79 y.o. male here as a referral from Dr. Patsy Lager for progressive memory loss.  This Caucasian, right-handed, married male , a retired Art gallery manager, was previously followed by my colleague Dr. Kelli Hope.  Dr. Elvera Lennox placed the patient on Aricept 10 mg daily and referred the patient for a sleep study. The sleep study revealed a complex apnea with an AHI of 26.7, performed on 03/20/2010. In supine sleep the AHI was 35.7 the patient had endorsed the Epworth sleepiness score at 14 points and at that time had a weight of 232 pounds and BMI of 33.3. Interesting was that REM sleep did not aggravate the apneas. The patient returned on 04-02-10 for a CPAP titration which showed a moderate successful titration . It was noted that central apneas emerged, while the obstructive apneas were successfully treated.  He tolerated overall best 7 cm water with a residual AHI of 6.4.  The patient used his CPAP into the year 2012, this is the last available download at 5 hours 53 minutes at night, residual AHI of 5.4 set at 7 cm water.  Wife reported that he often removed the mask on his face and that he had very vivid dreams or active partial the old on dreams after he had multiple surgeries resulting in a confusional state, he did not reinitiate CPAP for the last year. The patient seems  to have developed a delirium.  Emu today for an entirely new problem Epworth sleepiness score is 10 points which is average. This fatigue severity score is only 19 point and sleep followup is not necessary in his opinion. His wife re ports him napping and falling asleep when not stimulated. At this point we are here to address his memory.  Today's MMSE is 23 points , AFT 4 and MOCA was 15 points. Patient appears defensive. He wants to still drive .  He recently could not remember his grandson- asked after he left , who the young man was . Did not identify all family members during a wedding party.  Trouble with the balancing of the check book and bank accounts about 3 or 4 months ago.    REVIEW OF SYSTEMS: Full 14 system review of systems performed and notable only for:  Constitutional: N/A  Cardiovascular: N/A  Ear/Nose/Throat: N/A  Skin: N/A  Eyes: N/A  Respiratory: N/A  Gastroitestinal: N/A  Hematology/Lymphatic: N/A  Endocrine: N/A Musculoskeletal:N/A  Allergy/Immunology: N/A  Neurological: N/A Psychiatric: N/A   ALLERGIES: Allergies  Allergen Reactions  . Aspirin   . Chicken Allergy     unknown    HOME MEDICATIONS: Outpatient Prescriptions Prior to Visit  Medication Sig Dispense Refill  . clonazePAM (KLONOPIN) 0.25 MG disintegrating tablet Take 1 tablet (0.25 mg total) by mouth Nightly.  30 tablet  3  . donepezil (ARICEPT) 10 MG tablet take  1 tablet by mouth once daily  90 tablet  1  . omeprazole (PRILOSEC) 20 MG capsule Take 20 mg by mouth daily.        Marland Kitchen triamterene-hydrochlorothiazide (MAXZIDE-25) 37.5-25 MG per tablet take 1/2 tablet by mouth once daily  45 tablet  4   No facility-administered medications prior to visit.    PAST MEDICAL HISTORY: Past Medical History  Diagnosis Date  . GERD (gastroesophageal reflux disease)   . Pollen allergies   . Unspecified essential hypertension   . History of colon polyps   . History of gallstones   . Complication of  anesthesia     severe confusion  . Dizzy spells     on and off for a long time- wife noticed more in past year.  Marland Kitchen Anxiety   . Depression   . Dementia   . HOH (hard of hearing)   . Hx of transfusion of platelets   . Alzheimers disease ~ 2002    "not severe" (12/22/2012)  . Exertional shortness of breath     "sometimes" (12/22/2012)  . Sleep apnea     "won't wear mask anymore" (12/22/2012)  . ITP (idiopathic thrombocytopenic purpura)     Dr. Orlie Dakin follows  . Status post dilation of esophageal narrowing   . Arthritis     "hands" (12/22/2012)  . Kidney stones   . Skin cancer     ears  . Colon adenocarcinoma   . Central sleep apnea   . History of cholecystectomy     FOR GALLSTONES  . Pancreatitis 2/11  . Dementia with behavioral disturbance 02/23/2013    Memory loss since 2008 , Aricept  Ordered by Dr. Thad Ranger [phone].     PAST SURGICAL HISTORY: Past Surgical History  Procedure Laterality Date  . Cholecystectomy  2011  . Tonsillectomy and adenoidectomy  1946  . Total hip arthroplasty Left ~ 2002  . Lithotripsy  2008    x2  . Pilonidal cyst excision  1960's?  Marland Kitchen Upper gastrointestinal endoscopy    . Partial colectomy  03/21/2012    Procedure: PARTIAL COLECTOMY;  Surgeon: Shelly Rubenstein, MD;  Location: [facility] OR;  Service: General;  Laterality: Right;  . Inguinal hernia repair Left 9811,9147  . Hernia repair  12/22/2012    IHR w/mesh  . Esophageal dilation  1990's?  . Cardiac catheterization  2000's    "tx'd w/acid reflux RX" (12/22/2012)  . Skin cancer excision      "face; ?left ear" (12/22/2012)  . Ventral hernia repair N/A 12/22/2012    Procedure: HERNIA REPAIR INCISIONAL ;  Surgeon: Shelly Rubenstein, MD;  Location: [facility] OR;  Service: General;  Laterality: N/A;  . Insertion of mesh N/A 12/22/2012    Procedure: INSERTION OF MESH;  Surgeon: Shelly Rubenstein, MD;  Location: [facility] OR;  Service: General;  Laterality: N/A;    FAMILY HISTORY: Family History  Problem Relation Age  of Onset  . Colon cancer Mother   . Heart disease Mother   . Hypertension Mother   . Alcohol abuse Father   . Esophageal cancer Neg Hx   . Rectal cancer Neg Hx   . Stomach cancer Neg Hx     SOCIAL HISTORY: History   Social History  . Marital Status: Married    Spouse Name: N/A    Number of Children: 2  . Years of Education: college   Occupational History  . retired     Retired, Print production planner, [employer]  Social History Main Topics  . Smoking status: Former Smoker    Types: Pipe  . Smokeless tobacco: Never Used     Comment: i4/06/2013 "only smoked pipe n college 1958"  . Alcohol Use: Yes     Comment: 12/22/2012 "might have a beer; very rarely"  . Drug Use: No  . Sexual Activity: No   Other Topics Concern  . Not on file   Social History Narrative  . No narrative on file     PHYSICAL EXAM  Filed Vitals:   06/07/13 1527  BP: 129/76  Pulse: 59  Height: 5\' 9"  (1.753 m)  Weight: 235 lb (106.595 kg)   Body mass index is 34.69 kg/(m^2).  Generalized: Well developed, in no acute distress  Head: normocephalic and atraumatic,. Oropharynx benign  Neck: Supple, no carotid bruits  Cardiac: Regular rate rhythm, no murmur  Musculoskeletal: No deformity   Neurological examination   Mentation: Alert MMSE 23/30. AFT 17.  Follows all commands speech and language fluent  Cranial nerve II-XII: Fundoscopic exam without papilledema .Pupils were equal round reactive to light extraocular movements in horizontal plane with no nystagmus  visual field were full on confrontational test. Facial sensation and strength were normal. hearing was decreased. Uvula tongue midline. head turning and shoulder shrug and were normal and symmetric.Tongue protrusion into cheek strength was normal. Motor: normal bulk and tone, full strength in the BUE, BLE, fine finger movements normal, no pronator drift. No focal weakness Coordination: finger-nose-finger, heel-to-shin bilaterally, no  dysmetria Gait and Station: Rising up from seated position without assistance, inverted left foot when walking no assistive device can walk on heels and toes unsteady with tandem.    DIAGNOSTIC DATA (LABS, IMAGING, TESTING) - I reviewed patient records, labs, notes, testing and imaging myself where available.  Lab Results  Component Value Date   WBC 4.5 12/08/2012   HGB 13.9 12/08/2012   HCT 38.6* 12/08/2012   MCV 94.1 12/08/2012   PLT 112* 12/08/2012      Component Value Date/Time   NA 142 12/08/2012 1031   K 4.5 12/08/2012 1031   CL 106 12/08/2012 1031   CO2 27 12/08/2012 1031   GLUCOSE 77 12/08/2012 1031   BUN 22 12/08/2012 1031   CREATININE 1.10 12/08/2012 1031   CALCIUM 9.3 12/08/2012 1031   PROT 6.0 03/23/2012 0318   ALBUMIN 2.3* 03/23/2012 0318   AST 41* 03/23/2012 0318   ALT 35 03/23/2012 0318   ALKPHOS 99 03/23/2012 0318   BILITOT 0.6 03/23/2012 0318   GFRNONAA 62* 12/08/2012 1031   GFRAA 72* 12/08/2012 1031       ASSESSMENT AND PLAN  79 y.o. year old male  with progressive memory loss. MMSE today 23/ 30 recent CT of the head,with atrophy noted.  History of sleep apnea currently not using CPAP consistently  Continue  Aricept at current dose will refill Patient to continue Klonopin Sleep lab to work with patient on desensitization as he currently does not use his CPAP because of claustrophobia Followup with Dr. Vickey Huger Nilda Riggs, [facility], [facility], APRN  [facility] [address] [place], [place] [address] [phone]

## 2013-06-07 NOTE — Progress Notes (Signed)
Patient walks over to sleep lab based upon [facility] referral for desensitization due to patient's report claustrophobia.  Review of records indicates this patient had a complex sleep apnea with both obstructive and central sleep apnea with less than ideal outcomes on CPAP therapy (residual AHI showing higher than 5/hr and patient proving intolerant to therapy and ultimately giving it up).    When questioned today about intolerance we identify the following: 1.  Patient c/o having to sleep on his back because of his mask and being unable to find a comfortable position. 2.  Patient c/o seasonal allergies and allergic rhinitis which needs to be dealt with or can surely make CPAP therapy difficult. 3.  Patient c/o automatic behaviors during sleep such as removing the mask, very restless behaviors, acting out dreams.  We discuss at length: 1.  Complex sleep apnea: central apneas vs. Obstructive and that it is possible his current CPAP settings may not be 100% effective and this could contribute to his intolerance. 2.  The need to treat allergies and rhinitis in order to successfully use CPAP. 3.  Using a small and comfortable mask that allows him to sleep in multiple positions and move around. 4.  Patient is concerned with excessive drooling during sleep, this can be uncomfortable to him. 5.  Wife sleeps in same room but in different bed and she expresses concern with his breathing and behaviors during sleep. 6.  Does he desire to have treatment and use therapy?  After some discussion, he admits it is "a pain in the butt" but both he and his wife agree that he wakes feeling worse than when he went to sleep and he would very much like to feel better and know he is treating his condition. 7.  We discussed that it may be in his best interest to undergo new sleep testing and that this may be key in helping him to become a compliant user again.  His last study was in 2011 and his downloads having been  sparkling.  He has had a lot of issues since 2011 with therapy.  A fresh start may be in his best interests. 8.  Using a sleep diary once starting CPAP therapy again to document his concerns each morning to help [place] identify and address issues promptly before he stops using it.  Patient and his wife were given a card.  They know I will forward our visit info to [name] and [name], MD.  Both patient and spouse sound like they would like him to be re-evaluated and possibly re-tested or re-titrated.  I told them to call if they had further questions or concerns.  I offered to take care of him on a Sunday night as a 1:1 patient to allow for special attention but assured him that all our techs are experience, compassionate, and capable.

## 2013-06-07 NOTE — Patient Instructions (Addendum)
Patient taking Aricept at current dose Patient to continue Klonopin Sleep lab to work with patient on desensitization as he currently does not use his CPAP because of claustrophobia Followup with Dr. Vickey Huger

## 2013-06-21 ENCOUNTER — Ambulatory Visit (INDEPENDENT_AMBULATORY_CARE_PROVIDER_SITE_OTHER): Payer: Medicare Other | Admitting: Family Medicine

## 2013-06-21 ENCOUNTER — Encounter: Payer: Self-pay | Admitting: Family Medicine

## 2013-06-21 VITALS — BP 130/76 | HR 59 | Temp 98.4°F | Ht 69.0 in | Wt 234.0 lb

## 2013-06-21 DIAGNOSIS — C189 Malignant neoplasm of colon, unspecified: Secondary | ICD-10-CM

## 2013-06-21 DIAGNOSIS — K432 Incisional hernia without obstruction or gangrene: Secondary | ICD-10-CM

## 2013-06-21 DIAGNOSIS — C180 Malignant neoplasm of cecum: Secondary | ICD-10-CM

## 2013-06-21 DIAGNOSIS — K439 Ventral hernia without obstruction or gangrene: Secondary | ICD-10-CM

## 2013-06-21 DIAGNOSIS — Z23 Encounter for immunization: Secondary | ICD-10-CM

## 2013-06-21 NOTE — Progress Notes (Signed)
[facility] [address] [place] [address] Phone: [phone] Fax: [phone]  Date:  06/21/2013   Name:  John Summers   DOB:  1933/03/08   MRN:  [id number] Gender: male Age: 79 y.o.  Primary Physician:  Hannah Beat, MD   Chief Complaint: Hernia   History of Present Illness:  John Summers is a 79 y.o. pleasant patient who presents with the following:  Discomfort in the same area where his prior hernia operation was.  03/2012 - partial colectomy 12/2012, s/p ventral hernia repair.   Now he feels a bulge at the hernia site from before and is still having a mild/moderate amount of pain.  He also is due for his colonoscopy.  Patient Active Problem List   Diagnosis Date Noted  . Delirium, drug-induced 02/23/2013  . Dementia with behavioral disturbance 02/23/2013  . Incisional hernia, without obstruction or gangrene 01-04-202014  . Colon cancer 03/19/2012  . Adenocarcinoma Of Cecum 03/19/2012  . Abdominal pain, acute 03/19/2012  . HCAP (healthcare-associated pneumonia) 03/19/2012  . GERD (gastroesophageal reflux disease) 03/19/2012  . Pollen allergies   . Kidney stones   . Dementia   . Sleep apnea   . Unspecified essential hypertension     Past Medical History  Diagnosis Date  . GERD (gastroesophageal reflux disease)   . Pollen allergies   . Unspecified essential hypertension   . History of colon polyps   . History of gallstones   . Complication of anesthesia     severe confusion  . Dizzy spells     on and off for a long time- wife noticed more in past year.  Marland Kitchen Anxiety   . Depression   . Dementia   . HOH (hard of hearing)   . Hx of transfusion of platelets   . Alzheimers disease ~ 2002    "not severe" (12/22/2012)  . Exertional shortness of breath     "sometimes" (12/22/2012)  . Sleep apnea     "won't wear mask anymore" (12/22/2012)  . ITP (idiopathic thrombocytopenic purpura)     Dr. Orlie Dakin follows  . Status  post dilation of esophageal narrowing   . Arthritis     "hands" (12/22/2012)  . Kidney stones   . Skin cancer     ears  . Colon adenocarcinoma   . Central sleep apnea   . History of cholecystectomy     FOR GALLSTONES  . Pancreatitis 2/11  . Dementia with behavioral disturbance 02/23/2013    Memory loss since 2008 , Aricept  Ordered by Dr. Thad Ranger [phone].     Past Surgical History  Procedure Laterality Date  . Cholecystectomy  2011  . Tonsillectomy and adenoidectomy  1946  . Total hip arthroplasty Left ~ 2002  . Lithotripsy  2008    x2  . Pilonidal cyst excision  1960's?  Marland Kitchen Upper gastrointestinal endoscopy    . Partial colectomy  03/21/2012    Procedure: PARTIAL COLECTOMY;  Surgeon: Shelly Rubenstein, MD;  Location: [facility];  Service: General;  Laterality: Right;  . Inguinal hernia repair Left 8469,6295  . Hernia repair  12/22/2012    IHR w/mesh  . Esophageal dilation  1990's?  . Cardiac catheterization  2000's    "tx'd w/acid reflux RX" (12/22/2012)  . Skin cancer excision      "face; ?left ear" (12/22/2012)  . Ventral hernia repair N/A 12/22/2012    Procedure: HERNIA REPAIR INCISIONAL ;  Surgeon: Shelly Rubenstein, MD;  Location: [facility];  Service: General;  Laterality: N/A;  . Insertion of mesh N/A 12/22/2012    Procedure: INSERTION OF MESH;  Surgeon: Shelly Rubenstein, MD;  Location: [facility];  Service: General;  Laterality: N/A;    History   Social History  . Marital Status: Married    Spouse Name: N/A    Number of Children: 2  . Years of Education: college   Occupational History  . retired     Retired, Print production planner, Counsellor   Social History Main Topics  . Smoking status: Former Smoker    Types: Pipe  . Smokeless tobacco: Never Used     Comment: i4/06/2013 "only smoked pipe n college 1958"  . Alcohol Use: No     Comment: 12/22/2012 "might have a beer; very rarely"  . Drug Use: No  . Sexual Activity: No   Other Topics Concern  . Not on file   Social  History Narrative  . No narrative on file    Family History  Problem Relation Age of Onset  . Colon cancer Mother   . Heart disease Mother   . Hypertension Mother   . Alcohol abuse Father   . Esophageal cancer Neg Hx   . Rectal cancer Neg Hx   . Stomach cancer Neg Hx     Allergies  Allergen Reactions  . Aspirin   . Chicken Allergy     unknown    Medication list has been reviewed and updated.  Outpatient Prescriptions Prior to Visit  Medication Sig Dispense Refill  . clonazePAM (KLONOPIN) 0.25 MG disintegrating tablet Take 1 tablet (0.25 mg total) by mouth Nightly.  30 tablet  3  . donepezil (ARICEPT) 10 MG tablet Take 1 tablet (10 mg total) by mouth daily.  90 tablet  1  . omeprazole (PRILOSEC) 20 MG capsule Take 20 mg by mouth daily.        Marland Kitchen triamterene-hydrochlorothiazide (MAXZIDE-25) 37.5-25 MG per tablet take 1/2 tablet by mouth once daily  45 tablet  4   No facility-administered medications prior to visit.    Review of Systems:   GEN: No acute illnesses, no fevers, chills. GI: No n/v/d, eating normally Pulm: No SOB Interactive and getting along well at home.  Otherwise, ROS is as per the HPI.   Physical Examination: BP 130/76  Pulse 59  Temp(Src) 98.4 F (36.9 C) (Oral)  Ht 5\' 9"  (1.753 m)  Wt 234 lb (106.142 kg)  BMI 34.54 kg/m2  Ideal Body Weight: Weight in (lb) to have BMI = 25: 168.9  GEN: WDWN, NAD, Non-toxic, A & O x 3 HEENT: Atraumatic, Normocephalic. Neck supple. No masses, No LAD. Ears and Nose: No external deformity. CV: RRR, No M/G/R. No JVD. No thrill. No extra heart sounds. PULM: CTA B, no wheezes, crackles, rhonchi. No retractions. No resp. distress. No accessory muscle use. ABD: S, mild tenderness diffusely and small bulge with coughing left, inferior, ND, +BS. No rebound. No HSM. EXTR: No c/c/e NEURO Normal gait.  PSYCH: Normally interactive. Conversant. Not depressed or anxious appearing.  Calm demeanor.    Assessment and  Plan:  Ventral hernia  Need for prophylactic vaccination and inoculation against influenza - Plan: Flu Vaccine QUAD 36+ mos IM  Adenocarcinoma of cecum  Colon cancer  Incisional hernia, without obstruction or gangrene  >25 minutes spent in face to face time with patient, >50% spent in counselling or coordination of care: discussed ventral hernias in detail. It is not bothering  him that much, it has improved over time. I counselled to continue to observe. Also discussed colon cancer sceening post known colon ca, and my advice was to pursue Colon Ca screening.  Orders Today:  Orders Placed This Encounter  Procedures  . Flu Vaccine QUAD 36+ mos IM    Updated Medication List: (Includes new medications, updates to list, dose adjustments) No orders of the defined types were placed in this encounter.    Medications Discontinued: There are no discontinued medications.    Signed,  Elpidio Galea. Kuper Rennels, MD

## 2013-07-07 ENCOUNTER — Other Ambulatory Visit: Payer: Self-pay | Admitting: Neurology

## 2013-07-11 ENCOUNTER — Other Ambulatory Visit: Payer: Self-pay

## 2013-07-11 DIAGNOSIS — F0391 Unspecified dementia with behavioral disturbance: Secondary | ICD-10-CM

## 2013-07-11 DIAGNOSIS — F03918 Unspecified dementia, unspecified severity, with other behavioral disturbance: Secondary | ICD-10-CM

## 2013-07-11 MED ORDER — CLONAZEPAM 0.25 MG PO TBDP: 0.2500 mg | ORAL_TABLET | Freq: Every evening | ORAL | Status: DC

## 2013-07-12 NOTE — Telephone Encounter (Signed)
Faxed to pharmacy

## 2013-09-25 ENCOUNTER — Ambulatory Visit: Payer: Self-pay | Admitting: Oncology

## 2013-09-25 LAB — BASIC METABOLIC PANEL
Anion Gap: 10 (ref 7–16)
BUN: 21 mg/dL — ABNORMAL HIGH (ref 7–18)
CALCIUM: 8.5 mg/dL (ref 8.5–10.1)
CHLORIDE: 109 mmol/L — AB (ref 98–107)
CREATININE: 1.15 mg/dL (ref 0.60–1.30)
Co2: 25 mmol/L (ref 21–32)
EGFR (African American): >60
Glucose: 120 mg/dL — ABNORMAL HIGH (ref 65–99)
Osmolality: 291 (ref 275–301)
Potassium: 3.7 mmol/L (ref 3.5–5.1)
Sodium: 144 mmol/L (ref 136–145)

## 2013-09-25 LAB — CBC CANCER CENTER
BASOS ABS: 0 x10 3/mm (ref 0.0–0.1)
Basophil %: 0.5 %
Eosinophil #: 0 x10 3/mm (ref 0.0–0.7)
Eosinophil %: 0.8 %
HCT: 40.4 % (ref 40.0–52.0)
HGB: 13.6 g/dL (ref 13.0–18.0)
LYMPHS ABS: 1.3 x10 3/mm (ref 1.0–3.6)
Lymphocyte %: 32.9 %
MCH: 33.9 pg (ref 26.0–34.0)
MCHC: 33.8 g/dL (ref 32.0–36.0)
MCV: 100 fL (ref 80–100)
MONOS PCT: 9.6 %
Monocyte #: 0.4 x10 3/mm (ref 0.2–1.0)
NEUTROS ABS: 2.3 x10 3/mm (ref 1.4–6.5)
Neutrophil %: 56.2 %
PLATELETS: 115 x10 3/mm — AB (ref 150–440)
RBC: 4.02 10*6/uL — AB (ref 4.40–5.90)
RDW: 13.1 % (ref 11.5–14.5)
WBC: 4.1 x10 3/mm (ref 3.8–10.6)

## 2013-09-26 LAB — KAPPA/LAMBDA FREE LIGHT CHAINS (ARMC)

## 2013-09-26 LAB — PROT IMMUNOELECTROPHORES(ARMC)

## 2013-09-27 LAB — URINE IEP, RANDOM

## 2013-10-15 ENCOUNTER — Ambulatory Visit: Payer: Self-pay | Admitting: Oncology

## 2013-11-11 ENCOUNTER — Other Ambulatory Visit: Payer: Self-pay | Admitting: Neurology

## 2013-11-13 NOTE — Telephone Encounter (Signed)
Rx signed and faxed.

## 2013-12-07 ENCOUNTER — Encounter: Payer: Self-pay | Admitting: Neurology

## 2013-12-07 ENCOUNTER — Encounter (INDEPENDENT_AMBULATORY_CARE_PROVIDER_SITE_OTHER): Payer: Self-pay

## 2013-12-07 ENCOUNTER — Ambulatory Visit (INDEPENDENT_AMBULATORY_CARE_PROVIDER_SITE_OTHER): Payer: Medicare Other | Admitting: Neurology

## 2013-12-07 VITALS — BP 109/74 | HR 64 | Ht 69.25 in | Wt 234.0 lb

## 2013-12-07 DIAGNOSIS — R413 Other amnesia: Secondary | ICD-10-CM | POA: Insufficient documentation

## 2013-12-07 DIAGNOSIS — G4752 REM sleep behavior disorder: Secondary | ICD-10-CM | POA: Insufficient documentation

## 2013-12-07 DIAGNOSIS — G4733 Obstructive sleep apnea (adult) (pediatric): Secondary | ICD-10-CM

## 2013-12-07 MED ORDER — CLONAZEPAM 0.5 MG PO TABS: 0.2500 mg | ORAL_TABLET | Freq: Every day | ORAL | Status: DC

## 2013-12-07 NOTE — Progress Notes (Signed)
[facility]  PATIENT: John Summers DOB: 1933/03/30   REASON FOR VISIT: Memory loss   HISTORY OF PRESENT ILLNESS: John Summers, 80 -year-old returns for followup. He was last seen  Cecille Rubin . He has a history of progressive memory loss and is currently on Aricept 10 mg daily without side effects. He is currently not using his CPAP for obstructive sleep apnea as he says he gets claustrophobic and isn't able to wear mask.  He was also started on Klonopin to reduce his sleep talking and acting out dreams, supporting having REM BD . He is not sure if it is working or not. Wife claims he still stops breathing at night. Dr Doy Mince had followed  this patient in the past and had ordered a sleep study , performed on  03-20-09 , which revealed an  AHI of 26.7 .   Todays MMSE is 22 out of 30 points. FSS is 39 and Epworth 9 points. He claims Klonipin helps with the vivid dreams.    HISTORY: 80 y.o. male here as a referral from Dr. Lorelei Pont for progressive memory loss.  This Caucasian right-handed, married male , a retired Chief Financial Officer, was previously followed by my colleague Dr. Elvia Collum.  Dr. Alfonso Patten placed the patient on Aricept 10 mg daily and referred the patient for a sleep study. The sleep study revealed a complex apnea with an AHI of 26.7, performed on 03/20/2010. In supine sleep the AHI was 35.7 the patient had endorsed the Epworth sleepiness score at 14 points and at that time had a weight of 232 pounds and BMI of 33.3. Interesting was that REM sleep did not aggravate the apneas. The patient returned on 04-02-10 for a CPAP titration which showed a moderate successful titration . It was noted that central apneas emerged, while the obstructive apneas were successfully treated.  He tolerated overall best 7 cm water with a residual AHI of 6.4.  The patient used his CPAP into the year 2012, this is the last available download at 5 hours 53 minutes at night, residual AHI of  5.4 set at 7 cm water.  Wife reported that he often removed the mask on his face and that he had very vivid dreams or active partial the old on dreams after he had multiple surgeries resulting in a confusional state, he did not reinitiate CPAP for the last year. The patient seems to have developed a delirium.   Memory evaluation ,  entirely new problem . Epworth sleepiness score is 10 points -which is average. This fatigue severity score was  only 19 point and sleep followup is not necessary in his opinion.  His wife re ports him napping and falling asleep when not stimulated. At this point we are here to address his memory.  Today's MMSE is 23 points , AFT 4 and MOCA was 15 points. Patient appears defensive. He wants to still drive .  He recently could not remember his grandson- asked after he left , who the young man was .  Did not identify all family members during a wedding party.  Trouble with the balancing of the check book and bank accounts about 3 or 4 months ago.    REVIEW OF SYSTEMS: Full 14 system review of systems performed and notable only for:  Fatigue, memory loss, progressive, repetitive questions, hearing loss.   If I read a long sentence , I can never remember details when I reach the end of it.    ALLERGIES:  Allergies  Allergen Reactions  . Aspirin   . Chicken Allergy     unknown    HOME MEDICATIONS: Outpatient Prescriptions Prior to Visit  Medication Sig Dispense Refill  . clonazePAM (KLONOPIN) 0.25 MG disintegrating tablet place 1 tablet by mouth NIGHTLY; DO NOT CHEW  30 tablet  5  . donepezil (ARICEPT) 10 MG tablet Take 1 tablet (10 mg total) by mouth daily.  90 tablet  1  . triamterene-hydrochlorothiazide (MAXZIDE-25) 37.5-25 MG per tablet take 1/2 tablet by mouth once daily  45 tablet  4  . omeprazole (PRILOSEC) 20 MG capsule Take 20 mg by mouth daily.         No facility-administered medications prior to visit.    PAST MEDICAL HISTORY: Past Medical  History  Diagnosis Date  . GERD (gastroesophageal reflux disease)   . Pollen allergies   . Unspecified essential hypertension   . History of colon polyps   . History of gallstones   . Complication of anesthesia     severe confusion  . Dizzy spells     on and off for a long time- wife noticed more in past year.  Marland Kitchen Anxiety   . Depression   . Dementia   . HOH (hard of hearing)   . Hx of transfusion of platelets   . Alzheimers disease ~ 2002    "not severe" (12/22/2012)  . Exertional shortness of breath     "sometimes" (12/22/2012)  . Sleep apnea     "won't wear mask anymore" (12/22/2012)  . ITP (idiopathic thrombocytopenic purpura)     Dr. Grayland Ormond follows  . Status post dilation of esophageal narrowing   . Arthritis     "hands" (12/22/2012)  . Kidney stones   . Skin cancer     ears  . Colon adenocarcinoma   . Central sleep apnea   . History of cholecystectomy     FOR GALLSTONES  . Pancreatitis 2/11  . Dementia with behavioral disturbance 02/23/2013    Memory loss since 2008 , Aricept  Ordered by Dr. Doy Mince [phone].     PAST SURGICAL HISTORY: Past Surgical History  Procedure Laterality Date  . Cholecystectomy  2011  . Tonsillectomy and adenoidectomy  1946  . Total hip arthroplasty Left ~ 2002  . Lithotripsy  2008    x2  . Pilonidal cyst excision  1960's?  Marland Kitchen Upper gastrointestinal endoscopy    . Partial colectomy  03/21/2012    Procedure: PARTIAL COLECTOMY;  Surgeon: Harl Bowie, MD;  Location: [place];  Service: General;  Laterality: Right;  . Inguinal hernia repair Left 7035,0093  . Hernia repair  12/22/2012    IHR w/mesh  . Esophageal dilation  1990's?  . Cardiac catheterization  2000's    "tx'd w/acid reflux RX" (12/22/2012)  . Skin cancer excision      "face; ?left ear" (12/22/2012)  . Ventral hernia repair N/A 12/22/2012    Procedure: HERNIA REPAIR INCISIONAL ;  Surgeon: Harl Bowie, MD;  Location: [place];  Service: General;  Laterality: N/A;  . Insertion  of mesh N/A 12/22/2012    Procedure: INSERTION OF MESH;  Surgeon: Harl Bowie, MD;  Location: [facility];  Service: General;  Laterality: N/A;    FAMILY HISTORY: Family History  Problem Relation Age of Onset  . Colon cancer Mother   . Heart disease Mother   . Hypertension Mother   . Alcohol abuse Father   . Esophageal cancer Neg Hx   . Rectal  cancer Neg Hx   . Stomach cancer Neg Hx     SOCIAL HISTORY: History   Social History  . Marital Status: Married    Spouse Name: Charlett Nose    Number of Children: 2  . Years of Education: college   Occupational History  .      Retired, Production manager, [employer]   Social History Main Topics  . Smoking status: Former Smoker    Types: Pipe  . Smokeless tobacco: Never Used     Comment: i4/06/2013 "only smoked pipe n college 1958"  . Alcohol Use: No     Comment: 12/22/2012 "might have a beer; very rarely"  . Drug Use: No  . Sexual Activity: No   Other Topics Concern  . Not on file   Social History Narrative   Patient is married Charlett Nose) and lives with his wife.   Patient has two children.   Patient is retired.   Patient has a college education.   Patient is right-handed.   Patient drinks 1-2 cups of coffee daily.     PHYSICAL EXAM  Filed Vitals:   12/07/13 1238  BP: 109/74  Pulse: 64  Height: 5' 9.25" (1.759 m)  Weight: 234 lb (106.142 kg)   Body mass index is 34.3 kg/(m^2).  Generalized: Well developed, in no acute distress  Head: normocephalic and atraumatic,. Oropharynx benign , . Mallampati 3 . Neck 18 inches.   Neck: Supple, no carotid bruits  Cardiac: Regular rate rhythm, no murmur  Musculoskeletal: No deformity   Neurological examination   Mentation: Alert MMSE 23/30. AFT 17.  Follows all commands speech and language fluent  Cranial nerve II-XII: Fundoscopic exam without papilledema .Pupils were equal round reactive to light extraocular movements in horizontal plane with no nystagmus  visual field  were full on confrontational test. Facial sensation and strength were normal. hearing was significantly decreased and added to the comprehension difficulties.   tongue midline. head turning and shoulder shrug and were normal and symmetric.Tongue protrusion into cheek strength was normal. Motor: normal bulk and tone, full strength in the BUE, BLE, fine finger movements normal, no pronator drift. No focal weakness Coordination: finger-nose-finger, heel-to-shin bilaterally, no dysmetria, no tremor.  Gait and Station: Rising up from seated position without assistance, inverted left foot when walking no assistive device can walk on heels and toes unsteady with tandem.    DIAGNOSTIC DATA (LABS, IMAGING, TESTING) - I reviewed patient records, labs, notes, testing and imaging myself where available. reviewed 2011 sleep study and will repeat a sleep study.   GDS elevated at 7 points, depression.    ASSESSMENT AND PLAN  80 y.o. year old male  with progressive memory loss. MMSE today 22/ 30 recent CT of the head,with atrophy noted.  Last MMSE was 23/ 30.  History of sleep apnea currently not using CPAP consistently  Continue  Aricept at current dose will refill Patient to continue Klonopin at lowest possible dose hs.  Sleep lab to work with patient on desensitization as he currently does not use his CPAP because of claustrophobia.  Followup with Dennie Bible, [facility], [facility], APRN   Dr. Larey Seat  [facility] [address], [place], [place] [address] [phone]

## 2013-12-31 ENCOUNTER — Encounter (INDEPENDENT_AMBULATORY_CARE_PROVIDER_SITE_OTHER): Payer: Medicare Other

## 2013-12-31 DIAGNOSIS — G4752 REM sleep behavior disorder: Secondary | ICD-10-CM

## 2013-12-31 DIAGNOSIS — R413 Other amnesia: Secondary | ICD-10-CM

## 2013-12-31 DIAGNOSIS — G4733 Obstructive sleep apnea (adult) (pediatric): Secondary | ICD-10-CM

## 2014-01-06 ENCOUNTER — Other Ambulatory Visit: Payer: Self-pay | Admitting: Family Medicine

## 2014-01-09 ENCOUNTER — Telehealth: Payer: Self-pay | Admitting: Neurology

## 2014-01-09 ENCOUNTER — Encounter: Payer: Self-pay | Admitting: *Deleted

## 2014-01-09 NOTE — Telephone Encounter (Signed)
I called and spoke with the patient and his spouse about his recent sleep study results. I informed the patient and his spouse that the study reveal complex sleep apnea and that he did well on ASV during the night of his study. Dr. Brett Fairy recommend patient and his wife meet with her before ordering the new machine. Patient's spouse has scheduled the appointment for May 4,2015 at 3:30 with an arrival time of 3:15. I informed the patient's spouse that I will fax a copy of the report to Dr. Lillie Fragmin office and mail a copy of the report to the patient.

## 2014-01-15 ENCOUNTER — Encounter: Payer: Self-pay | Admitting: Neurology

## 2014-01-15 ENCOUNTER — Ambulatory Visit (INDEPENDENT_AMBULATORY_CARE_PROVIDER_SITE_OTHER): Payer: Medicare Other | Admitting: Neurology

## 2014-01-15 ENCOUNTER — Encounter (INDEPENDENT_AMBULATORY_CARE_PROVIDER_SITE_OTHER): Payer: Self-pay

## 2014-01-15 VITALS — BP 133/85 | HR 61 | Resp 16 | Ht 69.0 in | Wt 235.0 lb

## 2014-01-15 DIAGNOSIS — R61 Generalized hyperhidrosis: Secondary | ICD-10-CM

## 2014-01-15 DIAGNOSIS — F0390 Unspecified dementia without behavioral disturbance: Secondary | ICD-10-CM

## 2014-01-15 DIAGNOSIS — R55 Syncope and collapse: Secondary | ICD-10-CM

## 2014-01-15 DIAGNOSIS — F068 Other specified mental disorders due to known physiological condition: Secondary | ICD-10-CM

## 2014-01-15 NOTE — Patient Instructions (Signed)
Dementia Dementia is a word that is used to describe problems with the brain and how it works. People with dementia have memory loss. They may also have problems with thinking, speaking, or solving problems. It can affect how they act around people, how they do their job, their mood, and their personality. These changes may not show up for a long time. Family or friends may not notice problems in the early part of this disease. HOME CARE The following tips are for the person living with, or caring for, the person with dementia. Make the home safe.  Remove locks on bathroom doors.  Use childproof locks on cabinets where alcohol, cleaning supplies, or chemicals are stored.  Put outlet covers in electrical outlets.  Put in childproof locks to keep doors and windows safe.  Remove stove knobs, or put in safety knobs that shut off on their own.  Lower the temperature on water heaters.  Label medicines. Lock them in a safe place.  Keep knives, lighters, matches, power tools, and guns out of reach or in a safe place.  Remove objects that might break or can hurt the person.  Make sure lighting is good inside and outside.  Put in grab bars if needed.  Use a device that detects falls or other needs for help. Lessen confusion.  Keep familiar objects and people around.  Use night lights or low lit (dim) lights at night.  Label objects or areas.  Use reminders, notes, or directions for daily activities or tasks.  Keep a simple routine that is the same for waking, meals, bathing, dressing, and bedtime.  Create a calm and quiet home.  Put up clocks and calendars.  Keep emergency numbers and the home address near all phones.  Help show the different times of day. Open the curtains during the day to let light in. Speak clearly and directly.  Choose simple words and short sentences.  Use a gentle, calm voice.  Do not interrupt.  If the person has a hard time finding a word to  use, give them the word or thought.  Ask 1 question at a time. Give enough time for the person to answer. Repeat the question if the person does not answer. Do things that lessen restlessness.  Provide a comfortable bed.  Have the same bedtime routine every night.  Have a regular walking and activity schedule.  Lessen naps during the day.  Do not let the person drink a lot of caffeine.  Go to events that are not overwhelming. Eat well and drink fluids.  Lessen distractions during meal times and snacks.  Avoid foods that are too hot or too cold.  Watch how the person chews and swallows. This is to make sure they do not choke. Other  Keep all vision, hearing, dental, and medical visits with the doctor.  Only give medicines as told by the doctor.  Watch the person's driving ability. Do not let the person drive if he or she cannot drive safely.  Use a program that helps find a person if they become missing. You may need to register with this program. GET HELP RIGHT AWAY IF:   A fever of 102 F (38.9 C) develops.  Confusion develops or gets worse.  Sleepiness develops or gets worse.  Staying awake is hard to do.  New behavior problems start like mood swings, aggression, and seeing things that are not there.  Problems with balance, speech, or falling develop.  Problems swallowing develop.  Any   problems of another sickness develop. MAKE SURE YOU:  Understand these instructions.  Will watch his or her condition.  Will get help right away if he or she is not doing well or gets worse. Document Released: 08/13/2008 Document Revised: 11/23/2011 Document Reviewed: 01/26/2011 Exit[employer] Patient Information 2014 [employer].  

## 2014-01-15 NOTE — Progress Notes (Signed)
[facility]  PATIENT: John Summers DOB: January 11, 1933   REASON FOR VISIT: Memory loss   HISTORY OF PRESENT ILLNESS: Mr. Brunsman, 80 -year-old returns for followup. He was last seen by me on 12-07-13 and before by Cecille Rubin . He  underwent a split-night polysomnography on 01-01-14. He had been excessively daytime sleepy and that frequently he also had complained of a nonrestorative sleep. In addition nocturnal diaphoresis and a feeling as if he may stagger or pass out . This has happened more  frequent during the last  12 month.  The sleep study revealed an AHI of 9.8 and RDI of 10.3, not truly enough to recommend CPAP therapy. The lowest oxygen saturation was 87% for total time of 17.6 minutes.  The heart rate varied between 57 and 64 beats per minute the patient was titrated that night starting at 5 cm water and the CPAP was only increased to 6 cm water. He did sleep for a long period of time at 4 cm water, this very low pressure may mean that he doesn't need CPAP at all.  I have discussed today with Mr. Clayton Bibles. and his pulse that there should be non-CPAP option for treatment for a mild apnea.  One of the first treatment options for this patient is to avoid sleeping supine.  I mentioned the tennis ball method, I also would like Mr. Roskelley to try to lose some weight,  which will help. I do not think that an apnea off this rather mild degree would explain his memory loss or contribute to a major part to it.  01-15-14 Epworth  sleepiness score was 12 points, and his geriatric depression score was endorsed at 8 points.       HISTORY: 80 y.o. male here as a referral from Dr. Lorelei Pont for progressive memory loss.  This Caucasian right-handed, married male , a retired Chief Financial Officer, was previously followed by my colleague Dr. Elvia Collum.  Dr. Alfonso Patten placed the patient on Aricept 10 mg daily and referred the patient for a sleep study. The sleep study revealed a complex apnea  with an AHI of 26.7, performed on 03/20/2010. In supine sleep the AHI was 35.7 the patient had endorsed the Epworth sleepiness score at 14 points and at that time had a weight of 232 pounds and BMI of 33.3. Interesting was that REM sleep did not aggravate the apneas. The patient returned on 04-02-10 for a CPAP titration which showed a moderate successful titration . It was noted that central apneas emerged, while the obstructive apneas were successfully treated.  He tolerated overall best 7 cm water with a residual AHI of 6.4.  The patient used his CPAP into the year 2012, this is the last available download at 5 hours 53 minutes at night, residual AHI of 5.4 set at 7 cm water.  Wife reported that he often removed the mask on his face and that he had very vivid dreams or active partial the old on dreams after he had multiple surgeries resulting in a confusional state, he did not reinitiate CPAP for the last year. The patient seems to have developed a delirium.  He has a history of progressive memory loss and is currently on Aricept 10 mg daily without side effects. He is currently not using his CPAP for obstructive sleep apnea as he says he gets claustrophobic and isn't able to wear mask.  He was also started on Klonopin to reduce his sleep talking and acting out dreams, supporting  having REM BD . He is not sure if it is working or not. Wife claims he still stops breathing at night. Dr Doy Mince had followed  this patient in the past and had ordered a sleep study , performed on  03-20-09 , which revealed an  AHI of 26.7 .  MMSE is 22 out of 30 points. FSS is 39 and Epworth 9 points. He claims Klonipin helps with the vivid dreams.     Memory evaluation ,  entirely new problem . Epworth sleepiness score is 10 points -which is average. This fatigue severity score was  only 19 point and sleep followup is not necessary in his opinion.  His wife re ports him napping and falling asleep when not stimulated. At this  point we are here to address his memory.  Today's MMSE is 23 points , AFT 4 and MOCA was 15 points. Patient appears defensive. He wants to still drive .  He recently could not remember his grandson- asked after he left , who the young man was .  Did not identify all family members during a wedding party.  Trouble with the balancing of the check book and bank accounts about 3 or 4 months ago.    REVIEW OF SYSTEMS: Full 14 system review of systems performed and notable only for:  Fatigue, memory loss, progressive, repetitive questions, hearing loss.  "If I read a long sentence , I can never remember details when I reach the end of it."    ALLERGIES: Allergies  Allergen Reactions  . Aspirin   . Chicken Allergy     unknown    HOME MEDICATIONS: Outpatient Prescriptions Prior to Visit  Medication Sig Dispense Refill  . clonazePAM (KLONOPIN) 0.5 MG tablet Take 0.5 tablets (0.25 mg total) by mouth at bedtime.  30 tablet  2  . donepezil (ARICEPT) 10 MG tablet Take 1 tablet (10 mg total) by mouth daily.  90 tablet  1  . esomeprazole (NEXIUM) 20 MG capsule Take 20 mg by mouth daily at 12 noon.      . triamterene-hydrochlorothiazide (MAXZIDE-25) 37.5-25 MG per tablet take 1/2 tablets by mouth once daily  45 tablet  0   No facility-administered medications prior to visit.    PAST MEDICAL HISTORY: Past Medical History  Diagnosis Date  . GERD (gastroesophageal reflux disease)   . Pollen allergies   . Unspecified essential hypertension   . History of colon polyps   . History of gallstones   . Complication of anesthesia     severe confusion  . Dizzy spells     on and off for a long time- wife noticed more in past year.  Marland Kitchen Anxiety   . Depression   . Dementia   . HOH (hard of hearing)   . Hx of transfusion of platelets   . Alzheimers disease ~ 2002    "not severe" (12/22/2012)  . Exertional shortness of breath     "sometimes" (12/22/2012)  . Sleep apnea     "won't wear mask anymore"  (12/22/2012)  . ITP (idiopathic thrombocytopenic purpura)     Dr. Grayland Ormond follows  . Status post dilation of esophageal narrowing   . Arthritis     "hands" (12/22/2012)  . Kidney stones   . Skin cancer     ears  . Colon adenocarcinoma   . Central sleep apnea   . History of cholecystectomy     FOR GALLSTONES  . Pancreatitis 2/11  . Dementia with behavioral disturbance 02/23/2013  Memory loss since 2008 , Aricept  Ordered by Dr. Doy Mince [phone].     PAST SURGICAL HISTORY: Past Surgical History  Procedure Laterality Date  . Cholecystectomy  2011  . Tonsillectomy and adenoidectomy  1946  . Total hip arthroplasty Left ~ 2002  . Lithotripsy  2008    x2  . Pilonidal cyst excision  1960's?  Marland Kitchen Upper gastrointestinal endoscopy    . Partial colectomy  03/21/2012    Procedure: PARTIAL COLECTOMY;  Surgeon: Harl Bowie, MD;  Location: [facility];  Service: General;  Laterality: Right;  . Inguinal hernia repair Left 2536,6440  . Hernia repair  12/22/2012    IHR w/mesh  . Esophageal dilation  1990's?  . Cardiac catheterization  2000's    "tx'd w/acid reflux RX" (12/22/2012)  . Skin cancer excision      "face; ?left ear" (12/22/2012)  . Ventral hernia repair N/A 12/22/2012    Procedure: HERNIA REPAIR INCISIONAL ;  Surgeon: Harl Bowie, MD;  Location: [facility];  Service: General;  Laterality: N/A;  . Insertion of mesh N/A 12/22/2012    Procedure: INSERTION OF MESH;  Surgeon: Harl Bowie, MD;  Location: [facility];  Service: General;  Laterality: N/A;    FAMILY HISTORY: Family History  Problem Relation Age of Onset  . Colon cancer Mother   . Heart disease Mother   . Hypertension Mother   . Alcohol abuse Father   . Esophageal cancer Neg Hx   . Rectal cancer Neg Hx   . Stomach cancer Neg Hx     SOCIAL HISTORY: History   Social History  . Marital Status: Married    Spouse Name: Charlett Nose    Number of Children: 2  . Years of Education: college   Occupational History  .       Retired, Production manager, [employer]   Social History Main Topics  . Smoking status: Former Smoker    Types: Pipe  . Smokeless tobacco: Never Used     Comment: i4/06/2013 "only smoked pipe n college 1958"  . Alcohol Use: No     Comment: 12/22/2012 "might have a beer; very rarely"  . Drug Use: No  . Sexual Activity: No   Other Topics Concern  . Not on file   Social History Narrative   Patient is married Charlett Nose) and lives with his wife.   Patient has two children and his wife has two children..   Patient is retired.   Patient has a college education.   Patient is right-handed.   Patient drinks 1-2 cups of coffee daily.     PHYSICAL EXAM  Filed Vitals:   01/15/14 1521  BP: 133/85  Pulse: 61  Resp: 16  Height: 5\' 9"  (1.753 m)  Weight: 235 lb (106.595 kg)   Body mass index is 34.69 kg/(m^2).  Generalized: Well developed, in no acute distress  Head: normocephalic and atraumatic,. Oropharynx benign , . Mallampati 3 . Neck 18 inches.   Neck: Supple, no carotid bruits  Cardiac: Regular rate rhythm, no murmur  Musculoskeletal: No deformity   Neurological examination   Mentation: Alert .  Follows all commands speech and language fluent  Cranial nerve II-XII: Fundoscopic exam without papilledema . Pupils were equal round reactive to light extraocular movements in horizontal plane with no nystagmus , the visual fields were full on confrontational test.  Facial sensation and strength were normal. hearing was significantly decreased and added to the comprehension difficulties.   tongue midline. head  turning and shoulder shrug and were normal and symmetric. Tongue protrusion into cheek strength was normal. Motor: normal bulk and tone, full strength - fine finger movements normal, no pronator drift. No focal weakness Coordination: finger-nose-finger, heel-to-shin bilaterally, no dysmetria, no tremor. He takes longer to process verbal commands than last visit.  Gait and  Station: Rising up from seated position without assistance, inverted left foot when walking-  no assistive device can walk on heels and toes.    DIAGNOSTIC DATA (LABS, IMAGING, TESTING) - I reviewed patient records, labs, notes, testing and imaging myself where available. reviewed 2011 sleep study and compared to the recent 4-15 one.   GDS elevated at 8 points, depression is present.  Dementia is present.     ASSESSMENT AND PLAN  80 y.o. year old male  with progressive memory loss.  MMSE today 22/ 30 recent CT of the head,with atrophy noted.  Last MMSE was 23/ 30.  History of sleep apnea currently not severe enough to need  CPAP consistently.  Continue  Aricept at current dose will refill Patient to continue Klonopin at lowest possible dose  At night for REM BD..  The patient does not need CPAP for this mild apnea, Tennisball methode recommended.  Dr.  Edilia Bo is asked to consider a heart rate montitor for this patient.  Aricept could induce bradycardia.   MRI brain ordered , MMSE to follow with NP .  Followup with Dennie Bible, GNP,  In 5-6 month,     [facility] Jakob Kimberlin. MD.  [facility] [address] [place], [place] [address] [phone]

## 2014-01-17 ENCOUNTER — Other Ambulatory Visit: Payer: Self-pay | Admitting: Neurology

## 2014-01-17 DIAGNOSIS — I6350 Cerebral infarction due to unspecified occlusion or stenosis of unspecified cerebral artery: Secondary | ICD-10-CM

## 2014-01-18 ENCOUNTER — Ambulatory Visit (INDEPENDENT_AMBULATORY_CARE_PROVIDER_SITE_OTHER): Payer: Medicare Other | Admitting: Radiology

## 2014-01-18 DIAGNOSIS — R55 Syncope and collapse: Secondary | ICD-10-CM

## 2014-01-18 DIAGNOSIS — F0390 Unspecified dementia without behavioral disturbance: Secondary | ICD-10-CM

## 2014-01-18 DIAGNOSIS — R61 Generalized hyperhidrosis: Secondary | ICD-10-CM

## 2014-01-24 ENCOUNTER — Ambulatory Visit (INDEPENDENT_AMBULATORY_CARE_PROVIDER_SITE_OTHER): Payer: Medicare Other

## 2014-01-24 ENCOUNTER — Ambulatory Visit (INDEPENDENT_AMBULATORY_CARE_PROVIDER_SITE_OTHER): Payer: Self-pay

## 2014-01-24 DIAGNOSIS — F0390 Unspecified dementia without behavioral disturbance: Secondary | ICD-10-CM

## 2014-01-24 DIAGNOSIS — R55 Syncope and collapse: Secondary | ICD-10-CM

## 2014-01-24 DIAGNOSIS — Z0289 Encounter for other administrative examinations: Secondary | ICD-10-CM

## 2014-01-24 DIAGNOSIS — R61 Generalized hyperhidrosis: Secondary | ICD-10-CM

## 2014-01-24 DIAGNOSIS — I6350 Cerebral infarction due to unspecified occlusion or stenosis of unspecified cerebral artery: Secondary | ICD-10-CM

## 2014-01-26 ENCOUNTER — Ambulatory Visit
Admission: RE | Admit: 2014-01-26 | Discharge: 2014-01-26 | Disposition: A | Discharge status: Home / Self Care | Payer: Medicare Other | Source: Ambulatory Visit | Attending: Neurology | Admitting: Neurology

## 2014-01-26 DIAGNOSIS — F0390 Unspecified dementia without behavioral disturbance: Secondary | ICD-10-CM

## 2014-01-26 DIAGNOSIS — R55 Syncope and collapse: Secondary | ICD-10-CM

## 2014-01-26 DIAGNOSIS — R61 Generalized hyperhidrosis: Secondary | ICD-10-CM

## 2014-02-07 NOTE — Procedures (Signed)
[facility]  EEG (ELECTROENCEPHALOGRAM) REPORT   STUDY DATE:   PATIENT NAME:   DOB:   [birth year] MRN:  [id number] ORDERING CLINICIAN: Larey Seat, MD   TECHNOLOGIST: FOX TECHNIQUE: Electroencephalogram was recorded utilizing standard 10-20 system of lead placement and reformatted into average and bipolar montages.      RECORDING TIME:  30 minutes minutes  ACTIVATION:  HV deferred  / strobe light used.Marland Kitchen   CLINICAL INFORMATION: Patient of Dr. Edilia Bo. Memory loss evaluation.   Medication listed separately.    FINDINGS: EEG  Background rhythm of 6  Hertz, generalized . Moderate diffuse slowing.  EKG in NSR at 60 bpm,  Photic entrainment without epileptiform discharges.  Patient was responsive during the test.       Patient recorded in the awake/ drowsystate.  EKG channel :  60  bpm .   Photic stimulation performed  / Hyperventilation was deferred.   IMPRESSION:  EEG is abnormal due to generalized slowing ,  The finding correlates with a diffuse or multifocal  encephalopathy and can be seen with medication side effects, infection, metabolic origin or due to  neurodegenerative diseases.          Larey Seat , MD

## 2014-02-08 ENCOUNTER — Telehealth: Payer: Self-pay | Admitting: Nurse Practitioner

## 2014-02-08 NOTE — Telephone Encounter (Signed)
Patient's spouse called regarding MRI results.

## 2014-02-08 NOTE — Telephone Encounter (Signed)
I have not seen the patient

## 2014-02-08 NOTE — Telephone Encounter (Signed)
Spoke with pt's wife and she is requesting MRI results. Please advise

## 2014-02-09 NOTE — Telephone Encounter (Signed)
They are in result notes- PLEASE LOOK>

## 2014-02-11 ENCOUNTER — Encounter: Payer: Self-pay | Admitting: Gastroenterology

## 2014-02-12 NOTE — Telephone Encounter (Signed)
Patient was given the results of the MRI and EEG per Dr. Brett Fairy.

## 2014-02-27 NOTE — Progress Notes (Signed)
Quick Note:  I called and spoke to wife after pt gave phone to her that doppler studies showed no major problems- normal vessels. She verbalized understanding. ______

## 2014-03-15 ENCOUNTER — Telehealth: Payer: Self-pay | Admitting: Neurology

## 2014-03-15 NOTE — Telephone Encounter (Signed)
I called the pharmacy.  Spoke with Sharyn Lull.  They will fill #15 with 0 refills for a 30 day supply.  This will cost ~$18.  I called patient back.  Spoke with spouse.  She is aware and will pick up the meds later tonight or tomorrow.

## 2014-03-15 NOTE — Telephone Encounter (Signed)
Patient's wife calling to state that patient has been taking Clonazepam once a day, and now they are completely out. When they called to request refill, pharmacy states that it is not due for refill since the 30 pills was supposed to last for 60 days. Please advise.

## 2014-03-15 NOTE — Telephone Encounter (Signed)
The Rx for Clonazepam was written for one half tablet at bedtime.   I called back.  Spoke with spouse.  She said the patient is upset about losing some of his independence, and was taking the meds on his own.  She was not aware he was taking one full tab rather than one half tab.  Says she has now taken over all of his medication to ensure it's taken properly.  She would like to know if we will auth an early refill for this med to get him back on track since he has been taking more than prescribed.  Please advise.  Thank you.

## 2014-03-15 NOTE — Telephone Encounter (Signed)
Okay to refill clonazepam 0.5 mg strength half a pill daily as previously instructed. 15 pills with no refill

## 2014-03-19 ENCOUNTER — Telehealth: Payer: Self-pay | Admitting: Neurology

## 2014-03-19 NOTE — Telephone Encounter (Signed)
Patient's wife calling to make sure we called the correct pharmacy last week as it was not ready for her and they said they had no record. They use [facility] - [phone]

## 2014-03-19 NOTE — Telephone Encounter (Signed)
I did call [facility] at [phone] last week regarding Rx and spoke with Sharyn Lull.  I called again.  Spoke with Judson Roch.  She said they do have the Rx.  States they had several floating employees at the store last week, so they put the Rx in the wrong section in error.  I called the patient back.  They are aware.

## 2014-03-30 ENCOUNTER — Other Ambulatory Visit: Payer: Self-pay

## 2014-03-30 MED ORDER — DONEPEZIL HCL 10 MG PO TABS: 10.0000 mg | ORAL_TABLET | Freq: Every day | ORAL | Status: DC

## 2014-04-02 ENCOUNTER — Ambulatory Visit: Payer: Self-pay | Admitting: Oncology

## 2014-04-02 LAB — BASIC METABOLIC PANEL
Anion Gap: 5 — ABNORMAL LOW (ref 7–16)
BUN: 14 mg/dL (ref 7–18)
Calcium, Total: 8.4 mg/dL — ABNORMAL LOW (ref 8.5–10.1)
Chloride: 108 mmol/L — ABNORMAL HIGH (ref 98–107)
Co2: 30 mmol/L (ref 21–32)
Creatinine: 1.17 mg/dL (ref 0.60–1.30)
EGFR (African American): >60
GFR CALC NON AF AMER: 59 — AB
Glucose: 66 mg/dL (ref 65–99)
Osmolality: 284 (ref 275–301)
POTASSIUM: 4.1 mmol/L (ref 3.5–5.1)
Sodium: 143 mmol/L (ref 136–145)

## 2014-04-02 LAB — CBC CANCER CENTER
BASOS ABS: 0 x10 3/mm (ref 0.0–0.1)
Basophil %: 0.1 %
EOS ABS: 0 x10 3/mm (ref 0.0–0.7)
EOS PCT: 1.2 %
HCT: 39.7 % — AB (ref 40.0–52.0)
HGB: 13.5 g/dL (ref 13.0–18.0)
LYMPHS ABS: 1.1 x10 3/mm (ref 1.0–3.6)
Lymphocyte %: 31.6 %
MCH: 34.8 pg — ABNORMAL HIGH (ref 26.0–34.0)
MCHC: 33.9 g/dL (ref 32.0–36.0)
MCV: 103 fL — ABNORMAL HIGH (ref 80–100)
MONOS PCT: 10.1 %
Monocyte #: 0.4 x10 3/mm (ref 0.2–1.0)
Neutrophil #: 2 x10 3/mm (ref 1.4–6.5)
Neutrophil %: 57 %
PLATELETS: 101 x10 3/mm — AB (ref 150–440)
RBC: 3.87 10*6/uL — ABNORMAL LOW (ref 4.40–5.90)
RDW: 13.8 % (ref 11.5–14.5)
WBC: 3.5 x10 3/mm — AB (ref 3.8–10.6)

## 2014-04-04 LAB — KAPPA/LAMBDA FREE LIGHT CHAINS (ARMC)

## 2014-04-04 LAB — PROT IMMUNOELECTROPHORES(ARMC)

## 2014-04-04 LAB — URINE IEP, RANDOM

## 2014-04-04 LAB — CEA: CEA: 1.5 ng/mL (ref 0.0–4.7)

## 2014-04-14 ENCOUNTER — Ambulatory Visit: Payer: Self-pay | Admitting: Oncology

## 2014-04-19 NOTE — Telephone Encounter (Signed)
Noted  

## 2014-04-26 ENCOUNTER — Other Ambulatory Visit: Payer: Self-pay | Admitting: Family Medicine

## 2014-05-03 ENCOUNTER — Encounter: Payer: Self-pay | Admitting: Family Medicine

## 2014-05-03 ENCOUNTER — Ambulatory Visit (INDEPENDENT_AMBULATORY_CARE_PROVIDER_SITE_OTHER): Payer: Medicare Other | Admitting: Family Medicine

## 2014-05-03 VITALS — BP 148/86 | HR 67 | Temp 98.7°F | Ht 69.0 in | Wt 232.0 lb

## 2014-05-03 DIAGNOSIS — G309 Alzheimer's disease, unspecified: Secondary | ICD-10-CM

## 2014-05-03 DIAGNOSIS — F0280 Dementia in other diseases classified elsewhere without behavioral disturbance: Secondary | ICD-10-CM

## 2014-05-03 DIAGNOSIS — Z0000 Encounter for general adult medical examination without abnormal findings: Secondary | ICD-10-CM

## 2014-05-03 DIAGNOSIS — Z23 Encounter for immunization: Secondary | ICD-10-CM

## 2014-05-03 NOTE — Progress Notes (Signed)
Pre visit review using our clinic review tool, if applicable. No additional management support is needed unless otherwise documented below in the visit note. 

## 2014-05-03 NOTE — Progress Notes (Signed)
Dr. Frederico Hamman T. Kelden Lavallee, MD Primary Care and Sports Medicine [place], [address] Phone: [phone] Fax: [phone]  05/03/2014  Patient: John Summers, MRN: [id number], DOB: [birth year]  Primary Physician:  Owens Loffler, MD  Chief Complaint: Annual Exam  Subjective:   John Summers is a 80 y.o. pleasant patient who presents for a medicare wellness examination:  Preventative Health Maintenance Visit:  Health Maintenance Summary Reviewed and updated, unless pt declines services.  Tobacco History Reviewed. Alcohol: No concerns, no excessive use Exercise Habits: none STD concerns: no risk or activity to increase risk Drug Use: None Encouraged self-testicular check  Health Maintenance  Topic Date Due  . Tetanus/tdap  10/02/1953  . Zostavax  10/02/1994  . Colonoscopy  03/08/2013  . Influenza Vaccine  04/15/2015  . Pneumococcal Polysaccharide Vaccine Age 84 And Over  Completed    Immunization History  Administered Date(s) Administered  . Influenza Split 08/20/2011  . Influenza,inj,Quad PF,36+ Mos 06/21/2013, 05/03/2014  . Pneumococcal Conjugate-13 05/03/2014  . Pneumococcal Polysaccharide-23 11/11/2011, 02/21/2012   No recent bloodwork.  Took driving away from him.  Not near the moving blades with woodworking. These are the things that are upsetting him the most.   Prevnar-13 today.  Notably demented.   Dementia / alzheimer's is worsening, and history is obtained from the patient and his wife.   Patient Active Problem List   Diagnosis Date Noted  . Alzheimer disease     Priority: High  . Dementia with behavioral disturbance 02/23/2013    Priority: High  . Colon cancer 03/19/2012    Priority: High  . Adenocarcinoma of cecum 03/19/2012    Priority: High  . Sleep apnea     Priority: Medium  . Unspecified essential hypertension     Priority: Medium  . Major depressive disorder, recurrent episode, in partial remission with  anxious distress   . REM sleep behavior disorder 12/07/2013  . Delirium, drug-induced 02/23/2013  . Incisional hernia, without obstruction or gangrene 11/14/2012  . GERD (gastroesophageal reflux disease) 03/19/2012  . Pollen allergies   . Kidney stones    Past Medical History  Diagnosis Date  . GERD (gastroesophageal reflux disease)   . Pollen allergies   . Unspecified essential hypertension   . History of colon polyps   . History of gallstones   . Complication of anesthesia     severe confusion  . Major depressive disorder, recurrent episode, in partial remission with anxious distress   . Hx of transfusion of platelets   . Alzheimers disease ~ 2002    "not severe" (12/22/2012)  . Sleep apnea     "won't wear mask anymore" (12/22/2012)  . ITP (idiopathic thrombocytopenic purpura)     Dr. Grayland Ormond follows  . Kidney stones   . Skin cancer     ears  . Colon adenocarcinoma   . Central sleep apnea   . Pancreatitis 2/11   Past Surgical History  Procedure Laterality Date  . Cholecystectomy  2011  . Tonsillectomy and adenoidectomy  1946  . Total hip arthroplasty Left ~ 2002  . Lithotripsy  2008    x2  . Pilonidal cyst excision  1960's?  Marland Kitchen Upper gastrointestinal endoscopy    . Partial colectomy  03/21/2012    Procedure: PARTIAL COLECTOMY;  Surgeon: Harl Bowie, MD;  Location: [facility];  Service: General;  Laterality: Right;  . Inguinal hernia repair Left 1601,0932  . Hernia repair  12/22/2012    IHR w/mesh  .  Esophageal dilation  1990's?  . Cardiac catheterization  2000's    "tx'd w/acid reflux RX" (12/22/2012)  . Skin cancer excision      "face; ?left ear" (12/22/2012)  . Ventral hernia repair N/A 12/22/2012    Procedure: HERNIA REPAIR INCISIONAL ;  Surgeon: Harl Bowie, MD;  Location: [facility];  Service: General;  Laterality: N/A;  . Insertion of mesh N/A 12/22/2012    Procedure: INSERTION OF MESH;  Surgeon: Harl Bowie, MD;  Location: [facility];  Service: General;   Laterality: N/A;   History   Social History  . Marital Status: Married    Spouse Name: Charlett Nose    Number of Children: 2  . Years of Education: college   Occupational History  .      Retired, Production manager, [employer]   Social History Main Topics  . Smoking status: Former Smoker    Types: Pipe  . Smokeless tobacco: Never Used     Comment: i4/06/2013 "only smoked pipe n college 1958"  . Alcohol Use: No     Comment: 12/22/2012 "might have a beer; very rarely"  . Drug Use: No  . Sexual Activity: No   Other Topics Concern  . Not on file   Social History Narrative   Patient is married Charlett Nose) and lives with his wife.   Patient has two children and his wife has two children..   Patient is retired.   Patient has a college education.   Patient is right-handed.   Patient drinks 1-2 cups of coffee daily.   Family History  Problem Relation Age of Onset  . Colon cancer Mother   . Heart disease Mother   . Hypertension Mother   . Alcohol abuse Father   . Esophageal cancer Neg Hx   . Rectal cancer Neg Hx   . Stomach cancer Neg Hx    Allergies  Allergen Reactions  . Aspirin   . Chicken Allergy     unknown    Medication list has been reviewed and updated.   General: Denies fever, chills, sweats. No significant weight loss. Eyes: Denies blurring,significant itching ENT: Denies earache, sore throat, and hoarseness. Cardiovascular: Denies chest pains, palpitations, dyspnea on exertion Respiratory: Denies cough, dyspnea at rest,wheeezing Breast: no concerns about lumps GI: Denies nausea, vomiting, diarrhea, constipation, change in bowel habits, abdominal pain, melena, hematochezia GU: Denies penile discharge, ED, urinary flow / outflow problems. No STD concerns. Musculoskeletal: Denies back pain, joint pain Derm: Denies rash, itching Neuro: Denies  paresthesias, frequent falls, frequent headaches. MEMORY CHANGES Psych: Denies depression, anxiety Endocrine: Denies  cold intolerance, heat intolerance, polydipsia Heme: Denies enlarged lymph nodes Allergy: No hayfever  Objective:   BP 148/86  Pulse 67  Temp(Src) 98.7 F (37.1 C) (Oral)  Ht 5\' 9"  (1.753 m)  Wt 232 lb (105.235 kg)  BMI 34.24 kg/m2  SpO2 98%  The patient completed a fall screen and PHQ-2 and PHQ-9 if necessary, which is documented in the EHR. The CMA/LPN/RN who assisted the patient verbally completed with them and documented results in [facility].   Hearing Screening   Method: Audiometry   125Hz  250Hz  500Hz  1000Hz  2000Hz  4000Hz  8000Hz   Right ear:   40 0 40 0   Left ear:   40 0 40 40   Vision Screening Comments: Wears Pharmacist, community at [facility] 2015.  GEN: well developed, well nourished, no acute distress Eyes: conjunctiva and lids normal, PERRLA, EOMI ENT: TM clear, nares clear,  oral exam WNL Neck: supple, no lymphadenopathy, no thyromegaly, no JVD Pulm: clear to auscultation and percussion, respiratory effort normal CV: regular rate and rhythm, S1-S2, no murmur, rub or gallop, no bruits, peripheral pulses normal and symmetric, no cyanosis, clubbing, edema or varicosities GI: soft, non-tender; no hepatosplenomegaly, masses; active bowel sounds all quadrants GU: no hernia, testicular mass, penile discharge Lymph: no cervical, axillary or inguinal adenopathy MSK: gait normal, muscle tone and strength WNL, no joint swelling, effusions, discoloration, crepitus  SKIN: clear, good turgor, color WNL, no rashes, lesions, or ulcerations Neuro: normal mental status, normal strength, sensation, and motion Psych: alert; oriented to person, place and time. FORGETFUL, REQUIRES REDIRECTION  We will obtain records from the patient's prior physicians. Dr. Gary Fleet labs.  Assessment and Plan:   Routine general medical examination at a health care facility  Need for prophylactic vaccination against Streptococcus pneumoniae (pneumococcus) - Plan: Pneumococcal conjugate  vaccine 13-valent  Need for prophylactic vaccination and inoculation against influenza - Plan: Flu Vaccine QUAD 36+ mos IM  Alzheimer disease   Health Maintenance Exam: [name] preventative maintenance and recommended screening tests for an annual wellness exam were reviewed in full today. Brought up to date unless services declined.  Counselled on the importance of diet, exercise, and its role in overall health and mortality. [name] FH and SH was reviewed, including their home life, tobacco status, and drug and alcohol status.  I have personally reviewed the Medicare Annual Wellness questionnaire and have noted 1. [name] medical and social history 2. Their use of alcohol, tobacco or illicit drugs 3. Their current medications and supplements 4. [name] functional ability including ADL's, fall risks, home safety risks and hearing or visual             impairment. 5. Diet and physical activities 6. Evidence for depression or mood disorders  The patients weight, height, BMI and visual acuity have been recorded in the chart I have made referrals, counseling and provided education to the patient based review of the above and I have provided the pt with a written personalized care plan for preventive services.  I have provided the patient with a copy of your personalized plan for preventive services. Instructed to take the time to review along with their updated medication list.  Chronic medical problems stable, memory is worsening some.   Follow-up: No Follow-up on file. Or follow-up in 1 year for complete physical examination  New Prescriptions   No medications on file   Orders Placed This Encounter  Procedures  . Flu Vaccine QUAD 36+ mos IM  . Pneumococcal conjugate vaccine 13-valent   Patient Instructions  zostavax (SHINGLES VACCINE) -- go to one of the pharmacies that has a sign that says "Shingles Vaccines" here. You can go there and they will check  with your insurance and they can give it to you there.     Signed,  Maud Deed. Arvid Marengo, MD   Patient's Medications  New Prescriptions   No medications on file  Previous Medications   CLONAZEPAM (KLONOPIN) 0.5 MG TABLET    Take 0.5 tablets (0.25 mg total) by mouth at bedtime.   DONEPEZIL (ARICEPT) 10 MG TABLET    Take 1 tablet (10 mg total) by mouth daily.   OMEPRAZOLE (PRILOSEC) 20 MG CAPSULE    Take 20 mg by mouth daily.   TRIAMTERENE-HYDROCHLOROTHIAZIDE (MAXZIDE-25) 37.5-25 MG PER TABLET    TAKE 1/2 TABLET BY MOUTH BY MOUTH ONCE DAILY  Modified Medications  No medications on file  Discontinued Medications   ESOMEPRAZOLE (NEXIUM) 20 MG CAPSULE    Take 20 mg by mouth daily at 12 noon.

## 2014-05-03 NOTE — Patient Instructions (Signed)
zostavax (SHINGLES VACCINE) -- go to one of the pharmacies that has a sign that says "Shingles Vaccines" here. You can go there and they will check with your insurance and they can give it to you there. 

## 2014-05-06 ENCOUNTER — Encounter: Payer: Self-pay | Admitting: Family Medicine

## 2014-05-06 DIAGNOSIS — G309 Alzheimer's disease, unspecified: Secondary | ICD-10-CM

## 2014-05-06 DIAGNOSIS — F3341 Major depressive disorder, recurrent, in partial remission: Secondary | ICD-10-CM | POA: Insufficient documentation

## 2014-05-06 DIAGNOSIS — F418 Other specified anxiety disorders: Secondary | ICD-10-CM

## 2014-05-06 DIAGNOSIS — F0280 Dementia in other diseases classified elsewhere without behavioral disturbance: Secondary | ICD-10-CM | POA: Insufficient documentation

## 2014-05-18 ENCOUNTER — Encounter: Payer: Self-pay | Admitting: Nurse Practitioner

## 2014-05-18 ENCOUNTER — Ambulatory Visit (INDEPENDENT_AMBULATORY_CARE_PROVIDER_SITE_OTHER): Payer: Medicare Other | Admitting: Nurse Practitioner

## 2014-05-18 ENCOUNTER — Telehealth: Payer: Self-pay | Admitting: Nurse Practitioner

## 2014-05-18 VITALS — BP 142/90 | HR 71 | Ht 69.0 in | Wt 231.0 lb

## 2014-05-18 DIAGNOSIS — G309 Alzheimer's disease, unspecified: Secondary | ICD-10-CM

## 2014-05-18 DIAGNOSIS — F0280 Dementia in other diseases classified elsewhere without behavioral disturbance: Secondary | ICD-10-CM

## 2014-05-18 DIAGNOSIS — R413 Other amnesia: Secondary | ICD-10-CM

## 2014-05-18 DIAGNOSIS — G4752 REM sleep behavior disorder: Secondary | ICD-10-CM

## 2014-05-18 MED ORDER — DONEPEZIL HCL 10 MG PO TABS: 10.0000 mg | ORAL_TABLET | Freq: Every day | ORAL | Status: DC

## 2014-05-18 MED ORDER — CLONAZEPAM 0.5 MG PO TABS: 0.2500 mg | ORAL_TABLET | Freq: Every day | ORAL | Status: DC

## 2014-05-18 NOTE — Telephone Encounter (Signed)
Spoke to wife and she is aware.

## 2014-05-18 NOTE — Patient Instructions (Signed)
Continue Aricept at current dose Continue Klonopin at current dose Followup in 6 months

## 2014-05-18 NOTE — Telephone Encounter (Signed)
Please let wife know they were called in

## 2014-05-18 NOTE — Progress Notes (Signed)
[facility]  PATIENT: John Summers DOB: 25-Mar-1933   REASON FOR VISIT: Followup for memory loss and mild obstructive sleep apnea   HISTORY OF PRESENT ILLNESS:John Summers, 80 -year-old returns for followup. He was last seen by Dr. Brett Fairy 01/15/2014. He has been followed for memory loss in the past which is progressive. He underwent a split-night polysomnography on 01-01-14. He had been excessively daytime sleepy and that frequently he also had complained of a nonrestorative sleep. In addition nocturnal diaphoresis and a feeling as if he may stagger or pass out . This has happened more frequent during the last 12 month.  The sleep study revealed an AHI of 9.8 and RDI of 10.3, not truly enough to recommend CPAP therapy. The lowest oxygen saturation was 87% for total time of 17.6 minutes. The heart rate varied between 57 and 64 beats per minute the patient was titrated that night starting at 5 cm water and the CPAP was only increased to 6 cm water. He did sleep for a long period of time at 4 cm water, this very low pressure may mean that he doesn't need CPAP at all. Dr. Brett Fairy discussed  non-CPAP option for treatment for a mild apnea.  One of the first treatment options for this patient is to avoid sleeping supine. I mentioned the tennis ball method, I also would like John. Usery to try to lose some weight, which will help. I do not think that an apnea off this rather mild degree would explain his memory loss or contribute to a major part to it.  UPDATE: 05/18/14 John Summers , 80 year old male returns for followup. He has a history of mild obstructive sleep apnea and progressive memory loss. 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 He no longer drives. He is fairly independent in his activities of daily living. He does crossword puzzles to stimulate his memory. He returns for reevaluation     REVIEW OF SYSTEMS:  Full 14 system review of systems performed and notable only for those listed, all others are neg:  Constitutional: N/A  Cardiovascular: N/A  Ear/Summers/Throat: N/A  Skin: N/A  Eyes: Allergies Respiratory: N/A  Gastroitestinal: N/A  Hematology/Lymphatic: N/A  Endocrine: N/A Musculoskeletal: Joint pain Allergy/Immunology: N/A  Neurological: Memory loss Psychiatric: N/A Sleep : Sleep talking   ALLERGIES: Allergies  Allergen Reactions  . Aspirin   . Chicken Allergy     unknown    HOME MEDICATIONS: Outpatient Prescriptions Prior to Visit  Medication Sig Dispense Refill  . clonazePAM (KLONOPIN) 0.5 MG tablet Take 0.5 tablets (0.25 mg total) by mouth at bedtime.  30 tablet  2  . donepezil (ARICEPT) 10 MG tablet Take 1 tablet (10 mg total) by mouth daily.  90 tablet  1  . omeprazole (PRILOSEC) 20 MG capsule Take 20 mg by mouth daily.      Marland Kitchen triamterene-hydrochlorothiazide (MAXZIDE-25) 37.5-25 MG per tablet TAKE 1/2 TABLET BY MOUTH BY MOUTH ONCE DAILY  45 tablet  0   No facility-administered medications prior to visit.    PAST MEDICAL HISTORY: Past Medical History  Diagnosis Date  . GERD (gastroesophageal reflux disease)   . Pollen allergies   . Unspecified essential hypertension   . History of colon polyps   . History of gallstones   . Complication of anesthesia     severe confusion  . Major depressive disorder, recurrent episode, in partial remission with anxious distress   .  Hx of transfusion of platelets   . Alzheimers disease ~ 2002    "not severe" (12/22/2012)  . Sleep apnea     "won't wear mask anymore" (12/22/2012)  . ITP (idiopathic thrombocytopenic purpura)     Dr. Grayland Ormond follows  . Kidney stones   . Skin cancer     ears  . Colon adenocarcinoma   . Central sleep apnea   . Pancreatitis 2/11    PAST SURGICAL HISTORY: Past Surgical History  Procedure Laterality Date  . Cholecystectomy  2011  . Tonsillectomy and adenoidectomy  1946  . Total hip  arthroplasty Left ~ 2002  . Lithotripsy  2008    x2  . Pilonidal cyst excision  1960's?  Marland Kitchen Upper gastrointestinal endoscopy    . Partial colectomy  03/21/2012    Procedure: PARTIAL COLECTOMY;  Surgeon: Harl Bowie, MD;  Location: [facility];  Service: General;  Laterality: Right;  . Inguinal hernia repair Left 9937,1696  . Hernia repair  12/22/2012    IHR w/mesh  . Esophageal dilation  1990's?  . Cardiac catheterization  2000's    "tx'd w/acid reflux RX" (12/22/2012)  . Skin cancer excision      "face; ?left ear" (12/22/2012)  . Ventral hernia repair N/A 12/22/2012    Procedure: HERNIA REPAIR INCISIONAL ;  Surgeon: Harl Bowie, MD;  Location: [facility];  Service: General;  Laterality: N/A;  . Insertion of mesh N/A 12/22/2012    Procedure: INSERTION OF MESH;  Surgeon: Harl Bowie, MD;  Location[facility];  Service: General;  Laterality: N/A;    FAMILY HISTORY: Family History  Problem Relation Age of Onset  . Colon cancer Mother   . Heart disease Mother   . Hypertension Mother   . Alcohol abuse Father   . Esophageal cancer Neg Hx   . Rectal cancer Neg Hx   . Stomach cancer Neg Hx     SOCIAL HISTORY: History   Social History  . Marital Status: Married    Spouse Name: John Summers    Number of Children: 2  . Years of Education: college   Occupational History  .      Retired, Production manager, [employer]   Social History Main Topics  . Smoking status: Former Smoker    Types: Pipe  . Smokeless tobacco: Never Used     Comment: i4/06/2013 "only smoked pipe n college 1958"  . Alcohol Use: No     Comment: 12/22/2012 "might have a beer; very rarely"  . Drug Use: No  . Sexual Activity: No   Other Topics Concern  . Not on file   Social History Narrative   Patient is married John Summers) and lives with his wife.   Patient has two children and his wife has two children..   Patient is retired.   Patient has a college education.   Patient is right-handed.   Patient drinks  1-2 cups of coffee daily.     PHYSICAL EXAM  Filed Vitals:   05/18/14 0907  BP: 142/90  Pulse: 71  Height: 5\' 9"  (1.753 m)  Weight: 231 lb (104.781 kg)   Body mass index is 34.1 kg/(m^2). Generalized: Well developed, obese male in no acute distress  Head: normocephalic and atraumatic,. Oropharynx benign  Neck: Supple, no carotid bruits  Cardiac: Regular rate rhythm, no murmur  Musculoskeletal: No deformity  Neurological examination  Mentation: Alert MMSE 20/30. AFT 20 Follows all commands speech and language fluent  Cranial nerve II-XII: Fundoscopic  exam without papilledema .Pupils were equal round reactive to light extraocular movements in horizontal plane with no nystagmus visual field were full on confrontational test. Facial sensation and strength were normal. hearing was decreased. Uvula tongue midline. head turning and shoulder shrug and were normal and symmetric.Tongue protrusion into cheek strength was normal.  Motor: normal bulk and tone, full strength in the BUE, BLE, fine finger movements normal, no pronator drift. No focal weakness  Coordination: finger-Summers-finger, heel-to-shin bilaterally, no dysmetria  Gait and Station: Rising up from seated position without assistance, inverted left foot when walking no assistive device can walk on heels and toes unsteady with tandem.     DIAGNOSTIC DATA (LABS, IMAGING, TESTING) -    ASSESSMENT AND PLAN  80 y.o. year old male  has a past medical history of progressive memory loss and history of sleep apnea. Recent MRI of the brain slightly abnormal showing mild age-appropriate changes with chronic microvascular ischemia and generalized cerebral atrophy. No significant changes when compared with MRI dated July 2013   Continue Aricept at current dose Continue Klonopin at current dose Followup in 6 months Dennie Bible, [facility], [facility], [facility] [address] [place], [place] [address] [phone]

## 2014-05-18 NOTE — Telephone Encounter (Signed)
Patient's wife calling back to give more information about medication, states that his clonazepam and donepezil does not have any more refills, but patient has enough donepezil to last him through the month. Please call and advise.

## 2014-05-18 NOTE — Progress Notes (Signed)
I agree with the assessment and plan as directed by NP .The patient is known to me .   Suleika Donavan, MD  

## 2014-08-07 ENCOUNTER — Encounter: Payer: Self-pay | Admitting: Neurology

## 2014-08-14 ENCOUNTER — Other Ambulatory Visit: Payer: Self-pay | Admitting: Family Medicine

## 2014-10-10 ENCOUNTER — Ambulatory Visit: Payer: Self-pay | Admitting: Oncology

## 2014-10-10 LAB — CBC CANCER CENTER
Basophil #: 0 x10 3/mm (ref 0.0–0.1)
Basophil %: 0.1 %
Eosinophil #: 0 x10 3/mm (ref 0.0–0.7)
Eosinophil %: 0.6 %
HCT: 39.2 % — AB (ref 40.0–52.0)
HGB: 13.2 g/dL (ref 13.0–18.0)
LYMPHS PCT: 33.8 %
Lymphocyte #: 0.9 x10 3/mm — ABNORMAL LOW (ref 1.0–3.6)
MCH: 34.6 pg — ABNORMAL HIGH (ref 26.0–34.0)
MCHC: 33.8 g/dL (ref 32.0–36.0)
MCV: 103 fL — ABNORMAL HIGH (ref 80–100)
MONOS PCT: 10.3 %
Monocyte #: 0.3 x10 3/mm (ref 0.2–1.0)
NEUTROS PCT: 55.2 %
Neutrophil #: 1.5 x10 3/mm (ref 1.4–6.5)
PLATELETS: 100 x10 3/mm — AB (ref 150–440)
RBC: 3.82 10*6/uL — ABNORMAL LOW (ref 4.40–5.90)
RDW: 13.1 % (ref 11.5–14.5)
WBC: 2.7 x10 3/mm — ABNORMAL LOW (ref 3.8–10.6)

## 2014-10-10 LAB — BASIC METABOLIC PANEL
Anion Gap: 8 (ref 7–16)
BUN: 23 mg/dL — ABNORMAL HIGH (ref 7–18)
CALCIUM: 8.4 mg/dL — AB (ref 8.5–10.1)
CO2: 30 mmol/L (ref 21–32)
Chloride: 106 mmol/L (ref 98–107)
Creatinine: 1.17 mg/dL (ref 0.60–1.30)
EGFR (Non-African Amer.): >60
GLUCOSE: 133 mg/dL — AB (ref 65–99)
Osmolality: 292 (ref 275–301)
Potassium: 3.9 mmol/L (ref 3.5–5.1)
SODIUM: 144 mmol/L (ref 136–145)

## 2014-10-11 LAB — PROT IMMUNOELECTROPHORES(ARMC)

## 2014-10-11 LAB — CEA: CEA: 1.4 ng/mL (ref 0.0–4.7)

## 2014-10-11 LAB — KAPPA/LAMBDA FREE LIGHT CHAINS (ARMC)

## 2014-10-12 LAB — URINE IEP, RANDOM

## 2014-10-15 ENCOUNTER — Ambulatory Visit: Payer: Self-pay | Admitting: Oncology

## 2014-11-13 ENCOUNTER — Ambulatory Visit
Admit: 2014-11-13 | Disposition: A | Discharge status: Home / Self Care | Payer: Self-pay | Attending: Oncology | Admitting: Oncology

## 2014-11-16 ENCOUNTER — Encounter: Payer: Self-pay | Admitting: Neurology

## 2014-11-16 ENCOUNTER — Ambulatory Visit (INDEPENDENT_AMBULATORY_CARE_PROVIDER_SITE_OTHER): Payer: Medicare Other | Admitting: Neurology

## 2014-11-16 ENCOUNTER — Ambulatory Visit: Payer: Medicare Other | Admitting: Nurse Practitioner

## 2014-11-16 VITALS — BP 121/76 | HR 62 | Resp 20 | Ht 70.0 in | Wt 230.0 lb

## 2014-11-16 DIAGNOSIS — F02818 Dementia in other diseases classified elsewhere, unspecified severity, with other behavioral disturbance: Secondary | ICD-10-CM | POA: Insufficient documentation

## 2014-11-16 DIAGNOSIS — F0281 Dementia in other diseases classified elsewhere with behavioral disturbance: Secondary | ICD-10-CM | POA: Diagnosis not present

## 2014-11-16 DIAGNOSIS — G3183 Dementia with Lewy bodies: Secondary | ICD-10-CM

## 2014-11-16 DIAGNOSIS — R251 Tremor, unspecified: Secondary | ICD-10-CM | POA: Diagnosis not present

## 2014-11-16 DIAGNOSIS — R2689 Other abnormalities of gait and mobility: Secondary | ICD-10-CM | POA: Diagnosis not present

## 2014-11-16 DIAGNOSIS — G478 Other sleep disorders: Secondary | ICD-10-CM

## 2014-11-16 DIAGNOSIS — G4752 REM sleep behavior disorder: Secondary | ICD-10-CM | POA: Insufficient documentation

## 2014-11-16 MED ORDER — MELATONIN 5 MG PO TABS: ORAL_TABLET | ORAL | Status: DC

## 2014-11-16 NOTE — Progress Notes (Signed)
[facility]  PATIENT: John Summers DOB: 1932-10-26   REASON FOR VISIT: Followup for memory loss and mild obstructive sleep apnea. The patient is seen today exclusively for memory loss and not CPAP follow-up. That visit is yearly.  Dr. Donella Stade is a primary care physician.   HISTORY OF PRESENT ILLNESS:John Summers, 81 -year-old , I  Have seen him last  01/15/2014. He has been followed  by nurse practitioner Cecille Rubin in December 2013 for memory loss ,  Progressive.    CD: He underwent a split-night polysomnography on 01-01-14. He had been excessively daytime sleepy and that frequently he also had complained of a nonrestorative sleep. In addition nocturnal diaphoresis and a feeling as if he may stagger or pass out . This has happened more frequent during the last 12 month.  The sleep study revealed an AHI of 9.8 and RDI of 10.3, not truly enough to recommend CPAP therapy. The lowest oxygen saturation was 87% for total time of 17.6 minutes. The heart rate varied between 57 and 64 beats per minute the patient was titrated that night starting at 5 cm water and the CPAP was only increased to 6 cm water. He did sleep for a long period of time at 4 cm water, this very low pressure may mean that he doesn't need CPAP at all. Dr. Brett Fairy discussed  non-CPAP option for treatment for a mild apnea.  One of the first treatment options for this patient is to avoid sleeping supine. I mentioned the tennis ball method, I also would like John Summers to try to lose some weight, which will help. I do not think that an apnea off this rather mild degree would explain his memory loss or contribute to a major part to it.  UPDATE: 05/18/14 [name] , 81 year old male returns for followup. He has a history of mild obstructive sleep apnea and progressive memory loss. 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  He no longer drives. He is fairly independent in his activities of daily living. He does crossword puzzles to stimulate his memory.   Interval history 11-16-14. Stimulator is seen here today for memory testing this is a established rhythm to see if his memory is stable or if there are any developments of concern. He score today on a Folstein Mini-Mental test 21 out of 30 points. The points he lost were 2 in immediate recall of 3 words. He was unable to spell backwards and only followed 1 step on a serial 7 exam. He was unsure about the correct date or the size of the building he is in currently. He was also not able to copy to pentagons. His animal fluency test revealed 12 names of animals, and he was able to do the clock drawing test just fine.  His sleep is again interrupted by vivid dreams, he giggles all nights, one night he fought an invisible intruder. He grabbed a floor lamp and thrashed it. He has a lt of drooling at night.  The patient is unaware of his acting out , his wife cannot get rest, he is unwilling to increase the dose of Klonopin as he sees no problem. He falls asleep in daytime easily. He reports feeling as if he hadn't slept at all . His short term memory is progressivly impaired.  He is stumbling off on on , has hip  pain. The Klonopin may contribute to stumbling if taken.  He has been shuffling , He has visual hallucinations, he has seen his sister in the age of 5-6 in the living room , has trouble to recognize his grandchildren and believes they are in the house when they are not.   He has Lewy body dementia.  His wife has noticed a significant variability of his cognitive performance from day to day. This also is a typical feature of Lewy body dementia.    REVIEW OF SYSTEMS: Full 14 system review of systems performed and notable only for those listed, all others are neg:  Neurological: Memory loss Sleep talking REM behaviour , joint pain.   ALLERGIES: Allergies  Allergen  Reactions  . Aspirin   . Chicken Allergy     unknown    HOME MEDICATIONS: Outpatient Prescriptions Prior to Visit  Medication Sig Dispense Refill  . clonazePAM (KLONOPIN) 0.5 MG tablet Take 0.5 tablets (0.25 mg total) by mouth at bedtime. 30 tablet 5  . donepezil (ARICEPT) 10 MG tablet Take 1 tablet (10 mg total) by mouth daily. 90 tablet 1  . omeprazole (PRILOSEC) 20 MG capsule Take 20 mg by mouth daily.    Marland Kitchen triamterene-hydrochlorothiazide (MAXZIDE-25) 37.5-25 MG per tablet TAKE 1/2 TABLET BY MOUTH ONCE DAILY 45 tablet 1   No facility-administered medications prior to visit.    PAST MEDICAL HISTORY: Past Medical History  Diagnosis Date  . GERD (gastroesophageal reflux disease)   . Pollen allergies   . Unspecified essential hypertension   . History of colon polyps   . History of gallstones   . Complication of anesthesia     severe confusion  . Major depressive disorder, recurrent episode, in partial remission with anxious distress   . Hx of transfusion of platelets   . Alzheimers disease ~ 2002    "not severe" (12/22/2012)  . Sleep apnea     "won't wear mask anymore" (12/22/2012)  . ITP (idiopathic thrombocytopenic purpura)     Dr. Grayland Ormond follows  . Kidney stones   . Skin cancer     ears  . Colon adenocarcinoma   . Central sleep apnea   . Pancreatitis 2/11  . Memory loss     PAST SURGICAL HISTORY: Past Surgical History  Procedure Laterality Date  . Cholecystectomy  2011  . Tonsillectomy and adenoidectomy  1946  . Total hip arthroplasty Left ~ 2002  . Lithotripsy  2008    x2  . Pilonidal cyst excision  1960's?  Marland Kitchen Upper gastrointestinal endoscopy    . Partial colectomy  03/21/2012    Procedure: PARTIAL COLECTOMY;  Surgeon: Harl Bowie, MD;  Location: [place];  Service: General;  Laterality: Right;  . Inguinal hernia repair Left [id number],7893  . Hernia repair  12/22/2012    IHR w/mesh  . Esophageal dilation  1990's?  . Cardiac catheterization  2000's    "tx'd  w/acid reflux RX" (12/22/2012)  . Skin cancer excision      "face; ?left ear" (12/22/2012)  . Ventral hernia repair N/A 12/22/2012    Procedure: HERNIA REPAIR INCISIONAL ;  Surgeon: Harl Bowie, MD;  Location: [place];  Service: General;  Laterality: N/A;  . Insertion of mesh N/A 12/22/2012    Procedure: INSERTION OF MESH;  Surgeon: Harl Bowie, MD;  Location: [facility];  Service: General;  Laterality: N/A;    FAMILY HISTORY: Family History  Problem Relation Age of Onset  . Colon cancer Mother   .  Heart disease Mother   . Hypertension Mother   . Alcohol abuse Father   . Esophageal cancer Neg Hx   . Rectal cancer Neg Hx   . Stomach cancer Neg Hx     SOCIAL HISTORY: History   Social History  . Marital Status: Married    Spouse Name: Charlett Nose  . Number of Children: 2  . Years of Education: college   Occupational History  .      Retired, Production manager, [employer]   Social History Main Topics  . Smoking status: Former Smoker    Types: Pipe  . Smokeless tobacco: Never Used     Comment: i4/06/2013 "only smoked pipe n college 1958"  . Alcohol Use: No  . Drug Use: No  . Sexual Activity: No   Other Topics Concern  . Not on file   Social History Narrative   Patient is married Charlett Nose) and lives with his wife.   Patient has two children and his wife has two children..   Patient is retired.   Patient has a college education.   Patient is right-handed.   Patient drinks 1-2 cups of coffee daily.     PHYSICAL EXAM  Filed Vitals:   11/16/14 1104  BP: 121/76  Pulse: 62  Resp: 20  Height: 5\' 10"  (1.778 m)  Weight: 230 lb (104.327 kg)   Body mass index is 33 kg/(m^2). Generalized: Well developed, obese male in no acute distress  Head: normocephalic and atraumatic,. Oropharynx benign  Neck: Supple, no carotid bruits  Cardiac: Regular rate rhythm, no murmur  Musculoskeletal: No deformity  Neurological examination  Mentation: Alert , but grumpy- he is very  unahappy that he cannot follow the conversation as he used to.  MMSE  Now 17-30 from 20/30 points last visit , only 3 month ago. Marland Kitchen AFT 12 from 20,  Follows all commands after repeated instruction. speech and language fluent  Cranial nerve II-XII: Fundoscopic exam without papilledema .Pupils were equal round reactive to light extraocular movements in horizontal plane with no nystagmus visual field were full on confrontational test. Facial sensation and strength were normal. hearing was decreased.  Uvula tongue midline. head turning and shoulder shrug and were normal and symmetric.Tongue protrusion into cheek strength was normal.  Motor: mild parkinsonism . Over the left biceps.   full strength in the extremities , fine finger movements normal, no pronator drift. No focal weakness  Coordination: finger-nose-finger, heel-to-shin bilaterally, no dysmetria  Gait and Station: Rising up from seated position without assistance,  Shuffling, inverted left foot when walking-  no assistive device.  can walk on heels and toes , he is unable to perform .   His wife made me aware that he does use his shoes unevenly and there is indeed lots of wear and tear on his medial edge of the right  foot.     ASSESSMENT AND PLAN  81 y.o. year old male  has a past medical history of progressive memory loss and REM behaviour disorder, Given the other explained symtpoms , he suffers form LEWY body dementia.   history of sleep apnea.  Last MRI of the brain  2015 , slightly abnormal showing mild age-appropriate changes with chronic microvascular ischemia and generalized cerebral atrophy. No significant changes when compared with MRI dated July 2013  I will ask Mr. Mueller to start taking melatonin at night which will help to suppress dreams stages and hopefully makes it less likely for him to have REM behavior  at night. His REM behavior does not bother him but does interrupt his wife sleep. I would like to keep him on the  current dose of Klonopin instead of increasing it. These 2 medications can be combined. As to his memory loss and did their day-to-day variation I think that he does not have Alzheimer's disease but Lewy body dementia ,  progressing.   Continue Aricept at current dose Continue Klonopin at current dose Followup in 6 months with  Cecille Rubin, [facility] [place].67minute visit.  Dear Hoyle Sauer,  please always right in the level of service note that the patient needs a 30 minute revisit for memory evaluation !!!! Thank You.  [facility] [address] [place], [place] [address] [phone]

## 2014-11-16 NOTE — Patient Instructions (Signed)
Dementia With Lewy Bodies Dementia with Lewy bodies is a common type of dementia that gets progressively worse with time. Typical characteristics include progressive worsening of mental function combined with 3 other features: seeing things that are not there (hallucinations), significant fluctuations in alertness and attention, and changes in movement similar to Parkinson's disease (rigid muscles, slowed movement, and tremors). Dementia with Lewy bodies has many similar symptoms that Parkinson's disease and Alzheimer's disease has. CAUSES The symptoms of dementia with Lewy bodies are caused by the buildup of Lewy bodies, which are proteins that build up in brain cells and affect mental function and movement. No one knows exactly what causes the buildup of Lewy bodies, but it may be related to Alzheimer's dementia or Parkinson's disease.  SYMPTOMS   Memory problems.  Confusion.  Reduced attention span.  Hallucinations.  False ideas about another person or situation (delusions).  Trouble moving (rigid muscles, slowed movement, and tremors).  Shuffling movements (gait).  Sleep problems (acting out dreams).  Fluctuating attention (periods of drowsiness or lethargy, long periods of time spent staring into space, or disorganized speech). DIAGNOSIS There is no specific test to diagnose dementia with Lewy bodies, but your caregiver will evaluate you based on your symptoms and physical exam. Your evaluation may also include:  Memory testing.  Lab tests.  Brain imaging (CT scan, MRI).  Electroencephalogram (EEG).  Lumbar puncture. TREATMENT  There is no cure for dementia with Lewy bodies. Medicines may be prescribed to help with mental function and movement.  HOME CARE INSTRUCTIONS The care of individuals with dementia is varied and dependent upon the progression of the dementia. The following suggestions are intended for the person living with, or caring for, the person with  dementia.  Create a safe environment.  Remove the locks on bathroom doors to prevent the person from accidentally locking himself or herself in.  Use childproof latches on kitchen cabinets and any place where cleaning supplies, chemicals, or alcohol are kept.  Use childproof covers in unused electrical outlets.  Install childproof devices to keep doors and windows secured.  Remove stove knobs or install safety knobs and an automatic shut-off on the stove.  Lower the temperature on water heaters.  Label medicines and keep them locked up.  Secure knives, lighters, matches, power tools, and guns, and keep these items out of reach.  Keep the house free from clutter. Remove rugs or anything that might contribute to a fall.  Remove objects that might break and hurt the person.  Make sure lighting is good, both inside and outside.  Install grab rails as needed.  Use a monitoring device to alert you to falls or other needs for help.  Reduce confusion.  Keep familiar objects and people around.  Use night lights or dim lights at night.  Label items or areas.  Use reminders, notes, or directions for daily activities or tasks.  Keep a simple, consistent routine for waking, meals, bathing, dressing, and bedtime.  Create a calm, quiet environment.  Place large clocks and calendars prominently.  Display emergency numbers and the home address near all telephones.  Use cues to establish different times of the day. An example is to open curtains to let the natural light in during the day.  Use effective communication.  Choose simple words and short sentences.  Use a gentle, calm tone of voice.  Be careful not to interrupt.  If the person is struggling to find a word or communicate a thought, try to provide the  word or thought.  Ask one question at a time. Allow the person ample time to answer questions. Repeat the question again if the person does not respond.  Reduce  nighttime restlessness.  Provide a comfortable bed.  Have a consistent nighttime routine.  Ensure a regular walking or physical activity schedule. Involve the person in daily activities as much as possible.  Limit napping during the day.  Limit caffeine.  Attend social events that stimulate rather than overwhelm the senses.  Encourage good nutrition and hydration.  Reduce distractions during meal times and snacks.  Avoid foods that are too hot or too cold.  Monitor chewing and swallowing ability.  Continue with routine vision, hearing, dental, and medical screenings.  Only give over-the-counter or prescription medicines as directed by the caregiver.  Monitor driving abilities. Do not allow the person to drive when safe driving is no longer possible.  Register with an identification program which could provide location assistance in the event of a missing person situation. SEEK MEDICAL CARE IF:  New behavioral problems develop, such as moodiness or aggressiveness.  Any new problem with brain function develop, such as balance, speech, or falling a lot.  Problems with swallowing develop. Small changes or worsening in any aspect of brain function can be a sign that the illness is getting worse. It can also be a sign of another medical illness such as infection. Seeing a caregiver right away is important.  SEEK IMMEDIATE MEDICAL CARE IF:  A fever develops.  Confusion develops or worsens.  New or worsened sleepiness develops or staying awake becomes difficult. Document Released: 05/23/2002 Document Revised: 11/23/2011 Document Reviewed: 04/27/2011 [facility] Patient Information 2015 [place], [place]. This information is not intended to replace advice given to you by your health care provider. Make sure you discuss any questions you have with your health care provider.

## 2014-12-08 ENCOUNTER — Other Ambulatory Visit: Payer: Self-pay | Admitting: Nurse Practitioner

## 2014-12-10 NOTE — Telephone Encounter (Signed)
[facility] faxed Rx

## 2015-02-05 ENCOUNTER — Other Ambulatory Visit: Payer: Self-pay | Admitting: Family Medicine

## 2015-03-27 ENCOUNTER — Other Ambulatory Visit: Payer: Self-pay | Admitting: Nurse Practitioner

## 2015-04-15 ENCOUNTER — Inpatient Hospital Stay: Payer: Medicare Other | Attending: Oncology

## 2015-04-15 DIAGNOSIS — K219 Gastro-esophageal reflux disease without esophagitis: Secondary | ICD-10-CM | POA: Insufficient documentation

## 2015-04-15 DIAGNOSIS — Z800 Family history of malignant neoplasm of digestive organs: Secondary | ICD-10-CM | POA: Insufficient documentation

## 2015-04-15 DIAGNOSIS — G4730 Sleep apnea, unspecified: Secondary | ICD-10-CM | POA: Insufficient documentation

## 2015-04-15 DIAGNOSIS — D696 Thrombocytopenia, unspecified: Secondary | ICD-10-CM | POA: Insufficient documentation

## 2015-04-15 DIAGNOSIS — G309 Alzheimer's disease, unspecified: Secondary | ICD-10-CM | POA: Insufficient documentation

## 2015-04-15 DIAGNOSIS — Z85828 Personal history of other malignant neoplasm of skin: Secondary | ICD-10-CM | POA: Insufficient documentation

## 2015-04-15 DIAGNOSIS — Z85038 Personal history of other malignant neoplasm of large intestine: Secondary | ICD-10-CM | POA: Insufficient documentation

## 2015-04-15 DIAGNOSIS — D72819 Decreased white blood cell count, unspecified: Secondary | ICD-10-CM | POA: Insufficient documentation

## 2015-04-15 DIAGNOSIS — F329 Major depressive disorder, single episode, unspecified: Secondary | ICD-10-CM | POA: Insufficient documentation

## 2015-04-15 DIAGNOSIS — Z87891 Personal history of nicotine dependence: Secondary | ICD-10-CM | POA: Insufficient documentation

## 2015-04-15 DIAGNOSIS — Z87442 Personal history of urinary calculi: Secondary | ICD-10-CM | POA: Insufficient documentation

## 2015-04-15 DIAGNOSIS — I10 Essential (primary) hypertension: Secondary | ICD-10-CM | POA: Insufficient documentation

## 2015-04-15 DIAGNOSIS — D472 Monoclonal gammopathy: Secondary | ICD-10-CM | POA: Insufficient documentation

## 2015-04-15 DIAGNOSIS — Z79899 Other long term (current) drug therapy: Secondary | ICD-10-CM | POA: Insufficient documentation

## 2015-04-15 DIAGNOSIS — D693 Immune thrombocytopenic purpura: Secondary | ICD-10-CM | POA: Insufficient documentation

## 2015-04-15 DIAGNOSIS — Z86010 Personal history of colonic polyps: Secondary | ICD-10-CM | POA: Insufficient documentation

## 2015-04-22 ENCOUNTER — Inpatient Hospital Stay: Payer: Medicare Other | Admitting: Oncology

## 2015-05-01 ENCOUNTER — Encounter: Payer: Self-pay | Admitting: Adult Health

## 2015-05-01 ENCOUNTER — Ambulatory Visit (INDEPENDENT_AMBULATORY_CARE_PROVIDER_SITE_OTHER): Payer: Medicare Other | Admitting: Adult Health

## 2015-05-01 VITALS — BP 110/65 | HR 59 | Ht 71.0 in | Wt 230.0 lb

## 2015-05-01 DIAGNOSIS — G3183 Dementia with Lewy bodies: Secondary | ICD-10-CM | POA: Diagnosis not present

## 2015-05-01 DIAGNOSIS — F0281 Dementia in other diseases classified elsewhere with behavioral disturbance: Secondary | ICD-10-CM

## 2015-05-01 DIAGNOSIS — F02818 Dementia in other diseases classified elsewhere, unspecified severity, with other behavioral disturbance: Secondary | ICD-10-CM

## 2015-05-01 NOTE — Progress Notes (Addendum)
PATIENT: John Summers DOB: [birth year]  REASON FOR VISIT: follow up- possible Lewy body dementia HISTORY FROM: patient  HISTORY OF PRESENT ILLNESS: John Summers is an 81 year old male with a history of possible Lewy body dementia. He returns today for an evaluation. The patient is on Aricept 10 mg daily. He is tolerating this medication well. The patient also takes Klonopin 1/2 tablet at bedtime for REM sleep behavior disorder. At the last visit the patient was asked to start melatonin 5 mg in conjunction to the Klonopin. Wife reports that she could not remember the name of this medication and therefore they did not start it. The patient feels that his memory has gotten worse. He is able to complete all ADLs independently. He reports he does have trouble dressing but this is due to his balance. The patient has had 2 falls since the last visit. The patient does not operate a motor vehicle. His wife now completes all the finances. He does report visual hallucinations. His hallucinations primarily consist of people. He reports that the hallucinations are not violent or fearful. They normally take place during the day rarely at night. The wife feels that his sleep is slightly better. He has been more calmer when he sleeps. The wife feels that he may be depressed. She states that he typically he has no motivation to do most things. She has encouraged him to complete basic needs such as bathing. Patient states that he does not want to start any medication for this. Denies any new neurological symptoms. He returns today for an evaluation.  HISTORY [facility]):  John Summers, 23 -year-old , I Have seen him last 01/15/2014. He has been followed by nurse practitioner Cecille Rubin in December 2013 for memory loss , Progressive.   CD: He underwent a split-night polysomnography on 01-01-14. He had been excessively daytime sleepy and that frequently he also had complained of a nonrestorative sleep. In  addition nocturnal diaphoresis and a feeling as if he may stagger or pass out . This has happened more frequent during the last 12 month.  The sleep study revealed an AHI of 9.8 and RDI of 10.3, not truly enough to recommend CPAP therapy. The lowest oxygen saturation was 87% for total time of 17.6 minutes. The heart rate varied between 57 and 64 beats per minute the patient was titrated that night starting at 5 cm water and the CPAP was only increased to 6 cm water. He did sleep for a long period of time at 4 cm water, this very low pressure may mean that he doesn't need CPAP at all. Dr. Brett Fairy discussed non-CPAP option for treatment for a mild apnea.  One of the first treatment options for this patient is to avoid sleeping supine. I mentioned the tennis ball method, I also would like John Summers to try to lose some weight, which will help. I do not think that an apnea off this rather mild degree would explain his memory loss or contribute to a major part to it.  UPDATE: 05/18/14 John Summers , 81 year old male returns for followup. He has a history of mild obstructive sleep apnea and progressive memory loss. He is currently on Aricept 10 mg daily without side effects and Klonopin for his REM sleep disorder. He no longer drives. He is fairly independent in his activities of daily living. He does crossword puzzles to stimulate his memory.   Interval history 11-16-14. Stimulator is seen here today for memory testing this is a established  rhythm to see if his memory is stable or if there are any developments of concern. He score today on a Folstein Mini-Mental test 21 out of 30 points. The points he lost were 2 in immediate recall of 3 words. He was unable to spell backwards and only followed 1 step on a serial 7 exam. He was unsure about the correct date or the size of the building he is in currently. He was also not able to copy to pentagons. His animal fluency test revealed 12 names of animals, and he was  able to do the clock drawing test just fine.  His sleep is again interrupted by vivid dreams, he giggles all nights, one night he fought an invisible intruder. He grabbed a floor lamp and thrashed it. He has a lt of drooling at night.  The patient is unaware of his acting out , his wife cannot get rest, he is unwilling to increase the dose of Klonopin as he sees no problem. He falls asleep in daytime easily. He reports feeling as if he hadn't slept at all . His short term memory is progressivly impaired.  He is stumbling off on on , has hip pain. The Klonopin may contribute to stumbling if taken. He has been shuffling , He has visual hallucinations, he has seen his sister in the age of 5-6 in the living room , has trouble to recognize his grandchildren and believes they are in the house when they are not.  He has Lewy body dementia. His wife has noticed a significant variability of his cognitive performance from day to day. This also is a typical feature of Lewy body dementia   REVIEW OF SYSTEMS: Out of a complete 14 system review of symptoms, the patient complains only of the following symptoms, and all other reviewed systems are negative.  Fatigue, hearing loss, eye itching, shortness of breath, diarrhea, swollen abdomen, snoring, joint pain, food allergies, memory loss, dizziness, tremors, confusion, depression, nervous/anxious, hallucinations  ALLERGIES: Allergies  Allergen Reactions  . Aspirin   . Chicken Allergy     unknown    HOME MEDICATIONS: Outpatient Prescriptions Prior to Visit  Medication Sig Dispense Refill  . clonazePAM (KLONOPIN) 0.5 MG tablet TAKE 1/2 TABLET BY MOUTH AT BEDTIME AS DIRECTED 45 tablet 1  . donepezil (ARICEPT) 10 MG tablet take 1 tablet by mouth once daily 90 tablet 0  . Melatonin 5 MG TABS Prn at night .  0  . omeprazole (PRILOSEC) 20 MG capsule Take 20 mg by mouth daily.    Marland Kitchen triamterene-hydrochlorothiazide (MAXZIDE-25) 37.5-25 MG per tablet take 1/2  tablet by mouth once daily 45 tablet 0   No facility-administered medications prior to visit.    PAST MEDICAL HISTORY: Past Medical History  Diagnosis Date  . GERD (gastroesophageal reflux disease)   . Pollen allergies   . Unspecified essential hypertension   . History of colon polyps   . History of gallstones   . Complication of anesthesia     severe confusion  . Major depressive disorder, recurrent episode, in partial remission with anxious distress   . Hx of transfusion of platelets   . Alzheimers disease ~ 2002    "not severe" (12/22/2012)  . Sleep apnea     "won't wear mask anymore" (12/22/2012)  . ITP (idiopathic thrombocytopenic purpura)     Dr. Grayland Ormond follows  . Kidney stones   . Skin cancer     ears  . Colon adenocarcinoma   . Central sleep  apnea   . Pancreatitis 2/11  . Memory loss     PAST SURGICAL HISTORY: Past Surgical History  Procedure Laterality Date  . Cholecystectomy  2011  . Tonsillectomy and adenoidectomy  1946  . Total hip arthroplasty Left ~ 2002  . Lithotripsy  2008    x2  . Pilonidal cyst excision  1960's?  Marland Kitchen Upper gastrointestinal endoscopy    . Partial colectomy  03/21/2012    Procedure: PARTIAL COLECTOMY;  Surgeon: Harl Bowie, MD;  Location: [place];  Service: General;  Laterality: Right;  . Inguinal hernia repair Left [id number]  . Hernia repair  12/22/2012    IHR w/mesh  . Esophageal dilation  1990's?  . Cardiac catheterization  2000's    "tx'd w/acid reflux RX" (12/22/2012)  . Skin cancer excision      "face; ?left ear" (12/22/2012)  . Ventral hernia repair N/A 12/22/2012    Procedure: HERNIA REPAIR INCISIONAL ;  Surgeon: Harl Bowie, MD;  Location: [place];  Service: General;  Laterality: N/A;  . Insertion of mesh N/A 12/22/2012    Procedure: INSERTION OF MESH;  Surgeon: Harl Bowie, MD;  Location: [facility];  Service: General;  Laterality: N/A;    FAMILY HISTORY: Family History  Problem Relation Age of Onset  . Colon  cancer Mother   . Heart disease Mother   . Hypertension Mother   . Alcohol abuse Father   . Esophageal cancer Neg Hx   . Rectal cancer Neg Hx   . Stomach cancer Neg Hx     SOCIAL HISTORY: Social History   Social History  . Marital Status: Married    Spouse Name: Charlett Nose  . Number of Children: 2  . Years of Education: college   Occupational History  .      Retired, Production manager, [employer]   Social History Main Topics  . Smoking status: Former Smoker    Types: Pipe  . Smokeless tobacco: Never Used     Comment: i4/06/2013 "only smoked pipe n college 1958"  . Alcohol Use: No  . Drug Use: No  . Sexual Activity: No   Other Topics Concern  . Not on file   Social History Narrative   Patient is married Charlett Nose) and lives with his wife.   Patient has two children and his wife has two children..   Patient is retired.   Patient has a college education.   Patient is right-handed.   Patient drinks 1-2 cups of coffee daily.      PHYSICAL EXAM  Filed Vitals:   05/01/15 1429  BP: 115/62  Pulse: 59   There is no weight on file to calculate BMI.  MMSE - Mini Mental State Exam 05/01/2015 11/16/2014 05/18/2014  Orientation to time 1 5 3   Orientation to Place 2 4 4   Registration 3 3 3   Attention/ Calculation 0 0 1  Recall 3 1 0  Language- name 2 objects 2 2 2   Language- repeat 1 1 1   Language- follow 3 step command 3 3 3   Language- read & follow direction 1 1 1   Write a sentence 0 1 1  Copy design 1 0 1  Total score 17 21 20        Generalized: Well developed, in no acute distress   Neurological examination  Mentation: Alert. Follows all commands speech and language fluent. MMSE 17/30 Cranial nerve II-XII: Extraocular movements were full, visual field were full on confrontational test. Facial sensation and strength  were normal. Uvula tongue midline. Head turning and shoulder shrug  were normal and symmetric. Motor: The motor testing reveals 5 over 5 strength  of all 4 extremities. Good symmetric motor tone is noted throughout.  Sensory: Sensory testing is intact to soft touch on all 4 extremities. No evidence of extinction is noted.  Coordination: Cerebellar testing reveals good finger-nose-finger and heel-to-shin bilaterally.  Gait and station: Patient able to stand from a seated position without assistance. Patient does have a shuffling gait with a slightly stooped posture. Arm swing is decreased bilaterally. Tandem gait not attempted. Reflexes: Deep tendon reflexes are symmetric and normal bilaterally.   DIAGNOSTIC DATA (LABS, IMAGING, TESTING) - I reviewed patient records, labs, notes, testing and imaging myself where available.      ASSESSMENT AND PLAN 81 y.o. year old male  has a past medical history of GERD (gastroesophageal reflux disease); Pollen allergies; Unspecified essential hypertension; History of colon polyps; History of gallstones; Complication of anesthesia; Major depressive disorder, recurrent episode, in partial remission with anxious distress; transfusion of platelets; Alzheimers disease (~ 2002); Sleep apnea; ITP (idiopathic thrombocytopenic purpura); Kidney stones; Skin cancer; Colon adenocarcinoma; Central sleep apnea; Pancreatitis (2/11); and Memory loss. here with:  1. Dementia with Lewy bodies  [name] memory score has remained stable. His MMSE today is 17/30 was previously 17/30. He will continue on Aricept 10 mg daily. Patient will also continue Klonopin at bedtime. I have provided the patient and his wife with a printout with melatonin listed if they desire to try this in the future. The patient would benefit from a cane or walker when ambulating to help prevent falls. However the patient does not seem amenable to this. [name] wife mentioned that he may be depressed. However the patient does not want to start any medication for this. For now the patient will stay on Aricept and Klonopin. We will continue to  monitor his memory. Patient and his wife advised that if his symptoms worsen or he develops new symptoms he should let us know. Otherwise he will follow-up in 6 months or sooner if needed.  I spent 25 minutes with the patient 50% of this time was spent counseling the patient and his wife on his diagnosis an associated symptoms.  Ward Givens, MSN, NP-C 05/01/2015, 2:34 PM [facility] [address] [place], [place] [address] [phone]

## 2015-05-01 NOTE — Patient Instructions (Signed)
Continue Aricept. Continue Klonopin Melatonin 5 mg at bedtime Memory is stable. If your symptoms worsen or you develop new symptoms please let us know.   Dementia With Lewy Bodies Dementia with Lewy bodies is a common type of dementia that gets progressively worse with time. Typical characteristics include progressive worsening of mental function combined with 3 other features: seeing things that are not there (hallucinations), significant fluctuations in alertness and attention, and changes in movement similar to Parkinson's disease (rigid muscles, slowed movement, and tremors). Dementia with Lewy bodies has many similar symptoms that Parkinson's disease and Alzheimer's disease has. CAUSES The symptoms of dementia with Lewy bodies are caused by the buildup of Lewy bodies, which are proteins that build up in brain cells and affect mental function and movement. No one knows exactly what causes the buildup of Lewy bodies, but it may be related to Alzheimer's dementia or Parkinson's disease.  SYMPTOMS   Memory problems.  Confusion.  Reduced attention span.  Hallucinations.  False ideas about another person or situation (delusions).  Trouble moving (rigid muscles, slowed movement, and tremors).  Shuffling movements (gait).  Sleep problems (acting out dreams).  Fluctuating attention (periods of drowsiness or lethargy, long periods of time spent staring into space, or disorganized speech). DIAGNOSIS There is no specific test to diagnose dementia with Lewy bodies, but your caregiver will evaluate you based on your symptoms and physical exam. Your evaluation may also include:  Memory testing.  Lab tests.  Brain imaging (CT scan, MRI).  Electroencephalogram (EEG).  Lumbar puncture. TREATMENT  There is no cure for dementia with Lewy bodies. Medicines may be prescribed to help with mental function and movement.  HOME CARE INSTRUCTIONS The care of individuals with dementia is  varied and dependent upon the progression of the dementia. The following suggestions are intended for the person living with, or caring for, the person with dementia.  Create a safe environment.  Remove the locks on bathroom doors to prevent the person from accidentally locking himself or herself in.  Use childproof latches on kitchen cabinets and any place where cleaning supplies, chemicals, or alcohol are kept.  Use childproof covers in unused electrical outlets.  Install childproof devices to keep doors and windows secured.  Remove stove knobs or install safety knobs and an automatic shut-off on the stove.  Lower the temperature on water heaters.  Label medicines and keep them locked up.  Secure knives, lighters, matches, power tools, and guns, and keep these items out of reach.  Keep the house free from clutter. Remove rugs or anything that might contribute to a fall.  Remove objects that might break and hurt the person.  Make sure lighting is good, both inside and outside.  Install grab rails as needed.  Use a monitoring device to alert you to falls or other needs for help.  Reduce confusion.  Keep familiar objects and people around.  Use night lights or dim lights at night.  Label items or areas.  Use reminders, notes, or directions for daily activities or tasks.  Keep a simple, consistent routine for waking, meals, bathing, dressing, and bedtime.  Create a calm, quiet environment.  Place large clocks and calendars prominently.  Display emergency numbers and the home address near all telephones.  Use cues to establish different times of the day. An example is to open curtains to let the natural light in during the day.  Use effective communication.  Choose simple words and short sentences.  Use a gentle, calm  tone of voice.  Be careful not to interrupt.  If the person is struggling to find a word or communicate a thought, try to provide the word or  thought.  Ask one question at a time. Allow the person ample time to answer questions. Repeat the question again if the person does not respond.  Reduce nighttime restlessness.  Provide a comfortable bed.  Have a consistent nighttime routine.  Ensure a regular walking or physical activity schedule. Involve the person in daily activities as much as possible.  Limit napping during the day.  Limit caffeine.  Attend social events that stimulate rather than overwhelm the senses.  Encourage good nutrition and hydration.  Reduce distractions during meal times and snacks.  Avoid foods that are too hot or too cold.  Monitor chewing and swallowing ability.  Continue with routine vision, hearing, dental, and medical screenings.  Only give over-the-counter or prescription medicines as directed by the caregiver.  Monitor driving abilities. Do not allow the person to drive when safe driving is no longer possible.  Register with an identification program which could provide location assistance in the event of a missing person situation. SEEK MEDICAL CARE IF:  New behavioral problems develop, such as moodiness or aggressiveness.  Any new problem with brain function develop, such as balance, speech, or falling a lot.  Problems with swallowing develop. Small changes or worsening in any aspect of brain function can be a sign that the illness is getting worse. It can also be a sign of another medical illness such as infection. Seeing a caregiver right away is important.  SEEK IMMEDIATE MEDICAL CARE IF:  A fever develops.  Confusion develops or worsens.  New or worsened sleepiness develops or staying awake becomes difficult.

## 2015-05-02 NOTE — Progress Notes (Signed)
I agree with the assessment and plan as directed by NP .The patient is known to me .   Merlyn Conley, MD  

## 2015-05-08 ENCOUNTER — Other Ambulatory Visit: Payer: Self-pay | Admitting: *Deleted

## 2015-05-08 DIAGNOSIS — C189 Malignant neoplasm of colon, unspecified: Secondary | ICD-10-CM

## 2015-05-09 ENCOUNTER — Inpatient Hospital Stay: Payer: Medicare Other

## 2015-05-10 ENCOUNTER — Inpatient Hospital Stay: Payer: Medicare Other

## 2015-05-10 DIAGNOSIS — Z86010 Personal history of colonic polyps: Secondary | ICD-10-CM | POA: Diagnosis not present

## 2015-05-10 DIAGNOSIS — G309 Alzheimer's disease, unspecified: Secondary | ICD-10-CM | POA: Diagnosis not present

## 2015-05-10 DIAGNOSIS — Z79899 Other long term (current) drug therapy: Secondary | ICD-10-CM | POA: Diagnosis not present

## 2015-05-10 DIAGNOSIS — D693 Immune thrombocytopenic purpura: Secondary | ICD-10-CM | POA: Diagnosis not present

## 2015-05-10 DIAGNOSIS — C189 Malignant neoplasm of colon, unspecified: Secondary | ICD-10-CM

## 2015-05-10 DIAGNOSIS — K219 Gastro-esophageal reflux disease without esophagitis: Secondary | ICD-10-CM | POA: Diagnosis not present

## 2015-05-10 DIAGNOSIS — I10 Essential (primary) hypertension: Secondary | ICD-10-CM | POA: Diagnosis not present

## 2015-05-10 DIAGNOSIS — F329 Major depressive disorder, single episode, unspecified: Secondary | ICD-10-CM | POA: Diagnosis not present

## 2015-05-10 DIAGNOSIS — Z85828 Personal history of other malignant neoplasm of skin: Secondary | ICD-10-CM | POA: Diagnosis not present

## 2015-05-10 DIAGNOSIS — Z87891 Personal history of nicotine dependence: Secondary | ICD-10-CM | POA: Diagnosis not present

## 2015-05-10 DIAGNOSIS — D72819 Decreased white blood cell count, unspecified: Secondary | ICD-10-CM | POA: Diagnosis not present

## 2015-05-10 DIAGNOSIS — Z87442 Personal history of urinary calculi: Secondary | ICD-10-CM | POA: Diagnosis not present

## 2015-05-10 DIAGNOSIS — D472 Monoclonal gammopathy: Secondary | ICD-10-CM | POA: Diagnosis not present

## 2015-05-10 DIAGNOSIS — Z85038 Personal history of other malignant neoplasm of large intestine: Secondary | ICD-10-CM | POA: Diagnosis not present

## 2015-05-10 DIAGNOSIS — D696 Thrombocytopenia, unspecified: Secondary | ICD-10-CM | POA: Diagnosis not present

## 2015-05-10 DIAGNOSIS — Z800 Family history of malignant neoplasm of digestive organs: Secondary | ICD-10-CM | POA: Diagnosis not present

## 2015-05-10 DIAGNOSIS — G4730 Sleep apnea, unspecified: Secondary | ICD-10-CM | POA: Diagnosis not present

## 2015-05-10 LAB — CBC WITH DIFFERENTIAL/PLATELET
BASOS ABS: 0 10*3/uL (ref 0–0.1)
BASOS PCT: 0 %
EOS ABS: 0 10*3/uL (ref 0–0.7)
Eosinophils Relative: 1 %
HEMATOCRIT: 38.7 % — AB (ref 40.0–52.0)
Hemoglobin: 13.3 g/dL (ref 13.0–18.0)
Lymphocytes Relative: 37 %
Lymphs Abs: 0.9 10*3/uL — ABNORMAL LOW (ref 1.0–3.6)
MCH: 35 pg — ABNORMAL HIGH (ref 26.0–34.0)
MCHC: 34.4 g/dL (ref 32.0–36.0)
MCV: 101.9 fL — ABNORMAL HIGH (ref 80.0–100.0)
MONO ABS: 0.3 10*3/uL (ref 0.2–1.0)
MONOS PCT: 11 %
Neutro Abs: 1.3 10*3/uL — ABNORMAL LOW (ref 1.4–6.5)
Neutrophils Relative %: 51 %
PLATELETS: 96 10*3/uL — AB (ref 150–440)
RBC: 3.79 MIL/uL — ABNORMAL LOW (ref 4.40–5.90)
RDW: 13.5 % (ref 11.5–14.5)
WBC: 2.6 10*3/uL — ABNORMAL LOW (ref 3.8–10.6)

## 2015-05-10 LAB — BASIC METABOLIC PANEL
Anion gap: 7 (ref 5–15)
BUN: 15 mg/dL (ref 6–20)
CALCIUM: 8.5 mg/dL — AB (ref 8.9–10.3)
CO2: 27 mmol/L (ref 22–32)
CREATININE: 1.06 mg/dL (ref 0.61–1.24)
Chloride: 107 mmol/L (ref 101–111)
GFR calc non Af Amer: >60 mL/min (ref >60)
GLUCOSE: 118 mg/dL — AB (ref 65–99)
Potassium: 3.9 mmol/L (ref 3.5–5.1)
Sodium: 141 mmol/L (ref 135–145)

## 2015-05-12 DIAGNOSIS — D472 Monoclonal gammopathy: Secondary | ICD-10-CM | POA: Diagnosis not present

## 2015-05-12 LAB — KAPPA/LAMBDA LIGHT CHAINS
KAPPA FREE LGHT CHN: 31.85 mg/L — AB (ref 3.30–19.40)
KAPPA, LAMDA LIGHT CHAIN RATIO: 2.96 — AB (ref 0.26–1.65)
Lambda free light chains: 10.76 mg/L (ref 5.71–26.30)

## 2015-05-12 LAB — CEA: CEA: 1.4 ng/mL (ref 0.0–4.7)

## 2015-05-13 ENCOUNTER — Other Ambulatory Visit: Payer: Medicare Other

## 2015-05-13 ENCOUNTER — Inpatient Hospital Stay: Payer: Medicare Other

## 2015-05-13 ENCOUNTER — Inpatient Hospital Stay (HOSPITAL_BASED_OUTPATIENT_CLINIC_OR_DEPARTMENT_OTHER): Payer: Medicare Other | Admitting: Oncology

## 2015-05-13 DIAGNOSIS — C189 Malignant neoplasm of colon, unspecified: Secondary | ICD-10-CM

## 2015-05-13 DIAGNOSIS — Z85828 Personal history of other malignant neoplasm of skin: Secondary | ICD-10-CM

## 2015-05-13 DIAGNOSIS — D696 Thrombocytopenia, unspecified: Secondary | ICD-10-CM | POA: Diagnosis not present

## 2015-05-13 DIAGNOSIS — I10 Essential (primary) hypertension: Secondary | ICD-10-CM

## 2015-05-13 DIAGNOSIS — Z85038 Personal history of other malignant neoplasm of large intestine: Secondary | ICD-10-CM | POA: Diagnosis not present

## 2015-05-13 DIAGNOSIS — D472 Monoclonal gammopathy: Secondary | ICD-10-CM

## 2015-05-13 DIAGNOSIS — D693 Immune thrombocytopenic purpura: Secondary | ICD-10-CM

## 2015-05-13 DIAGNOSIS — G309 Alzheimer's disease, unspecified: Secondary | ICD-10-CM

## 2015-05-13 DIAGNOSIS — Z86010 Personal history of colonic polyps: Secondary | ICD-10-CM

## 2015-05-13 DIAGNOSIS — Z87891 Personal history of nicotine dependence: Secondary | ICD-10-CM

## 2015-05-13 DIAGNOSIS — D72819 Decreased white blood cell count, unspecified: Secondary | ICD-10-CM | POA: Diagnosis not present

## 2015-05-13 DIAGNOSIS — Z79899 Other long term (current) drug therapy: Secondary | ICD-10-CM

## 2015-05-13 DIAGNOSIS — Z87442 Personal history of urinary calculi: Secondary | ICD-10-CM

## 2015-05-13 DIAGNOSIS — K219 Gastro-esophageal reflux disease without esophagitis: Secondary | ICD-10-CM

## 2015-05-13 DIAGNOSIS — Z800 Family history of malignant neoplasm of digestive organs: Secondary | ICD-10-CM

## 2015-05-13 DIAGNOSIS — F329 Major depressive disorder, single episode, unspecified: Secondary | ICD-10-CM

## 2015-05-13 DIAGNOSIS — G4730 Sleep apnea, unspecified: Secondary | ICD-10-CM

## 2015-05-13 LAB — PROTEIN ELECTROPHORESIS, SERUM
A/G Ratio: 1.1 (ref 0.7–1.7)
ALPHA-2-GLOBULIN: 0.4 g/dL (ref 0.4–1.0)
Albumin ELP: 3.6 g/dL (ref 2.9–4.4)
Alpha-1-Globulin: 0.2 g/dL (ref 0.0–0.4)
Beta Globulin: 2.1 g/dL — ABNORMAL HIGH (ref 0.7–1.3)
Gamma Globulin: 0.4 g/dL (ref 0.4–1.8)
Globulin, Total: 3.2 g/dL (ref 2.2–3.9)
M-SPIKE, %: 1.5 g/dL — AB
Total Protein ELP: 6.8 g/dL (ref 6.0–8.5)

## 2015-05-15 LAB — UIFE/LIGHT CHAINS/TP QN, 24-HR UR
% BETA, Urine: 67.6 %
ALPHA 1 URINE: 3.7 %
Albumin, U: 16.3 %
Alpha 2, Urine: 8.2 %
FREE LT CHN EXCR RATE: 97.4 mg/L — AB (ref 1.35–24.19)
Free Kappa/Lambda Ratio: 42.35 — ABNORMAL HIGH (ref 2.04–10.37)
Free Lambda Lt Chains,Ur: 2.3 mg/L (ref 0.24–6.66)
GAMMA GLOBULIN URINE: 4.3 %
M-SPIKE %, URINE: 48.5 % — AB
M-SPIKE, MG/24 HR: 31 mg/(24.h) — AB
TOTAL PROTEIN, URINE-UPE24: 8.6 mg/dL
Time: 9032 hours
Total Protein, Urine-Ur/day: 64.5 mg/24 hr (ref 30.0–150.0)

## 2015-05-18 NOTE — Progress Notes (Signed)
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  Chief Complaint  Patient presents with  . Follow-up    MGUS    INTERVAL HISTORY: Patient returns to clinic today for laboratory work and routine clinical evaluation. He continues to feel well and is asymptomatic.  He has no neurologic complaints.  He has had no recent fevers.  He denies any easy bleeding or bruising.  He denies any weakness or fatigue. He has a good appetite and has maintained his weight.  He denies any chest pain, shortness breath, cough, or hemoptysis.  He denies any bony pain. He denies any nausea, vomiting, constipation, or diarrhea.  He has noted no changes in his bowel movements and denies any melena or hematochezia.  Patient offers no specific complaints today.   REVIEW OF SYSTEMS:   Review of Systems  Constitutional: Negative.   Respiratory: Negative.   Cardiovascular: Negative.   Musculoskeletal: Negative.     As per HPI. Otherwise, a complete review of systems is negatve.  PAST MEDICAL HISTORY: Past Medical History  Diagnosis Date  . GERD (gastroesophageal reflux disease)   . Pollen allergies   . Unspecified essential hypertension   . History of colon polyps   . History of gallstones   . Complication of anesthesia     severe confusion  . Major depressive disorder, recurrent episode, in partial remission with anxious distress   . Hx of transfusion of platelets   .  Alzheimers disease ~ 2002    "not severe" (12/22/2012)  . Sleep apnea     "won't wear mask anymore" (12/22/2012)  . ITP (idiopathic thrombocytopenic purpura)     Dr. Grayland Ormond follows  . Kidney stones   . Skin cancer     ears  . Colon adenocarcinoma   . Central sleep apnea   . Pancreatitis 2/11  . Memory loss     PAST SURGICAL HISTORY: Past Surgical History  Procedure Laterality Date  . Cholecystectomy  2011  . Tonsillectomy and adenoidectomy  1946  . Total hip arthroplasty Left ~ 2002  . Lithotripsy  2008    x2  . Pilonidal cyst excision  1960's?  Marland Kitchen Upper gastrointestinal endoscopy    . Partial colectomy  03/21/2012    Procedure: PARTIAL COLECTOMY;  Surgeon: Harl Bowie, MD;  Location: [place] [facility];  Service: General;  Laterality: Right;  . Inguinal hernia repair Left [id number]  . Hernia repair  12/22/2012    IHR w/mesh  . Esophageal dilation  1990's?  . Cardiac catheterization  2000's    "tx'd w/acid reflux RX" (12/22/2012)  . Skin cancer excision      "face; ?left ear" (12/22/2012)  . Ventral hernia repair N/A 12/22/2012    Procedure: HERNIA REPAIR INCISIONAL ;  Surgeon: Harl Bowie, MD;  Location: [facility];  Service: General;  Laterality: N/A;  . Insertion of mesh N/A 12/22/2012    Procedure: INSERTION OF MESH;  Surgeon: Harl Bowie, MD;  Location: [place];  Service: General;  Laterality: N/A;    FAMILY HISTORY Family History  Problem Relation Age of Onset  . Colon cancer Mother   . Heart disease Mother   . Hypertension Mother   . Alcohol abuse Father   . Esophageal cancer Neg Hx   . Rectal cancer Neg Hx   . Stomach cancer Neg Hx        ADVANCED DIRECTIVES:    HEALTH MAINTENANCE: Social History  Substance Use Topics  . Smoking status: Former Smoker    Types: Pipe  . Smokeless tobacco: Never Used     Comment: i4/06/2013 "only smoked pipe n college 1958"  . Alcohol Use: No     Colonoscopy:  PAP:  Bone density:  Lipid panel:  Allergies    Allergen Reactions  . Aspirin   . Chicken Allergy     unknown    Current Outpatient Prescriptions  Medication Sig Dispense Refill  . clonazePAM (KLONOPIN) 0.5 MG tablet TAKE 1/2 TABLET BY MOUTH AT BEDTIME AS DIRECTED 45 tablet 1  . donepezil (ARICEPT) 10 MG tablet take 1 tablet by mouth once daily 90 tablet 0  . Melatonin 5 MG TABS Prn at night .  0  . omeprazole (PRILOSEC) 20 MG capsule Take 20 mg by mouth daily.    Marland Kitchen triamterene-hydrochlorothiazide (MAXZIDE-25) 37.5-25 MG per tablet take 1/2 tablet by mouth once daily 45 tablet 0   No current facility-administered medications for this visit.    OBJECTIVE: There were no vitals filed for this visit.   There is no weight on file to calculate BMI.    ECOG FS:0 - Asymptomatic  General: Well-developed, well-nourished, no acute distress. Eyes: Pink conjunctiva, anicteric sclera. Lungs: Clear to auscultation bilaterally. Heart: Regular rate and rhythm. No rubs, murmurs, or gallops. Abdomen: Soft, nontender, nondistended. No organomegaly noted, normoactive bowel sounds. Musculoskeletal: No edema, cyanosis, or clubbing. Neuro: Alert, answering all questions appropriately. Cranial nerves grossly intact. Skin: No rashes or petechiae noted. Psych: Normal affect.   LAB RESULTS:  Lab Results  Component Value Date   NA 141 05/10/2015   K 3.9 05/10/2015   CL 107 05/10/2015   CO2 27 05/10/2015   GLUCOSE 118* 05/10/2015   BUN 15 05/10/2015   CREATININE 1.06 05/10/2015   CALCIUM 8.5* 05/10/2015   PROT 6.0 03/23/2012   ALBUMIN 2.3* 03/23/2012   AST 41* 03/23/2012   ALT 35 03/23/2012   ALKPHOS 99 03/23/2012   BILITOT 0.6 03/23/2012   GFRNONAA >60 05/10/2015   GFRAA >60 05/10/2015    Lab Results  Component Value Date   WBC 2.6* 05/10/2015   NEUTROABS 1.3* 05/10/2015   HGB 13.3 05/10/2015   HCT 38.7* 05/10/2015   MCV 101.9* 05/10/2015   PLT 96* 05/10/2015     STUDIES: No results found.  ASSESSMENT: MGUS, possible  smoldering multiple myeloma, stage IIa adenocarcinoma of the colon.  PLAN:    1.  MGUS: SIEP, UIEP, and thrombocytopenia continue to be stable and essentially unchanged. His M spike in his serum is 1.5.  Given the stability of his lab work and the fact that he is asymptomatic, a bone marrow biopsy is not necessary at this time. Previously, metastatic bone survey and CT scan were negative.  Because he has a suppressed IgA and IgM, it is possible that this is smoldering IgG multiple myeloma rather than MGUS. Smoldering multiple myeloma does not require treatment.  If he shows evidence of further endorgan damage would then proceed with a bone marrow biopsy and consideration of treatment.  Return to clinic in 6 months for repeat  laboratory work and further evaluation.   2.  Thrombocytopenia: Stable, monitor.  3.  Leukopenia: Stable and unchanged, monitor. 4.  Colon cancer: Patient is stage IIa with no high risk features, therefore adjuvant chemotherapy was not necessary.  CEA continues to be within normal limits.     Patient expressed understanding and was in agreement with this plan. He also understands that He can call clinic at any time with any questions, concerns, or complaints.   Lloyd Huger, MD   05/18/2015 3:12 PM

## 2015-05-23 ENCOUNTER — Encounter: Payer: Self-pay | Admitting: Family Medicine

## 2015-05-28 ENCOUNTER — Ambulatory Visit: Payer: Medicare Other | Admitting: Adult Health

## 2015-06-08 ENCOUNTER — Other Ambulatory Visit: Payer: Self-pay | Admitting: Neurology

## 2015-06-10 ENCOUNTER — Other Ambulatory Visit: Payer: Self-pay

## 2015-06-10 MED ORDER — CLONAZEPAM 0.5 MG PO TABS: 0.2500 mg | ORAL_TABLET | Freq: Every day | ORAL | Status: DC

## 2015-06-10 NOTE — Telephone Encounter (Signed)
Sent to Dr. Brett Fairy for approval.

## 2015-08-01 ENCOUNTER — Ambulatory Visit (INDEPENDENT_AMBULATORY_CARE_PROVIDER_SITE_OTHER): Payer: Medicare Other

## 2015-08-01 ENCOUNTER — Ambulatory Visit: Payer: Medicare Other

## 2015-08-01 DIAGNOSIS — Z23 Encounter for immunization: Secondary | ICD-10-CM

## 2015-08-09 ENCOUNTER — Other Ambulatory Visit: Payer: Self-pay | Admitting: Family Medicine

## 2015-09-30 ENCOUNTER — Encounter: Payer: Self-pay | Admitting: *Deleted

## 2015-09-30 ENCOUNTER — Emergency Department: Payer: Medicare Other

## 2015-09-30 ENCOUNTER — Other Ambulatory Visit (INDEPENDENT_AMBULATORY_CARE_PROVIDER_SITE_OTHER): Payer: Medicare Other

## 2015-09-30 ENCOUNTER — Emergency Department
Admission: EM | Admit: 2015-09-30 | Discharge: 2015-09-30 | Disposition: A | Discharge status: Home / Self Care | Payer: Medicare Other | Attending: Emergency Medicine | Admitting: Emergency Medicine

## 2015-09-30 DIAGNOSIS — I10 Essential (primary) hypertension: Secondary | ICD-10-CM | POA: Insufficient documentation

## 2015-09-30 DIAGNOSIS — E785 Hyperlipidemia, unspecified: Secondary | ICD-10-CM | POA: Diagnosis not present

## 2015-09-30 DIAGNOSIS — Z87891 Personal history of nicotine dependence: Secondary | ICD-10-CM | POA: Insufficient documentation

## 2015-09-30 DIAGNOSIS — Z79899 Other long term (current) drug therapy: Secondary | ICD-10-CM

## 2015-09-30 DIAGNOSIS — R1030 Lower abdominal pain, unspecified: Secondary | ICD-10-CM | POA: Insufficient documentation

## 2015-09-30 DIAGNOSIS — K6289 Other specified diseases of anus and rectum: Secondary | ICD-10-CM | POA: Diagnosis present

## 2015-09-30 DIAGNOSIS — G309 Alzheimer's disease, unspecified: Secondary | ICD-10-CM | POA: Insufficient documentation

## 2015-09-30 DIAGNOSIS — F0280 Dementia in other diseases classified elsewhere without behavioral disturbance: Secondary | ICD-10-CM | POA: Insufficient documentation

## 2015-09-30 LAB — COMPREHENSIVE METABOLIC PANEL
ALT: 17 U/L (ref 17–63)
AST: 16 U/L (ref 15–41)
Albumin: 4.1 g/dL (ref 3.5–5.0)
Alkaline Phosphatase: 49 U/L (ref 38–126)
Anion gap: 8 (ref 5–15)
BUN: 24 mg/dL — ABNORMAL HIGH (ref 6–20)
CHLORIDE: 110 mmol/L (ref 101–111)
CO2: 24 mmol/L (ref 22–32)
Calcium: 8.9 mg/dL (ref 8.9–10.3)
Creatinine, Ser: 1.1 mg/dL (ref 0.61–1.24)
Glucose, Bld: 109 mg/dL — ABNORMAL HIGH (ref 65–99)
POTASSIUM: 4.1 mmol/L (ref 3.5–5.1)
SODIUM: 142 mmol/L (ref 135–145)
Total Bilirubin: 0.5 mg/dL (ref 0.3–1.2)
Total Protein: 7.6 g/dL (ref 6.5–8.1)

## 2015-09-30 LAB — CBC WITH DIFFERENTIAL/PLATELET
BASOS PCT: 0.3 % (ref 0.0–3.0)
Basophils Absolute: 0 10*3/uL (ref 0.0–0.1)
EOS PCT: 0.6 % (ref 0.0–5.0)
Eosinophils Absolute: 0 10*3/uL (ref 0.0–0.7)
HEMATOCRIT: 38.8 % — AB (ref 39.0–52.0)
HEMOGLOBIN: 13 g/dL (ref 13.0–17.0)
Lymphocytes Relative: 30.3 % (ref 12.0–46.0)
Lymphs Abs: 0.9 10*3/uL (ref 0.7–4.0)
MCHC: 33.6 g/dL (ref 30.0–36.0)
MCV: 104.7 fl — AB (ref 78.0–100.0)
MONO ABS: 0.4 10*3/uL (ref 0.1–1.0)
MONOS PCT: 11.7 % (ref 3.0–12.0)
Neutro Abs: 1.7 10*3/uL (ref 1.4–7.7)
Neutrophils Relative %: 57.1 % (ref 43.0–77.0)
Platelets: 97 10*3/uL — ABNORMAL LOW (ref 150.0–400.0)
RBC: 3.7 Mil/uL — AB (ref 4.22–5.81)
RDW: 14.2 % (ref 11.5–15.5)
WBC: 3 10*3/uL — AB (ref 4.0–10.5)

## 2015-09-30 LAB — BASIC METABOLIC PANEL
BUN: 19 mg/dL (ref 6–23)
CHLORIDE: 107 meq/L (ref 96–112)
CO2: 26 mEq/L (ref 19–32)
Calcium: 8.9 mg/dL (ref 8.4–10.5)
Creatinine, Ser: 0.99 mg/dL (ref 0.40–1.50)
GFR: 77.11 mL/min (ref >60.00)
Glucose, Bld: 122 mg/dL — ABNORMAL HIGH (ref 70–99)
POTASSIUM: 4 meq/L (ref 3.5–5.1)
SODIUM: 142 meq/L (ref 135–145)

## 2015-09-30 LAB — HEPATIC FUNCTION PANEL
ALT: 13 U/L (ref 0–53)
AST: 13 U/L (ref 0–37)
Albumin: 3.9 g/dL (ref 3.5–5.2)
Alkaline Phosphatase: 51 U/L (ref 39–117)
BILIRUBIN DIRECT: 0 mg/dL (ref 0.0–0.3)
BILIRUBIN TOTAL: 0.6 mg/dL (ref 0.2–1.2)
TOTAL PROTEIN: 7.6 g/dL (ref 6.0–8.3)

## 2015-09-30 LAB — LIPID PANEL
CHOL/HDL RATIO: 4
Cholesterol: 140 mg/dL (ref 0–200)
HDL: 38.7 mg/dL — ABNORMAL LOW (ref >39.00)
LDL CALC: 87 mg/dL (ref 0–99)
NonHDL: 101.39
TRIGLYCERIDES: 74 mg/dL (ref 0.0–149.0)
VLDL: 14.8 mg/dL (ref 0.0–40.0)

## 2015-09-30 LAB — URINALYSIS COMPLETE WITH MICROSCOPIC (ARMC ONLY)
BACTERIA UA: NONE SEEN
BILIRUBIN URINE: NEGATIVE
Glucose, UA: NEGATIVE mg/dL
HGB URINE DIPSTICK: NEGATIVE
LEUKOCYTES UA: NEGATIVE
Nitrite: NEGATIVE
PH: 5 (ref 5.0–8.0)
Protein, ur: NEGATIVE mg/dL
Specific Gravity, Urine: 1.056 — ABNORMAL HIGH (ref 1.005–1.030)

## 2015-09-30 LAB — CBC
HEMATOCRIT: 39.4 % — AB (ref 40.0–52.0)
HEMOGLOBIN: 13.5 g/dL (ref 13.0–18.0)
MCH: 35.6 pg — ABNORMAL HIGH (ref 26.0–34.0)
MCHC: 34.2 g/dL (ref 32.0–36.0)
MCV: 104.1 fL — AB (ref 80.0–100.0)
Platelets: 97 10*3/uL — ABNORMAL LOW (ref 150–440)
RBC: 3.78 MIL/uL — AB (ref 4.40–5.90)
RDW: 13.7 % (ref 11.5–14.5)
WBC: 3.8 10*3/uL (ref 3.8–10.6)

## 2015-09-30 LAB — LIPASE, BLOOD: LIPASE: 35 U/L (ref 11–51)

## 2015-09-30 MED ORDER — SODIUM CHLORIDE 0.9 % IV BOLUS (SEPSIS)
1000.0000 mL | Freq: Once | INTRAVENOUS | Status: AC
  Administered 2015-09-30: 1000 mL via INTRAVENOUS

## 2015-09-30 MED ORDER — IOHEXOL 300 MG/ML  SOLN
100.0000 mL | Freq: Once | INTRAMUSCULAR | Status: AC | PRN
  Administered 2015-09-30: 100 mL via INTRAVENOUS

## 2015-09-30 MED ORDER — IOHEXOL 240 MG/ML SOLN
25.0000 mL | Freq: Once | INTRAMUSCULAR | Status: AC | PRN
  Administered 2015-09-30: 50 mL via ORAL

## 2015-09-30 NOTE — ED Notes (Addendum)
States rectal pain, hx of colon cancer and colon resection, family states pt has been complaining of back pain recently as well, also states some lower abd pain

## 2015-09-30 NOTE — ED Provider Notes (Signed)
[facility] Emergency Department Provider Note    ____________________________________________  Time seen: 1925  I have reviewed the triage vital signs and the nursing notes.   HISTORY  Chief Complaint Rectal Pain   History limited by: dementia, some history obtained from family   HPI John Summers is a 82 y.o. male with history of lewis body dementia, colon cancer, who presents to the emergency department today because of concern for rectal pain. The patient had pain in his rectum. It occurred sudenly. It started when the patient was trying to use the restroom to defecate. The pain has now improved. The patient has not had pain like this in the past. He does state that he has been having a harder time with constipation recently. No fevers.     Past Medical History  Diagnosis Date  . GERD (gastroesophageal reflux disease)   . Pollen allergies   . Unspecified essential hypertension   . History of colon polyps   . History of gallstones   . Complication of anesthesia     severe confusion  . Major depressive disorder, recurrent episode, in partial remission with anxious distress ([place])   . Hx of transfusion of platelets   . Alzheimers disease ~ 2002    "not severe" (12/22/2012)  . Sleep apnea     "won't wear mask anymore" (12/22/2012)  . ITP (idiopathic thrombocytopenic purpura)     Dr. Grayland Ormond follows  . Kidney stones   . Skin cancer     ears  . Colon adenocarcinoma ([place])   . Central sleep apnea   . Pancreatitis 2/11  . Memory loss     Patient Active Problem List   Diagnosis Date Noted  . Uncontrolled REM sleep behavior disorder 11/16/2014  . Lewy body dementia with behavioral disturbance 11/16/2014  . Tremor of right hand 11/16/2014  . Shuffling gait 11/16/2014  . Major depressive disorder, recurrent episode, in partial remission with anxious distress ([place])   . REM sleep behavior disorder 12/07/2013  . Delirium, drug-induced ([place])  02/23/2013  . Dementia with behavioral disturbance 02/23/2013  . Incisional hernia, without obstruction or gangrene 11/14/2012  . Colon cancer ([place]) 03/19/2012  . Adenocarcinoma of cecum ([place]) 03/19/2012  . GERD (gastroesophageal reflux disease) 03/19/2012  . Pollen allergies   . Kidney stones   . Sleep apnea   . Unspecified essential hypertension     Past Surgical History  Procedure Laterality Date  . Cholecystectomy  2011  . Tonsillectomy and adenoidectomy  1946  . Total hip arthroplasty Left ~ 2002  . Lithotripsy  2008    x2  . Pilonidal cyst excision  1960's?  Marland Kitchen Upper gastrointestinal endoscopy    . Partial colectomy  03/21/2012    Procedure: PARTIAL COLECTOMY;  Surgeon: Harl Bowie, MD;  Location: [place];  Service: General;  Laterality: Right;  . Inguinal hernia repair Left [id number]  . Hernia repair  12/22/2012    IHR w/mesh  . Esophageal dilation  1990's?  . Cardiac catheterization  2000's    "tx'd w/acid reflux RX" (12/22/2012)  . Skin cancer excision      "face; ?left ear" (12/22/2012)  . Ventral hernia repair N/A 12/22/2012    Procedure: HERNIA REPAIR INCISIONAL ;  Surgeon: Harl Bowie, MD;  Location: [place];  Service: General;  Laterality: N/A;  . Insertion of mesh N/A 12/22/2012    Procedure: INSERTION OF MESH;  Surgeon: Harl Bowie, MD;  Location: [place];  Service: General;  Laterality: N/A;    Current Outpatient Rx  Name  Route  Sig  Dispense  Refill  . clonazePAM (KLONOPIN) 0.5 MG tablet   Oral   Take 0.5 tablets (0.25 mg total) by mouth at bedtime.   45 tablet   1     Pharmacy Fax [phone]   . donepezil (ARICEPT) 10 MG tablet      take 1 tablet by mouth once daily   90 tablet   0   . Melatonin 5 MG TABS      Prn at night .      0   . omeprazole (PRILOSEC) 20 MG capsule   Oral   Take 20 mg by mouth daily.         Marland Kitchen triamterene-hydrochlorothiazide (MAXZIDE-25) 37.5-25 MG tablet      take 1/2 tablet by mouth once daily   45  tablet   0     Allergies Aspirin and Chicken allergy  Family History  Problem Relation Age of Onset  . Colon cancer Mother   . Heart disease Mother   . Hypertension Mother   . Alcohol abuse Father   . Esophageal cancer Neg Hx   . Rectal cancer Neg Hx   . Stomach cancer Neg Hx     Social History Social History  Substance Use Topics  . Smoking status: Former Smoker    Types: Pipe  . Smokeless tobacco: Never Used     Comment: i4/06/2013 "only smoked pipe n college 1958"  . Alcohol Use: No    Review of Systems  Constitutional: Negative for fever. Cardiovascular: Negative for chest pain. Respiratory: Negative for shortness of breath. Gastrointestinal: Positive for rectal pain Neurological: Negative for headaches, focal weakness or numbness.  10-point ROS otherwise negative.  ____________________________________________   PHYSICAL EXAM:  VITAL SIGNS: ED Triage Vitals  Enc Vitals Group     BP 09/30/15 1722 138/69 mmHg     Pulse Rate 09/30/15 1722 75     Resp 09/30/15 1722 18     Temp 09/30/15 1722 98 F (36.7 C)     Temp Source 09/30/15 1722 Oral     SpO2 09/30/15 1722 100 %     Weight 09/30/15 1722 230 lb (104.327 kg)     Height 09/30/15 1722 5\' 10"  (1.778 m)     Head Cir --      Peak Flow --      Pain Score 09/30/15 1723 6   Constitutional: Alert and oriented. Well appearing and in no distress. Eyes: Conjunctivae are normal. PERRL. Normal extraocular movements. ENT   Head: Normocephalic and atraumatic.   Nose: No congestion/rhinnorhea.   Mouth/Throat: Mucous membranes are moist.   Neck: No stridor. Hematological/Lymphatic/Immunilogical: No cervical lymphadenopathy. Cardiovascular: Normal rate, regular rhythm.  No murmurs, rubs, or gallops. Respiratory: Normal respiratory effort without tachypnea nor retractions. Breath sounds are clear and equal bilaterally. No wheezes/rales/rhonchi. Gastrointestinal: Soft and somewhat diffusely tender,  perhaps slightly worse in the left side. Genitourinary: Deferred Musculoskeletal: Normal range of motion in all extremities. No joint effusions.  No lower extremity tenderness nor edema. Neurologic:  Normal speech and language. No gross focal neurologic deficits are appreciated.  Skin:  Skin is warm, dry and intact. No rash noted. Psychiatric: Mood and affect are normal. Speech and behavior are normal. Patient exhibits appropriate insight and judgment.  ____________________________________________    LABS (pertinent positives/negatives)  Labs Reviewed  COMPREHENSIVE METABOLIC PANEL - Abnormal; Notable for the following:    Glucose, Bld 109 (*)  BUN 24 (*)    All other components within normal limits  CBC - Abnormal; Notable for the following:    RBC 3.78 (*)    HCT 39.4 (*)    MCV 104.1 (*)    MCH 35.6 (*)    Platelets 97 (*)    All other components within normal limits  LIPASE, BLOOD  URINALYSIS COMPLETEWITH MICROSCOPIC ([facility] ONLY)    ____________________________________________   EKG  None  ____________________________________________    RADIOLOGY  CT abd/pel  IMPRESSION: No acute intra-abdominal or pelvic pathology. Large ventral hernia along the surgical scar containing loops of small bowel with evidence of peritoneal adhesions. No bowel obstruction or inflammation.  Moderate stool within the rectosigmoid.  Sigmoid diverticulosis.  A 6 mm hypodense lesion in the proximal body of the pancreas minimally increased since the prior study. MRI may provide better evaluation if clinically indicated.   ____________________________________________   PROCEDURES  Procedure(s) performed: None  Critical Care performed: No  ____________________________________________   INITIAL IMPRESSION / ASSESSMENT AND PLAN / ED COURSE  Pertinent labs & imaging results that were available during my care of the patient were reviewed by me and considered in my medical  decision making (see chart for details).  Patient presented to the emergency department today because of concerns for abdominal pain. On my exam patient did have some tenderness with some slight increased tenderness on the left side of the abdomen. Because of this tenderness I did obtain a CT scan. This did not show any acute intra-abdominal pathology. While here the patient did develop diarrhea. He also had a temperature of 100.1. At this point given the negative CT that did not show any colitis or diverticulitis I think likely a viral cause of the slight increase in temperature and diarrhea is most likely.  ____________________________________________   FINAL CLINICAL IMPRESSION(S) / ED DIAGNOSES  Final diagnoses:  Lower abdominal pain     Nance Pear, MD 09/30/15 2342

## 2015-09-30 NOTE — Discharge Instructions (Signed)
Please seek medical attention for any high fevers, chest pain, shortness of breath, change in behavior, persistent vomiting, bloody stool or any other new or concerning symptoms. ° ° °Abdominal Pain, Adult °Many things can cause abdominal pain. Usually, abdominal pain is not caused by a disease and will improve without treatment. It can often be observed and treated at home. Your health care provider will do a physical exam and possibly order blood tests and X-rays to help determine the seriousness of your pain. However, in many cases, more time must pass before a clear cause of the pain can be found. Before that point, your health care provider may not know if you need more testing or further treatment. °HOME CARE INSTRUCTIONS °Monitor your abdominal pain for any changes. The following actions may help to alleviate any discomfort you are experiencing: °· Only take over-the-counter or prescription medicines as directed by your health care provider. °· Do not take laxatives unless directed to do so by your health care provider. °· Try a clear liquid diet (broth, tea, or water) as directed by your health care provider. Slowly move to a bland diet as tolerated. °SEEK MEDICAL CARE IF: °· You have unexplained abdominal pain. °· You have abdominal pain associated with nausea or diarrhea. °· You have pain when you urinate or have a bowel movement. °· You experience abdominal pain that wakes you in the night. °· You have abdominal pain that is worsened or improved by eating food. °· You have abdominal pain that is worsened with eating fatty foods. °· You have a fever. °SEEK IMMEDIATE MEDICAL CARE IF: °· Your pain does not go away within 2 hours. °· You keep throwing up (vomiting). °· Your pain is felt only in portions of the abdomen, such as the right side or the left lower portion of the abdomen. °· You pass bloody or black tarry stools. °MAKE SURE YOU: °· Understand these instructions. °· Will watch your  condition. °· Will get help right away if you are not doing well or get worse. °  °This information is not intended to replace advice given to you by your health care provider. Make sure you discuss any questions you have with your health care provider. °  °Document Released: 06/10/2005 Document Revised: 05/22/2015 Document Reviewed: 05/10/2013 °Elsevier Interactive Patient Education ©2016 [employer] ° °

## 2015-09-30 NOTE — ED Notes (Signed)
Pt assisted to with urinal, not able to urinate.

## 2015-10-01 ENCOUNTER — Telehealth: Payer: Self-pay | Admitting: Family Medicine

## 2015-10-01 NOTE — Telephone Encounter (Signed)
Daughter Deeann Dowse called, pt seen at [facility] ED 09/30/15 for diarrhea and "belly pain", states it was diagnosed as viral.  However, daughter and her mother would like to have a referral to Home health for additional help in the home (he is still having pain and discomfort, declined appointment since they are tired from having spent the evening in the ED). Best number to reach Edd Arbour is [phone], may leave message.

## 2015-10-01 NOTE — Telephone Encounter (Signed)
Unfortunately, Medicare requires a face to face office visit for home health - I believe that I am seeing him on Thursday, and I can do this then pretty easily. It will basically be impossible to try to arrange home health without a face to face encounter.  I am not sure what else I can do other than offer to emergently see him tomorrow.

## 2015-10-01 NOTE — Telephone Encounter (Signed)
Left message for John Summers that Medicare requires a face-to face office visit prior to ordering home health.  John Summers is scheduled to see Dr. Lorelei Pont on Thursday 10/03/2015 for medicare wellness visit.  Can take care of this at that appointment.

## 2015-10-03 ENCOUNTER — Encounter: Payer: Self-pay | Admitting: Family Medicine

## 2015-10-03 ENCOUNTER — Ambulatory Visit (INDEPENDENT_AMBULATORY_CARE_PROVIDER_SITE_OTHER): Payer: Medicare Other | Admitting: Family Medicine

## 2015-10-03 VITALS — BP 110/70 | HR 69 | Temp 98.9°F | Ht 68.5 in | Wt 218.2 lb

## 2015-10-03 DIAGNOSIS — F0281 Dementia in other diseases classified elsewhere with behavioral disturbance: Secondary | ICD-10-CM

## 2015-10-03 DIAGNOSIS — D696 Thrombocytopenia, unspecified: Secondary | ICD-10-CM

## 2015-10-03 DIAGNOSIS — Z0000 Encounter for general adult medical examination without abnormal findings: Secondary | ICD-10-CM | POA: Diagnosis not present

## 2015-10-03 DIAGNOSIS — R1084 Generalized abdominal pain: Secondary | ICD-10-CM

## 2015-10-03 DIAGNOSIS — R29898 Other symptoms and signs involving the musculoskeletal system: Secondary | ICD-10-CM

## 2015-10-03 DIAGNOSIS — G3183 Dementia with Lewy bodies: Secondary | ICD-10-CM

## 2015-10-03 DIAGNOSIS — C180 Malignant neoplasm of cecum: Secondary | ICD-10-CM

## 2015-10-03 DIAGNOSIS — R2689 Other abnormalities of gait and mobility: Secondary | ICD-10-CM

## 2015-10-03 MED ORDER — TRIAMCINOLONE ACETONIDE 0.1 % EX CREA: 1.0000 "application " | TOPICAL_CREAM | Freq: Two times a day (BID) | CUTANEOUS | Status: DC

## 2015-10-03 NOTE — Progress Notes (Signed)
Pre visit review using our clinic review tool, if applicable. No additional management support is needed unless otherwise documented below in the visit note. 

## 2015-10-03 NOTE — Progress Notes (Signed)
Dr. Frederico Hamman T. Denetria Luevanos, MD, [facility] [place], [address] Phone: [phone] Fax: [phone]  10/03/2015  Patient: John Summers, MRN: [id number], DOB: April 25, 1933, 82 y.o.  Primary Physician:  Owens Loffler, MD   Chief Complaint  Patient presents with  . Annual Exam    Medicare Wellness   Subjective:   John Summers is a 82 y.o. pleasant patient who presents for a medicare wellness examination:  Preventative Health Maintenance Visit: as well as some other ongoing and acute issues.   History of adenocarcinoma of the colon. Partial colectomy done on July 2013.  No follow-up colonoscopy.  It looks as if he is been sent 2 let her is from gastroenterology, but they have not followed up.  [name] wife tells me that her understanding was that no additional colonoscopy was going to be needed secondary to issues with anesthesia.  Thrombocytopenia.  Presumed myelodysplastic disorder followed by Dr. Grayland Ormond, but never had bone marrow biopsy. Myeloma cannot be excluded.   Lewy-body dementia. Not doing very well according to his wife and stepdaughter.  He is having some significant visual and all factory hallucinations.  He is taking some Klonopin.  Is also on Aricept.  Dr. Brett Fairy sees him.   2 or 3 falls within the last 2 or 3 months.  Right now he complained some of some back pain.  09/30/2015: ER visit for abdominal pain and extensive diarrhea.  Worked up in the ER, and had a grossly unremarkable CT of the abdomen and pelvis.  The was a small area of uptake - 6 mm hypodense area in the body of pancreas without significant change from prior scans.    His diarrhea has stopped, but he has developed some rash.  Wife tells me that while in the ER they did use copious amounts of some type of soap and water to clean him.  No other exposures.  Health Maintenance Summary Reviewed and updated, unless pt declines  services.  Tobacco History Reviewed. Alcohol: No concerns, no excessive use Exercise Habits: rare walking, not active STD concerns: no risk or activity to increase risk Drug Use: None Encouraged self-testicular check  Health Maintenance  Topic Date Due  . TETANUS/TDAP  10/02/1953  . ZOSTAVAX  10/02/1994  . COLONOSCOPY  03/08/2013  . INFLUENZA VACCINE  04/14/2016  . PNA vac Low Risk Adult  Completed    Immunization History  Administered Date(s) Administered  . Influenza Split 08/20/2011  . Influenza,inj,Quad PF,36+ Mos 06/21/2013, 05/03/2014, 08/01/2015  . Pneumococcal Conjugate-13 05/03/2014  . Pneumococcal Polysaccharide-23 11/11/2011, 02/21/2012    Patient Active Problem List   Diagnosis Date Noted  . Lewy body dementia with behavioral disturbance 11/16/2014    Priority: High  . Colon cancer ([place]) 03/19/2012    Priority: High  . Adenocarcinoma of cecum ([place]) 03/19/2012    Priority: High  . Sleep apnea     Priority: Medium  . Unspecified essential hypertension     Priority: Medium  . Uncontrolled REM sleep behavior disorder 11/16/2014  . Tremor of right hand 11/16/2014  . Shuffling gait 11/16/2014  . Major depressive disorder, recurrent episode, in partial remission with anxious distress ([place])   . REM sleep behavior disorder 12/07/2013  . Incisional hernia, without obstruction or gangrene 11/14/2012  . GERD (gastroesophageal reflux disease) 03/19/2012  . Pollen allergies   . Kidney stones    Past Medical History  Diagnosis Date  . GERD (gastroesophageal  reflux disease)   . Pollen allergies   . Unspecified essential hypertension   . History of colon polyps   . History of gallstones   . Complication of anesthesia     severe confusion  . Major depressive disorder, recurrent episode, in partial remission with anxious distress ([place])   . Hx of transfusion of platelets   . Alzheimers disease ~ 2002    "not severe" (12/22/2012)  . Sleep apnea     "won't wear mask  anymore" (12/22/2012)  . ITP (idiopathic thrombocytopenic purpura)     Dr. Grayland Ormond follows  . Kidney stones   . Skin cancer     ears  . Colon adenocarcinoma ([place])   . Central sleep apnea   . Pancreatitis 2/11  . Memory loss    Past Surgical History  Procedure Laterality Date  . Cholecystectomy  2011  . Tonsillectomy and adenoidectomy  1946  . Total hip arthroplasty Left ~ 2002  . Lithotripsy  2008    x2  . Pilonidal cyst excision  1960's?  Marland Kitchen Upper gastrointestinal endoscopy    . Partial colectomy  03/21/2012    Procedure: PARTIAL COLECTOMY;  Surgeon: Harl Bowie, MD;  Location: [facility];  Service: General;  Laterality: Right;  . Inguinal hernia repair Left 8938,1017  . Hernia repair  12/22/2012    IHR w/mesh  . Esophageal dilation  1990's?  . Cardiac catheterization  2000's    "tx'd w/acid reflux RX" (12/22/2012)  . Skin cancer excision      "face; ?left ear" (12/22/2012)  . Ventral hernia repair N/A 12/22/2012    Procedure: HERNIA REPAIR INCISIONAL ;  Surgeon: Harl Bowie, MD;  Location: [facility];  Service: General;  Laterality: N/A;  . Insertion of mesh N/A 12/22/2012    Procedure: INSERTION OF MESH;  Surgeon: Harl Bowie, MD;  Location: [facility];  Service: General;  Laterality: N/A;   Social History   Social History  . Marital Status: Married    Spouse Name: Charlett Nose  . Number of Children: 2  . Years of Education: college   Occupational History  .      Retired, Production manager, [employer]   Social History Main Topics  . Smoking status: Former Smoker    Types: Pipe  . Smokeless tobacco: Never Used     Comment: i4/06/2013 "only smoked pipe n college 1958"  . Alcohol Use: No  . Drug Use: No  . Sexual Activity: No   Other Topics Concern  . Not on file   Social History Narrative   Patient is married Charlett Nose) and lives with his wife.   Patient has two children and his wife has two children..   Patient is retired.   Patient has a college education.    Patient is right-handed.   Patient drinks 1-2 cups of coffee daily.   Family History  Problem Relation Age of Onset  . Colon cancer Mother   . Heart disease Mother   . Hypertension Mother   . Alcohol abuse Father   . Esophageal cancer Neg Hx   . Rectal cancer Neg Hx   . Stomach cancer Neg Hx    Allergies  Allergen Reactions  . Aspirin   . Chicken Allergy     unknown    Medication list has been reviewed and updated.   General: Denies fever, chills, sweats. No significant weight loss. Eyes: Denies blurring,significant itching ENT: Denies earache, sore throat, and hoarseness. Cardiovascular: Denies chest pains,  palpitations, dyspnea on exertion Respiratory: Denies cough, dyspnea at rest,wheeezing Breast: no concerns about lumps GI: Denies nausea, vomiting, diarrhea, constipation, change in bowel habits, abdominal pain, melena, hematochezia GU: Denies penile discharge, ED, urinary flow / outflow problems. No STD concerns. Musculoskeletal: Denies back pain, joint pain Derm: Denies rash, itching Neuro: Denies  paresthesias, frequent falls, frequent headaches Psych: Denies depression, anxiety Endocrine: Denies cold intolerance, heat intolerance, polydipsia Heme: Denies enlarged lymph nodes Allergy: No hayfever  Objective:   BP 110/70 mmHg  Pulse 69  Temp(Src) 98.9 F (37.2 C) (Oral)  Ht 5' 8.5" (1.74 m)  Wt 218 lb 4 oz (98.998 kg)  BMI 32.70 kg/m2  The patient completed a fall screen and PHQ-2 and PHQ-9 if necessary, which is documented in the EHR. The CMA/LPN/RN who assisted the patient verbally completed with them and documented results in [place].   Hearing Screening   Method: Audiometry   '125Hz'  '250Hz'  '500Hz'  '1000Hz'  '2000Hz'  '4000Hz'  '8000Hz'   Right ear:   40 0 40 0   Left ear:   40 0 40 0   Vision Screening Comments: Wears Eye Glasses-Goes to [facility] exams.  GEN: well developed, well nourished, no acute distress.  Eyes:  conjunctiva and lids normal, PERRLA, EOMI ENT: TM clear, nares clear, oral exam WNL Neck: supple, no lymphadenopathy, no thyromegaly, no JVD Pulm: clear to auscultation and percussion, respiratory effort normal CV: regular rate and rhythm, S1-S2, no murmur, rub or gallop, no bruits, peripheral pulses normal and symmetric, no cyanosis, clubbing, edema or varicosities GI: soft, non-tender; no hepatosplenomegaly, masses; active bowel sounds all quadrants. Minimal tenderness now, large ventral hernia.  GU: defer Lymph: no cervical, axillary or inguinal adenopathy MSK: very difficult time getting up from a seated position and uses his arms and upper body to help stand.  Difficult time getting up onto the examination table.  Shuffling gait. SKIN: scattered fine reddish rash on back and abdomen Neuro: pleasantly demented Psych:  normally interactive and not anxious or depressed in appearance.  All labs reviewed with patient and wife  Lipids:    Component Value Date/Time   CHOL 140 09/30/2015 1526   TRIG 74.0 09/30/2015 1526   HDL 38.70* 09/30/2015 1526   VLDL 14.8 09/30/2015 1526   CHOLHDL 4 09/30/2015 1526   CBC: CBC Latest Ref Rng 09/30/2015 09/30/2015 05/10/2015  WBC 3.8 - 10.6 K/uL 3.8 3.0(L) 2.6(L)  Hemoglobin 13.0 - 18.0 g/dL 13.5 13.0 13.3  Hematocrit 40.0 - 52.0 % 39.4(L) 38.8(L) 38.7(L)  Platelets 150 - 440 K/uL 97(L) 97.0(L) 96(L)    Basic Metabolic Panel:    Component Value Date/Time   NA 142 09/30/2015 1724   NA 144 10/10/2014 1051   K 4.1 09/30/2015 1724   K 3.9 10/10/2014 1051   CL 110 09/30/2015 1724   CL 106 10/10/2014 1051   CO2 24 09/30/2015 1724   CO2 30 10/10/2014 1051   BUN 24* 09/30/2015 1724   BUN 23* 10/10/2014 1051   CREATININE 1.10 09/30/2015 1724   CREATININE 1.17 10/10/2014 1051   GLUCOSE 109* 09/30/2015 1724   GLUCOSE 133* 10/10/2014 1051   CALCIUM 8.9 09/30/2015 1724   CALCIUM 8.4* 10/10/2014 1051   Hepatic Function Latest Ref Rng 09/30/2015  09/30/2015 03/23/2012  Total Protein 6.5 - 8.1 g/dL 7.6 7.6 6.0  Albumin 3.5 - 5.0 g/dL 4.1 3.9 2.3(L)  AST 15 - 41 U/L 16 13 41(H)  ALT 17 - 63 U/L 17 13 35  Alk Phosphatase 38 - 126 U/L 49 51 99  Total Bilirubin 0.3 - 1.2 mg/dL 0.5 0.6 0.6  Bilirubin, Direct 0.0 - 0.3 mg/dL - 0.0 -   Ct Abdomen Pelvis W Contrast  09/30/2015  CLINICAL DATA:  82 year old male with diarrhea and rectal pain. EXAM: CT ABDOMEN AND PELVIS WITH CONTRAST TECHNIQUE: Multidetector CT imaging of the abdomen and pelvis was performed using the standard protocol following bolus administration of intravenous contrast. CONTRAST:  151m OMNIPAQUE IOHEXOL 300 MG/ML SOLN, 537mOMNIPAQUE IOHEXOL 240 MG/ML SOLN COMPARISON:  CT dated 03/19/2012 FINDINGS: Minimal bibasilar atelectatic changes. The lung bases are otherwise clear. There is coronary vascular calcification. No intra-abdominal free air or free fluid. Cholecystectomy. The liver is unremarkable. There is a 16 mm cystic lesion arising from the ventral aspect of the proximal body of the pancreas (previously measured 13 mm in 2013) there is no dilatation of the main pancreatic duct or gland atrophy. This likely represents a benign process given relative interval stability. MRI may provide better characterization. The spleen, adrenal glands appear unremarkable. Punctate nonobstructing left renal interpolar calculus. No hydronephrosis. Subcentimeter right renal hypodensity is too small to characterize. There is no hydronephrosis on the right. The visualized ureters and urinary bladder appear unremarkable. The prostate gland is mildly enlarged measuring up to 5.2 cm in transverse axial diameter. Small hiatal hernia. There is sigmoid diverticulosis without active inflammatory changes. There is redundancy of the sigmoid colon. There postsurgical changes of partial colectomy with ileotransverse anastomosis in the right upper quadrant. No evidence of bowel obstruction. There is a 9 cm ventral  peritoneal defect in the midline along the surgical scar with protrusion of loops of small bowel which is new from prior study. There is abutment of bowel loops to the anterior peritoneal wall within this hernia compatible with adhesions. No evidence of obstruction or inflammatory changes. Appendectomy. There is aortoiliac atherosclerotic disease. No portal venous gas identified. There is no adenopathy. There is degenerative changes of the spine. No acute fracture. Left hip arthroplasty. IMPRESSION: No acute intra-abdominal or pelvic pathology. Large ventral hernia along the surgical scar containing loops of small bowel with evidence of peritoneal adhesions. No bowel obstruction or inflammation. Moderate stool within the rectosigmoid. Sigmoid diverticulosis. A 6 mm hypodense lesion in the proximal body of the pancreas minimally increased since the prior study. MRI may provide better evaluation if clinically indicated. Electronically Signed   By: ArAnner Crete.D.   On: 09/30/2015 22:01     Assessment and Plan:   Routine general medical examination at a health care facility  [place]um [place] Lewy body dementia with behavioral disturbance  Shuffling gait  Weakness of both lower extremities  Abdominal pain, generalized  [place], the patient's dementia is progressing.  More difficult time at home, and wife is having a harder time managing his care.  They just downsized to a smaller apartment.  He appears to have become quite weak, his gait is abnormal and he has a difficult time getting up from a seated position.  Physical therapy for gait training and lower body strengthening as well as core strengthening.  History of adenocarcinoma of the colon, status post partial colectomy in July 2013 without subsequent follow-up.  More complicated case in a patient with fluid body dementia as well as some thrombocytopenia, but typically I would expect that this would have routine follow-up.    The patient and his wife agreed to follow-up with Dr. JaArdis Hughsor his opinion and recommendations.  Diarrhea has resolved, and abdominal pain is much better.  Presumably, this is all viral.  Home Health documentation: Face to face encounter documentation follows: Patient needs home health services for the following reasons:  Home health services needed: 1. HHRN assessment: assessment home situation in a patient with Lewy body dementia that is advancing is a well as weakness, recent falls, shuffling gait, and an elderly caretaker. Evaluation for any potential assistance appreciated. 2. HHPT: consult physical therapy for home evaluation for lower extremity and core strengthening as well as gait training in a patient with a shuffling gait, and poor ability to rise from a chair as well as recent falls. Patient homebound for the following reasons who: Lewy body dementia, recent falls, weakness - unsafe for outpatient PT for all of these.   Health Maintenance Exam: [name] preventative maintenance and recommended screening tests for an annual wellness exam were reviewed in full today. Brought up to date unless services declined.  Counselled on the importance of diet, exercise, and its role in overall health and mortality. [name] FH and SH was reviewed, including their home life, tobacco status, and drug and alcohol status.  I have personally reviewed the Medicare Annual Wellness questionnaire and have noted 1. [name] medical and social history 2. Their use of alcohol, tobacco or illicit drugs 3. Their current medications and supplements 4. [name] functional ability including ADL's, fall risks, home safety risks and hearing or visual             impairment. 5. Diet and physical activities 6. Evidence for depression or mood disorders 7. Reviewed Updated provider list, see scanned forms and [facility] Snapshot.   The patients weight, height, BMI and visual acuity have been recorded  in the chart I have made referrals, counseling and provided education to the patient based review of the above and I have provided the pt with a written personalized care plan for preventive services.  I have provided the patient with a copy of your personalized plan for preventive services. Instructed to take the time to review along with their updated medication list.  Follow-up: No Follow-up on file. Or follow-up in 1 year for complete physical examination  New Prescriptions   TRIAMCINOLONE CREAM (KENALOG) 0.1 %    Apply 1 application topically 2 (two) times daily.   Modified Medications   No medications on file   No orders of the defined types were placed in this encounter.    Signed,  Maud Deed. Demya Scruggs, MD   Patient's Medications  New Prescriptions   TRIAMCINOLONE CREAM (KENALOG) 0.1 %    Apply 1 application topically 2 (two) times daily.  Previous Medications   CLONAZEPAM (KLONOPIN) 0.5 MG TABLET    Take 0.5 tablets (0.25 mg total) by mouth at bedtime.   DONEPEZIL (ARICEPT) 10 MG TABLET    take 1 tablet by mouth once daily   MELATONIN 5 MG TABS    Prn at night .   OMEPRAZOLE (PRILOSEC) 20 MG CAPSULE    Take 20 mg by mouth daily.   TRIAMTERENE-HYDROCHLOROTHIAZIDE (MAXZIDE-25) 37.5-25 MG TABLET    take 1/2 tablet by mouth once daily  Modified Medications   No medications on file  Discontinued Medications   No medications on file

## 2015-10-04 ENCOUNTER — Telehealth: Payer: Self-pay | Admitting: Gastroenterology

## 2015-10-04 ENCOUNTER — Encounter: Payer: Self-pay | Admitting: Family Medicine

## 2015-10-04 DIAGNOSIS — D696 Thrombocytopenia, unspecified: Secondary | ICD-10-CM | POA: Insufficient documentation

## 2015-10-04 NOTE — Telephone Encounter (Signed)
I spoke to the pt and he states that he would prefer for me to call back on Monday and speak to his wife.

## 2015-10-07 NOTE — Telephone Encounter (Signed)
Pt had abd pain and cramping and then had diarrhea and the pain did get some better, Dr Lorelei Pont requested the pt follow up with Dr Ardis Hughs to discuss colon.  Appt made for 12/10/15.

## 2015-10-15 ENCOUNTER — Other Ambulatory Visit: Payer: Self-pay | Admitting: Nurse Practitioner

## 2015-10-30 DIAGNOSIS — R2689 Other abnormalities of gait and mobility: Secondary | ICD-10-CM | POA: Diagnosis not present

## 2015-10-30 DIAGNOSIS — G309 Alzheimer's disease, unspecified: Secondary | ICD-10-CM | POA: Diagnosis not present

## 2015-10-30 DIAGNOSIS — F0281 Dementia in other diseases classified elsewhere with behavioral disturbance: Secondary | ICD-10-CM | POA: Diagnosis not present

## 2015-10-30 DIAGNOSIS — G3183 Dementia with Lewy bodies: Secondary | ICD-10-CM | POA: Diagnosis not present

## 2015-11-04 ENCOUNTER — Inpatient Hospital Stay: Payer: Medicare Other | Attending: Oncology

## 2015-11-04 DIAGNOSIS — Z87442 Personal history of urinary calculi: Secondary | ICD-10-CM | POA: Diagnosis not present

## 2015-11-04 DIAGNOSIS — K219 Gastro-esophageal reflux disease without esophagitis: Secondary | ICD-10-CM | POA: Insufficient documentation

## 2015-11-04 DIAGNOSIS — Z85038 Personal history of other malignant neoplasm of large intestine: Secondary | ICD-10-CM | POA: Insufficient documentation

## 2015-11-04 DIAGNOSIS — Z86010 Personal history of colonic polyps: Secondary | ICD-10-CM | POA: Diagnosis not present

## 2015-11-04 DIAGNOSIS — I10 Essential (primary) hypertension: Secondary | ICD-10-CM | POA: Insufficient documentation

## 2015-11-04 DIAGNOSIS — R112 Nausea with vomiting, unspecified: Secondary | ICD-10-CM | POA: Diagnosis not present

## 2015-11-04 DIAGNOSIS — F418 Other specified anxiety disorders: Secondary | ICD-10-CM | POA: Insufficient documentation

## 2015-11-04 DIAGNOSIS — Z800 Family history of malignant neoplasm of digestive organs: Secondary | ICD-10-CM | POA: Insufficient documentation

## 2015-11-04 DIAGNOSIS — R197 Diarrhea, unspecified: Secondary | ICD-10-CM | POA: Insufficient documentation

## 2015-11-04 DIAGNOSIS — G4730 Sleep apnea, unspecified: Secondary | ICD-10-CM | POA: Diagnosis not present

## 2015-11-04 DIAGNOSIS — D472 Monoclonal gammopathy: Secondary | ICD-10-CM | POA: Insufficient documentation

## 2015-11-04 DIAGNOSIS — Z85828 Personal history of other malignant neoplasm of skin: Secondary | ICD-10-CM | POA: Diagnosis not present

## 2015-11-04 DIAGNOSIS — Z8719 Personal history of other diseases of the digestive system: Secondary | ICD-10-CM | POA: Diagnosis not present

## 2015-11-04 DIAGNOSIS — Z79899 Other long term (current) drug therapy: Secondary | ICD-10-CM | POA: Diagnosis not present

## 2015-11-04 DIAGNOSIS — Z87891 Personal history of nicotine dependence: Secondary | ICD-10-CM | POA: Insufficient documentation

## 2015-11-04 DIAGNOSIS — D72819 Decreased white blood cell count, unspecified: Secondary | ICD-10-CM | POA: Insufficient documentation

## 2015-11-04 DIAGNOSIS — K869 Disease of pancreas, unspecified: Secondary | ICD-10-CM | POA: Diagnosis not present

## 2015-11-04 DIAGNOSIS — C189 Malignant neoplasm of colon, unspecified: Secondary | ICD-10-CM

## 2015-11-04 LAB — CBC
HEMATOCRIT: 37.3 % — AB (ref 40.0–52.0)
Hemoglobin: 12.9 g/dL — ABNORMAL LOW (ref 13.0–18.0)
MCH: 35.9 pg — ABNORMAL HIGH (ref 26.0–34.0)
MCHC: 34.5 g/dL (ref 32.0–36.0)
MCV: 103.8 fL — AB (ref 80.0–100.0)
PLATELETS: 86 10*3/uL — AB (ref 150–440)
RBC: 3.6 MIL/uL — ABNORMAL LOW (ref 4.40–5.90)
RDW: 13.7 % (ref 11.5–14.5)
WBC: 2.7 10*3/uL — ABNORMAL LOW (ref 3.8–10.6)

## 2015-11-05 ENCOUNTER — Ambulatory Visit (INDEPENDENT_AMBULATORY_CARE_PROVIDER_SITE_OTHER): Payer: Medicare Other | Admitting: Adult Health

## 2015-11-05 ENCOUNTER — Encounter: Payer: Self-pay | Admitting: Adult Health

## 2015-11-05 VITALS — BP 123/77 | HR 68 | Ht 68.5 in | Wt 219.0 lb

## 2015-11-05 DIAGNOSIS — F0280 Dementia in other diseases classified elsewhere without behavioral disturbance: Secondary | ICD-10-CM

## 2015-11-05 DIAGNOSIS — G3183 Dementia with Lewy bodies: Secondary | ICD-10-CM | POA: Diagnosis not present

## 2015-11-05 LAB — PROTEIN ELECTROPHORESIS, SERUM
A/G RATIO SPE: 1.1 (ref 0.7–1.7)
ALBUMIN ELP: 3.7 g/dL (ref 2.9–4.4)
ALPHA-1-GLOBULIN: 0.2 g/dL (ref 0.0–0.4)
Alpha-2-Globulin: 0.5 g/dL (ref 0.4–1.0)
BETA GLOBULIN: 2.2 g/dL — AB (ref 0.7–1.3)
GAMMA GLOBULIN: 0.5 g/dL (ref 0.4–1.8)
Globulin, Total: 3.5 g/dL (ref 2.2–3.9)
M-Spike, %: 1.4 g/dL — ABNORMAL HIGH
TOTAL PROTEIN ELP: 7.2 g/dL (ref 6.0–8.5)

## 2015-11-05 LAB — IMMUNOFIXATION ELECTROPHORESIS
IGG (IMMUNOGLOBIN G), SERUM: 2224 mg/dL — AB (ref 700–1600)
IgA: 66 mg/dL (ref 61–437)
IgM, Serum: 29 mg/dL (ref 15–143)
TOTAL PROTEIN ELP: 7.3 g/dL (ref 6.0–8.5)

## 2015-11-05 LAB — PROTEIN ELECTRO, RANDOM URINE
ALPHA-2-GLOBULIN, U: 5.1 %
Albumin ELP, Urine: 34.9 %
Alpha-1-Globulin, U: 2.8 %
Beta Globulin, U: 9.1 %
Gamma Globulin, U: 48 %
M SPIKE UR: 31.8 % — AB
Total Protein, Urine: 11 mg/dL

## 2015-11-05 LAB — KAPPA/LAMBDA LIGHT CHAINS
KAPPA FREE LGHT CHN: 33.73 mg/L — AB (ref 3.30–19.40)
KAPPA, LAMDA LIGHT CHAIN RATIO: 3.1 — AB (ref 0.26–1.65)
Lambda free light chains: 10.87 mg/L (ref 5.71–26.30)

## 2015-11-05 LAB — CEA: CEA: 1.7 ng/mL (ref 0.0–4.7)

## 2015-11-05 NOTE — Progress Notes (Signed)
PATIENT: John Summers DOB: Feb 12, 1933  REASON FOR VISIT: follow up- dementia HISTORY FROM: patient  HISTORY OF PRESENT ILLNESS: John Summers is an 82 year old male with a history of possible early body dementia. He returns today for follow-up. The patient remains on Aricept 10 mg daily. He also takes Klonopin half a tablet at bedtime. The patient feels that his memory has gotten slightly worse. He requires assistance with ADLs such as dressing. He does not operate a motor vehicle. The patient does have trouble with his balance and is working with physical therapy currently. He does not use an assistive device when ambulating. The patient does have some anxiety. When he becomes frustrated his tremor worsens and he will need to take deep breaths. These episodes only last for several seconds.  The patient continues to have hallucinations. His hallucinations are not violent or fearful. He states that he will see people in his home. He states it looks as if they're having conversation but he cannot hear them. He continues to take Klonopin at bedtime. His wife states that he sleeps very well at bedtime and patient agrees. Patient denies any changes with his appetite. He denies any new neurological symptoms. He returns today for an evaluation.  HISTORY 05/01/15: John Summers is an 82 year old male with a history of possible Lewy body dementia. He returns today for an evaluation. The patient is on Aricept 10 mg daily. He is tolerating this medication well. The patient also takes Klonopin 1/2 tablet at bedtime for REM sleep behavior disorder. At the last visit the patient was asked to start melatonin 5 mg in conjunction to the Klonopin. Wife reports that she could not remember the name of this medication and therefore they did not start it. The patient feels that his memory has gotten worse. He is able to complete all ADLs independently. He reports he does have trouble dressing but this is due to his  balance. The patient has had 2 falls since the last visit. The patient does not operate a motor vehicle. His wife now completes all the finances. He does report visual hallucinations. His hallucinations primarily consist of people. He reports that the hallucinations are not violent or fearful. They normally take place during the day rarely at night. The wife feels that his sleep is slightly better. He has been more calmer when he sleeps. The wife feels that he may be depressed. She states that he typically he has no motivation to do most things. She has encouraged him to complete basic needs such as bathing. Patient states that he does not want to start any medication for this. Denies any new neurological symptoms. He returns today for an evaluation.  REVIEW OF SYSTEMS: Out of a complete 14 system review of symptoms, the patient complains only of the following symptoms, and all other reviewed systems are negative.  ALLERGIES: Allergies  Allergen Reactions  . Aspirin   . Chicken Allergy     unknown    HOME MEDICATIONS: Outpatient Prescriptions Prior to Visit  Medication Sig Dispense Refill  . clonazePAM (KLONOPIN) 0.5 MG tablet Take 0.5 tablets (0.25 mg total) by mouth at bedtime. 45 tablet 1  . donepezil (ARICEPT) 10 MG tablet take 1 tablet by mouth once daily 30 tablet 0  . Melatonin 5 MG TABS Prn at night .  0  . omeprazole (PRILOSEC) 20 MG capsule Take 20 mg by mouth daily.    Marland Kitchen triamcinolone cream (KENALOG) 0.1 % Apply 1 application topically 2 (  two) times daily. 454 g 1  . triamterene-hydrochlorothiazide (MAXZIDE-25) 37.5-25 MG tablet take 1/2 tablet by mouth once daily 45 tablet 0   No facility-administered medications prior to visit.    PAST MEDICAL HISTORY: Past Medical History  Diagnosis Date  . GERD (gastroesophageal reflux disease)   . Pollen allergies   . Unspecified essential hypertension   . History of colon polyps   . History of gallstones   . Complication of  anesthesia     severe confusion  . Major depressive disorder, recurrent episode, in partial remission with anxious distress ([place])   . Hx of transfusion of platelets   . Lewy body dementia with behavioral disturbance   . Sleep apnea     "won't wear mask anymore" (12/22/2012)  . ITP (idiopathic thrombocytopenic purpura)     Dr. Grayland Ormond follows  . Kidney stones   . Skin cancer     ears  . Colon adenocarcinoma ([place])   . Pancreatitis 2/11    PAST SURGICAL HISTORY: Past Surgical History  Procedure Laterality Date  . Cholecystectomy  2011  . Tonsillectomy and adenoidectomy  1946  . Total hip arthroplasty Left ~ 2002  . Lithotripsy  2008    x2  . Pilonidal cyst excision  1960's?  Marland Kitchen Upper gastrointestinal endoscopy    . Partial colectomy  03/21/2012    Procedure: PARTIAL COLECTOMY;  Surgeon: Harl Bowie, MD;  Location: [facility];  Service: General;  Laterality: Right;  . Inguinal hernia repair Left [id number]  . Hernia repair  12/22/2012    IHR w/mesh  . Esophageal dilation  1990's?  . Cardiac catheterization  2000's    "tx'd w/acid reflux RX" (12/22/2012)  . Skin cancer excision      "face; ?left ear" (12/22/2012)  . Ventral hernia repair N/A 12/22/2012    Procedure: HERNIA REPAIR INCISIONAL ;  Surgeon: Harl Bowie, MD;  Location: [facility];  Service: General;  Laterality: N/A;  . Insertion of mesh N/A 12/22/2012    Procedure: INSERTION OF MESH;  Surgeon: Harl Bowie, MD;  Location: [facility];  Service: General;  Laterality: N/A;    FAMILY HISTORY: Family History  Problem Relation Age of Onset  . Colon cancer Mother   . Heart disease Mother   . Hypertension Mother   . Alcohol abuse Father   . Esophageal cancer Neg Hx   . Rectal cancer Neg Hx   . Stomach cancer Neg Hx     SOCIAL HISTORY: Social History   Social History  . Marital Status: Married    Spouse Name: Charlett Nose  . Number of Children: 2  . Years of Education: college   Occupational History  .       Retired, Production manager, [employer]   Social History Main Topics  . Smoking status: Former Smoker    Types: Pipe  . Smokeless tobacco: Never Used     Comment: i4/06/2013 "only smoked pipe n college 1958"  . Alcohol Use: No  . Drug Use: No  . Sexual Activity: No   Other Topics Concern  . Not on file   Social History Narrative   Patient is married Charlett Nose) and lives with his wife.   Patient has two children and his wife has two children..   Patient is retired.   Patient has a college education.   Patient is right-handed.   Patient drinks 1-2 cups of coffee daily.      PHYSICAL EXAM  Filed Vitals:  11/05/15 1121  BP: 123/77  Pulse: 68  Height: 5' 8.5" (1.74 m)  Weight: 219 lb (99.338 kg)   Body mass index is 32.81 kg/(m^2).   MMSE - Mini Mental State Exam 11/05/2015 05/01/2015 11/16/2014  Orientation to time 2 1 5   Orientation to Place 2 2 4   Registration 3 3 3   Attention/ Calculation 0 0 0  Recall 0 3 1  Language- name 2 objects 2 2 2   Language- repeat 1 1 1   Language- follow 3 step command 2 3 3   Language- read & follow direction 1 1 1   Write a sentence 0 0 1  Copy design 1 1 0  Total score 14 17 21      Generalized: Well developed, in no acute distress   Neurological examination  Mentation: Alert.. Follows all commands speech and language fluent Cranial nerve II-XII: Pupils were equal round reactive to light. Extraocular movements were full, visual field were full on confrontational test. Facial sensation and strength were normal. Uvula tongue midline. Head turning and shoulder shrug  were normal and symmetric. Motor: The motor testing reveals 5 over 5 strength of all 4 extremities. Good symmetric motor tone is noted throughout.  Intermittent resting tremor in both hands Sensory: Sensory testing is intact to soft touch on all 4 extremities. No evidence of extinction is noted.  Coordination: Cerebellar testing reveals good finger-nose-finger and  heel-to-shin bilaterally.  Gait and station:  Patient has a slight shuffling gait. He does require assistance with standing. Tandem gait not attempted. Decreased armswing bilaterally. Reflexes: Deep tendon reflexes are symmetric and normal bilaterally.   DIAGNOSTIC DATA (LABS, IMAGING, TESTING) - I reviewed patient records, labs, notes, testing and imaging myself where available.  Lab Results  Component Value Date   WBC 2.7* 11/04/2015   HGB 12.9* 11/04/2015   HCT 37.3* 11/04/2015   MCV 103.8* 11/04/2015   PLT 86* 11/04/2015      Component Value Date/Time   NA 142 09/30/2015 1724   NA 144 10/10/2014 1051   K 4.1 09/30/2015 1724   K 3.9 10/10/2014 1051   CL 110 09/30/2015 1724   CL 106 10/10/2014 1051   CO2 24 09/30/2015 1724   CO2 30 10/10/2014 1051   GLUCOSE 109* 09/30/2015 1724   GLUCOSE 133* 10/10/2014 1051   BUN 24* 09/30/2015 1724   BUN 23* 10/10/2014 1051   CREATININE 1.10 09/30/2015 1724   CREATININE 1.17 10/10/2014 1051   CALCIUM 8.9 09/30/2015 1724   CALCIUM 8.4* 10/10/2014 1051   PROT 7.6 09/30/2015 1724   ALBUMIN 4.1 09/30/2015 1724   AST 16 09/30/2015 1724   ALT 17 09/30/2015 1724   ALKPHOS 49 09/30/2015 1724   BILITOT 0.5 09/30/2015 1724   GFRNONAA >60 09/30/2015 1724   GFRNONAA >60 10/10/2014 1051   GFRNONAA 59* 04/02/2014 1045   GFRAA >60 09/30/2015 1724   GFRAA >60 10/10/2014 1051   GFRAA >60 04/02/2014 1045   Lab Results  Component Value Date   CHOL 140 09/30/2015   HDL 38.70* 09/30/2015   LDLCALC 87 09/30/2015   TRIG 74.0 09/30/2015   CHOLHDL 4 09/30/2015      ASSESSMENT AND PLAN 82 y.o. year old male  has a past medical history of GERD (gastroesophageal reflux disease); Pollen allergies; Unspecified essential hypertension; History of colon polyps; History of gallstones; Complication of anesthesia; Major depressive disorder, recurrent episode, in partial remission with anxious distress ([place]); transfusion of platelets; Lewy body dementia with  behavioral disturbance; Sleep apnea; ITP (idiopathic  thrombocytopenic purpura); Kidney stones; Skin cancer; Colon adenocarcinoma ([place]); and Pancreatitis (2/11). here with:  1. Dementia with Lewy bodies  [name] memory has really stable. His MMSE today is 14/30 was previously 17/30. He will continue on Aricept 10 mg daily. He will also continue on Klonopin half a tablet at bedtime.  We discussed possibly starting Namenda. The patient in wife will let me know if they decide to begin this.Patient and his wife advised that if his symptoms worsen or he develops any new symptoms he should let us know. He will follow-up in 6 months with Dr. Brett Fairy.  Ward Givens, MSN, NP-C 11/05/2015, 11:28 AM [facility] [address] [place], [place] [address] [phone]

## 2015-11-05 NOTE — Patient Instructions (Signed)
Continue Aricept  Continue Klonopin If hallucinations get worse- violent or fearful let us know If your symptoms worsen or you develop new symptoms please let us know.  Consider Namenda for memory  Memantine Tablets What is this medicine? MEMANTINE (MEM an teen) is used to treat dementia caused by Alzheimer's disease. This medicine may be used for other purposes; ask your health care provider or pharmacist if you have questions. What should I tell my health care provider before I take this medicine? They need to know if you have any of these conditions: -difficulty passing urine -kidney disease -liver disease -seizures -an unusual or allergic reaction to memantine, other medicines, foods, dyes, or preservatives -pregnant or trying to get pregnant -breast-feeding How should I use this medicine? Take this medicine by mouth with a glass of water. Follow the directions on the prescription label. You may take this medicine with or without food. Take your doses at regular intervals. Do not take your medicine more often than directed. Continue to take your medicine even if you feel better. Do not stop taking except on the advice of your doctor or health care professional. Talk to your pediatrician regarding the use of this medicine in children. Special care may be needed. Overdosage: If you think you have taken too much of this medicine contact a poison control center or emergency room at once. NOTE: This medicine is only for you. Do not share this medicine with others. What if I miss a dose? If you miss a dose, take it as soon as you can. If it is almost time for your next dose, take only that dose. Do not take double or extra doses. If you do not take your medicine for several days, contact your health care provider. Your dose may need to be changed. What may interact with this  medicine? -acetazolamide -amantadine -cimetidine -dextromethorphan -dofetilide -hydrochlorothiazide -ketamine -metformin -methazolamide -quinidine -ranitidine -sodium bicarbonate -triamterene This list may not describe all possible interactions. Give your health care provider a list of all the medicines, herbs, non-prescription drugs, or dietary supplements you use. Also tell them if you smoke, drink alcohol, or use illegal drugs. Some items may interact with your medicine. What should I watch for while using this medicine? Visit your doctor or health care professional for regular checks on your progress. Check with your doctor or health care professional if there is no improvement in your symptoms or if they get worse. You may get drowsy or dizzy. Do not drive, use machinery, or do anything that needs mental alertness until you know how this drug affects you. Do not stand or sit up quickly, especially if you are an older patient. This reduces the risk of dizzy or fainting spells. Alcohol can make you more drowsy and dizzy. Avoid alcoholic drinks. What side effects may I notice from receiving this medicine? Side effects that you should report to your doctor or health care professional as soon as possible: -allergic reactions like skin rash, itching or hives, swelling of the face, lips, or tongue -agitation or a feeling of restlessness -depressed mood -dizziness -hallucinations -redness, blistering, peeling or loosening of the skin, including inside the mouth -seizures -vomiting Side effects that usually do not require medical attention (report to your doctor or health care professional if they continue or are bothersome): -constipation -diarrhea -headache -nausea -trouble sleeping This list may not describe all possible side effects. Call your doctor for medical advice about side effects. You may report side effects to FDA at  1-800-FDA-1088. Where should I keep my medicine? Keep  out of the reach of children. Store at room temperature between 15 degrees and 30 degrees C (59 degrees and 86 degrees F). Throw away any unused medicine after the expiration date. NOTE: This sheet is a summary. It may not cover all possible information. If you have questions about this medicine, talk to your doctor, pharmacist, or health care provider.    2016, Elsevier/Gold Standard. (2013-06-19 14:10:42)

## 2015-11-06 NOTE — Progress Notes (Signed)
I agree with the assessment and plan as directed by NP .The patient is known to me .   [place] Kuang, MD  

## 2015-11-11 ENCOUNTER — Inpatient Hospital Stay (HOSPITAL_BASED_OUTPATIENT_CLINIC_OR_DEPARTMENT_OTHER): Payer: Medicare Other | Admitting: Oncology

## 2015-11-11 VITALS — BP 121/80 | HR 73 | Temp 97.2°F | Resp 18 | Wt 217.2 lb

## 2015-11-11 DIAGNOSIS — K869 Disease of pancreas, unspecified: Secondary | ICD-10-CM

## 2015-11-11 DIAGNOSIS — D72819 Decreased white blood cell count, unspecified: Secondary | ICD-10-CM | POA: Diagnosis not present

## 2015-11-11 DIAGNOSIS — R112 Nausea with vomiting, unspecified: Secondary | ICD-10-CM | POA: Diagnosis not present

## 2015-11-11 DIAGNOSIS — Z87442 Personal history of urinary calculi: Secondary | ICD-10-CM

## 2015-11-11 DIAGNOSIS — Z86010 Personal history of colonic polyps: Secondary | ICD-10-CM

## 2015-11-11 DIAGNOSIS — Z85828 Personal history of other malignant neoplasm of skin: Secondary | ICD-10-CM

## 2015-11-11 DIAGNOSIS — D472 Monoclonal gammopathy: Secondary | ICD-10-CM

## 2015-11-11 DIAGNOSIS — K219 Gastro-esophageal reflux disease without esophagitis: Secondary | ICD-10-CM

## 2015-11-11 DIAGNOSIS — G4730 Sleep apnea, unspecified: Secondary | ICD-10-CM

## 2015-11-11 DIAGNOSIS — D696 Thrombocytopenia, unspecified: Secondary | ICD-10-CM

## 2015-11-11 DIAGNOSIS — C189 Malignant neoplasm of colon, unspecified: Secondary | ICD-10-CM

## 2015-11-11 DIAGNOSIS — Z79899 Other long term (current) drug therapy: Secondary | ICD-10-CM

## 2015-11-11 DIAGNOSIS — F418 Other specified anxiety disorders: Secondary | ICD-10-CM

## 2015-11-11 DIAGNOSIS — Z800 Family history of malignant neoplasm of digestive organs: Secondary | ICD-10-CM

## 2015-11-11 DIAGNOSIS — Z85038 Personal history of other malignant neoplasm of large intestine: Secondary | ICD-10-CM

## 2015-11-11 DIAGNOSIS — R197 Diarrhea, unspecified: Secondary | ICD-10-CM

## 2015-11-11 DIAGNOSIS — Z8719 Personal history of other diseases of the digestive system: Secondary | ICD-10-CM

## 2015-11-11 DIAGNOSIS — Z87891 Personal history of nicotine dependence: Secondary | ICD-10-CM

## 2015-11-11 NOTE — Progress Notes (Signed)
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  Chief Complaint  Patient presents with  . MGUS (monoclonial  gammopathy of unknown significance    INTERVAL HISTORY: Patient returns to clinic today for laboratory work and routine clinical evaluation. He has recently started having night sweats which require him to change his shirt in the night. He was diagnosed about a year ago with Lewy Body Dementia. He has been having increasing hallucinations in the past 2-3 months as well as some dizziness and anxiety. PT does come in to his home to help him with his gait since he has fallen a few times. He does see a neurologist to manage these symptoms. He has been not feeling well and was in the ER recently and he continues to have diarrhea and vomiting but not since Friday. He did have a CT scan in the ER that shows a pancreas lesion and he was given referral to GI to assess. He has had no recent fevers.  He denies any easy bleeding or bruising.  He denies any weakness or fatigue. He has a good appetite and has maintained his weight.  He denies any chest pain, shortness breath, cough, or hemoptysis.  He denies any bony pain. He denies nausea or constipation.  He denies any melena or hematochezia.  Patient offers no further specific complaints today.   REVIEW OF SYSTEMS:   Review of Systems  Constitutional: Negative.   Eyes: Negative.   Respiratory: Negative.     Cardiovascular: Negative.   Gastrointestinal: Positive for vomiting and diarrhea.  Genitourinary: Negative.   Musculoskeletal: Negative.   Skin: Negative.   Neurological: Positive for dizziness.  Endo/Heme/Allergies: Negative.   Psychiatric/Behavioral: Positive for hallucinations. The patient is nervous/anxious.     As per HPI. Otherwise, a complete review of systems is negatve.  PAST MEDICAL HISTORY: Past Medical History  Diagnosis Date  . GERD (gastroesophageal reflux disease)   . Pollen allergies   . Unspecified essential hypertension   . History of colon polyps   . History of gallstones   . Complication of anesthesia     severe confusion  . Major depressive disorder, recurrent episode, in partial remission with anxious distress ([place])   . Hx of transfusion of platelets   . Lewy body dementia with behavioral disturbance   . Sleep apnea     "won't wear mask anymore" (12/22/2012)  . ITP (idiopathic thrombocytopenic purpura)     Dr. Grayland Ormond follows  . Kidney stones   . Skin cancer     ears  . Colon adenocarcinoma ([place])   . Pancreatitis 2/11    PAST SURGICAL HISTORY: Past Surgical History  Procedure Laterality Date  . Cholecystectomy  2011  . Tonsillectomy and adenoidectomy  1946  . Total hip arthroplasty Left ~ 2002  . Lithotripsy  2008    x2  . Pilonidal cyst excision  1960's?  Marland Kitchen Upper gastrointestinal endoscopy    . Partial colectomy  03/21/2012    Procedure: PARTIAL COLECTOMY;  Surgeon: Harl Bowie, MD;  Location: [facility];  Service: General;  Laterality: Right;  . Inguinal hernia repair Left [id number]  . Hernia repair  12/22/2012    IHR w/mesh  . Esophageal dilation  1990's?  . Cardiac catheterization  2000's    "tx'd w/acid reflux RX" (12/22/2012)  . Skin cancer excision      "face; ?left ear" (12/22/2012)  . Ventral hernia repair N/A 12/22/2012    Procedure: HERNIA REPAIR INCISIONAL ;  Surgeon: Harl Bowie, MD;  Location: [facility];  Service: General;   Laterality: N/A;  . Insertion of mesh N/A 12/22/2012    Procedure: INSERTION OF MESH;  Surgeon: Harl Bowie, MD;  Location: [facility];  Service: General;  Laterality: N/A;    FAMILY HISTORY Family History  Problem Relation Age of Onset  . Colon cancer Mother   . Heart disease Mother   . Hypertension Mother   . Alcohol abuse Father   . Esophageal cancer Neg Hx   . Rectal cancer Neg Hx   . Stomach cancer Neg Hx        ADVANCED DIRECTIVES:    HEALTH MAINTENANCE: Social History  Substance Use Topics  . Smoking status: Former Smoker    Types: Pipe  . Smokeless tobacco: Never Used     Comment: i4/06/2013 "only smoked pipe n college 1958"  . Alcohol Use: No     Colonoscopy:  PAP:  Bone density:  Lipid panel:  Allergies  Allergen Reactions  . Aspirin   . Chicken Allergy     unknown    Current Outpatient Prescriptions  Medication Sig Dispense Refill  . clonazePAM (KLONOPIN) 0.5 MG tablet Take 0.5 tablets (0.25 mg total) by mouth at bedtime. 45 tablet 1  . donepezil (ARICEPT) 10 MG tablet take 1 tablet by mouth once daily 30 tablet 0  . Melatonin 5 MG TABS Prn at night .  0  . omeprazole (PRILOSEC) 20 MG capsule Take 20 mg by mouth daily.    Marland Kitchen triamcinolone cream (KENALOG) 0.1 % Apply 1 application topically 2 (two) times daily. 454 g 1  . triamterene-hydrochlorothiazide (MAXZIDE-25) 37.5-25 MG tablet take 1/2 tablet by mouth once daily 45 tablet 0   No current facility-administered medications for this visit.    OBJECTIVE: Filed Vitals:   11/11/15 1059  BP: 121/80  Pulse: 73  Temp: 97.2 F (36.2 C)  Resp: 18     Body mass index is 32.53 kg/(m^2).    ECOG FS:0 - Asymptomatic  General: Well-developed, well-nourished, no acute distress. Eyes: Pink conjunctiva, anicteric sclera. Lungs: Clear to auscultation bilaterally. Heart: Regular rate and rhythm. No rubs, murmurs, or gallops. Abdomen: Soft, nontender, nondistended. No organomegaly noted, normoactive  bowel sounds. Musculoskeletal: No edema, cyanosis, or clubbing. Neuro: Alert, answering all questions appropriately. Cranial nerves grossly intact. Skin: No rashes or petechiae noted. Psych: Normal affect.   LAB RESULTS:  Lab Results  Component Value Date   NA 142 09/30/2015   K 4.1 09/30/2015   CL 110 09/30/2015   CO2 24 09/30/2015   GLUCOSE 109* 09/30/2015   BUN 24* 09/30/2015   CREATININE 1.10 09/30/2015   CALCIUM 8.9 09/30/2015   PROT 7.6 09/30/2015   ALBUMIN 4.1 09/30/2015   AST 16 09/30/2015   ALT 17 09/30/2015   ALKPHOS 49 09/30/2015   BILITOT 0.5 09/30/2015   GFRNONAA >60 09/30/2015   GFRAA >60 09/30/2015    Lab Results  Component Value Date   WBC 2.7* 11/04/2015   NEUTROABS 1.7 09/30/2015   HGB 12.9* 11/04/2015  HCT 37.3* 11/04/2015   MCV 103.8* 11/04/2015   PLT 86* 11/04/2015   Lab Results  Component Value Date   TOTALPROTELP 7.2 11/04/2015   ALBUMINELP 3.7 11/04/2015   A1GS 0.2 11/04/2015   A2GS 0.5 11/04/2015   BETS 2.2* 11/04/2015   GAMS 0.5 11/04/2015   MSPIKE 1.4* 11/04/2015   SPEI Comment 11/04/2015     STUDIES: No results found.  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  PLAN:    1.  MGUS: SIEP, UIEP, and thrombocytopenia continue to be stable and essentially unchanged. His M spike in his serum is 1.4.  Given the stability of his lab work and the fact that he is asymptomatic, a bone marrow biopsy is not necessary at this time. Previously, metastatic bone survey and CT scan were negative.  Because he has a suppressed IgA and IgM, it is possible that this is smoldering IgG multiple myeloma rather than MGUS. Smoldering multiple myeloma does not require treatment.  If he shows evidence of further endorgan damage would then proceed with a bone marrow biopsy and consideration of treatment.  Return to clinic in 3 months for cbc and then in 6 months for repeat laboratory work and further  evaluation.   2.  Thrombocytopenia: Trending down, monitor. Repeat cbc in 3 months.  3.  Leukopenia: Stable and unchanged, monitor. 4.  Colon cancer: Patient is stage IIa with no high risk features, therefore adjuvant chemotherapy was not necessary.  CEA continues to be within normal limits.   5. Lewy Body Dementia:  Continue follow up with neurologist, per wife every 4 months.  6. Vomiting/Diarrhea: probably viral.  7. Pancreas lesion: 6 mm lesion on pancreas that shows growth on CT. Likely to small to biopsy.  Keep GI appointment on December 10, 2015.  Patient expressed understanding and was in agreement with this plan. He also understands that He can call clinic at any time with any questions, concerns, or complaints.   Mayra Reel, NP   11/11/2015 11:42 AM    Patient was seen and evaluated independently and I agree with the assessment and plan as dictated above.  Lloyd Huger, MD 11/14/2015 7:36 PM

## 2015-11-11 NOTE — Progress Notes (Signed)
Patient states one month ago he was seen in the ED for diarrhea.  Referred back to GI doctor but unable to get appointment until the end of March.  Patient also recently seen by Neurologist and diagnosis changed to [place] body dementia .   Patient is also having night sweats.  Wife states he is also having hallucinations.

## 2015-11-13 ENCOUNTER — Other Ambulatory Visit: Payer: Self-pay | Admitting: Neurology

## 2015-11-13 ENCOUNTER — Other Ambulatory Visit: Payer: Self-pay | Admitting: Nurse Practitioner

## 2015-11-14 ENCOUNTER — Other Ambulatory Visit: Payer: Self-pay | Admitting: *Deleted

## 2015-11-14 MED ORDER — TRIAMTERENE-HCTZ 37.5-25 MG PO TABS: 0.5000 | ORAL_TABLET | Freq: Every day | ORAL | Status: DC

## 2015-11-14 NOTE — Telephone Encounter (Signed)
Klonopin Rx faxed to the [facility] on [address]. Received a receipt of confirmation.

## 2015-12-10 ENCOUNTER — Ambulatory Visit (INDEPENDENT_AMBULATORY_CARE_PROVIDER_SITE_OTHER): Payer: Medicare Other | Admitting: Gastroenterology

## 2015-12-10 ENCOUNTER — Encounter: Payer: Self-pay | Admitting: Gastroenterology

## 2015-12-10 VITALS — BP 116/70 | HR 68 | Ht 68.5 in | Wt 213.0 lb

## 2015-12-10 DIAGNOSIS — R197 Diarrhea, unspecified: Secondary | ICD-10-CM | POA: Diagnosis not present

## 2015-12-10 NOTE — Progress Notes (Signed)
Review of pertinent gastrointestinal problems: 1. Colon cancer:  T3N0 cecal adenocarcinoma s/p right hemicolectomy 7 2013, Dr. Wyn Forster. Original diagnosis 6 2013 Dr. Owens Loffler, also two adenomatous polyps were removed.  He was recommended to have repeat colonoscopy at one-year interval. A reminder letters were sent in 2014 and in 2015.  HPI: This is a  very pleasant 82 year old man     who was referred to me by Owens Loffler, MD  to evaluate  diarrhea, history of colon cancer .    Chief complaint is chronic loose stools, history of colon cancer  CT scan 09/2015: done for diarrhea, abd pain: There is a 16 mm cystic lesion arising from the ventral aspect of the proximal body of the pancreas (previously measured 13 mm in 2013) there is no dilatation of the main pancreatic duct or gland atrophy. This likely represents a benign process given relative interval stability. MRI may providebetter characterization.   Had an episode of abd pains, diarrhea and went to ER.  A CT scan was done (see above).  The pains and diarrhea lasted intermittently for several days.  He says he has no pains.  He has loose stools, takes imodium periodically.  He is stumbling a lot.   Diagnosed with lewey body dementia.  He is here with his wife today she gives a lot of history. He mainly wants talk about his low back pain. He does admit to having several loose stools daily can't say how far back as dates. No blood in stool.  Review of systems: Pertinent positive and negative review of systems were noted in the above HPI section. Complete review of systems was performed and was otherwise normal.   Past Medical History  Diagnosis Date  . GERD (gastroesophageal reflux disease)   . Pollen allergies   . Unspecified essential hypertension   . History of colon polyps   . History of gallstones   . Complication of anesthesia     severe confusion  . Major depressive disorder, recurrent episode, in partial remission  with anxious distress ([place])   . Hx of transfusion of platelets   . Lewy body dementia with behavioral disturbance   . Sleep apnea     "won't wear mask anymore" (12/22/2012)  . ITP (idiopathic thrombocytopenic purpura)     Dr. Grayland Ormond follows  . Kidney stones   . Skin cancer     ears  . Colon adenocarcinoma ([place])   . Pancreatitis 2/11    Past Surgical History  Procedure Laterality Date  . Cholecystectomy  2011  . Tonsillectomy and adenoidectomy  1946  . Total hip arthroplasty Left ~ 2002  . Lithotripsy  2008    x2  . Pilonidal cyst excision  1960's?  Marland Kitchen Upper gastrointestinal endoscopy    . Partial colectomy  03/21/2012    Procedure: PARTIAL COLECTOMY;  Surgeon: Harl Bowie, MD;  Location: [facility];  Service: General;  Laterality: Right;  . Inguinal hernia repair Left [id number]  . Hernia repair  12/22/2012    IHR w/mesh  . Esophageal dilation  1990's?  . Cardiac catheterization  2000's    "tx'd w/acid reflux RX" (12/22/2012)  . Skin cancer excision      "face; ?left ear" (12/22/2012)  . Ventral hernia repair N/A 12/22/2012    Procedure: HERNIA REPAIR INCISIONAL ;  Surgeon: Harl Bowie, MD;  Location: [facility];  Service: General;  Laterality: N/A;  . Insertion of mesh N/A 12/22/2012    Procedure: INSERTION OF  MESH;  Surgeon: Harl Bowie, MD;  Location: [facility];  Service: General;  Laterality: N/A;    Current Outpatient Prescriptions  Medication Sig Dispense Refill  . clonazePAM (KLONOPIN) 0.5 MG tablet take 1/2 tablet by mouth at bedtime 45 tablet 1  . donepezil (ARICEPT) 10 MG tablet take 1 tablet by mouth once daily 30 tablet 5  . omeprazole (PRILOSEC) 20 MG capsule Take 20 mg by mouth daily.    Marland Kitchen triamterene-hydrochlorothiazide (MAXZIDE-25) 37.5-25 MG tablet Take 0.5 tablets by mouth daily. 45 tablet 1   No current facility-administered medications for this visit.    Allergies as of 12/10/2015 - Review Complete 12/10/2015  Allergen Reaction Noted  . Aspirin   02/23/2013  . Chicken allergy  02/20/2012    Family History  Problem Relation Age of Onset  . Colon cancer Mother   . Heart disease Mother   . Hypertension Mother   . Alcohol abuse Father   . Esophageal cancer Neg Hx   . Rectal cancer Neg Hx   . Stomach cancer Neg Hx     Social History   Social History  . Marital Status: Married    Spouse Name: Charlett Nose  . Number of Children: 2  . Years of Education: college   Occupational History  .      Retired, Production manager, [employer]   Social History Main Topics  . Smoking status: Former Smoker    Types: Pipe  . Smokeless tobacco: Never Used     Comment: i4/06/2013 "only smoked pipe n college 1958"  . Alcohol Use: No  . Drug Use: No  . Sexual Activity: No   Other Topics Concern  . Not on file   Social History Narrative   Patient is married Charlett Nose) and lives with his wife.   Patient has two children and his wife has two children..   Patient is retired.   Patient has a college education.   Patient is right-handed.   Patient drinks 1-2 cups of coffee daily.     Physical Exam: BP 116/70 mmHg  Pulse 68  Ht 5' 8.5" (1.74 m)  Wt 213 lb (96.616 kg)  BMI 31.91 kg/m2 Constitutional: generally well-appearing Psychiatric: alert and oriented x3 Eyes: extraocular movements intact Mouth: oral pharynx moist, no lesions Neck: supple no lymphadenopathy Cardiovascular: heart regular rate and rhythm Lungs: clear to auscultation bilaterally Abdomen: soft, nontender, nondistended, no obvious ascites, no peritoneal signs, normal bowel sounds Extremities: no lower extremity edema bilaterally Skin: no lesions on visible extremities   Assessment and plan: 82 y.o. male with  Personal history of colon cancer, chronic loose stools, dementia  I did explain that colonoscopy for following colon cancer surgery is standard recommendations. He has not had colonoscopy since his 2013 right hemicolectomy. We sent him to let her about  this previously. He is not interested in having a colonoscopy now. Given his age, comorbidities especially his Parkinson's dementia I think it is not an unreasonable decision. He does understand and his wife understands that foregoing colonoscopy we could be overlooking something serious such as recurrent cancer. He prefers symptomatic control and I recommended a trial of scheduled Imodium one pill every morning and he can repeat this later on the day if he needs. If that fails I would try cholestyramine since he had a right hemicolectomy. The cystic lesion in the body of pancreas is 1.6 cm without any concerning features. I recommended follow-up imaging in one year from the original scan which would  be in 2018 January. He will return to see me in 3 months and sooner if needed.   Owens Loffler, MD [facility] 12/10/2015, 10:17 AM  Cc: Owens Loffler, MD

## 2015-12-10 NOTE — Patient Instructions (Signed)
MRI in 09/2016 for pancreatic cyst follow up. Colonoscopy following colon cancer surgery is generally recommended a year later and then 3 years after that.  Given your other issues, it is reasonable to not proceed with colonoscopy. Please start imodium one pill every morning after waking. Take another later on in the day if needed. Please return to see Dr. Ardis Hughs in 3 months.

## 2016-02-11 ENCOUNTER — Inpatient Hospital Stay: Payer: Medicare Other | Attending: Oncology

## 2016-02-19 DIAGNOSIS — M6281 Muscle weakness (generalized): Secondary | ICD-10-CM | POA: Diagnosis not present

## 2016-02-19 DIAGNOSIS — G309 Alzheimer's disease, unspecified: Secondary | ICD-10-CM | POA: Diagnosis not present

## 2016-02-19 DIAGNOSIS — F0281 Dementia in other diseases classified elsewhere with behavioral disturbance: Secondary | ICD-10-CM | POA: Diagnosis not present

## 2016-02-19 DIAGNOSIS — G3183 Dementia with Lewy bodies: Secondary | ICD-10-CM | POA: Diagnosis not present

## 2016-03-04 ENCOUNTER — Encounter: Payer: Self-pay | Admitting: Neurology

## 2016-03-04 ENCOUNTER — Ambulatory Visit (INDEPENDENT_AMBULATORY_CARE_PROVIDER_SITE_OTHER): Payer: Medicare Other | Admitting: Neurology

## 2016-03-04 VITALS — BP 110/78 | HR 70 | Resp 20 | Ht 68.0 in | Wt 220.0 lb

## 2016-03-04 DIAGNOSIS — H5316 Psychophysical visual disturbances: Secondary | ICD-10-CM

## 2016-03-04 DIAGNOSIS — F02818 Dementia in other diseases classified elsewhere, unspecified severity, with other behavioral disturbance: Secondary | ICD-10-CM

## 2016-03-04 DIAGNOSIS — F0281 Dementia in other diseases classified elsewhere with behavioral disturbance: Secondary | ICD-10-CM | POA: Diagnosis not present

## 2016-03-04 DIAGNOSIS — G218 Other secondary parkinsonism: Secondary | ICD-10-CM

## 2016-03-04 DIAGNOSIS — G3183 Dementia with Lewy bodies: Secondary | ICD-10-CM

## 2016-03-04 DIAGNOSIS — R441 Visual hallucinations: Secondary | ICD-10-CM

## 2016-03-04 MED ORDER — HYDROXYZINE HCL 10 MG PO TABS: ORAL_TABLET | ORAL | Status: DC

## 2016-03-04 MED ORDER — MEMANTINE HCL 28 X 5 MG & 21 X 10 MG PO TABS: ORAL_TABLET | ORAL | Status: DC

## 2016-03-04 NOTE — Progress Notes (Signed)
PATIENT: John Summers DOB: 1933/04/23  REASON FOR VISIT: follow up- lewy body dementia HISTORY FROM: patient and wife  HISTORY OF PRESENT ILLNESS: John Summers is seen 03-04-2016  in the presence of his wife, he is in physical therapy for his parkinsonian appearance and symptoms. His movements have slowed. He still and perhaps even increasing he has visual hallucinations but no auditory component results. People have moved into his home after the election and have not left. They're not talking and there are silently residing.  His movements have slowed he is slightly hunched and he is more rigid. He has increasingly frequently fallen. It is difficult for him to rise from a seated position and all this has been addressed with physical therapy in the hope of stalling with disease progression. He has started to fall backwards. His stance is not as steady and he seems to sway. His head this often dropped to his chest and he is looking at the floor while he walks. He cannot dress steadily, he is so extremely slow. His dysphonia is unchanged. Reduced facial expression, "dropped "open mouth. Appetite has remained strong, sleepiness is stronger but his wife is also reported that he seems to gurgle, and drool. He is also diaphoretic at night. His sleep cannot be evaluated in the lab, he has pseudobulbar affect, gets suddently tearful. PBA. He has no sign of PSP.     (MM) John Summers is an 82 year old male with a history of Lewy body dementia. He returns today for follow-up. The patient remains on Aricept 10 mg daily. He also takes Klonopin half a tablet at bedtime. The patient feels that his memory has gotten slightly worse. He requires assistance with ADLs such as dressing. He does not operate a motor vehicle. The patient does have trouble with his balance and is working with physical therapy currently. He does not use an assistive device when ambulating. The patient does have some anxiety. When he  becomes frustrated his tremor worsens and he will need to take deep breaths. These episodes only last for several seconds.  The patient continues to have hallucinations. His hallucinations are not violent or fearful. He states that he will see people in his home. He states it looks as if they're having conversation but he cannot hear them. He continues to take Klonopin at bedtime. His wife states that he sleeps very well at bedtime and patient agrees. Patient denies any changes with his appetite. He denies any new neurological symptoms. He returns today for an evaluation.  HISTORY 05/01/15: John Summers is an 82 year old male with a history of possible Lewy body dementia. He returns today for an evaluation. The patient is on Aricept 10 mg daily. He is tolerating this medication well. The patient also takes Klonopin 1/2 tablet at bedtime for REM sleep behavior disorder. At the last visit the patient was asked to start melatonin 5 mg in conjunction to the Klonopin. Wife reports that she could not remember the name of this medication and therefore they did not start it. The patient feels that his memory has gotten worse. He is able to complete all ADLs independently. He reports he does have trouble dressing but this is due to his balance. The patient has had 2 falls since the last visit. The patient does not operate a motor vehicle. His wife now completes all the finances. He does report visual hallucinations. His hallucinations primarily consist of people. He reports that the hallucinations are not violent or fearful. They  normally take place during the day rarely at night. The wife feels that his sleep is slightly better. He has been more calmer when he sleeps. The wife feels that he may be depressed. She states that he typically he has no motivation to do most things. She has encouraged him to complete basic needs such as bathing. Patient states that he does not want to start any medication for this.  REVIEW OF  SYSTEMS: Out of a complete 14 system review of symptoms, the patient complains only of the following symptoms, and all other reviewed systems are negative.  Visual hallucinations, body rigidity, stiffness, tendency to fall forward or backward, unsteadiness, trouble with saliva in the second half of the night sometimes drooling. Pseudobulbar affect, affect incontinence, symptoms confusing the names of his grandchildren.  ALLERGIES: Allergies  Allergen Reactions  . Aspirin   . Chicken Allergy     unknown    HOME MEDICATIONS: Outpatient Prescriptions Prior to Visit  Medication Sig Dispense Refill  . clonazePAM (KLONOPIN) 0.5 MG tablet take 1/2 tablet by mouth at bedtime 45 tablet 1  . donepezil (ARICEPT) 10 MG tablet take 1 tablet by mouth once daily 30 tablet 5  . omeprazole (PRILOSEC) 20 MG capsule Take 20 mg by mouth daily.    Marland Kitchen triamterene-hydrochlorothiazide (MAXZIDE-25) 37.5-25 MG tablet Take 0.5 tablets by mouth daily. 45 tablet 1   No facility-administered medications prior to visit.    PAST MEDICAL HISTORY: Past Medical History  Diagnosis Date  . GERD (gastroesophageal reflux disease)   . Pollen allergies   . Unspecified essential hypertension   . History of colon polyps   . History of gallstones   . Complication of anesthesia     severe confusion  . Major depressive disorder, recurrent episode, in partial remission with anxious distress ([place])   . Hx of transfusion of platelets   . Lewy body dementia with behavioral disturbance   . Sleep apnea     "won't wear mask anymore" (12/22/2012)  . ITP (idiopathic thrombocytopenic purpura)     Dr. Grayland Ormond follows  . Kidney stones   . Skin cancer     ears  . Colon adenocarcinoma ([place])   . Pancreatitis 2/11    PAST SURGICAL HISTORY: Past Surgical History  Procedure Laterality Date  . Cholecystectomy  2011  . Tonsillectomy and adenoidectomy  1946  . Total hip arthroplasty Left ~ 2002  . Lithotripsy  2008    x2  .  Pilonidal cyst excision  1960's?  Marland Kitchen Upper gastrointestinal endoscopy    . Partial colectomy  03/21/2012    Procedure: PARTIAL COLECTOMY;  Surgeon: Harl Bowie, MD;  Location: [facility];  Service: General;  Laterality: Right;  . Inguinal hernia repair Left [id number]  . Hernia repair  12/22/2012    IHR w/mesh  . Esophageal dilation  1990's?  . Cardiac catheterization  2000's    "tx'd w/acid reflux RX" (12/22/2012)  . Skin cancer excision      "face; ?left ear" (12/22/2012)  . Ventral hernia repair N/A 12/22/2012    Procedure: HERNIA REPAIR INCISIONAL ;  Surgeon: Harl Bowie, MD;  Location: [facility];  Service: General;  Laterality: N/A;  . Insertion of mesh N/A 12/22/2012    Procedure: INSERTION OF MESH;  Surgeon: Harl Bowie, MD;  [facility];  Service: General;  Laterality: N/A;    FAMILY HISTORY: Family History  Problem Relation Age of Onset  . Colon cancer Mother   . Heart disease  Mother   . Hypertension Mother   . Alcohol abuse Father   . Esophageal cancer Neg Hx   . Rectal cancer Neg Hx   . Stomach cancer Neg Hx     SOCIAL HISTORY: Social History   Social History  . Marital Status: Married    Spouse Name: Charlett Nose  . Number of Children: 2  . Years of Education: college   Occupational History  .      Retired, Production manager, [employer]   Social History Main Topics  . Smoking status: Former Smoker    Types: Pipe  . Smokeless tobacco: Never Used     Comment: i4/06/2013 "only smoked pipe n college 1958"  . Alcohol Use: No  . Drug Use: No  . Sexual Activity: No   Other Topics Concern  . Not on file   Social History Narrative   Patient is married Charlett Nose) and lives with his wife.   Patient has two children and his wife has two children..   Patient is retired.   Patient has a college education.   Patient is right-handed.   Patient drinks 1-2 cups of coffee daily.      PHYSICAL EXAM  Filed Vitals:   03/04/16 1054  BP: 110/78  Pulse:  70  Resp: 20  Height: 5\' 8"  (1.727 m)  Weight: 220 lb (99.791 kg)   Body mass index is 33.46 kg/(m^2).   MMSE - Mini Mental State Exam 03/04/2016 11/05/2015 05/01/2015  Orientation to time 3 2 1   Orientation to Place 3 2 2   Registration 3 3 3   Attention/ Calculation 1 0 0  Recall 1 0 3  Language- name 2 objects 2 2 2   Language- repeat 0 1 1  Language- follow 3 step command 3 2 3   Language- read & follow direction 1 1 1   Write a sentence 1 0 0  Copy design 0 1 1  Total score 18 14 17      Generalized: Well developed, in no acute distress   Neurological examination  Mentation: Alert.. Follows all commands speech and language fluent Cranial nerve  Pupils were equal round reactive to light. Extraocular movements were full, there is nystagmus with gaze to the left. Horizontal and vertical eye movements are otherwise intact, there is no sign of progressive supranuclear palsy.  Facial sensation  Normal. He tends to have his mouth prop open and he drools at night, Uvula is in midline as is tongue midline. Head turning and shoulder shrug  were normal and symmetric. His chin drops to his chest. His posture is hunched. There is more rigidity over his motor system throughout. He also presents with a pseudobulbar affect - easily is tearful. Motor: The motor testing reveals 5 / 5 strength of all 4 extremities.  Good symmetric motor tone is noted throughout.  Intermittent resting tremor in both hands Sensory: Sensory testing is intact to soft touch on all 4 extremities. No evidence of extinction is noted.  Coordination: Cerebellar testing reveals good finger-nose-finger and heel-to-shin bilaterally.  Gait and station:  Patient has a slight shuffling gait. He does require assistance with standing. He cannot stand up from a seated position without bracing and assistance.  Tandem gait not attempted. Decreased armswing bilaterally. Reflexes: Deep tendon reflexes are brisk, symmetric bilaterally.    DIAGNOSTIC DATA (LABS, IMAGING, TESTING) - I reviewed patient records, labs, notes, testing and imaging myself where available.  Lab Results  Component Value Date   WBC 2.7* 11/04/2015  HGB 12.9* 11/04/2015   HCT 37.3* 11/04/2015   MCV 103.8* 11/04/2015   PLT 86* 11/04/2015    ASSESSMENT AND PLAN 82 y.o. year old male  has a past medical history of GERD (gastroesophageal reflux disease); Pollen allergies; Unspecified essential hypertension; History of colon polyps; History of gallstones; Complication of anesthesia; Major depressive disorder, recurrent episode, in partial remission with anxious distress ([place]); transfusion of platelets; Lewy body dementia with behavioral disturbance; Sleep apnea; ITP (idiopathic thrombocytopenic purpura); Kidney stones; Skin cancer; Colon adenocarcinoma ([place]); and Pancreatitis (2/11). here with:  1. Dementia with Lewy bodies, hallucinations beginning in the late afternoon.  Not controlled. No auditory component, day to day variablility - visual only.  Hesitant to use neuroleptic - WBC is decreased.   [name] memory by  MMSE today is  18/30  from  14/30 , and previously 17/30.  He will continue on Aricept 10 mg daily. He will also continue on Klonopin half a tablet at bedtime.   He reports people silently living in his house.  We discussed possibly starting Namenda. The patient in wife will let me know if they decide to begin this. Patient and his wife advised that  Symptoms have progressed  -develops any new symptoms he should let us know.    He will follow-up in 6 months with Colon Branch, MD   03/04/2016, 11:13 AM [facility] [address] [place], [place] [address]   [phone]   CC Hematologist Dr Dartha Lodge, [facility]

## 2016-03-04 NOTE — Patient Instructions (Signed)
Dementia With Lewy Bodies Dementia with Lewy bodies is a common type of dementia that gets progressively worse with time. Typical characteristics include progressive worsening of mental function combined with 3 other features: seeing things that are not there (hallucinations), significant fluctuations in alertness and attention, and changes in movement similar to Parkinson's disease (rigid muscles, slowed movement, and tremors). Dementia with Lewy bodies has many similar symptoms that Parkinson's disease and Alzheimer's disease has. CAUSES The symptoms of dementia with Lewy bodies are caused by the buildup of Lewy bodies, which are proteins that build up in brain cells and affect mental function and movement. No one knows exactly what causes the buildup of Lewy bodies, but it may be related to Alzheimer's dementia or Parkinson's disease.  SYMPTOMS   Memory problems.  Confusion.  Reduced attention span.  Hallucinations.  False ideas about another person or situation (delusions).  Trouble moving (rigid muscles, slowed movement, and tremors).  Shuffling movements (gait).  Sleep problems (acting out dreams).  Fluctuating attention (periods of drowsiness or lethargy, long periods of time spent staring into space, or disorganized speech). DIAGNOSIS There is no specific test to diagnose dementia with Lewy bodies, but your caregiver will evaluate you based on your symptoms and physical exam. Your evaluation may also include:  Memory testing.  Lab tests.  Brain imaging (CT scan, MRI).  Electroencephalogram (EEG).  Lumbar puncture. TREATMENT  There is no cure for dementia with Lewy bodies. Medicines may be prescribed to help with mental function and movement.  HOME CARE INSTRUCTIONS The care of individuals with dementia is varied and dependent upon the progression of the dementia. The following suggestions are intended for the person living with, or caring for, the person with  dementia.  Create a safe environment.  Remove the locks on bathroom doors to prevent the person from accidentally locking himself or herself in.  Use childproof latches on kitchen cabinets and any place where cleaning supplies, chemicals, or alcohol are kept.  Use childproof covers in unused electrical outlets.  Install childproof devices to keep doors and windows secured.  Remove stove knobs or install safety knobs and an automatic shut-off on the stove.  Lower the temperature on water heaters.  Label medicines and keep them locked up.  Secure knives, lighters, matches, power tools, and guns, and keep these items out of reach.  Keep the house free from clutter. Remove rugs or anything that might contribute to a fall.  Remove objects that might break and hurt the person.  Make sure lighting is good, both inside and outside.  Install grab rails as needed.  Use a monitoring device to alert you to falls or other needs for help.  Reduce confusion.  Keep familiar objects and people around.  Use night lights or dim lights at night.  Label items or areas.  Use reminders, notes, or directions for daily activities or tasks.  Keep a simple, consistent routine for waking, meals, bathing, dressing, and bedtime.  Create a calm, quiet environment.  Place large clocks and calendars prominently.  Display emergency numbers and the home address near all telephones.  Use cues to establish different times of the day. An example is to open curtains to let the natural light in during the day.  Use effective communication.  Choose simple words and short sentences.  Use a gentle, calm tone of voice.  Be careful not to interrupt.  If the person is struggling to find a word or communicate a thought, try to provide the   word or thought.  Ask one question at a time. Allow the person ample time to answer questions. Repeat the question again if the person does not respond.  Reduce  nighttime restlessness.  Provide a comfortable bed.  Have a consistent nighttime routine.  Ensure a regular walking or physical activity schedule. Involve the person in daily activities as much as possible.  Limit napping during the day.  Limit caffeine.  Attend social events that stimulate rather than overwhelm the senses.  Encourage good nutrition and hydration.  Reduce distractions during meal times and snacks.  Avoid foods that are too hot or too cold.  Monitor chewing and swallowing ability.  Continue with routine vision, hearing, dental, and medical screenings.  Only give over-the-counter or prescription medicines as directed by the caregiver.  Monitor driving abilities. Do not allow the person to drive when safe driving is no longer possible.  Register with an identification program which could provide location assistance in the event of a missing person situation. SEEK MEDICAL CARE IF:  New behavioral problems develop, such as moodiness or aggressiveness.  Any new problem with brain function develop, such as balance, speech, or falling a lot.  Problems with swallowing develop. Small changes or worsening in any aspect of brain function can be a sign that the illness is getting worse. It can also be a sign of another medical illness such as infection. Seeing a caregiver right away is important.  SEEK IMMEDIATE MEDICAL CARE IF:  A fever develops.  Confusion develops or worsens.  New or worsened sleepiness develops or staying awake becomes difficult.   This information is not intended to replace advice given to you by your health care provider. Make sure you discuss any questions you have with your health care provider.   Document Released: 05/23/2002 Document Revised: 11/23/2011 Document Reviewed: 04/27/2011 Elsevier Interactive Patient Education [employer].

## 2016-04-24 ENCOUNTER — Observation Stay
Admission: EM | Admit: 2016-04-24 | Discharge: 2016-04-25 | Disposition: A | Discharge status: Home / Self Care | Payer: Medicare Other | Attending: Internal Medicine | Admitting: Internal Medicine

## 2016-04-24 ENCOUNTER — Emergency Department: Payer: Medicare Other

## 2016-04-24 DIAGNOSIS — Z79899 Other long term (current) drug therapy: Secondary | ICD-10-CM | POA: Insufficient documentation

## 2016-04-24 DIAGNOSIS — Z886 Allergy status to analgesic agent status: Secondary | ICD-10-CM | POA: Insufficient documentation

## 2016-04-24 DIAGNOSIS — Z85038 Personal history of other malignant neoplasm of large intestine: Secondary | ICD-10-CM | POA: Insufficient documentation

## 2016-04-24 DIAGNOSIS — Z96642 Presence of left artificial hip joint: Secondary | ICD-10-CM | POA: Insufficient documentation

## 2016-04-24 DIAGNOSIS — K449 Diaphragmatic hernia without obstruction or gangrene: Secondary | ICD-10-CM | POA: Insufficient documentation

## 2016-04-24 DIAGNOSIS — D693 Immune thrombocytopenic purpura: Secondary | ICD-10-CM | POA: Diagnosis not present

## 2016-04-24 DIAGNOSIS — N400 Enlarged prostate without lower urinary tract symptoms: Secondary | ICD-10-CM | POA: Insufficient documentation

## 2016-04-24 DIAGNOSIS — Z87442 Personal history of urinary calculi: Secondary | ICD-10-CM | POA: Diagnosis not present

## 2016-04-24 DIAGNOSIS — K5641 Fecal impaction: Secondary | ICD-10-CM | POA: Diagnosis not present

## 2016-04-24 DIAGNOSIS — K862 Cyst of pancreas: Secondary | ICD-10-CM

## 2016-04-24 DIAGNOSIS — I700 Atherosclerosis of aorta: Secondary | ICD-10-CM | POA: Insufficient documentation

## 2016-04-24 DIAGNOSIS — K219 Gastro-esophageal reflux disease without esophagitis: Secondary | ICD-10-CM | POA: Insufficient documentation

## 2016-04-24 DIAGNOSIS — K432 Incisional hernia without obstruction or gangrene: Secondary | ICD-10-CM | POA: Insufficient documentation

## 2016-04-24 DIAGNOSIS — I10 Essential (primary) hypertension: Secondary | ICD-10-CM | POA: Diagnosis not present

## 2016-04-24 DIAGNOSIS — F3341 Major depressive disorder, recurrent, in partial remission: Secondary | ICD-10-CM | POA: Diagnosis not present

## 2016-04-24 DIAGNOSIS — F0281 Dementia in other diseases classified elsewhere with behavioral disturbance: Secondary | ICD-10-CM | POA: Diagnosis not present

## 2016-04-24 DIAGNOSIS — Z85828 Personal history of other malignant neoplasm of skin: Secondary | ICD-10-CM | POA: Insufficient documentation

## 2016-04-24 DIAGNOSIS — K625 Hemorrhage of anus and rectum: Secondary | ICD-10-CM | POA: Insufficient documentation

## 2016-04-24 DIAGNOSIS — G4730 Sleep apnea, unspecified: Secondary | ICD-10-CM | POA: Insufficient documentation

## 2016-04-24 DIAGNOSIS — Z87891 Personal history of nicotine dependence: Secondary | ICD-10-CM | POA: Diagnosis not present

## 2016-04-24 DIAGNOSIS — G3183 Dementia with Lewy bodies: Secondary | ICD-10-CM | POA: Diagnosis not present

## 2016-04-24 DIAGNOSIS — G4752 REM sleep behavior disorder: Secondary | ICD-10-CM | POA: Diagnosis not present

## 2016-04-24 DIAGNOSIS — I2510 Atherosclerotic heart disease of native coronary artery without angina pectoris: Secondary | ICD-10-CM | POA: Diagnosis not present

## 2016-04-24 DIAGNOSIS — Z9049 Acquired absence of other specified parts of digestive tract: Secondary | ICD-10-CM | POA: Insufficient documentation

## 2016-04-24 DIAGNOSIS — F02818 Dementia in other diseases classified elsewhere, unspecified severity, with other behavioral disturbance: Secondary | ICD-10-CM | POA: Diagnosis present

## 2016-04-24 DIAGNOSIS — Z91018 Allergy to other foods: Secondary | ICD-10-CM | POA: Insufficient documentation

## 2016-04-24 DIAGNOSIS — K5730 Diverticulosis of large intestine without perforation or abscess without bleeding: Secondary | ICD-10-CM | POA: Insufficient documentation

## 2016-04-24 DIAGNOSIS — Z86010 Personal history of colonic polyps: Secondary | ICD-10-CM | POA: Insufficient documentation

## 2016-04-24 DIAGNOSIS — R1084 Generalized abdominal pain: Secondary | ICD-10-CM

## 2016-04-24 DIAGNOSIS — E876 Hypokalemia: Secondary | ICD-10-CM | POA: Diagnosis not present

## 2016-04-24 DIAGNOSIS — K8689 Other specified diseases of pancreas: Secondary | ICD-10-CM | POA: Diagnosis present

## 2016-04-24 DIAGNOSIS — Z800 Family history of malignant neoplasm of digestive organs: Secondary | ICD-10-CM | POA: Insufficient documentation

## 2016-04-24 DIAGNOSIS — R195 Other fecal abnormalities: Secondary | ICD-10-CM | POA: Diagnosis present

## 2016-04-24 DIAGNOSIS — Z8249 Family history of ischemic heart disease and other diseases of the circulatory system: Secondary | ICD-10-CM | POA: Insufficient documentation

## 2016-04-24 DIAGNOSIS — Z811 Family history of alcohol abuse and dependence: Secondary | ICD-10-CM | POA: Insufficient documentation

## 2016-04-24 HISTORY — DX: Essential (primary) hypertension: I10

## 2016-04-24 LAB — COMPREHENSIVE METABOLIC PANEL
ALK PHOS: 50 U/L (ref 38–126)
ALT: 18 U/L (ref 17–63)
AST: 19 U/L (ref 15–41)
Albumin: 3.8 g/dL (ref 3.5–5.0)
Anion gap: 7 (ref 5–15)
BUN: 22 mg/dL — ABNORMAL HIGH (ref 6–20)
CALCIUM: 8.8 mg/dL — AB (ref 8.9–10.3)
CO2: 26 mmol/L (ref 22–32)
CREATININE: 0.89 mg/dL (ref 0.61–1.24)
Chloride: 108 mmol/L (ref 101–111)
GFR calc non Af Amer: >60 mL/min (ref >60)
Glucose, Bld: 104 mg/dL — ABNORMAL HIGH (ref 65–99)
Potassium: 3.6 mmol/L (ref 3.5–5.1)
SODIUM: 141 mmol/L (ref 135–145)
Total Bilirubin: 0.6 mg/dL (ref 0.3–1.2)
Total Protein: 7.2 g/dL (ref 6.5–8.1)

## 2016-04-24 LAB — LIPASE, BLOOD: Lipase: 30 U/L (ref 11–51)

## 2016-04-24 LAB — URINALYSIS COMPLETE WITH MICROSCOPIC (ARMC ONLY)
BACTERIA UA: NONE SEEN
BILIRUBIN URINE: NEGATIVE
GLUCOSE, UA: NEGATIVE mg/dL
HGB URINE DIPSTICK: NEGATIVE
Ketones, ur: NEGATIVE mg/dL
Leukocytes, UA: NEGATIVE
NITRITE: NEGATIVE
Protein, ur: NEGATIVE mg/dL
Specific Gravity, Urine: 1.027 (ref 1.005–1.030)
pH: 5 (ref 5.0–8.0)

## 2016-04-24 LAB — PROTIME-INR
INR: 1.11
PROTHROMBIN TIME: 14.3 s (ref 11.4–15.2)

## 2016-04-24 LAB — CBC WITH DIFFERENTIAL/PLATELET
BASOS PCT: 0 %
Basophils Absolute: 0 10*3/uL (ref 0–0.1)
EOS ABS: 0 10*3/uL (ref 0–0.7)
EOS PCT: 0 %
HEMATOCRIT: 37.7 % — AB (ref 40.0–52.0)
Hemoglobin: 13.2 g/dL (ref 13.0–18.0)
Lymphocytes Relative: 22 %
Lymphs Abs: 0.7 10*3/uL — ABNORMAL LOW (ref 1.0–3.6)
MCH: 36.1 pg — ABNORMAL HIGH (ref 26.0–34.0)
MCHC: 35.1 g/dL (ref 32.0–36.0)
MCV: 103 fL — ABNORMAL HIGH (ref 80.0–100.0)
MONO ABS: 0.4 10*3/uL (ref 0.2–1.0)
MONOS PCT: 12 %
Neutro Abs: 2.2 10*3/uL (ref 1.4–6.5)
Neutrophils Relative %: 66 %
Platelets: 102 10*3/uL — ABNORMAL LOW (ref 150–440)
RBC: 3.66 MIL/uL — ABNORMAL LOW (ref 4.40–5.90)
RDW: 13.6 % (ref 11.5–14.5)
WBC: 3.3 10*3/uL — ABNORMAL LOW (ref 3.8–10.6)

## 2016-04-24 LAB — TROPONIN I: Troponin I: <0.03 ng/mL (ref <0.03)

## 2016-04-24 MED ORDER — IOPAMIDOL (ISOVUE-300) INJECTION 61%
100.0000 mL | Freq: Once | INTRAVENOUS | Status: AC | PRN
  Administered 2016-04-24: 100 mL via INTRAVENOUS

## 2016-04-24 MED ORDER — SODIUM CHLORIDE 0.9 % IV SOLN
INTRAVENOUS | Status: AC
  Administered 2016-04-24: 22:00:00 via INTRAVENOUS

## 2016-04-24 MED ORDER — CLONAZEPAM 0.5 MG PO TABS
0.2500 mg | ORAL_TABLET | Freq: Every day | ORAL | Status: DC
  Administered 2016-04-24: 0.25 mg via ORAL
  Filled 2016-04-24: qty 1

## 2016-04-24 MED ORDER — ACETAMINOPHEN 325 MG PO TABS
650.0000 mg | ORAL_TABLET | Freq: Four times a day (QID) | ORAL | Status: DC | PRN
  Administered 2016-04-24: 23:00:00 650 mg via ORAL
  Filled 2016-04-24: qty 2

## 2016-04-24 MED ORDER — ONDANSETRON HCL 4 MG/2ML IJ SOLN: 4.0000 mg | Freq: Four times a day (QID) | INTRAMUSCULAR | Status: DC | PRN

## 2016-04-24 MED ORDER — SODIUM CHLORIDE 0.9% FLUSH
3.0000 mL | Freq: Two times a day (BID) | INTRAVENOUS | Status: DC
  Administered 2016-04-24 – 2016-04-25 (×2): 3 mL via INTRAVENOUS

## 2016-04-24 MED ORDER — ACETAMINOPHEN 650 MG RE SUPP: 650.0000 mg | Freq: Four times a day (QID) | RECTAL | Status: DC | PRN

## 2016-04-24 MED ORDER — DONEPEZIL HCL 5 MG PO TABS
10.0000 mg | ORAL_TABLET | Freq: Every day | ORAL | Status: DC
  Administered 2016-04-24 – 2016-04-25 (×2): 10 mg via ORAL
  Filled 2016-04-24 (×2): qty 2

## 2016-04-24 MED ORDER — DIATRIZOATE MEGLUMINE & SODIUM 66-10 % PO SOLN
15.0000 mL | Freq: Once | ORAL | Status: AC
  Administered 2016-04-24: 15 mL via ORAL

## 2016-04-24 MED ORDER — ONDANSETRON HCL 4 MG PO TABS: 4.0000 mg | ORAL_TABLET | Freq: Four times a day (QID) | ORAL | Status: DC | PRN

## 2016-04-24 MED ORDER — HYDROXYZINE HCL 10 MG PO TABS
10.0000 mg | ORAL_TABLET | Freq: Every day | ORAL | Status: DC
  Administered 2016-04-24: 23:00:00 10 mg via ORAL
  Filled 2016-04-24 (×2): qty 1

## 2016-04-24 MED ORDER — OXYCODONE HCL 5 MG PO TABS
5.0000 mg | ORAL_TABLET | ORAL | Status: DC | PRN
  Filled 2016-04-24: qty 1

## 2016-04-24 NOTE — H&P (Signed)
[facility] NAME: John Summers    MR#:  [id number]  DATE OF BIRTH:  02-04-1933  DATE OF ADMISSION:  04/24/2016  PRIMARY CARE PHYSICIAN: Owens Loffler, MD   REQUESTING/REFERRING PHYSICIAN: Reita Cliche, MD  CHIEF COMPLAINT:   Chief Complaint  Patient presents with  . Rectal Bleeding    HISTORY OF PRESENT ILLNESS:  John Summers  is a 82 y.o. male who presents with Severe abdominal pain and equal impaction. He came to the ED for evaluation for the same. Here he was noted to have bleeding per rectum when he was disimpacted and his stool was Hemoccult positive. CT abdomen and pelvis also showed a pancreatic mass which was known, but has grown in size since his prior CT. Hospitals were called for admission and further evaluation.  PAST MEDICAL HISTORY:   Past Medical History:  Diagnosis Date  . Colon adenocarcinoma ([place])   . Complication of anesthesia    severe confusion  . GERD (gastroesophageal reflux disease)   . History of colon polyps   . History of gallstones   . Hx of transfusion of platelets   . Hypertension   . ITP (idiopathic thrombocytopenic purpura)    Dr. Grayland Ormond follows  . Kidney stones   . Lewy body dementia with behavioral disturbance   . Major depressive disorder, recurrent episode, in partial remission with anxious distress ([place])   . Pancreatitis 2/11  . Pollen allergies   . Skin cancer    ears  . Sleep apnea    "won't wear mask anymore" (12/22/2012)  . Unspecified essential hypertension     PAST SURGICAL HISTORY:   Past Surgical History:  Procedure Laterality Date  . CARDIAC CATHETERIZATION  2000's   "tx'd w/acid reflux RX" (12/22/2012)  . CHOLECYSTECTOMY  2011  . ESOPHAGEAL DILATION  1990's?  Marland Kitchen HERNIA REPAIR  12/22/2012   IHR w/mesh  . INGUINAL HERNIA REPAIR Left [id number]  . INSERTION OF MESH N/A 12/22/2012   Procedure: INSERTION OF MESH;  Surgeon: Harl Bowie, MD;  Location: [facility];  Service: General;  Laterality: N/A;  . LITHOTRIPSY  2008   x2  . PARTIAL COLECTOMY  03/21/2012   Procedure: PARTIAL COLECTOMY;  Surgeon: Harl Bowie, MD;  Location: [facility];  Service: General;  Laterality: Right;  . PILONIDAL CYST EXCISION  1960's?  Marland Kitchen SKIN CANCER EXCISION     "face; ?left ear" (12/22/2012)  . TONSILLECTOMY AND ADENOIDECTOMY  1946  . TOTAL HIP ARTHROPLASTY Left ~ 2002  . UPPER GASTROINTESTINAL ENDOSCOPY    . VENTRAL HERNIA REPAIR N/A 12/22/2012   Procedure: HERNIA REPAIR INCISIONAL ;  Surgeon: Harl Bowie, MD;  Location: [facility];  Service: General;  Laterality: N/A;    SOCIAL HISTORY:   Social History  Substance Use Topics  . Smoking status: Former Smoker    Types: Pipe  . Smokeless tobacco: Never Used     Comment: i4/06/2013 "only smoked pipe n college 1958"  . Alcohol use No    FAMILY HISTORY:   Family History  Problem Relation Age of Onset  . Colon cancer Mother   . Heart disease Mother   . Hypertension Mother   . Alcohol abuse Father   . Esophageal cancer Neg Hx   . Rectal cancer Neg Hx   . Stomach cancer Neg Hx     DRUG ALLERGIES:   Allergies  Allergen Reactions  . Aspirin   . Chicken Allergy  unknown    MEDICATIONS AT HOME:   Prior to Admission medications   Medication Sig Start Date End Date Taking? Authorizing Provider  clonazePAM (KLONOPIN) 0.5 MG tablet take 1/2 tablet by mouth at bedtime 11/14/15  Yes Larey Seat, MD  donepezil (ARICEPT) 10 MG tablet take 1 tablet by mouth once daily 11/13/15  Yes Ward Givens, NP  hydrOXYzine (ATARAX/VISTARIL) 10 MG tablet Take 10 mg Hydroxacen at night to suppress the skin scratching 03/04/16  Yes Larey Seat, MD  triamterene-hydrochlorothiazide (MAXZIDE-25) 37.5-25 MG tablet Take 0.5 tablets by mouth daily. Patient taking differently: Take 0.5 tablets by mouth at bedtime.  11/14/15  Yes Owens Loffler, MD    REVIEW OF SYSTEMS:  Review of Systems  Constitutional: Negative for  chills, fever, malaise/fatigue and weight loss.  HENT: Negative for ear pain, hearing loss and tinnitus.   Eyes: Negative for blurred vision, double vision, pain and redness.  Respiratory: Negative for cough, hemoptysis and shortness of breath.   Cardiovascular: Negative for chest pain, palpitations, orthopnea and leg swelling.  Gastrointestinal: Positive for abdominal pain (low abdomen and rectum) and blood in stool. Negative for constipation, diarrhea, nausea and vomiting.  Genitourinary: Negative for dysuria, frequency and hematuria.  Musculoskeletal: Negative for back pain, joint pain and neck pain.  Skin:       No acne, rash, or lesions  Neurological: Negative for dizziness, tremors, focal weakness and weakness.  Endo/Heme/Allergies: Negative for polydipsia. Does not bruise/bleed easily.  Psychiatric/Behavioral: Negative for depression. The patient is not nervous/anxious and does not have insomnia.      VITAL SIGNS:   Vitals:   04/24/16 1730 04/24/16 1800 04/24/16 1830 04/24/16 2000  BP: 129/76 109/72 111/62 107/67  Pulse: 77 77 79 81  Resp:  16 16 16   Temp:      TempSrc:      SpO2: 98% 100% 96% 94%  Weight:      Height:       Wt Readings from Last 3 Encounters:  04/24/16 99.2 kg (218 lb 11.2 oz)  03/04/16 99.8 kg (220 lb)  12/10/15 96.6 kg (213 lb)    PHYSICAL EXAMINATION:  Physical Exam  Vitals reviewed. Constitutional: He is oriented to person, place, and time. He appears well-developed and well-nourished. No distress.  HENT:  Head: Normocephalic and atraumatic.  Mouth/Throat: Oropharynx is clear and moist.  Eyes: Conjunctivae and EOM are normal. Pupils are equal, round, and reactive to light. No scleral icterus.  Neck: Normal range of motion. Neck supple. No JVD present. No thyromegaly present.  Cardiovascular: Normal rate, regular rhythm and intact distal pulses.  Exam reveals no gallop and no friction rub.   No murmur heard. Respiratory: Effort normal and  breath sounds normal. No respiratory distress. He has no wheezes. He has no rales.  GI: Soft. Bowel sounds are normal. He exhibits no distension. There is no tenderness.  Musculoskeletal: Normal range of motion. He exhibits no edema.  No arthritis, no gout  Lymphadenopathy:    He has no cervical adenopathy.  Neurological: He is alert and oriented to person, place, and time. No cranial nerve deficit.  No dysarthria, no aphasia  Skin: Skin is warm and dry. No rash noted. No erythema.  Psychiatric: He has a normal mood and affect. His behavior is normal. Judgment and thought content normal.    LABORATORY PANEL:   CBC  Recent Labs Lab 04/24/16 1617  WBC 3.3*  HGB 13.2  HCT 37.7*  PLT 102*   ------------------------------------------------------------------------------------------------------------------  Chemistries  Recent Labs Lab 04/24/16 1617  NA 141  K 3.6  CL 108  CO2 26  GLUCOSE 104*  BUN 22*  CREATININE 0.89  CALCIUM 8.8*  AST 19  ALT 18  ALKPHOS 50  BILITOT 0.6   ------------------------------------------------------------------------------------------------------------------  Cardiac Enzymes  Recent Labs Lab 04/24/16 1617  TROPONINI <0.03   ------------------------------------------------------------------------------------------------------------------  RADIOLOGY:  Ct Abdomen Pelvis W Contrast  Result Date: 04/24/2016 CLINICAL DATA:  Generalized abdominal pain.  Rectal bleeding. EXAM: CT ABDOMEN AND PELVIS WITH CONTRAST TECHNIQUE: Multidetector CT imaging of the abdomen and pelvis was performed using the standard protocol following bolus administration of intravenous contrast. CONTRAST:  126mL ISOVUE-300 IOPAMIDOL (ISOVUE-300) INJECTION 61% COMPARISON:  09/30/2015 CT abdomen/pelvis. FINDINGS: Lower chest: No significant pulmonary nodules or acute consolidative airspace disease. Coronary atherosclerosis. Hepatobiliary: Normal liver with no liver mass.  Cholecystectomy. No intrahepatic biliary ductal dilatation. Common bile duct diameter 9 mm, within expected post cholecystectomy limits. No radiopaque choledocholithiasis. Pancreas: Cystic 2.3 cm pancreatic body mass (series 2/image 18), increased in size from 1.6 cm on 09/30/2015. No additional pancreatic masses. Stable mildly dilated ventral pancreatic duct in the pancreatic head (4 mm diameter). Spleen: Normal size. No mass. Adrenals/Urinary Tract: Normal adrenals. Subcentimeter hypodense renal cortical lesion in the lateral interpolar right kidney, too small to characterize, unchanged, most consistent with a benign renal cyst. Otherwise normal kidneys, with no hydronephrosis. Bladder visualization is limited by streak artifact from the left hip hardware, with no gross bladder abnormality. Stomach/Bowel: Small to moderate hiatal hernia. Otherwise grossly normal stomach. Large ventral abdominal wall midline hernia containing numerous small bowel loops has increased in size. No small bowel dilatation, pneumatosis or wall thickening. Status post subtotal right hemicolectomy with intact appearing ileocolic anastomosis in the right upper abdomen. Mild sigmoid diverticulosis. There is nonspecific circumferential wall thickening and mucosal hyperenhancement throughout the rectum with associated faint perirectal fat haziness. Vascular/Lymphatic: Atherosclerotic nonaneurysmal abdominal aorta. Patent portal, splenic, hepatic and renal veins. 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 No pathologically enlarged lymph nodes in the abdomen or pelvis. Reproductive: Moderately enlarged prostate, unchanged. Other: No pneumoperitoneum, ascites or focal fluid collection. Musculoskeletal: No aggressive appearing focal osseous lesions. Left total hip arthroplasty. Marked thoracolumbar spondylosis. IMPRESSION: 1. Nonspecific circumferential wall thickening and mucosal hyperenhancement throughout the rectum with associated  faint perirectal fat haziness, most suggestive of an infectious or inflammatory proctitis, although a rectal neoplasm can have a similar appearance. Clinical correlation is necessary. Consider endoscopic evaluation as clinically warranted. 2. Interval enlargement of large midline ventral abdominal wall hernia containing small bowel loops. No findings to suggest small bowel obstruction or ischemia. 3. Interval growth of 2.3 cm cystic pancreatic body mass. Stable mild dilatation of the ventral pancreatic duct. MRI of the abdomen without and with IV contrast is recommended on a short term outpatient basis for further characterization, as an enlarging cystic pancreatic neoplasm cannot be excluded. 4. Additional findings include aortic atherosclerosis, coronary atherosclerosis, small to moderate hiatal hernia and moderate prostatomegaly. Electronically Signed   By: Ilona Sorrel M.D.   On: 04/24/2016 18:38    EKG:   Orders placed or performed during the hospital encounter of 04/24/16  . ED EKG  . ED EKG  . EKG 12-Lead  . EKG 12-Lead  . EKG 12-Lead  . EKG 12-Lead    IMPRESSION AND PLAN:  Principal Problem:   Blood per rectum - very possibly related to inflammation from his fecal impaction, but we will have GI see him tomorrow for further  recommendation. He does have a history of GI cancer Active Problems:   Pancreatic mass - we will wait for GIs recommendations in terms of diagnosing this mass.   Essential hypertension - stable, continue home meds   Lewy body dementia with behavioral disturbance - continue home meds  All the records are reviewed and case discussed with ED provider. Management plans discussed with the patient and/or family.  DVT PROPHYLAXIS: Mechanical only  GI PROPHYLAXIS: None  ADMISSION STATUS: Observation  CODE STATUS: Full Code Status History    Date Active Date Inactive Code Status Order ID Comments User Context   12/22/2012 12:33 PM 12/23/2012  1:59 PM Full Code  [id number]  Harl Bowie, MD Inpatient   03/21/2012  2:57 PM 03/28/2012  8:18 PM Full Code [id number]  Suezanne Cheshire, RN Inpatient    Advance Directive Documentation   Flowsheet Row Most Recent Value  Type of Advance Directive  Healthcare Power of Attorney  Pre-existing out of facility DNR order (yellow form or pink MOST form)  No data  "MOST" Form in Place?  No data      TOTAL TIME TAKING CARE OF THIS PATIENT: 45 minutes.    Sareen Randon Buckland 04/24/2016, 8:45 PM  [facility]  Office  [phone]  CC: Primary care physician; Owens Loffler, MD

## 2016-04-24 NOTE — ED Triage Notes (Signed)
Pt to ED via EMS from home c/o rectal bleeding. Per EMS pt had dark stools, pt reported to EMS having a couple of bowel movements today. Per EMS pt has hx of colon cancer. EMS VSS  BP 136/72, HR 78, 96% RA. Pt alert and oriented to self in no acute distress at this time.

## 2016-04-24 NOTE — ED Notes (Signed)
Transport to 108

## 2016-04-24 NOTE — ED Provider Notes (Signed)
[facility] Emergency Department Provider Note ____________________________________________   I have reviewed the triage vital signs and the triage nursing note.  HISTORY  Chief Complaint Rectal Bleeding   Historian Level V caveat, poor historian Report from EMS  HPI John Summers is a 82 y.o. male with a history of colon cancer, states he is here for rectal pain. Patient states that he has constipation. Report from EMS was that the patient has had several bloody bowel movements since this afternoon.  Patient states he is in all moderate to severe pain. He does have a history noted of Lewy body dementia.    Past Medical History:  Diagnosis Date  . Colon adenocarcinoma ([place])   . Complication of anesthesia    severe confusion  . GERD (gastroesophageal reflux disease)   . History of colon polyps   . History of gallstones   . Hx of transfusion of platelets   . Hypertension   . ITP (idiopathic thrombocytopenic purpura)    Dr. Grayland Ormond follows  . Kidney stones   . Lewy body dementia with behavioral disturbance   . Major depressive disorder, recurrent episode, in partial remission with anxious distress ([place])   . Pancreatitis 2/11  . Pollen allergies   . Skin cancer    ears  . Sleep apnea    "won't wear mask anymore" (12/22/2012)  . Unspecified essential hypertension     Patient Active Problem List   Diagnosis Date Noted  . Malignant neoplasm of colon ([place]) 11/11/2015  . Thrombocytopenia ([place]) 10/04/2015  . Uncontrolled REM sleep behavior disorder 11/16/2014  . Lewy body dementia with behavioral disturbance 11/16/2014  . Tremor of right hand 11/16/2014  . Shuffling gait 11/16/2014  . Major depressive disorder, recurrent episode, in partial remission with anxious distress ([place])   . REM sleep behavior disorder 12/07/2013  . Incisional hernia, without obstruction or gangrene 11/14/2012  . Colon cancer ([place]) 03/19/2012  . 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 GERD (gastroesophageal reflux disease) 03/19/2012  . Pollen allergies   . Kidney stones   . Sleep apnea   . Unspecified essential hypertension     Past Surgical History:  Procedure Laterality Date  . CARDIAC CATHETERIZATION  2000's   "tx'd w/acid reflux RX" (12/22/2012)  . CHOLECYSTECTOMY  2011  . ESOPHAGEAL DILATION  1990's?  Marland Kitchen HERNIA REPAIR  12/22/2012   IHR w/mesh  . INGUINAL HERNIA REPAIR Left [id number]  . INSERTION OF MESH N/A 12/22/2012   Procedure: INSERTION OF MESH;  Surgeon: Harl Bowie, MD;  Location: [facility];  Service: General;  Laterality: N/A;  . LITHOTRIPSY  2008   x2  . PARTIAL COLECTOMY  03/21/2012   Procedure: PARTIAL COLECTOMY;  Surgeon: Harl Bowie, MD;  Location: [facility];  Service: General;  Laterality: Right;  . PILONIDAL CYST EXCISION  1960's?  Marland Kitchen SKIN CANCER EXCISION     "face; ?left ear" (12/22/2012)  . TONSILLECTOMY AND ADENOIDECTOMY  1946  . TOTAL HIP ARTHROPLASTY Left ~ 2002  . UPPER GASTROINTESTINAL ENDOSCOPY    . VENTRAL HERNIA REPAIR N/A 12/22/2012   Procedure: HERNIA REPAIR INCISIONAL ;  Surgeon: Harl Bowie, MD;  Location: [facility];  Service: General;  Laterality: N/A;    Prior to Admission medications   Medication Sig Start Date End Date Taking? Authorizing Provider  clonazePAM (KLONOPIN) 0.5 MG tablet take 1/2 tablet by mouth at bedtime 11/14/15   Larey Seat, MD  donepezil (ARICEPT) 10 MG tablet take 1 tablet  by mouth once daily 11/13/15   Ward Givens, NP  hydrOXYzine (ATARAX/VISTARIL) 10 MG tablet Take 10 mg Hydroxacen at night to suppress the skin scratching 03/04/16   Larey Seat, MD  memantine [facility] TITRATION PAK) tablet pack Take by mouth See admin instructions. 5 mg/day for =1 week; 5 mg twice daily for =1 week; 15 mg/day given in 5 mg and 10 mg separated doses for =1 week; then 10 mg twice daily 03/04/16   Larey Seat, MD  omeprazole (PRILOSEC) 20 MG capsule Take 20 mg by mouth daily.     Historical Provider, MD  triamterene-hydrochlorothiazide (MAXZIDE-25) 37.5-25 MG tablet Take 0.5 tablets by mouth daily. 11/14/15   Owens Loffler, MD    Allergies  Allergen Reactions  . Aspirin   . Chicken Allergy     unknown    Family History  Problem Relation Age of Onset  . Colon cancer Mother   . Heart disease Mother   . Hypertension Mother   . Alcohol abuse Father   . Esophageal cancer Neg Hx   . Rectal cancer Neg Hx   . Stomach cancer Neg Hx     Social History Social History  Substance Use Topics  . Smoking status: Former Smoker    Types: Pipe  . Smokeless tobacco: Never Used     Comment: i4/06/2013 "only smoked pipe n college 1958"  . Alcohol use No    Review of Systems L5 caveat, but these are patient's answers Constitutional: Negative for fever. Eyes: Negative for visual changes. ENT: Negative for sore throat. Cardiovascular: Negative for chest pain. Respiratory: Negative for shortness of breath. Gastrointestinal: Negative for vomiting and diarrhea. Genitourinary: Negative for dysuria. Musculoskeletal: Negative for back pain. Skin: Negative for rash. Neurological: Negative for headache. 10 point Review of Systems otherwise negative ____________________________________________   PHYSICAL EXAM:  VITAL SIGNS: ED Triage Vitals  Enc Vitals Group     BP      Pulse      Resp      Temp      Temp src      SpO2      Weight      Height      Head Circumference      Peak Flow      Pain Score      Pain Loc      Pain Edu?      Excl. in [place]?      Constitutional: Alert and cooperative, but disoriented. Well appearing and in no distress, though occasionally winces and states he is having rectal pain. HEENT   Head: Normocephalic and atraumatic.      Eyes: Conjunctivae are normal. PERRL. Normal extraocular movements.      Ears:         Nose: No congestion/rhinnorhea.   Mouth/Throat: Mucous membranes are mildly dry.   Neck: No stridor.   Cardiovascular/Chest: Normal rate, regular rhythm.  No murmurs, rubs, or gallops. Respiratory: Normal respiratory effort without tachypnea nor retractions. Breath sounds are clear and equal bilaterally. No wheezes/rales/rhonchi. Gastrointestinal: Soft. No distention, no guarding, no rebound. Nontender.    Genitourinary/rectal:  Relatively soft rectal stool ball in the rectal vault. Brown stool, but is Hemoccult positive. Musculoskeletal: Nontender with normal range of motion in all extremities. No joint effusions.  No lower extremity tenderness.  No edema. Neurologic:  Poor historian, but alert and cooperative.  No facial droop.  No gross or focal neurologic deficits are appreciated. Skin:  Skin is warm, dry and intact.  No rash noted. Psychiatric: No agitation.  ____________________________________________   EKG I, Lisa Roca, MD, the attending physician have personally viewed and interpreted all ECGs.  70 bpm. Normal sinus rhythm. Narrow QRS.  LVH. Left axis deviation. Nonspecific ST and T-wave ____________________________________________  LABS (pertinent positives/negatives)  Labs Reviewed  CBC WITH DIFFERENTIAL/PLATELET - Abnormal; Notable for the following:       Result Value   WBC 3.3 (*)    RBC 3.66 (*)    HCT 37.7 (*)    MCV 103.0 (*)    MCH 36.1 (*)    Platelets 102 (*)    Lymphs Abs 0.7 (*)    All other components within normal limits  COMPREHENSIVE METABOLIC PANEL - Abnormal; Notable for the following:    Glucose, Bld 104 (*)    BUN 22 (*)    Calcium 8.8 (*)    All other components within normal limits  LIPASE, BLOOD  TROPONIN I  PROTIME-INR  URINALYSIS COMPLETEWITH MICROSCOPIC ([facility] ONLY)    ____________________________________________  RADIOLOGY All Xrays were viewed by me. Imaging interpreted by Radiologist.  CT abdomen and pelvis with contrast:  IMPRESSION: 1. Nonspecific circumferential wall thickening and mucosal hyperenhancement throughout the  rectum with associated faint perirectal fat haziness, most suggestive of an infectious or inflammatory proctitis, although a rectal neoplasm can have a similar appearance. Clinical correlation is necessary. Consider endoscopic evaluation as clinically warranted. 2. Interval enlargement of large midline ventral abdominal wall hernia containing small bowel loops. No findings to suggest small bowel obstruction or ischemia. 3. Interval growth of 2.3 cm cystic pancreatic body mass. Stable mild dilatation of the ventral pancreatic duct. MRI of the abdomen without and with IV contrast is recommended on a short term outpatient basis for further characterization, as an enlarging cystic pancreatic neoplasm cannot be excluded. 4. Additional findings include aortic atherosclerosis, coronary atherosclerosis, small to moderate hiatal hernia and moderate prostatomegaly.  __________________________________________  PROCEDURES  Procedure(s) performed: None  Critical Care performed: None  ____________________________________________   ED COURSE / ASSESSMENT AND PLAN  Pertinent labs & imaging results that were available during my care of the patient were reviewed by me and considered in my medical decision making (see chart for details).   This patient states she's of her rectal pain, and he does have a fecal impaction, although it seems relatively soft. I did do some manual disimpaction as well as instillation, he will be given a soapsuds enema here.  Per EMS, the report from the wife was that the patient has had some rectal bleeding. He does not have any evidence of irritated hemorrhoids, nor streaking on rectal exam, but the brown stool is heme positive.  For further soapsuds enema, the patient did have a large bowel movement on his own.  Additional family members showed up including the wife and children, and it turns out he's been having mid abdominal pain as well as the severe rectal  spasms.  He is complaining of abdominal cramping, and I spoke with the family about obtaining a CT for further investigation given the heme positive stool and history of colon cancer in the past.  CT scan of abdomen shows enlarging cyst like mass on the pancreas, recommending some additional MRI imaging although not necessarily emergent here in the ED. Additionally, there is inflammation of the rectum consistent with proctitis versus mass, which is somewhat concerning given his history of cancer.  I spoke with the on-call gastroenterologist, who felt like the findings of inflammation in the rectum  could be consistent with this recent fecal impaction that is now resolved.  However, given the patient's ongoing abdominal discomfort, heme-positive stool, and multiple CT findings including the pancreatic cyst, and rectal inflammation, I will speak with the hospitalist for admission and observation overnight.      CONSULTATIONS: GI on call Dr. Epimenio Foot, will see in the hospital.  Hospitalist for admission.   Patient / Family / Caregiver informed of clinical course, medical decision-making process, and agree with plan.    ___________________________________________   FINAL CLINICAL IMPRESSION(S) / ED DIAGNOSES   Final diagnoses:  Fecal impaction in rectum (HCC)  Generalized abdominal pain  Heme positive stool  Pancreatic cyst              Note: This dictation was prepared with Dragon dictation. Any transcriptional errors that result from this process are unintentional    Lisa Roca, MD 04/24/16 1918

## 2016-04-25 ENCOUNTER — Other Ambulatory Visit: Payer: Self-pay | Admitting: Hematology and Oncology

## 2016-04-25 DIAGNOSIS — Z8719 Personal history of other diseases of the digestive system: Secondary | ICD-10-CM

## 2016-04-25 DIAGNOSIS — D472 Monoclonal gammopathy: Secondary | ICD-10-CM

## 2016-04-25 DIAGNOSIS — Z87442 Personal history of urinary calculi: Secondary | ICD-10-CM

## 2016-04-25 DIAGNOSIS — K921 Melena: Secondary | ICD-10-CM

## 2016-04-25 DIAGNOSIS — Z86010 Personal history of colonic polyps: Secondary | ICD-10-CM

## 2016-04-25 DIAGNOSIS — D693 Immune thrombocytopenic purpura: Secondary | ICD-10-CM

## 2016-04-25 DIAGNOSIS — G3183 Dementia with Lewy bodies: Secondary | ICD-10-CM

## 2016-04-25 DIAGNOSIS — Z800 Family history of malignant neoplasm of digestive organs: Secondary | ICD-10-CM

## 2016-04-25 DIAGNOSIS — K869 Disease of pancreas, unspecified: Secondary | ICD-10-CM | POA: Diagnosis not present

## 2016-04-25 DIAGNOSIS — K219 Gastro-esophageal reflux disease without esophagitis: Secondary | ICD-10-CM

## 2016-04-25 DIAGNOSIS — F0281 Dementia in other diseases classified elsewhere with behavioral disturbance: Secondary | ICD-10-CM

## 2016-04-25 DIAGNOSIS — F3341 Major depressive disorder, recurrent, in partial remission: Secondary | ICD-10-CM

## 2016-04-25 DIAGNOSIS — Z85828 Personal history of other malignant neoplasm of skin: Secondary | ICD-10-CM

## 2016-04-25 DIAGNOSIS — G4730 Sleep apnea, unspecified: Secondary | ICD-10-CM

## 2016-04-25 DIAGNOSIS — C189 Malignant neoplasm of colon, unspecified: Secondary | ICD-10-CM

## 2016-04-25 DIAGNOSIS — I10 Essential (primary) hypertension: Secondary | ICD-10-CM

## 2016-04-25 DIAGNOSIS — K6289 Other specified diseases of anus and rectum: Secondary | ICD-10-CM

## 2016-04-25 LAB — VITAMIN B12: Vitamin B-12: 200 pg/mL (ref 180–914)

## 2016-04-25 LAB — CBC
HEMATOCRIT: 31.9 % — AB (ref 40.0–52.0)
HEMOGLOBIN: 11.6 g/dL — AB (ref 13.0–18.0)
MCH: 37 pg — AB (ref 26.0–34.0)
MCHC: 36.5 g/dL — ABNORMAL HIGH (ref 32.0–36.0)
MCV: 101.4 fL — AB (ref 80.0–100.0)
PLATELETS: 82 10*3/uL — AB (ref 150–440)
RBC: 3.14 MIL/uL — AB (ref 4.40–5.90)
RDW: 13.5 % (ref 11.5–14.5)
WBC: 3.8 10*3/uL (ref 3.8–10.6)

## 2016-04-25 LAB — BASIC METABOLIC PANEL
ANION GAP: 7 (ref 5–15)
BUN: 19 mg/dL (ref 6–20)
CALCIUM: 8.1 mg/dL — AB (ref 8.9–10.3)
CHLORIDE: 107 mmol/L (ref 101–111)
CO2: 26 mmol/L (ref 22–32)
Creatinine, Ser: 0.9 mg/dL (ref 0.61–1.24)
GFR calc non Af Amer: >60 mL/min (ref >60)
GLUCOSE: 103 mg/dL — AB (ref 65–99)
POTASSIUM: 3.1 mmol/L — AB (ref 3.5–5.1)
Sodium: 140 mmol/L (ref 135–145)

## 2016-04-25 LAB — FOLATE: Folate: 13.1 ng/mL (ref >5.9)

## 2016-04-25 LAB — FERRITIN: FERRITIN: 112 ng/mL (ref 24–336)

## 2016-04-25 LAB — IRON AND TIBC
Iron: 30 ug/dL — ABNORMAL LOW (ref 45–182)
SATURATION RATIOS: 14 % — AB (ref 17.9–39.5)
TIBC: 209 ug/dL — AB (ref 250–450)
UIBC: 179 ug/dL

## 2016-04-25 MED ORDER — POLYETHYLENE GLYCOL 3350 17 G PO PACK: 17.0000 g | PACK | Freq: Every day | ORAL | 0 refills | Status: DC

## 2016-04-25 MED ORDER — POTASSIUM CHLORIDE 20 MEQ/15ML (10%) PO SOLN
40.0000 meq | Freq: Once | ORAL | Status: AC
  Administered 2016-04-25: 14:00:00 40 meq via ORAL
  Filled 2016-04-25: qty 30

## 2016-04-25 NOTE — Discharge Instructions (Signed)
Abdominal Pain, Adult °Many things can cause abdominal pain. Usually, abdominal pain is not caused by a disease and will improve without treatment. It can often be observed and treated at home. Your health care provider will do a physical exam and possibly order blood tests and X-rays to help determine the seriousness of your pain. However, in many cases, more time must pass before a clear cause of the pain can be found. Before that point, your health care provider may not know if you need more testing or further treatment. °HOME CARE INSTRUCTIONS °Monitor your abdominal pain for any changes. The following actions may help to alleviate any discomfort you are experiencing: °· Only take over-the-counter or prescription medicines as directed by your health care provider. °· Do not take laxatives unless directed to do so by your health care provider. °· Try a clear liquid diet (broth, tea, or water) as directed by your health care provider. Slowly move to a bland diet as tolerated. °SEEK MEDICAL CARE IF: °· You have unexplained abdominal pain. °· You have abdominal pain associated with nausea or diarrhea. °· You have pain when you urinate or have a bowel movement. °· You experience abdominal pain that wakes you in the night. °· You have abdominal pain that is worsened or improved by eating food. °· You have abdominal pain that is worsened with eating fatty foods. °· You have a fever. °SEEK IMMEDIATE MEDICAL CARE IF: °· Your pain does not go away within 2 hours. °· You keep throwing up (vomiting). °· Your pain is felt only in portions of the abdomen, such as the right side or the left lower portion of the abdomen. °· You pass bloody or black tarry stools. °MAKE SURE YOU: °· Understand these instructions. °· Will watch your condition. °· Will get help right away if you are not doing well or get worse. °  °This information is not intended to replace advice given to you by your health care provider. Make sure you discuss  any questions you have with your health care provider. °  °Document Released: 06/10/2005 Document Revised: 05/22/2015 Document Reviewed: 05/10/2013 °Elsevier Interactive Patient Education ©2016 [employer] ° °Constipation, Adult °Constipation is when a person has fewer than three bowel movements a week, has difficulty having a bowel movement, or has stools that are dry, hard, or larger than normal. As people grow older, constipation is more common. A low-fiber diet, not taking in enough fluids, and taking certain medicines may make constipation worse.  °CAUSES  °· Certain medicines, such as antidepressants, pain medicine, iron supplements, antacids, and water pills.   °· Certain diseases, such as diabetes, irritable bowel syndrome (IBS), thyroid disease, or depression.   °· Not drinking enough water.   °· Not eating enough fiber-rich foods.   °· Stress or travel.   °· Lack of physical activity or exercise.   °· Ignoring the urge to have a bowel movement.   °· Using laxatives too much.   °SIGNS AND SYMPTOMS  °· Having fewer than three bowel movements a week.   °· Straining to have a bowel movement.   °· Having stools that are hard, dry, or larger than normal.   °· Feeling full or bloated.   °· Pain in the lower abdomen.   °· Not feeling relief after having a bowel movement.   °DIAGNOSIS  °Your health care provider will take a medical history and perform a physical exam. Further testing may be done for severe constipation. Some tests may include: °· A barium enema X-ray to examine your rectum, colon, and, sometimes,   your small intestine.   °· A sigmoidoscopy to examine your lower colon.   °· A colonoscopy to examine your entire colon. °TREATMENT  °Treatment will depend on the severity of your constipation and what is causing it. Some dietary treatments include drinking more fluids and eating more fiber-rich foods. Lifestyle treatments may include regular exercise. If these diet and lifestyle recommendations do not  help, your health care provider may recommend taking over-the-counter laxative medicines to help you have bowel movements. Prescription medicines may be prescribed if over-the-counter medicines do not work.  °HOME CARE INSTRUCTIONS  °· Eat foods that have a lot of fiber, such as fruits, vegetables, whole grains, and beans. °· Limit foods high in fat and processed sugars, such as french fries, hamburgers, cookies, candies, and soda.   °· A fiber supplement may be added to your diet if you cannot get enough fiber from foods.   °· Drink enough fluids to keep your urine clear or pale yellow.   °· Exercise regularly or as directed by your health care provider.   °· Go to the restroom when you have the urge to go. Do not hold it.   °· Only take over-the-counter or prescription medicines as directed by your health care provider. Do not take other medicines for constipation without talking to your health care provider first.   °SEEK IMMEDIATE MEDICAL CARE IF:  °· You have bright red blood in your stool.   °· Your constipation lasts for more than 4 days or gets worse.   °· You have abdominal or rectal pain.   °· You have thin, pencil-like stools.   °· You have unexplained weight loss. °MAKE SURE YOU:  °· Understand these instructions. °· Will watch your condition. °· Will get help right away if you are not doing well or get worse. °  °This information is not intended to replace advice given to you by your health care provider. Make sure you discuss any questions you have with your health care provider. °  °Document Released: 05/29/2004 Document Revised: 09/21/2014 Document Reviewed: 06/12/2013 °Elsevier Interactive Patient Education ©2016 [employer] ° °

## 2016-04-25 NOTE — Consult Note (Signed)
[facility]  Date of admission:  04/24/2016  Inpatient day:  04/25/16  Consulting physician:  Dr. Lance Coon   Reason for Consultation:  Pancreatic mass.  Chief Complaint: John Summers is a 82 y.o. male with a history stage IIA colon cancer and a monoclonal gammopathy of unknown significance (MGUS) who was admitted through the emergency room with rectal bleeding.  HPI: The patient has a history of a pancreatic mass.  Abdomen and pelvic CT scan on 09/30/2015 revealed a 1.6 cm cystic lesion in the proximal body of the pancreas minimally increased since the prior study (1.3 cm in 03/19/2012).  He was last seen in the oncology clinic on 11/11/2015.  Hematocrit was 37.3 with a platelet count of 86,000.  CEA was 1.7.  He was scheduled for an appointment with GI on 12/10/2015 for evalaution of his GI mass.  He is followed by Dr. Ardis Hughs, gastroenterologist, in [place].  Last colonoscopy was in 2013.  No further colonoscopies were recommended secondary to his co-morbidities.  He presented to the emergency room with severe abdominal pain and constipation.  After disimpaction, he had rectal bleeding.  CBC on 04/24/2016 revealed a hematocrit 37.7, hemoglobin 13.2, and platelets 102,000.  CBC today reveals a hematocrit 31.9 and platelets 82,000.  He has had no further rectal bleeding.  Additional labs today reveal a normal ferritin (112), iron saturation of 14% (low) and TIBC 209 (low).  Folate is normal.  B12 is pending.  Abdomen and pelvic CT scan on 04/24/2016 revealed nonspecific circumferential wall thickening and mucosal hyperenhancement throughout the rectum with associated faint perirectal fat haziness, most suggestive of an infectious or inflammatory proctitis.  There was interval enlargement of large midline ventral abdominal wall hernia containing small bowel loops.   There was interval growth of 2.3 cm cystic pancreatic body mass with stable mild dilatation of the  ventral pancreatic duct.   Symptomatically, he is feeling much better since his disimpaction.  He is being scheduled for discharge today.  He has Lewy body dementia.    He spends the majority of his day resting and sitting in a chair.  He has a hard time getting up and changing position.   Past Medical History:  Diagnosis Date  . Colon adenocarcinoma ([place])   . Complication of anesthesia    severe confusion  . GERD (gastroesophageal reflux disease)   . History of colon polyps   . History of gallstones   . Hx of transfusion of platelets   . Hypertension   . ITP (idiopathic thrombocytopenic purpura)    Dr. Grayland Ormond follows  . Kidney stones   . Lewy body dementia with behavioral disturbance   . Major depressive disorder, recurrent episode, in partial remission with anxious distress ([place])   . Pancreatitis 2/11  . Pollen allergies   . Skin cancer    ears  . Sleep apnea    "won't wear mask anymore" (12/22/2012)  . Unspecified essential hypertension     Past Surgical History:  Procedure Laterality Date  . CARDIAC CATHETERIZATION  2000's   "tx'd w/acid reflux RX" (12/22/2012)  . CHOLECYSTECTOMY  2011  . ESOPHAGEAL DILATION  1990's?  Marland Kitchen HERNIA REPAIR  12/22/2012   IHR w/mesh  . INGUINAL HERNIA REPAIR Left [id number]  . INSERTION OF MESH N/A 12/22/2012   Procedure: INSERTION OF MESH;  Surgeon: Harl Bowie, MD;  Location: [facility];  Service: General;  Laterality: N/A;  . LITHOTRIPSY  2008   x2  . PARTIAL  COLECTOMY  03/21/2012   Procedure: PARTIAL COLECTOMY;  Surgeon: Harl Bowie, MD;  Location: [place];  Service: General;  Laterality: Right;  . PILONIDAL CYST EXCISION  1960's?  Marland Kitchen SKIN CANCER EXCISION     "face; ?left ear" (12/22/2012)  . TONSILLECTOMY AND ADENOIDECTOMY  1946  . TOTAL HIP ARTHROPLASTY Left ~ 2002  . UPPER GASTROINTESTINAL ENDOSCOPY    . VENTRAL HERNIA REPAIR N/A 12/22/2012   Procedure: HERNIA REPAIR INCISIONAL ;  Surgeon: Harl Bowie, MD;  Location: [facility];  Service: General;  Laterality: N/A;    Family History  Problem Relation Age of Onset  . Colon cancer Mother   . Heart disease Mother   . Hypertension Mother   . Alcohol abuse Father   . Esophageal cancer Neg Hx   . Rectal cancer Neg Hx   . Stomach cancer Neg Hx     Social History:  reports that he has quit smoking. His smoking use included Pipe. He has never used smokeless tobacco. He reports that he does not drink alcohol or use drugs.  The patient is accompanied by his wife and step son.  Allergies:  Allergies  Allergen Reactions  . Aspirin   . Chicken Allergy     unknown    Medications Prior to Admission  Medication Sig Dispense Refill  . clonazePAM (KLONOPIN) 0.5 MG tablet take 1/2 tablet by mouth at bedtime 45 tablet 1  . donepezil (ARICEPT) 10 MG tablet take 1 tablet by mouth once daily 30 tablet 5  . hydrOXYzine (ATARAX/VISTARIL) 10 MG tablet Take 10 mg Hydroxacen at night to suppress the skin scratching 90 tablet 0  . triamterene-hydrochlorothiazide (MAXZIDE-25) 37.5-25 MG tablet Take 0.5 tablets by mouth daily. (Patient taking differently: Take 0.5 tablets by mouth at bedtime. ) 45 tablet 1    Review of Systems: GENERAL:  Tired.  No fevers, sweats or weight loss. PERFORMANCE STATUS (ECOG):  3 HEENT:  Poor hearing.  No visual changes, runny nose, sore throat, mouth sores or tenderness. Lungs: No shortness of breath or cough.  No hemoptysis. Cardiac:  No chest pain, palpitations, orthopnea, or PND. GI:  Constipation.  No nausea, vomiting, diarrhea, constipation, melena or hematochezia. GU:  No urgency, frequency, dysuria, or hematuria. Musculoskeletal:  No back pain.  No joint pain.  No muscle tenderness. Extremities:  No pain or swelling. Skin:  No rashes or skin changes. Neuro:  Stumbles a lot.  Poor memory.  No headache, focal numbness or weakness. Endocrine:  No diabetes, thyroid issues, hot flashes or night sweats. Psych:  No mood changes, depression or  anxiety. Pain:  Abdominal discomfort improved.. Review of systems:  All other systems reviewed and found to be negative.  Physical Exam:  Blood pressure 111/63, pulse (!) 54, temperature 97.8 F (36.6 C), temperature source Oral, resp. rate 20, height _0  (1.778 m), weight 214 lb 5 oz (97.2 kg), SpO2 98 %.  GENERAL:  Well developed, well nourished, elderly gentleman sitting comfortably on the medical unit in no acute distress. MENTAL STATUS:  Alert and oriented to person and place. HEAD:  Pearline Cables hair and beard.  Normocephalic, atraumatic, face symmetric, no Cushingoid features. EYES:  Blue eyes.  Pupils equal round and reactive to light and accomodation.  No conjunctivitis or scleral icterus. ENT:  Oropharynx clear without lesion.  Tongue normal. Mucous membranes moist.  RESPIRATORY:  Clear to auscultation anteriorly without rales, wheezes or rhonchi. CARDIOVASCULAR:  Regular rate and rhythm without murmur, rub or gallop.  ABDOMEN:  Soft, slightly tender in the lower quadrants without guarding or rebound tenderness.  Active bowel sounds and no hepatosplenomegaly.  No masses. SKIN:  No rashes, ulcers or lesions. EXTREMITIES: ICDs in place.  No edema, no skin discoloration or tenderness.  No palpable cords. LYMPH NODES: No palpable cervical, supraclavicular, axillary or inguinal adenopathy  NEUROLOGICAL: Unclear about details of recent events (baseline).  Poor memory.Marland Kitchen PSYCH:  Appropriate.   Results for orders placed or performed during the hospital encounter of 04/24/16 (from the past 48 hour(s))  CBC with Differential     Status: Abnormal   Collection Time: 04/24/16  4:17 PM  Result Value Ref Range   WBC 3.3 (L) 3.8 - 10.6 K/uL   RBC 3.66 (L) 4.40 - 5.90 MIL/uL   Hemoglobin 13.2 13.0 - 18.0 g/dL   HCT 37.7 (L) 40.0 - 52.0 %   MCV 103.0 (H) 80.0 - 100.0 fL   MCH 36.1 (H) 26.0 - 34.0 pg   MCHC 35.1 32.0 - 36.0 g/dL   RDW 13.6 11.5 - 14.5 %   Platelets 102 (L) 150 - 440 K/uL    Neutrophils Relative % 66 %   Neutro Abs 2.2 1.4 - 6.5 K/uL   Lymphocytes Relative 22 %   Lymphs Abs 0.7 (L) 1.0 - 3.6 K/uL   Monocytes Relative 12 %   Monocytes Absolute 0.4 0.2 - 1.0 K/uL   Eosinophils Relative 0 %   Eosinophils Absolute 0.0 0 - 0.7 K/uL   Basophils Relative 0 %   Basophils Absolute 0.0 0 - 0.1 K/uL  Lipase, blood     Status: None   Collection Time: 04/24/16  4:17 PM  Result Value Ref Range   Lipase 30 11 - 51 U/L  Comprehensive metabolic panel     Status: Abnormal   Collection Time: 04/24/16  4:17 PM  Result Value Ref Range   Sodium 141 135 - 145 mmol/L   Potassium 3.6 3.5 - 5.1 mmol/L   Chloride 108 101 - 111 mmol/L   CO2 26 22 - 32 mmol/L   Glucose, Bld 104 (H) 65 - 99 mg/dL   BUN 22 (H) 6 - 20 mg/dL   Creatinine, Ser 0.89 0.61 - 1.24 mg/dL   Calcium 8.8 (L) 8.9 - 10.3 mg/dL   Total Protein 7.2 6.5 - 8.1 g/dL   Albumin 3.8 3.5 - 5.0 g/dL   AST 19 15 - 41 U/L   ALT 18 17 - 63 U/L   Alkaline Phosphatase 50 38 - 126 U/L   Total Bilirubin 0.6 0.3 - 1.2 mg/dL   GFR calc non Af Amer >60 >60 mL/min   GFR calc Af Amer >60 >60 mL/min    Comment: (NOTE) The eGFR has been calculated using the CKD EPI equation. This calculation has not been validated in all clinical situations. eGFR's persistently <60 mL/min signify possible Chronic Kidney Disease.    Anion gap 7 5 - 15  Troponin I     Status: None   Collection Time: 04/24/16  4:17 PM  Result Value Ref Range   Troponin I <0.03 <0.03 ng/mL  Protime-INR     Status: None   Collection Time: 04/24/16  4:17 PM  Result Value Ref Range   Prothrombin Time 14.3 11.4 - 15.2 seconds   INR 1.11   Urinalysis complete, with microscopic     Status: Abnormal   Collection Time: 04/24/16  6:54 PM  Result Value Ref Range   Color, Urine YELLOW (A) YELLOW  APPearance CLEAR (A) CLEAR   Glucose, UA NEGATIVE NEGATIVE mg/dL   Bilirubin Urine NEGATIVE NEGATIVE   Ketones, ur NEGATIVE NEGATIVE mg/dL   Specific Gravity, Urine  1.027 1.005 - 1.030   Hgb urine dipstick NEGATIVE NEGATIVE   pH 5.0 5.0 - 8.0   Protein, ur NEGATIVE NEGATIVE mg/dL   Nitrite NEGATIVE NEGATIVE   Leukocytes, UA NEGATIVE NEGATIVE   RBC / HPF 0-5 0 - 5 RBC/hpf   WBC, UA 0-5 0 - 5 WBC/hpf   Bacteria, UA NONE SEEN NONE SEEN   Squamous Epithelial / LPF 0-5 (A) NONE SEEN  Basic metabolic panel     Status: Abnormal   Collection Time: 04/25/16  4:26 AM  Result Value Ref Range   Sodium 140 135 - 145 mmol/L   Potassium 3.1 (L) 3.5 - 5.1 mmol/L   Chloride 107 101 - 111 mmol/L   CO2 26 22 - 32 mmol/L   Glucose, Bld 103 (H) 65 - 99 mg/dL   BUN 19 6 - 20 mg/dL   Creatinine, Ser 0.90 0.61 - 1.24 mg/dL   Calcium 8.1 (L) 8.9 - 10.3 mg/dL   GFR calc non Af Amer >60 >60 mL/min   GFR calc Af Amer >60 >60 mL/min    Comment: (NOTE) The eGFR has been calculated using the CKD EPI equation. This calculation has not been validated in all clinical situations. eGFR's persistently <60 mL/min signify possible Chronic Kidney Disease.    Anion gap 7 5 - 15  CBC     Status: Abnormal   Collection Time: 04/25/16  4:26 AM  Result Value Ref Range   WBC 3.8 3.8 - 10.6 K/uL   RBC 3.14 (L) 4.40 - 5.90 MIL/uL   Hemoglobin 11.6 (L) 13.0 - 18.0 g/dL   HCT 31.9 (L) 40.0 - 52.0 %   MCV 101.4 (H) 80.0 - 100.0 fL   MCH 37.0 (H) 26.0 - 34.0 pg   MCHC 36.5 (H) 32.0 - 36.0 g/dL   RDW 13.5 11.5 - 14.5 %   Platelets 82 (L) 150 - 440 K/uL   Ct Abdomen Pelvis W Contrast  Result Date: 04/24/2016 CLINICAL DATA:  Generalized abdominal pain.  Rectal bleeding. EXAM: CT ABDOMEN AND PELVIS WITH CONTRAST TECHNIQUE: Multidetector CT imaging of the abdomen and pelvis was performed using the standard protocol following bolus administration of intravenous contrast. CONTRAST:  149m ISOVUE-300 IOPAMIDOL (ISOVUE-300) INJECTION 61% COMPARISON:  09/30/2015 CT abdomen/pelvis. FINDINGS: Lower chest: No significant pulmonary nodules or acute consolidative airspace disease. Coronary  atherosclerosis. Hepatobiliary: Normal liver with no liver mass. Cholecystectomy. No intrahepatic biliary ductal dilatation. Common bile duct diameter 9 mm, within expected post cholecystectomy limits. No radiopaque choledocholithiasis. Pancreas: Cystic 2.3 cm pancreatic body mass (series 2/image 18), increased in size from 1.6 cm on 09/30/2015. No additional pancreatic masses. Stable mildly dilated ventral pancreatic duct in the pancreatic head (4 mm diameter). Spleen: Normal size. No mass. Adrenals/Urinary Tract: Normal adrenals. Subcentimeter hypodense renal cortical lesion in the lateral interpolar right kidney, too small to characterize, unchanged, most consistent with a benign renal cyst. Otherwise normal kidneys, with no hydronephrosis. Bladder visualization is limited by streak artifact from the left hip hardware, with no gross bladder abnormality. Stomach/Bowel: Small to moderate hiatal hernia. Otherwise grossly normal stomach. Large ventral abdominal wall midline hernia containing numerous small bowel loops has increased in size. No small bowel dilatation, pneumatosis or wall thickening. Status post subtotal right hemicolectomy with intact appearing ileocolic anastomosis in the right upper abdomen. Mild sigmoid  diverticulosis. There is nonspecific circumferential wall thickening and mucosal hyperenhancement throughout the rectum with associated faint perirectal fat haziness. Vascular/Lymphatic: Atherosclerotic nonaneurysmal abdominal aorta. Patent portal, splenic, hepatic and renal veins. 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 No pathologically enlarged lymph nodes in the abdomen or pelvis. Reproductive: Moderately enlarged prostate, unchanged. Other: No pneumoperitoneum, ascites or focal fluid collection. Musculoskeletal: No aggressive appearing focal osseous lesions. Left total hip arthroplasty. Marked thoracolumbar spondylosis. IMPRESSION: 1. Nonspecific circumferential wall thickening and  mucosal hyperenhancement throughout the rectum with associated faint perirectal fat haziness, most suggestive of an infectious or inflammatory proctitis, although a rectal neoplasm can have a similar appearance. Clinical correlation is necessary. Consider endoscopic evaluation as clinically warranted. 2. Interval enlargement of large midline ventral abdominal wall hernia containing small bowel loops. No findings to suggest small bowel obstruction or ischemia. 3. Interval growth of 2.3 cm cystic pancreatic body mass. Stable mild dilatation of the ventral pancreatic duct. MRI of the abdomen without and with IV contrast is recommended on a short term outpatient basis for further characterization, as an enlarging cystic pancreatic neoplasm cannot be excluded. 4. Additional findings include aortic atherosclerosis, coronary atherosclerosis, small to moderate hiatal hernia and moderate prostatomegaly. Electronically Signed   By: Ilona Sorrel M.D.   On: 04/24/2016 18:38    Assessment:  The patient is a 82 y.o.  gentleman with stage IIA colon cancer and MGUS who was admitted with rectal bleeding post disimpaction.  Imaging studies reveals an enlarging cystic pancreatic body mass.   Patient has a monoclonal gammopathy of unknown significance (MGUS).  SPEP on 11/04/2015 revealed a M spike of 1.4 gm/dL.  Platelet count at that time was 86,000.  Ferritin and folate are normal.  Iron studies suggest chronic disease.  B12 is pending.  Plan:   1.  Discuss with patient and family plan for outpatient abdominal MRI.  Family notes "aversion to anaesthesia" and concern for procedures/biopsies.  They are interested in quality of life.  Discussed plan to follow up with Dr. Ardis Hughs and Dr. Grayland Ormond after discharge. 2.  Labs:  CA19-9. 3.  Follow-up in outpatient department with Dr. Grayland Ormond and Dr Ardis Hughs.  Thank you for allowing me to participate in John Summers 's care.  I will follow him closely with you while  hospitalized.  Dr. Delight Hoh will follow him in the outpatient department.   Lequita Asal, MD  04/25/2016, 8:21 AM

## 2016-04-25 NOTE — Consult Note (Signed)
Consultation  Referring Provider:      Primary Care Physician:  Owens Loffler, MD Primary Gastroenterologist:         Reason for Consultation:      John Summers  is a 82 y.o. male who presents with severe abdominal pain and fecal impaction.  Here he was noted to have bleeding per rectum when he was disimpacted and his stool was hemoccult positive. The ER physician disimpacted the rectum of large stool and patient had immediate relief. CT abdomen and pelvis showed rectal wall thickening most likely consistent with inflammation. CT also showed a pancreatic mass which was known, but has grown in size (1.6 to 2.3 cm) since his prior CT in 09-2015. Stable dilation of ventral pancreatic duct is seen.   Today patient appears in no acute distress and had a normal bowel movement. Last colonoscopy was performed in 2013 by local GI. No further surveillance colonoscopies were recommended based on co-morbidities.    Impression / Plan:   Fecal Impaction: H/x of colon cancer in past. CT shows nonspecific rectal inflammation which is most likely secondary to large fecal impaction.  Wife reports difficulty with past colonoscopies and sedation. Doing well today with no rectal pain and normal bowel movements.  I recommend advancing diet and starting Miralax 1-2 TBSP daily.  Outpatient referral to primary gastroenterologist to consider flex sigmoidoscopy or colonoscopy.   Pancreatic Mass: Possible outpatient MRI and referral to primary gastroenterologist.           HPI:   John Summers is a 82 y.o. male   Past Medical History:  Diagnosis Date  . Colon adenocarcinoma ([place])   . Complication of anesthesia    severe confusion  . GERD (gastroesophageal reflux disease)   . History of colon polyps   . History of gallstones   . Hx of transfusion of platelets   . Hypertension   . ITP (idiopathic thrombocytopenic purpura)    Dr. Grayland Ormond follows  . Kidney stones   . Lewy body dementia with behavioral  disturbance   . Major depressive disorder, recurrent episode, in partial remission with anxious distress ([place])   . Pancreatitis 2/11  . Pollen allergies   . Skin cancer    ears  . Sleep apnea    "won't wear mask anymore" (12/22/2012)  . Unspecified essential hypertension     Past Surgical History:  Procedure Laterality Date  . CARDIAC CATHETERIZATION  2000's   "tx'd w/acid reflux RX" (12/22/2012)  . CHOLECYSTECTOMY  2011  . ESOPHAGEAL DILATION  1990's?  Marland Kitchen HERNIA REPAIR  12/22/2012   IHR w/mesh  . INGUINAL HERNIA REPAIR Left [id number]  . INSERTION OF MESH N/A 12/22/2012   Procedure: INSERTION OF MESH;  Surgeon: Harl Bowie, MD;  Location: [facility];  Service: General;  Laterality: N/A;  . LITHOTRIPSY  2008   x2  . PARTIAL COLECTOMY  03/21/2012   Procedure: PARTIAL COLECTOMY;  Surgeon: Harl Bowie, MD;  Location: [facility];  Service: General;  Laterality: Right;  . PILONIDAL CYST EXCISION  1960's?  Marland Kitchen SKIN CANCER EXCISION     "face; ?left ear" (12/22/2012)  . TONSILLECTOMY AND ADENOIDECTOMY  1946  . TOTAL HIP ARTHROPLASTY Left ~ 2002  . UPPER GASTROINTESTINAL ENDOSCOPY    . VENTRAL HERNIA REPAIR N/A 12/22/2012   Procedure: HERNIA REPAIR INCISIONAL ;  Surgeon: Harl Bowie, MD;  Location: [facility];  Service: General;  Laterality: N/A;    Family History  Problem Relation Age of Onset  .  Colon cancer Mother   . Heart disease Mother   . Hypertension Mother   . Alcohol abuse Father   . Esophageal cancer Neg Hx   . Rectal cancer Neg Hx   . Stomach cancer Neg Hx       Social History  Substance Use Topics  . Smoking status: Former Smoker    Types: Pipe  . Smokeless tobacco: Never Used     Comment: i4/06/2013 "only smoked pipe n college 1958"  . Alcohol use No    Prior to Admission medications   Medication Sig Start Date End Date Taking? Authorizing Provider  clonazePAM (KLONOPIN) 0.5 MG tablet take 1/2 tablet by mouth at bedtime 11/14/15  Yes Larey Seat, MD   donepezil (ARICEPT) 10 MG tablet take 1 tablet by mouth once daily 11/13/15  Yes Ward Givens, NP  hydrOXYzine (ATARAX/VISTARIL) 10 MG tablet Take 10 mg Hydroxacen at night to suppress the skin scratching 03/04/16  Yes Larey Seat, MD  triamterene-hydrochlorothiazide (MAXZIDE-25) 37.5-25 MG tablet Take 0.5 tablets by mouth daily. Patient taking differently: Take 0.5 tablets by mouth at bedtime.  11/14/15  Yes Owens Loffler, MD    Current Facility-Administered Medications  Medication Dose Route Frequency Provider Last Rate Last Dose  . acetaminophen (TYLENOL) tablet 650 mg  650 mg Oral Q6H PRN Lance Coon, MD   650 mg at 04/24/16 2317   Or  . acetaminophen (TYLENOL) suppository 650 mg  650 mg Rectal Q6H PRN Lance Coon, MD      . clonazePAM [facility]) tablet 0.25 mg  0.25 mg Oral QHS Lance Coon, MD   0.25 mg at 04/24/16 2317  . donepezil (ARICEPT) tablet 10 mg  10 mg Oral Daily Lance Coon, MD   10 mg at 04/25/16 [id number]  . hydrOXYzine (ATARAX/VISTARIL) tablet 10 mg  10 mg Oral QHS Lance Coon, MD   10 mg at 04/24/16 2317  . ondansetron (ZOFRAN) tablet 4 mg  4 mg Oral Q6H PRN Lance Coon, MD       Or  . ondansetron [facility]) injection 4 mg  4 mg Intravenous Q6H PRN Lance Coon, MD      . oxyCODONE (Oxy IR/ROXICODONE) immediate release tablet 5 mg  5 mg Oral Q4H PRN Lance Coon, MD      . sodium chloride flush (NS) 0.9 % injection 3 mL  3 mL Intravenous Q12H Lance Coon, MD   3 mL at 04/25/16 0807    Allergies as of 04/24/2016 - Review Complete 04/24/2016  Allergen Reaction Noted  . Aspirin  02/23/2013  . Chicken allergy  02/20/2012     Review of Systems:    This is positive for those things mentioned in the HPI, . All other review of systems are negative.       Physical Exam:  Vital signs in last 24 hours: Temp:  [97.8 F (36.6 C)-98 F (36.7 C)] 97.8 F (36.6 C) (08/12 0519) Pulse Rate:  [54-81] 54 (08/12 0519) Resp:  [13-20] 20 (08/12 0519) BP: (99-148)/(57-76)  111/63 (08/12 0519) SpO2:  [94 %-100 %] 98 % (08/12 0519) Weight:  [97.2 kg (214 lb 5 oz)-99.2 kg (218 lb 11.2 oz)] 97.2 kg (214 lb 5 oz) (08/11 2319) Last BM Date: 04/24/16  General:  Well-developed, well-nourished and in no acute distress Eyes:  anicteric. ENT:   Mouth and posterior pharynx free of lesions.  Neck:   supple w/o thyromegaly or mass.  Lungs: Clear to auscultation bilaterally. Heart:  S1S2, no rubs, murmurs, gallops. Abdomen:  soft, non-tender, no hepatosplenomegaly, hernia, or mass and BS+. Large ventral hernia Rectal: Lymph:  no cervical or supraclavicular adenopathy. Extremities:   no edema Skin   no rash. Neuro:  A&O x 3.  Psych:  appropriate mood and  Affect.   Data Reviewed:   LAB RESULTS:  Recent Labs  04/24/16 1617 04/25/16 0426  WBC 3.3* 3.8  HGB 13.2 11.6*  HCT 37.7* 31.9*  PLT 102* 82*   BMET  Recent Labs  04/24/16 1617 04/25/16 0426  NA 141 140  K 3.6 3.1*  CL 108 107  CO2 26 26  GLUCOSE 104* 103*  BUN 22* 19  CREATININE 0.89 0.90  CALCIUM 8.8* 8.1*   LFT  Recent Labs  04/24/16 1617  PROT 7.2  ALBUMIN 3.8  AST 19  ALT 18  ALKPHOS 50  BILITOT 0.6   PT/INR  Recent Labs  04/24/16 1617  LABPROT 14.3  INR 1.11    STUDIES: Ct Abdomen Pelvis W Contrast  Result Date: 04/24/2016 CLINICAL DATA:  Generalized abdominal pain.  Rectal bleeding. EXAM: CT ABDOMEN AND PELVIS WITH CONTRAST TECHNIQUE: Multidetector CT imaging of the abdomen and pelvis was performed using the standard protocol following bolus administration of intravenous contrast. CONTRAST:  110mL ISOVUE-300 IOPAMIDOL (ISOVUE-300) INJECTION 61% COMPARISON:  09/30/2015 CT abdomen/pelvis. FINDINGS: Lower chest: No significant pulmonary nodules or acute consolidative airspace disease. Coronary atherosclerosis. Hepatobiliary: Normal liver with no liver mass. Cholecystectomy. No intrahepatic biliary ductal dilatation. Common bile duct diameter 9 mm, within expected post  cholecystectomy limits. No radiopaque choledocholithiasis. Pancreas: Cystic 2.3 cm pancreatic body mass (series 2/image 18), increased in size from 1.6 cm on 09/30/2015. No additional pancreatic masses. Stable mildly dilated ventral pancreatic duct in the pancreatic head (4 mm diameter). Spleen: Normal size. No mass. Adrenals/Urinary Tract: Normal adrenals. Subcentimeter hypodense renal cortical lesion in the lateral interpolar right kidney, too small to characterize, unchanged, most consistent with a benign renal cyst. Otherwise normal kidneys, with no hydronephrosis. Bladder visualization is limited by streak artifact from the left hip hardware, with no gross bladder abnormality. Stomach/Bowel: Small to moderate hiatal hernia. Otherwise grossly normal stomach. Large ventral abdominal wall midline hernia containing numerous small bowel loops has increased in size. No small bowel dilatation, pneumatosis or wall thickening. Status post subtotal right hemicolectomy with intact appearing ileocolic anastomosis in the right upper abdomen. Mild sigmoid diverticulosis. There is nonspecific circumferential wall thickening and mucosal hyperenhancement throughout the rectum with associated faint perirectal fat haziness. Vascular/Lymphatic: Atherosclerotic nonaneurysmal abdominal aorta. Patent portal, splenic, hepatic and renal veins. 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 No pathologically enlarged lymph nodes in the abdomen or pelvis. Reproductive: Moderately enlarged prostate, unchanged. Other: No pneumoperitoneum, ascites or focal fluid collection. Musculoskeletal: No aggressive appearing focal osseous lesions. Left total hip arthroplasty. Marked thoracolumbar spondylosis. IMPRESSION: 1. Nonspecific circumferential wall thickening and mucosal hyperenhancement throughout the rectum with associated faint perirectal fat haziness, most suggestive of an infectious or inflammatory proctitis, although a rectal  neoplasm can have a similar appearance. Clinical correlation is necessary. Consider endoscopic evaluation as clinically warranted. 2. Interval enlargement of large midline ventral abdominal wall hernia containing small bowel loops. No findings to suggest small bowel obstruction or ischemia. 3. Interval growth of 2.3 cm cystic pancreatic body mass. Stable mild dilatation of the ventral pancreatic duct. MRI of the abdomen without and with IV contrast is recommended on a short term outpatient basis for further characterization, as an enlarging cystic pancreatic neoplasm cannot be excluded. 4. Additional  findings include aortic atherosclerosis, coronary atherosclerosis, small to moderate hiatal hernia and moderate prostatomegaly. Electronically Signed   By: Ilona Sorrel M.D.   On: 04/24/2016 18:38     PREVIOUS ENDOSCOPIES:              Thanks   LOS: 0 days   San Jetty MD @  04/25/2016, 10:14 AM

## 2016-04-25 NOTE — Progress Notes (Signed)
Pt discharged via wheelchair by nursing to the visitor's entrance 

## 2016-04-25 NOTE — Progress Notes (Signed)
Pt discharging home accompanied by wife to self care.

## 2016-04-25 NOTE — Progress Notes (Signed)
No abdominal pain or rectal bleeding since fecal disimpaction. Tolerating po's and diet well. Pt and wife agreeable to discharge home. Oral and written AVS instructions completed with wife/pt included.

## 2016-04-25 NOTE — Discharge Summary (Signed)
[facility] [place] NAME: John Summers    MR#:  [id number]  DATE OF BIRTH:  [birth year]  DATE OF ADMISSION:  04/24/2016 ADMITTING PHYSICIAN: Lance Coon, MD  DATE OF DISCHARGE: 04/25/2016  PRIMARY CARE PHYSICIAN: Owens Loffler, MD    ADMISSION DIAGNOSIS:  Pancreatic cyst [K86.2] Generalized abdominal pain [R10.84] Heme positive stool [R19.5] Fecal impaction in rectum ([place]) [K56.41]  DISCHARGE DIAGNOSIS:  Principal Problem:   Blood per rectum Active Problems:   Essential hypertension   Lewy body dementia with behavioral disturbance   Pancreatic mass   SECONDARY DIAGNOSIS:   Past Medical History:  Diagnosis Date  . Colon adenocarcinoma ([place])   . Complication of anesthesia    severe confusion  . GERD (gastroesophageal reflux disease)   . History of colon polyps   . History of gallstones   . Hx of transfusion of platelets   . Hypertension   . ITP (idiopathic thrombocytopenic purpura)    Dr. Grayland Ormond follows  . Kidney stones   . Lewy body dementia with behavioral disturbance   . Major depressive disorder, recurrent episode, in partial remission with anxious distress ([place])   . Pancreatitis 2/11  . Pollen allergies   . Skin cancer    ears  . Sleep apnea    "won't wear mask anymore" (12/22/2012)  . Unspecified essential hypertension     HOSPITAL COURSE:   82 y.o.maleWith Lewy body dementia and estriol colon adenocarcinoma who presents with severe abdominal pain and fecal impaction.   1. Fecal impaction: Patient has a history of colon cancer past. Patient was evaluated by GI. Hemoglobin has remained relatively stable. Patient will need MiraLAX daily to prevent this from recurring. Patient has no signs of recurring lower GI bleed. Recommendations are to advance diet and continue MiraLAX. Patient will need to follow up with his outpatient primary gastroenterologist for consideration of flexible sigmoidoscopy.  2.  Possible pancreatic mass seen on CT scan: As per GI consultant this can be evaluated with an outpatient MRI and referral to primary gastroenterologist.  3. Lewy body dementia: Patient will resume donepezil and follow-up with his neurologist.  4. Hypokalemia: This was repleted prior to discharge.  DISCHARGE CONDITIONS AND DIET:    Stable for discharge on regular diet  CONSULTS OBTAINED:  Treatment Team:  San Jetty, MD  DRUG ALLERGIES:   Allergies  Allergen Reactions  . Aspirin   . Chicken Allergy     unknown    DISCHARGE MEDICATIONS:   Current Discharge Medication List    START taking these medications   Details  polyethylene glycol (MIRALAX / GLYCOLAX) packet Take 17 g by mouth daily. Qty: 14 each, Refills: 0      CONTINUE these medications which have NOT CHANGED   Details  clonazePAM (KLONOPIN) 0.5 MG tablet take 1/2 tablet by mouth at bedtime Qty: 45 tablet, Refills: 1    donepezil (ARICEPT) 10 MG tablet take 1 tablet by mouth once daily Qty: 30 tablet, Refills: 5    hydrOXYzine (ATARAX/VISTARIL) 10 MG tablet Take 10 mg Hydroxacen at night to suppress the skin scratching Qty: 90 tablet, Refills: 0    triamterene-hydrochlorothiazide (MAXZIDE-25) 37.5-25 MG tablet Take 0.5 tablets by mouth daily. Qty: 45 tablet, Refills: 1              Today   CHIEF COMPLAINT:  No acute events overnight. No signs of GI bleed.   VITAL SIGNS:  Blood pressure 111/63, pulse (!) 54, temperature 97.8  F (36.6 C), temperature source Oral, resp. rate 20, height 5\' 10"  (1.778 m), weight 97.2 kg (214 lb 5 oz), SpO2 98 %.   REVIEW OF SYSTEMS:  Review of Systems  Constitutional: Negative.  Negative for chills, fever and malaise/fatigue.  HENT: Negative.  Negative for ear discharge, ear pain, hearing loss, nosebleeds and sore throat.   Eyes: Negative.  Negative for blurred vision and pain.  Respiratory: Negative.  Negative for cough, hemoptysis, shortness of breath and  wheezing.   Cardiovascular: Negative.  Negative for chest pain, palpitations and leg swelling.  Gastrointestinal: Negative.  Negative for abdominal pain, blood in stool, diarrhea, nausea and vomiting.  Genitourinary: Negative.  Negative for dysuria.  Musculoskeletal: Negative.  Negative for back pain.  Skin: Negative.   Neurological: Positive for tremors. Negative for dizziness, speech change, focal weakness, seizures and headaches.  Endo/Heme/Allergies: Negative.  Does not bruise/bleed easily.  Psychiatric/Behavioral: Positive for memory loss. Negative for depression, hallucinations and suicidal ideas.     PHYSICAL EXAMINATION:  GENERAL:  82 y.o.-year-old patient lying in the bed with no acute distress.  NECK:  Supple, no jugular venous distention. No thyroid enlargement, no tenderness.  LUNGS: Normal breath sounds bilaterally, no wheezing, rales,rhonchi  No use of accessory muscles of respiration.  CARDIOVASCULAR: S1, S2 normal. No murmurs, rubs, or gallops.  ABDOMEN: Soft, non-tender, non-distended. Bowel sounds present. No organomegaly or mass.  EXTREMITIES: No pedal edema, cyanosis, or clubbing.  PSYCHIATRIC: The patient is alert and oriented x name  SKIN: No obvious rash, lesion, or ulcer.   DATA REVIEW:   CBC  Recent Labs Lab 04/25/16 0426  WBC 3.8  HGB 11.6*  HCT 31.9*  PLT 82*    Chemistries   Recent Labs Lab 04/24/16 1617 04/25/16 0426  NA 141 140  K 3.6 3.1*  CL 108 107  CO2 26 26  GLUCOSE 104* 103*  BUN 22* 19  CREATININE 0.89 0.90  CALCIUM 8.8* 8.1*  AST 19  --   ALT 18  --   ALKPHOS 50  --   BILITOT 0.6  --     Cardiac Enzymes  Recent Labs Lab 04/24/16 1617  TROPONINI <0.03    Microbiology Results  @[email or web address] @  RADIOLOGY:  Ct Abdomen Pelvis W Contrast  Result Date: 04/24/2016 CLINICAL DATA:  Generalized abdominal pain.  Rectal bleeding. EXAM: CT ABDOMEN AND PELVIS WITH CONTRAST TECHNIQUE: Multidetector CT imaging of the abdomen and  pelvis was performed using the standard protocol following bolus administration of intravenous contrast. CONTRAST:  187mL ISOVUE-300 IOPAMIDOL (ISOVUE-300) INJECTION 61% COMPARISON:  09/30/2015 CT abdomen/pelvis. FINDINGS: Lower chest: No significant pulmonary nodules or acute consolidative airspace disease. Coronary atherosclerosis. Hepatobiliary: Normal liver with no liver mass. Cholecystectomy. No intrahepatic biliary ductal dilatation. Common bile duct diameter 9 mm, within expected post cholecystectomy limits. No radiopaque choledocholithiasis. Pancreas: Cystic 2.3 cm pancreatic body mass (series 2/image 18), increased in size from 1.6 cm on 09/30/2015. No additional pancreatic masses. Stable mildly dilated ventral pancreatic duct in the pancreatic head (4 mm diameter). Spleen: Normal size. No mass. Adrenals/Urinary Tract: Normal adrenals. Subcentimeter hypodense renal cortical lesion in the lateral interpolar right kidney, too small to characterize, unchanged, most consistent with a benign renal cyst. Otherwise normal kidneys, with no hydronephrosis. Bladder visualization is limited by streak artifact from the left hip hardware, with no gross bladder abnormality. Stomach/Bowel: Small to moderate hiatal hernia. Otherwise grossly normal stomach. Large ventral abdominal wall midline hernia containing numerous small bowel loops has increased in size. No  small bowel dilatation, pneumatosis or wall thickening. Status post subtotal right hemicolectomy with intact appearing ileocolic anastomosis in the right upper abdomen. Mild sigmoid diverticulosis. There is nonspecific circumferential wall thickening and mucosal hyperenhancement throughout the rectum with associated faint perirectal fat haziness. Vascular/Lymphatic: Atherosclerotic nonaneurysmal abdominal aorta. Patent portal, splenic, hepatic and renal veins. 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 No pathologically enlarged lymph nodes in the abdomen  or pelvis. Reproductive: Moderately enlarged prostate, unchanged. Other: No pneumoperitoneum, ascites or focal fluid collection. Musculoskeletal: No aggressive appearing focal osseous lesions. Left total hip arthroplasty. Marked thoracolumbar spondylosis. IMPRESSION: 1. Nonspecific circumferential wall thickening and mucosal hyperenhancement throughout the rectum with associated faint perirectal fat haziness, most suggestive of an infectious or inflammatory proctitis, although a rectal neoplasm can have a similar appearance. Clinical correlation is necessary. Consider endoscopic evaluation as clinically warranted. 2. Interval enlargement of large midline ventral abdominal wall hernia containing small bowel loops. No findings to suggest small bowel obstruction or ischemia. 3. Interval growth of 2.3 cm cystic pancreatic body mass. Stable mild dilatation of the ventral pancreatic duct. MRI of the abdomen without and with IV contrast is recommended on a short term outpatient basis for further characterization, as an enlarging cystic pancreatic neoplasm cannot be excluded. 4. Additional findings include aortic atherosclerosis, coronary atherosclerosis, small to moderate hiatal hernia and moderate prostatomegaly. Electronically Signed   By: Ilona Sorrel M.D.   On: 04/24/2016 18:38      Management plans discussed with the patient and wife and they are in agreement. Stable for discharge home  Patient should follow up with outpatient GI  CODE STATUS:     Code Status Orders        Start     Ordered   04/24/16 2153  Full code  Continuous     04/24/16 2152    Code Status History    Date Active Date Inactive Code Status Order ID Comments User Context   12/22/2012 12:33 PM 12/23/2012  1:59 PM Full Code [id number]  Harl Bowie, MD Inpatient   03/21/2012  2:57 PM 03/28/2012  8:18 PM Full Code [id number]  Suezanne Cheshire, RN Inpatient    Advance Directive Documentation   Flowsheet Row Most Recent  Value  Type of Advance Directive  Healthcare Power of Attorney  Pre-existing out of facility DNR order (yellow form or pink MOST form)  No data  "MOST" Form in Place?  No data      TOTAL TIME TAKING CARE OF THIS PATIENT: 35 minutes.    Note: This dictation was prepared with Dragon dictation along with smaller phrase technology. Any transcriptional errors that result from this process are unintentional.  Chivas Notz M.D on 04/25/2016 at 11:41 AM  Between 7am to 6pm - Pager - [phone] After 6pm go to [email or web address] - [facility]  Office  [phone]  CC: Primary care physician; Owens Loffler, MD

## 2016-04-25 NOTE — Progress Notes (Signed)
MD order received to discharge pt home today; verbally reviewed AVS with pt and pt's spouse including medications/new medication Miralax; no questions voiced at this time; discharge pending pt eating lunch and getting dressed

## 2016-04-25 NOTE — Care Management Obs Status (Signed)
[facility] NOTIFICATION   Patient Details  Name: John Summers MRN: [id number] Date of Birth: 26-Apr-1933   Medicare Observation Status Notification Given:   Not given due to patient discharged.    Beau Fanny, RN 04/25/2016, 5:26 PM

## 2016-04-26 LAB — CANCER ANTIGEN 19-9: CA 19-9: 6 U/mL (ref 0–35)

## 2016-04-27 ENCOUNTER — Telehealth: Payer: Self-pay | Admitting: Gastroenterology

## 2016-04-27 NOTE — Telephone Encounter (Signed)
Message sent to Dr Ardis Hughs to review the recent CT scan and advise on urgency of scheduling.

## 2016-04-27 NOTE — Telephone Encounter (Signed)
Milus Banister, MD  Barron Alvine, RN        Next available rov is fine, thanks.    dj

## 2016-04-28 ENCOUNTER — Telehealth: Payer: Self-pay

## 2016-04-28 ENCOUNTER — Telehealth: Payer: Self-pay | Admitting: Gastroenterology

## 2016-04-28 NOTE — Telephone Encounter (Signed)
1st attempt for TCM outreach. Attempt successful.  Transition Care Management Follow-up Telephone Call    Date discharged? 04/25/2016  How have you been since you were released from the hospital? [place]. Pt is still experiencing general malaise. Pt's spouse states dementia is getting worse.   Any patient concerns? None at this time    Items Reviewed:  Medications reviewed: Yes  Allergies reviewed: Yes  Dietary changes reviewed: Yes  Referrals reviewed: Yes   Functional Questionnaire:  Independent - I Dependent - D    Activities of Daily Living (ADLs):    Personal hygiene - D Dressing - D Eating - I Maintaining continence - I Transferring - I  Independent Activities of Daily Living (iADLs): Basic communication skills - D Transportation - D Meal preparation  - D Shopping - D Housework - D Managing medications - D Managing personal finances - D   Confirmed importance and date/time of follow-up visits scheduled YES  Provider Appointment booked with PCP 05/04/2016 @ 1515  Confirmed with patient if condition begins to worsen call PCP or go to the ER.  Patient was given the office number and encouraged to call back with question or concerns: YES

## 2016-04-28 NOTE — Telephone Encounter (Signed)
The pt has been notified of the appt and verbalized understanding.

## 2016-04-28 NOTE — Telephone Encounter (Signed)
Pt aware that the miralax is ok to take.  He will keep f/u appt in October.

## 2016-04-29 ENCOUNTER — Telehealth: Payer: Self-pay | Admitting: *Deleted

## 2016-04-29 NOTE — Telephone Encounter (Signed)
Notified per Dr Grayland Ormond leave appts as is

## 2016-04-29 NOTE — Telephone Encounter (Signed)
Called to ask if he needs to be seen before his next scheduled lab appt on 8/28 since he was discharged from hospital on 8/12. Please advise

## 2016-05-04 ENCOUNTER — Encounter: Payer: Self-pay | Admitting: Family Medicine

## 2016-05-04 ENCOUNTER — Other Ambulatory Visit: Payer: Self-pay | Admitting: Family Medicine

## 2016-05-04 ENCOUNTER — Ambulatory Visit (INDEPENDENT_AMBULATORY_CARE_PROVIDER_SITE_OTHER): Payer: Medicare Other | Admitting: Family Medicine

## 2016-05-04 VITALS — BP 137/84 | HR 66 | Temp 98.4°F | Ht 68.0 in | Wt 217.5 lb

## 2016-05-04 DIAGNOSIS — K869 Disease of pancreas, unspecified: Secondary | ICD-10-CM

## 2016-05-04 DIAGNOSIS — D696 Thrombocytopenia, unspecified: Secondary | ICD-10-CM

## 2016-05-04 DIAGNOSIS — C189 Malignant neoplasm of colon, unspecified: Secondary | ICD-10-CM

## 2016-05-04 DIAGNOSIS — F0281 Dementia in other diseases classified elsewhere with behavioral disturbance: Secondary | ICD-10-CM

## 2016-05-04 DIAGNOSIS — C180 Malignant neoplasm of cecum: Secondary | ICD-10-CM

## 2016-05-04 DIAGNOSIS — R935 Abnormal findings on diagnostic imaging of other abdominal regions, including retroperitoneum: Secondary | ICD-10-CM

## 2016-05-04 DIAGNOSIS — Z8509 Personal history of malignant neoplasm of other digestive organs: Secondary | ICD-10-CM

## 2016-05-04 DIAGNOSIS — K8689 Other specified diseases of pancreas: Secondary | ICD-10-CM

## 2016-05-04 DIAGNOSIS — K625 Hemorrhage of anus and rectum: Secondary | ICD-10-CM

## 2016-05-04 DIAGNOSIS — G3183 Dementia with Lewy bodies: Secondary | ICD-10-CM

## 2016-05-04 NOTE — Patient Instructions (Signed)

## 2016-05-04 NOTE — Progress Notes (Signed)
Pre visit review using our clinic review tool, if applicable. No additional management support is needed unless otherwise documented below in the visit note. 

## 2016-05-04 NOTE — Progress Notes (Signed)
Dr. Frederico Hamman T. Fiorela Pelzer, MD, [facility] [place], [address] Phone: [phone] Fax: [phone]  05/04/2016  Patient: John Summers, MRN: [id number], DOB: Nov 08, 1932, 82 y.o.  Primary Physician:  Owens Loffler, MD   Chief Complaint  Patient presents with  . Hospitalization Follow-up   Subjective:   John Summers is a 82 y.o. very pleasant male patient who presents with the following:  DATE OF ADMISSION:  04/24/2016              ADMITTING PHYSICIAN: Lance Coon, MD  DATE OF DISCHARGE: 04/25/2016  PRIMARY CARE PHYSICIAN: Owens Loffler, MD    ADMISSION DIAGNOSIS:  Pancreatic cyst [K86.2] Generalized abdominal pain [R10.84] Heme positive stool [R19.5] Fecal impaction in rectum ([place]) [K56.41]   The patient went to the emergency room with generalized abdominal pain and had a large fecal impaction which was removed in the emergency room which provided immediately relief.  He did have some bright red blood per rectum, and followed up with a CT scan of the abdomen and pelvis, which showed a mass of the pancreas which has increased in size compared to a CT that was done in January 2017.  Both inpatient oncology as well as GI or consulted.  They recommended outpatient follow-up and management.  Past Medical History, Surgical History, Social History, Family History, Problem List, Medications, and Allergies have been reviewed and updated if relevant.  Patient Active Problem List   Diagnosis Date Noted  . Lewy body dementia with behavioral disturbance 11/16/2014    Priority: High  . Colon cancer ([place]) 03/19/2012    Priority: High  . Adenocarcinoma of cecum ([place]) 03/19/2012    Priority: High  . Sleep apnea     Priority: Medium  . Essential hypertension     Priority: Medium  . Blood per rectum 04/24/2016  . Pancreatic mass 04/24/2016  . Malignant neoplasm of colon ([place]) 11/11/2015  . Thrombocytopenia  ([place]) 10/04/2015  . Uncontrolled REM sleep behavior disorder 11/16/2014  . Tremor of right hand 11/16/2014  . Shuffling gait 11/16/2014  . Major depressive disorder, recurrent episode, in partial remission with anxious distress ([place])   . REM sleep behavior disorder 12/07/2013  . Incisional hernia, without obstruction or gangrene 11/14/2012  . GERD (gastroesophageal reflux disease) 03/19/2012  . Pollen allergies   . Kidney stones     Past Medical History:  Diagnosis Date  . Colon adenocarcinoma ([place])   . Complication of anesthesia    severe confusion  . GERD (gastroesophageal reflux disease)   . History of colon polyps   . History of gallstones   . Hx of transfusion of platelets   . Hypertension   . ITP (idiopathic thrombocytopenic purpura)    Dr. Grayland Ormond follows  . Kidney stones   . Lewy body dementia with behavioral disturbance   . Major depressive disorder, recurrent episode, in partial remission with anxious distress ([place])   . Pancreatitis 2/11  . Pollen allergies   . Skin cancer    ears  . Sleep apnea    "won't wear mask anymore" (12/22/2012)  . Unspecified essential hypertension     Past Surgical History:  Procedure Laterality Date  . CARDIAC CATHETERIZATION  2000's   "tx'd w/acid reflux RX" (12/22/2012)  . CHOLECYSTECTOMY  2011  . ESOPHAGEAL DILATION  1990's?  Marland Kitchen HERNIA REPAIR  12/22/2012   IHR w/mesh  . INGUINAL HERNIA REPAIR Left [id number]  . INSERTION OF MESH  N/A 12/22/2012   Procedure: INSERTION OF MESH;  Surgeon: Harl Bowie, MD;  Location: [employer] [facility];  Service: General;  Laterality: N/A;  . LITHOTRIPSY  2008   x2  . PARTIAL COLECTOMY  03/21/2012   Procedure: PARTIAL COLECTOMY;  Surgeon: Harl Bowie, MD;  Location: [facility];  Service: General;  Laterality: Right;  . PILONIDAL CYST EXCISION  1960's?  Marland Kitchen SKIN CANCER EXCISION     "face; ?left ear" (12/22/2012)  . TONSILLECTOMY AND ADENOIDECTOMY  1946  . TOTAL HIP ARTHROPLASTY Left ~ 2002  . UPPER  GASTROINTESTINAL ENDOSCOPY    . VENTRAL HERNIA REPAIR N/A 12/22/2012   Procedure: HERNIA REPAIR INCISIONAL ;  Surgeon: Harl Bowie, MD;  Location: [facility];  Service: General;  Laterality: N/A;    Social History   Social History  . Marital status: Married    Spouse name: John Summers  . Number of children: 2  . Years of education: college   Occupational History  .  Retired    Retired, Production manager, Investment banker, corporate   Social History Main Topics  . Smoking status: Former Smoker    Types: Pipe  . Smokeless tobacco: Never Used     Comment: i4/06/2013 "only smoked pipe n college 1958"  . Alcohol use No  . Drug use: No  . Sexual activity: No   Other Topics Concern  . Not on file   Social History Narrative   Patient is married John Summers) and lives with his wife.   Patient has two children and his wife has two children..   Patient is retired.   Patient has a college education.   Patient is right-handed.   Patient drinks 1-2 cups of coffee daily.    Family History  Problem Relation Age of Onset  . Colon cancer Mother   . Heart disease Mother   . Hypertension Mother   . Alcohol abuse Father   . Esophageal cancer Neg Hx   . Rectal cancer Neg Hx   . Stomach cancer Neg Hx     Allergies  Allergen Reactions  . Aspirin   . Chicken Allergy     unknown    Medication list reviewed and updated in full in [facility].  GEN: No acute illnesses, no fevers, chills. GI: No n/v/d, eating normally Pulm: No SOB Interactive and getting along well at home.  Otherwise, ROS is as per the HPI.  Objective:   BP 137/84   Pulse 66   Temp 98.4 F (36.9 C) (Oral)   Ht 5\' 8"  (1.727 m)   Wt 217 lb 8 oz (98.7 kg)   BMI 33.07 kg/m   GEN: WDWN, NAD, Non-toxic, A & O x 3 HEENT: Atraumatic, Normocephalic. Neck supple. No masses, No LAD. Ears and Summers: No external deformity. CV: RRR, No M/G/R. No JVD. No thrill. No extra heart sounds. PULM: CTA B, no wheezes, crackles, rhonchi.  No retractions. No resp. distress. No accessory muscle use. EXTR: No c/c/e NEURO Normal gait.  PSYCH: Normally interactive. Conversant. Not depressed or anxious appearing.  Calm demeanor.   Laboratory and Imaging Data: Ct Abdomen Pelvis W Contrast  Result Date: 04/24/2016 CLINICAL DATA:  Generalized abdominal pain.  Rectal bleeding. EXAM: CT ABDOMEN AND PELVIS WITH CONTRAST TECHNIQUE: Multidetector CT imaging of the abdomen and pelvis was performed using the standard protocol following bolus administration of intravenous contrast. CONTRAST:  174mL ISOVUE-300 IOPAMIDOL (ISOVUE-300) INJECTION 61% COMPARISON:  09/30/2015 CT abdomen/pelvis. FINDINGS: Lower chest: No significant pulmonary nodules  or acute consolidative airspace disease. Coronary atherosclerosis. Hepatobiliary: Normal liver with no liver mass. Cholecystectomy. No intrahepatic biliary ductal dilatation. Common bile duct diameter 9 mm, within expected post cholecystectomy limits. No radiopaque choledocholithiasis. Pancreas: Cystic 2.3 cm pancreatic body mass (series 2/image 18), increased in size from 1.6 cm on 09/30/2015. No additional pancreatic masses. Stable mildly dilated ventral pancreatic duct in the pancreatic head (4 mm diameter). Spleen: Normal size. No mass. Adrenals/Urinary Tract: Normal adrenals. Subcentimeter hypodense renal cortical lesion in the lateral interpolar right kidney, too small to characterize, unchanged, most consistent with a benign renal cyst. Otherwise normal kidneys, with no hydronephrosis. Bladder visualization is limited by streak artifact from the left hip hardware, with no gross bladder abnormality. Stomach/Bowel: Small to moderate hiatal hernia. Otherwise grossly normal stomach. Large ventral abdominal wall midline hernia containing numerous small bowel loops has increased in size. No small bowel dilatation, pneumatosis or wall thickening. Status post subtotal right hemicolectomy with intact appearing ileocolic  anastomosis in the right upper abdomen. Mild sigmoid diverticulosis. There is nonspecific circumferential wall thickening and mucosal hyperenhancement throughout the rectum with associated faint perirectal fat haziness. Vascular/Lymphatic: Atherosclerotic nonaneurysmal abdominal aorta. Patent portal, splenic, hepatic and renal veins. 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 No pathologically enlarged lymph nodes in the abdomen or pelvis. Reproductive: Moderately enlarged prostate, unchanged. Other: No pneumoperitoneum, ascites or focal fluid collection. Musculoskeletal: No aggressive appearing focal osseous lesions. Left total hip arthroplasty. Marked thoracolumbar spondylosis. IMPRESSION: 1. Nonspecific circumferential wall thickening and mucosal hyperenhancement throughout the rectum with associated faint perirectal fat haziness, most suggestive of an infectious or inflammatory proctitis, although a rectal neoplasm can have a similar appearance. Clinical correlation is necessary. Consider endoscopic evaluation as clinically warranted. 2. Interval enlargement of large midline ventral abdominal wall hernia containing small bowel loops. No findings to suggest small bowel obstruction or ischemia. 3. Interval growth of 2.3 cm cystic pancreatic body mass. Stable mild dilatation of the ventral pancreatic duct. MRI of the abdomen without and with IV contrast is recommended on a short term outpatient basis for further characterization, as an enlarging cystic pancreatic neoplasm cannot be excluded. 4. Additional findings include aortic atherosclerosis, coronary atherosclerosis, small to moderate hiatal hernia and moderate prostatomegaly. Electronically Signed   By: Ilona Sorrel M.D.   On: 04/24/2016 18:38   Results for orders placed or performed during the hospital encounter of 04/24/16  CBC with Differential  Result Value Ref Range   WBC 3.3 (L) 3.8 - 10.6 K/uL   RBC 3.66 (L) 4.40 - 5.90 MIL/uL    Hemoglobin 13.2 13.0 - 18.0 g/dL   HCT 37.7 (L) 40.0 - 52.0 %   MCV 103.0 (H) 80.0 - 100.0 fL   MCH 36.1 (H) 26.0 - 34.0 pg   MCHC 35.1 32.0 - 36.0 g/dL   RDW 13.6 11.5 - 14.5 %   Platelets 102 (L) 150 - 440 K/uL   Neutrophils Relative % 66 %   Neutro Abs 2.2 1.4 - 6.5 K/uL   Lymphocytes Relative 22 %   Lymphs Abs 0.7 (L) 1.0 - 3.6 K/uL   Monocytes Relative 12 %   Monocytes Absolute 0.4 0.2 - 1.0 K/uL   Eosinophils Relative 0 %   Eosinophils Absolute 0.0 0 - 0.7 K/uL   Basophils Relative 0 %   Basophils Absolute 0.0 0 - 0.1 K/uL  Urinalysis complete, with microscopic  Result Value Ref Range   Color, Urine YELLOW (A) YELLOW   APPearance CLEAR (A) CLEAR  Glucose, UA NEGATIVE NEGATIVE mg/dL   Bilirubin Urine NEGATIVE NEGATIVE   Ketones, ur NEGATIVE NEGATIVE mg/dL   Specific Gravity, Urine 1.027 1.005 - 1.030   Hgb urine dipstick NEGATIVE NEGATIVE   pH 5.0 5.0 - 8.0   Protein, ur NEGATIVE NEGATIVE mg/dL   Nitrite NEGATIVE NEGATIVE   Leukocytes, UA NEGATIVE NEGATIVE   RBC / HPF 0-5 0 - 5 RBC/hpf   WBC, UA 0-5 0 - 5 WBC/hpf   Bacteria, UA NONE SEEN NONE SEEN   Squamous Epithelial / LPF 0-5 (A) NONE SEEN  Lipase, blood  Result Value Ref Range   Lipase 30 11 - 51 U/L  Comprehensive metabolic panel  Result Value Ref Range   Sodium 141 135 - 145 mmol/L   Potassium 3.6 3.5 - 5.1 mmol/L   Chloride 108 101 - 111 mmol/L   CO2 26 22 - 32 mmol/L   Glucose, Bld 104 (H) 65 - 99 mg/dL   BUN 22 (H) 6 - 20 mg/dL   Creatinine, Ser 0.89 0.61 - 1.24 mg/dL   Calcium 8.8 (L) 8.9 - 10.3 mg/dL   Total Protein 7.2 6.5 - 8.1 g/dL   Albumin 3.8 3.5 - 5.0 g/dL   AST 19 15 - 41 U/L   ALT 18 17 - 63 U/L   Alkaline Phosphatase 50 38 - 126 U/L   Total Bilirubin 0.6 0.3 - 1.2 mg/dL   GFR calc non Af Amer >60 >60 mL/min   GFR calc Af Amer >60 >60 mL/min   Anion gap 7 5 - 15  Troponin I  Result Value Ref Range   Troponin I <0.03 <0.03 ng/mL  Protime-INR  Result Value Ref Range   Prothrombin  Time 14.3 11.4 - 15.2 seconds   INR 0000000   Basic metabolic panel  Result Value Ref Range   Sodium 140 135 - 145 mmol/L   Potassium 3.1 (L) 3.5 - 5.1 mmol/L   Chloride 107 101 - 111 mmol/L   CO2 26 22 - 32 mmol/L   Glucose, Bld 103 (H) 65 - 99 mg/dL   BUN 19 6 - 20 mg/dL   Creatinine, Ser 0.90 0.61 - 1.24 mg/dL   Calcium 8.1 (L) 8.9 - 10.3 mg/dL   GFR calc non Af Amer >60 >60 mL/min   GFR calc Af Amer >60 >60 mL/min   Anion gap 7 5 - 15  CBC  Result Value Ref Range   WBC 3.8 3.8 - 10.6 K/uL   RBC 3.14 (L) 4.40 - 5.90 MIL/uL   Hemoglobin 11.6 (L) 13.0 - 18.0 g/dL   HCT 31.9 (L) 40.0 - 52.0 %   MCV 101.4 (H) 80.0 - 100.0 fL   MCH 37.0 (H) 26.0 - 34.0 pg   MCHC 36.5 (H) 32.0 - 36.0 g/dL   RDW 13.5 11.5 - 14.5 %   Platelets 82 (L) 150 - 440 K/uL  Vitamin B12  Result Value Ref Range   Vitamin B-12 200 180 - 914 pg/mL  Folate  Result Value Ref Range   Folate 13.1 >5.9 ng/mL  Iron and TIBC  Result Value Ref Range   Iron 30 (L) 45 - 182 ug/dL   TIBC 209 (L) 250 - 450 ug/dL   Saturation Ratios 14 (L) 17.9 - 39.5 %   UIBC 179 ug/dL  Ferritin  Result Value Ref Range   Ferritin 112 24 - 336 ng/mL  Cancer antigen 19-9  Result Value Ref Range   CA 19-9 6 0 - 35  U/mL     Assessment and Plan:   Pancreatic mass - Plan: MR Abdomen W Wo Contrast  Abnormal abdominal CT scan - Plan: MR Abdomen W Wo Contrast  Malignant neoplasm of colon, unspecified part of colon ([place])  Lewy body dementia with behavioral disturbance  Adenocarcinoma of cecum ([facility])  Thrombocytopenia ([facility])  Blood per rectum  Given the increased size of pancreatic lesion on CT scan from January 2017 until August 20 17, obtain an MRI of the abdomen with and without contrast to evaluate for neoplasm.  He also has a history of colon cancer.  He has follow-up with his regular GI doctor in 2 months  Follow-up: depend on f/u study results  Orders Placed This Encounter  Procedures  . MR Abdomen W Wo Contrast     Signed,  Nobie Alleyne T. Palmira Stickle, MD   Patient's Medications  New Prescriptions   No medications on file  Previous Medications   CLONAZEPAM (KLONOPIN) 0.5 MG TABLET    take 1/2 tablet by mouth at bedtime   DONEPEZIL (ARICEPT) 10 MG TABLET    take 1 tablet by mouth once daily   HYDROXYZINE (ATARAX/VISTARIL) 10 MG TABLET    Take 10 mg Hydroxacen at night to suppress the skin scratching   POLYETHYLENE GLYCOL (MIRALAX / GLYCOLAX) PACKET    Take 17 g by mouth daily.   TRIAMTERENE-HYDROCHLOROTHIAZIDE (MAXZIDE-25) 37.5-25 MG TABLET    take 1/2 tablet by mouth once daily  Modified Medications   No medications on file  Discontinued Medications   No medications on file

## 2016-05-06 ENCOUNTER — Inpatient Hospital Stay: Payer: Medicare Other | Attending: Oncology | Admitting: *Deleted

## 2016-05-06 DIAGNOSIS — R5383 Other fatigue: Secondary | ICD-10-CM | POA: Diagnosis not present

## 2016-05-06 DIAGNOSIS — C180 Malignant neoplasm of cecum: Secondary | ICD-10-CM | POA: Diagnosis not present

## 2016-05-06 DIAGNOSIS — F3341 Major depressive disorder, recurrent, in partial remission: Secondary | ICD-10-CM | POA: Diagnosis not present

## 2016-05-06 DIAGNOSIS — G4730 Sleep apnea, unspecified: Secondary | ICD-10-CM | POA: Insufficient documentation

## 2016-05-06 DIAGNOSIS — R531 Weakness: Secondary | ICD-10-CM | POA: Diagnosis not present

## 2016-05-06 DIAGNOSIS — F0280 Dementia in other diseases classified elsewhere without behavioral disturbance: Secondary | ICD-10-CM | POA: Diagnosis not present

## 2016-05-06 DIAGNOSIS — Z85828 Personal history of other malignant neoplasm of skin: Secondary | ICD-10-CM | POA: Insufficient documentation

## 2016-05-06 DIAGNOSIS — Z79899 Other long term (current) drug therapy: Secondary | ICD-10-CM | POA: Diagnosis not present

## 2016-05-06 DIAGNOSIS — G3183 Dementia with Lewy bodies: Secondary | ICD-10-CM | POA: Diagnosis not present

## 2016-05-06 DIAGNOSIS — K869 Disease of pancreas, unspecified: Secondary | ICD-10-CM | POA: Diagnosis not present

## 2016-05-06 DIAGNOSIS — Z87442 Personal history of urinary calculi: Secondary | ICD-10-CM | POA: Diagnosis not present

## 2016-05-06 DIAGNOSIS — I10 Essential (primary) hypertension: Secondary | ICD-10-CM | POA: Insufficient documentation

## 2016-05-06 DIAGNOSIS — K219 Gastro-esophageal reflux disease without esophagitis: Secondary | ICD-10-CM | POA: Insufficient documentation

## 2016-05-06 DIAGNOSIS — C189 Malignant neoplasm of colon, unspecified: Secondary | ICD-10-CM

## 2016-05-06 DIAGNOSIS — F419 Anxiety disorder, unspecified: Secondary | ICD-10-CM | POA: Insufficient documentation

## 2016-05-06 DIAGNOSIS — D472 Monoclonal gammopathy: Secondary | ICD-10-CM | POA: Insufficient documentation

## 2016-05-06 DIAGNOSIS — D72819 Decreased white blood cell count, unspecified: Secondary | ICD-10-CM | POA: Insufficient documentation

## 2016-05-06 DIAGNOSIS — Z86010 Personal history of colonic polyps: Secondary | ICD-10-CM | POA: Insufficient documentation

## 2016-05-06 DIAGNOSIS — Z8719 Personal history of other diseases of the digestive system: Secondary | ICD-10-CM | POA: Diagnosis not present

## 2016-05-06 DIAGNOSIS — Z800 Family history of malignant neoplasm of digestive organs: Secondary | ICD-10-CM | POA: Insufficient documentation

## 2016-05-06 DIAGNOSIS — D696 Thrombocytopenia, unspecified: Secondary | ICD-10-CM | POA: Diagnosis not present

## 2016-05-06 DIAGNOSIS — Z87891 Personal history of nicotine dependence: Secondary | ICD-10-CM | POA: Insufficient documentation

## 2016-05-06 LAB — CBC WITH DIFFERENTIAL/PLATELET
Basophils Absolute: 0 10*3/uL (ref 0–0.1)
Basophils Relative: 1 %
EOS PCT: 1 %
Eosinophils Absolute: 0 10*3/uL (ref 0–0.7)
HCT: 36.1 % — ABNORMAL LOW (ref 40.0–52.0)
HEMOGLOBIN: 12.7 g/dL — AB (ref 13.0–18.0)
LYMPHS ABS: 1.2 10*3/uL (ref 1.0–3.6)
LYMPHS PCT: 43 %
MCH: 35.9 pg — AB (ref 26.0–34.0)
MCHC: 35.2 g/dL (ref 32.0–36.0)
MCV: 102 fL — AB (ref 80.0–100.0)
Monocytes Absolute: 0.4 10*3/uL (ref 0.2–1.0)
Monocytes Relative: 14 %
NEUTROS PCT: 41 %
Neutro Abs: 1.1 10*3/uL — ABNORMAL LOW (ref 1.4–6.5)
Platelets: 103 10*3/uL — ABNORMAL LOW (ref 150–440)
RBC: 3.54 MIL/uL — AB (ref 4.40–5.90)
RDW: 13.1 % (ref 11.5–14.5)
WBC: 2.7 10*3/uL — AB (ref 3.8–10.6)

## 2016-05-07 LAB — PROTEIN ELECTROPHORESIS, SERUM
A/G RATIO SPE: 1.1 (ref 0.7–1.7)
ALBUMIN ELP: 3.7 g/dL (ref 2.9–4.4)
ALPHA-2-GLOBULIN: 0.6 g/dL (ref 0.4–1.0)
Alpha-1-Globulin: 0.2 g/dL (ref 0.0–0.4)
BETA GLOBULIN: 2.1 g/dL — AB (ref 0.7–1.3)
GAMMA GLOBULIN: 0.6 g/dL (ref 0.4–1.8)
Globulin, Total: 3.5 g/dL (ref 2.2–3.9)
M-Spike, %: 1.5 g/dL — ABNORMAL HIGH
Total Protein ELP: 7.2 g/dL (ref 6.0–8.5)

## 2016-05-07 LAB — IGG, IGA, IGM
IGA: 66 mg/dL (ref 61–437)
IGM, SERUM: 30 mg/dL (ref 15–143)
IgG (Immunoglobin G), Serum: 2205 mg/dL — ABNORMAL HIGH (ref 700–1600)

## 2016-05-07 LAB — CEA: CEA: 2.3 ng/mL (ref 0.0–4.7)

## 2016-05-10 DIAGNOSIS — D472 Monoclonal gammopathy: Secondary | ICD-10-CM | POA: Insufficient documentation

## 2016-05-10 NOTE — Progress Notes (Signed)
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  INTERVAL HISTORY: Patient returns to clinic today for laboratory work and routine clinical evaluation. He feels his memory continues to decline, but otherwise feels well. He has had no recent fevers.  He denies any easy bleeding or bruising.  He denies any weakness or fatigue. He has a good appetite and has maintained his weight.  He denies any chest pain, shortness breath, cough, or hemoptysis.  He denies any bony pain. He denies nausea or constipation.  He denies any melena or hematochezia.  Patient offers no further specific complaints today.   REVIEW OF SYSTEMS:   Review of Systems  Constitutional: Positive for malaise/fatigue. Negative for fever and weight loss.  Eyes: Negative.   Respiratory: Negative.  Negative for cough and shortness of breath.   Cardiovascular: Negative.  Negative for chest pain.  Gastrointestinal: Negative.  Negative for diarrhea and vomiting.  Genitourinary: Negative.   Musculoskeletal: Negative.   Skin: Negative.   Neurological: Positive for weakness.  Endo/Heme/Allergies: Negative.   Psychiatric/Behavioral: Positive for memory loss. The patient is nervous/anxious.     As per HPI. Otherwise, a complete review of systems is negatve.  PAST MEDICAL HISTORY: Past Medical History:  Diagnosis Date  . Colon adenocarcinoma ([place])   . Complication of anesthesia    severe confusion  . GERD (gastroesophageal reflux disease)   . History of colon polyps   . History of gallstones   . Hx of transfusion of platelets   . Hypertension   . ITP (idiopathic thrombocytopenic purpura)    Dr. Grayland Ormond follows  . Kidney stones   . Lewy body dementia with behavioral disturbance   . Major depressive disorder, recurrent episode, in partial remission with anxious distress ([place])   . Pancreatitis 2/11  . Pollen allergies   . Skin cancer    ears  . Sleep apnea    "won't wear mask anymore" (12/22/2012)  . Unspecified essential hypertension     PAST SURGICAL HISTORY: Past Surgical History:  Procedure Laterality Date  . CARDIAC CATHETERIZATION  2000's   "tx'd w/acid reflux RX" (12/22/2012)  . CHOLECYSTECTOMY  2011  . ESOPHAGEAL DILATION  1990's?  Marland Kitchen HERNIA REPAIR  12/22/2012   IHR w/mesh  . INGUINAL HERNIA REPAIR Left [id number]  . INSERTION OF MESH N/A 12/22/2012   Procedure: INSERTION OF MESH;  Surgeon: Harl Bowie, MD;  Location: [facility];  Service: General;  Laterality: N/A;  . LITHOTRIPSY  2008   x2  . PARTIAL COLECTOMY  03/21/2012   Procedure: PARTIAL COLECTOMY;  Surgeon: Harl Bowie, MD;  Location: [facility];  Service: General;  Laterality: Right;  . PILONIDAL CYST EXCISION  1960's?  Marland Kitchen SKIN CANCER EXCISION     "face; ?left ear" (12/22/2012)  . TONSILLECTOMY AND ADENOIDECTOMY  1946  . TOTAL HIP ARTHROPLASTY Left ~ 2002  . UPPER GASTROINTESTINAL ENDOSCOPY    . VENTRAL HERNIA REPAIR N/A 12/22/2012   Procedure: HERNIA REPAIR INCISIONAL ;  Surgeon: Harl Bowie, MD;  Location: [facility];  Service: General;  Laterality: N/A;    FAMILY HISTORY Family History  Problem Relation Age of Onset  . Colon cancer Mother   . Heart disease Mother   . Hypertension Mother   . Alcohol abuse Father   . Esophageal cancer Neg Hx   . Rectal cancer Neg Hx   . Stomach cancer Neg Hx        ADVANCED DIRECTIVES:    HEALTH MAINTENANCE: Social History  Substance  Use Topics  . Smoking status: Former Smoker    Types: Pipe  . Smokeless tobacco: Never Used     Comment: i4/06/2013 "only smoked pipe n college 1958"  . Alcohol use No     Colonoscopy:  PAP:  Bone density:  Lipid panel:  Allergies  Allergen Reactions  . Aspirin   . Chicken Allergy     unknown    Current Outpatient Prescriptions  Medication Sig Dispense Refill  . clonazePAM (KLONOPIN) 0.5 MG tablet take 1/2 tablet by mouth at bedtime 45 tablet 1  . donepezil (ARICEPT) 10 MG tablet take 1 tablet by mouth once daily 30 tablet 5  . hydrOXYzine (ATARAX/VISTARIL) 10 MG tablet Take 10 mg Hydroxacen at night to suppress the skin scratching 90 tablet 0  . polyethylene glycol (MIRALAX / GLYCOLAX) packet Take 17 g by mouth daily. 14 each 0  . triamterene-hydrochlorothiazide (MAXZIDE-25) 37.5-25 MG tablet take 1/2 tablet by mouth once daily 45 tablet 1   No current facility-administered medications for this visit.     OBJECTIVE: Vitals:   05/11/16 1352  BP: 120/80  Pulse: 71  Resp: 18  Temp: 98.6 F (37 C)     Body mass index is 33.37 kg/m.    ECOG FS:0 - Asymptomatic  General: Well-developed, well-nourished, no acute distress. Eyes: Pink conjunctiva, anicteric sclera. Lungs: Clear to auscultation bilaterally. Heart: Regular rate and rhythm. No rubs, murmurs, or gallops. Abdomen: Soft, nontender, nondistended. No organomegaly noted, normoactive bowel sounds. Musculoskeletal: No edema, cyanosis, or clubbing. Neuro: Alert, answering all questions appropriately. Cranial nerves grossly intact. Skin: No rashes or petechiae noted. Psych: Normal affect.   LAB RESULTS:  Lab Results  Component Value Date   NA 140 04/25/2016   K 3.1 (L) 04/25/2016   CL 107 04/25/2016   CO2 26 04/25/2016   GLUCOSE 103 (H) 04/25/2016   BUN 19 04/25/2016   CREATININE 0.90 04/25/2016   CALCIUM 8.1 (L) 04/25/2016   PROT 7.2 04/24/2016   ALBUMIN 3.8 04/24/2016   AST 19 04/24/2016   ALT 18  04/24/2016   ALKPHOS 50 04/24/2016   BILITOT 0.6 04/24/2016   GFRNONAA >60 04/25/2016   GFRAA >60 04/25/2016    Lab Results  Component Value Date   WBC 2.7 (L) 05/06/2016   NEUTROABS 1.1 (L) 05/06/2016   HGB 12.7 (L) 05/06/2016   HCT 36.1 (L) 05/06/2016   MCV 102.0 (H) 05/06/2016   PLT 103 (L) 05/06/2016   Lab Results  Component Value Date   TOTALPROTELP 7.2 05/06/2016   ALBUMINELP 3.7 05/06/2016   A1GS 0.2 05/06/2016   A2GS 0.6 05/06/2016   BETS 2.1 (H) 05/06/2016   GAMS 0.6 05/06/2016   MSPIKE 1.5 (H) 05/06/2016   SPEI Comment 05/06/2016     STUDIES: Ct Abdomen Pelvis W Contrast  Result Date: 04/24/2016 CLINICAL DATA:  Generalized abdominal pain.  Rectal bleeding. EXAM: CT ABDOMEN AND PELVIS WITH CONTRAST TECHNIQUE: Multidetector CT imaging of the abdomen and pelvis was performed using the standard protocol following bolus administration of intravenous contrast. CONTRAST:  168m ISOVUE-300 IOPAMIDOL (ISOVUE-300) INJECTION 61% COMPARISON:  09/30/2015 CT abdomen/pelvis. FINDINGS: Lower chest: No significant pulmonary nodules or acute consolidative airspace disease. Coronary atherosclerosis. Hepatobiliary: Normal liver with no liver mass. Cholecystectomy. No intrahepatic biliary ductal dilatation. Common bile duct diameter 9 mm, within expected post cholecystectomy limits. No radiopaque choledocholithiasis. Pancreas: Cystic 2.3 cm pancreatic body mass (series 2/image 18), increased in size from 1.6 cm on 09/30/2015. No additional pancreatic masses. Stable mildly dilated ventral pancreatic duct in the pancreatic head (4 mm diameter). Spleen: Normal size. No mass. Adrenals/Urinary Tract: Normal adrenals. Subcentimeter hypodense renal cortical lesion in the lateral interpolar right kidney, too small to characterize, unchanged, most consistent with a benign renal cyst. Otherwise normal kidneys, with no hydronephrosis. Bladder visualization is limited by streak artifact from the left hip  hardware, with no gross bladder abnormality. Stomach/Bowel: Small to moderate hiatal hernia. Otherwise grossly normal stomach. Large ventral abdominal wall midline hernia containing numerous small bowel loops has increased in size. No small bowel dilatation, pneumatosis or wall thickening. Status post subtotal right hemicolectomy with intact appearing ileocolic anastomosis in the right upper abdomen. Mild sigmoid diverticulosis. There is nonspecific circumferential wall thickening and mucosal hyperenhancement throughout the rectum with associated faint perirectal fat haziness. Vascular/Lymphatic: Atherosclerotic nonaneurysmal abdominal aorta. Patent portal, splenic, hepatic and renal veins. 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 No pathologically enlarged lymph nodes in the abdomen or pelvis. Reproductive: Moderately enlarged prostate, unchanged. Other: No pneumoperitoneum, ascites or focal fluid collection. Musculoskeletal: No aggressive appearing focal osseous lesions. Left total hip arthroplasty. Marked thoracolumbar spondylosis. IMPRESSION: 1. Nonspecific circumferential wall thickening and mucosal hyperenhancement throughout the rectum with associated faint perirectal fat haziness, most suggestive of an infectious or inflammatory proctitis, although a rectal neoplasm can have a similar appearance. Clinical correlation is necessary. Consider endoscopic evaluation as clinically warranted. 2. Interval enlargement of large midline ventral abdominal wall hernia containing small bowel loops. No findings to suggest small bowel obstruction or ischemia. 3. Interval growth of 2.3 cm cystic pancreatic body mass. Stable mild dilatation of the ventral pancreatic duct. MRI of the abdomen without and with IV contrast is recommended on a short term outpatient basis for further characterization, as an enlarging cystic pancreatic neoplasm cannot be excluded. 4. Additional findings include aortic atherosclerosis,  coronary atherosclerosis, small to moderate hiatal hernia and moderate prostatomegaly. Electronically Signed   By: JIlona SorrelM.D.   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  PLAN:    1.  MGUS: Patient's M spike remains essentially unchanged at 1.5.  Given the stability of his lab work and the fact that he is asymptomatic, a bone marrow biopsy is not necessary at this time. Previously, metastatic bone survey and CT scan were negative.  Because he has a suppressed IgA and IgM, it is possible that this is smoldering IgG multiple myeloma rather than MGUS. Smoldering multiple myeloma does not require treatment.  If he shows evidence of further endorgan damage would then proceed with a bone marrow biopsy and consideration of treatment.  Return to clinic in 3 months for cbc and then in 6 months for repeat laboratory work and further evaluation.   2.  Thrombocytopenia: Patient's platelet count appears proximally his baseline, monitor.  3.  Leukopenia: Stable and unchanged, monitor. 4.  Stage IIa adenocarcinoma of the cecum: Patient is stage IIa with no high risk features, therefore adjuvant chemotherapy was not necessary.  CEA continues  to be within normal limits.   5.  Lewy Body Dementia:  Continue follow up with neurologist.  6.  Pancreas lesion: CT scan results reviewed independently and reported as above with increasing size of lesion. Patient reports he has an MRI later this week to further evaluate. Continue monitoring and diagnostic planning per GI.  Patient expressed understanding and was in agreement with this plan. He also understands that He can call clinic at any time with any questions, concerns, or complaints.   Lloyd Huger, MD   05/12/2016 11:03 PM

## 2016-05-11 ENCOUNTER — Inpatient Hospital Stay (HOSPITAL_BASED_OUTPATIENT_CLINIC_OR_DEPARTMENT_OTHER): Payer: Medicare Other | Admitting: Oncology

## 2016-05-11 ENCOUNTER — Inpatient Hospital Stay: Payer: Medicare Other

## 2016-05-11 ENCOUNTER — Other Ambulatory Visit: Payer: Self-pay | Admitting: *Deleted

## 2016-05-11 VITALS — BP 120/80 | HR 71 | Temp 98.6°F | Resp 18 | Wt 219.5 lb

## 2016-05-11 DIAGNOSIS — R5383 Other fatigue: Secondary | ICD-10-CM

## 2016-05-11 DIAGNOSIS — C180 Malignant neoplasm of cecum: Secondary | ICD-10-CM | POA: Diagnosis not present

## 2016-05-11 DIAGNOSIS — D72819 Decreased white blood cell count, unspecified: Secondary | ICD-10-CM

## 2016-05-11 DIAGNOSIS — G4730 Sleep apnea, unspecified: Secondary | ICD-10-CM

## 2016-05-11 DIAGNOSIS — G3183 Dementia with Lewy bodies: Secondary | ICD-10-CM | POA: Diagnosis not present

## 2016-05-11 DIAGNOSIS — D472 Monoclonal gammopathy: Secondary | ICD-10-CM

## 2016-05-11 DIAGNOSIS — Z85828 Personal history of other malignant neoplasm of skin: Secondary | ICD-10-CM

## 2016-05-11 DIAGNOSIS — D696 Thrombocytopenia, unspecified: Secondary | ICD-10-CM

## 2016-05-11 DIAGNOSIS — R531 Weakness: Secondary | ICD-10-CM

## 2016-05-11 DIAGNOSIS — F419 Anxiety disorder, unspecified: Secondary | ICD-10-CM

## 2016-05-11 DIAGNOSIS — Z8719 Personal history of other diseases of the digestive system: Secondary | ICD-10-CM

## 2016-05-11 DIAGNOSIS — Z800 Family history of malignant neoplasm of digestive organs: Secondary | ICD-10-CM

## 2016-05-11 DIAGNOSIS — K219 Gastro-esophageal reflux disease without esophagitis: Secondary | ICD-10-CM

## 2016-05-11 DIAGNOSIS — F3341 Major depressive disorder, recurrent, in partial remission: Secondary | ICD-10-CM

## 2016-05-11 DIAGNOSIS — K869 Disease of pancreas, unspecified: Secondary | ICD-10-CM

## 2016-05-11 DIAGNOSIS — F0280 Dementia in other diseases classified elsewhere without behavioral disturbance: Secondary | ICD-10-CM

## 2016-05-11 DIAGNOSIS — Z87891 Personal history of nicotine dependence: Secondary | ICD-10-CM

## 2016-05-11 DIAGNOSIS — Z87442 Personal history of urinary calculi: Secondary | ICD-10-CM

## 2016-05-11 DIAGNOSIS — I10 Essential (primary) hypertension: Secondary | ICD-10-CM

## 2016-05-11 DIAGNOSIS — Z79899 Other long term (current) drug therapy: Secondary | ICD-10-CM

## 2016-05-11 DIAGNOSIS — Z86010 Personal history of colonic polyps: Secondary | ICD-10-CM

## 2016-05-11 NOTE — Progress Notes (Signed)
States is having difficulty moving bowels. Recently in hospital for obstruction. Has occasional hard stools but is able to move bowels daily. Had abnormal CT scan of abdomen and is scheduled for follow up MRI scan.

## 2016-05-13 ENCOUNTER — Other Ambulatory Visit: Payer: Self-pay | Admitting: Adult Health

## 2016-05-13 LAB — PROTEIN ELECTRO, RANDOM URINE
ALPHA-1-GLOBULIN, U: 4.1 %
ALPHA-2-GLOBULIN, U: 10.6 %
Albumin ELP, Urine: 33.1 %
Beta Globulin, U: 20.9 %
Gamma Globulin, U: 31.2 %
M Component, Ur: 16.1 % — ABNORMAL HIGH
TOTAL PROTEIN, URINE-UPE24: 27.6 mg/dL

## 2016-05-14 ENCOUNTER — Ambulatory Visit
Admission: RE | Admit: 2016-05-14 | Discharge: 2016-05-14 | Disposition: A | Discharge status: Home / Self Care | Payer: Medicare Other | Source: Ambulatory Visit | Attending: Family Medicine | Admitting: Family Medicine

## 2016-05-14 DIAGNOSIS — K8689 Other specified diseases of pancreas: Secondary | ICD-10-CM

## 2016-05-14 DIAGNOSIS — Z9049 Acquired absence of other specified parts of digestive tract: Secondary | ICD-10-CM | POA: Diagnosis not present

## 2016-05-14 DIAGNOSIS — K869 Disease of pancreas, unspecified: Secondary | ICD-10-CM | POA: Insufficient documentation

## 2016-05-14 DIAGNOSIS — R935 Abnormal findings on diagnostic imaging of other abdominal regions, including retroperitoneum: Secondary | ICD-10-CM | POA: Diagnosis present

## 2016-05-14 MED ORDER — GADOBENATE DIMEGLUMINE 529 MG/ML IV SOLN
20.0000 mL | Freq: Once | INTRAVENOUS | Status: AC | PRN
  Administered 2016-05-14: 20 mL via INTRAVENOUS

## 2016-06-04 ENCOUNTER — Encounter: Payer: Self-pay | Admitting: Adult Health

## 2016-06-04 ENCOUNTER — Ambulatory Visit (INDEPENDENT_AMBULATORY_CARE_PROVIDER_SITE_OTHER): Payer: Medicare Other | Admitting: Adult Health

## 2016-06-04 VITALS — BP 125/81 | HR 63 | Ht 68.0 in | Wt 210.2 lb

## 2016-06-04 DIAGNOSIS — F0280 Dementia in other diseases classified elsewhere without behavioral disturbance: Secondary | ICD-10-CM

## 2016-06-04 DIAGNOSIS — G3183 Dementia with Lewy bodies: Secondary | ICD-10-CM

## 2016-06-04 NOTE — Progress Notes (Signed)
PATIENT: John Summers DOB: Feb 13, 1933  REASON FOR VISIT: follow up- Lewy body dementia HISTORY FROM: patient  HISTORY OF PRESENT ILLNESS: Today 06/04/16: John Summers is an 82 year old male with a history of Lewy body dementia. He returns today for follow-up. He is currently on Aricept and tolerating it well. At the last visit Vistaril was started for itching. He reports that has been beneficial but makes him very sleepy. He lives at home with his wife. He is able to complete ADLs with minimal assistance. Wife reports that he stumbles but has had no falls. He refuses to use as a a walker. He continues to have hallucinations. For the most part they are not fearful and are nonviolent. He states there are usually about 15 people in a room with him. He states they do not talk to him. All other times he sees children. He continues to use Klonopin at night. He states that this makes him sleep very well. The patient and his wife are resistant to antipsychotics due to black box warning. He returns today for an evaluation.  HISTORY 03/04/16:  John Summers is seen 03-04-2016  in the presence of his wife, he is in physical therapy for his parkinsonian appearance and symptoms. His movements have slowed. He still and perhaps even increasing he has visual hallucinations but no auditory component results. People have moved into his home after the election and have not left. They're not talking and there are silently residing.  His movements have slowed he is slightly hunched and he is more rigid. He has increasingly frequently fallen. It is difficult for him to rise from a seated position and all this has been addressed with physical therapy in the hope of stalling with disease progression. He has started to fall backwards. His stance is not as steady and he seems to sway. His head this often dropped to his chest and he is looking at the floor while he walks. He cannot dress steadily, he is so extremely slow. His  dysphonia is unchanged. Reduced facial expression, "dropped "open mouth. Appetite has remained strong, sleepiness is stronger but his wife is also reported that he seems to gurgle, and drool. He is also diaphoretic at night. His sleep cannot be evaluated in the lab, he has pseudobulbar affect, gets suddently tearful. PBA. He has no sign of PSP.     (MM) John Summers is an 82 year old male with a history of Lewy body dementia. He returns today for follow-up. The patient remains on Aricept 10 mg daily. He also takes Klonopin half a tablet at bedtime. The patient feels that his memory has gotten slightly worse. He requires assistance with ADLs such as dressing. He does not operate a motor vehicle. The patient does have trouble with his balance and is working with physical therapy currently. He does not use an assistive device when ambulating. The patient does have some anxiety. When he becomes frustrated his tremor worsens and he will need to take deep breaths. These episodes only last for several seconds.  The patient continues to have hallucinations. His hallucinations are not violent or fearful. He states that he will see people in his home. He states it looks as if they're having conversation but he cannot hear them. He continues to take Klonopin at bedtime. His wife states that he sleeps very well at bedtime and patient agrees. Patient denies any changes with his appetite. He denies any new neurological symptoms. He returns today for an evaluation.  HISTORY  05/01/15: John Summers is an 82 year old male with a history of possible Lewy body dementia. He returns today for an evaluation. The patient is on Aricept 10 mg daily. He is tolerating this medication well. The patient also takes Klonopin 1/2 tablet at bedtime for REM sleep behavior disorder. At the last visit the patient was asked to start melatonin 5 mg in conjunction to the Klonopin. Wife reports that she could not remember the name of this  medication and therefore they did not start it. The patient feels that his memory has gotten worse. He is able to complete all ADLs independently. He reports he does have trouble dressing but this is due to his balance. The patient has had 2 falls since the last visit. The patient does not operate a motor vehicle. His wife now completes all the finances. He does report visual hallucinations. His hallucinations primarily consist of people. He reports that the hallucinations are not violent or fearful. They normally take place during the day rarely at night. The wife feels that his sleep is slightly better. He has been more calmer when he sleeps. The wife feels that he may be depressed. She states that he typically he has no motivation to do most things. She has encouraged him to complete basic needs such as bathing. Patient states that he does not want to start any medication for this.   REVIEW OF SYSTEMS: Out of a complete 14 system review of symptoms, the patient complains only of the following symptoms, and all other reviewed systems are negative.  See history of present illness  ALLERGIES: Allergies  Allergen Reactions  . Aspirin   . Chicken Allergy     unknown    HOME MEDICATIONS: Outpatient Medications Prior to Visit  Medication Sig Dispense Refill  . clonazePAM (KLONOPIN) 0.5 MG tablet take 1/2 tablet by mouth at bedtime 45 tablet 1  . donepezil (ARICEPT) 10 MG tablet take 1 tablet by mouth once daily 30 tablet 5  . hydrOXYzine (ATARAX/VISTARIL) 10 MG tablet Take 10 mg Hydroxacen at night to suppress the skin scratching 90 tablet 0  . polyethylene glycol (MIRALAX / GLYCOLAX) packet Take 17 g by mouth daily. 14 each 0  . triamterene-hydrochlorothiazide (MAXZIDE-25) 37.5-25 MG tablet take 1/2 tablet by mouth once daily 45 tablet 1   No facility-administered medications prior to visit.     PAST MEDICAL HISTORY: Past Medical History:  Diagnosis Date  . Colon adenocarcinoma ([place])   .  Complication of anesthesia    severe confusion  . GERD (gastroesophageal reflux disease)   . History of colon polyps   . History of gallstones   . Hx of transfusion of platelets   . Hypertension   . ITP (idiopathic thrombocytopenic purpura)    Dr. Grayland Ormond follows  . Kidney stones   . Lewy body dementia with behavioral disturbance   . Major depressive disorder, recurrent episode, in partial remission with anxious distress ([place])   . Pancreatitis 2/11  . Pollen allergies   . Skin cancer    ears  . Sleep apnea    "won't wear mask anymore" (12/22/2012)  . Unspecified essential hypertension     PAST SURGICAL HISTORY: Past Surgical History:  Procedure Laterality Date  . CARDIAC CATHETERIZATION  2000's   "tx'd w/acid reflux RX" (12/22/2012)  . CHOLECYSTECTOMY  2011  . ESOPHAGEAL DILATION  1990's?  Marland Kitchen HERNIA REPAIR  12/22/2012   IHR w/mesh  . INGUINAL HERNIA REPAIR Left [id number]  . INSERTION OF MESH N/A  12/22/2012   Procedure: INSERTION OF MESH;  Surgeon: Harl Bowie, MD;  Location: [facility];  Service: General;  Laterality: N/A;  . LITHOTRIPSY  2008   x2  . PARTIAL COLECTOMY  03/21/2012   Procedure: PARTIAL COLECTOMY;  Surgeon: Harl Bowie, MD;  Location: [facility];  Service: General;  Laterality: Right;  . PILONIDAL CYST EXCISION  1960's?  Marland Kitchen SKIN CANCER EXCISION     "face; ?left ear" (12/22/2012)  . TONSILLECTOMY AND ADENOIDECTOMY  1946  . TOTAL HIP ARTHROPLASTY Left ~ 2002  . UPPER GASTROINTESTINAL ENDOSCOPY    . VENTRAL HERNIA REPAIR N/A 12/22/2012   Procedure: HERNIA REPAIR INCISIONAL ;  Surgeon: Harl Bowie, MD;  Location: [facility];  Service: General;  Laterality: N/A;    FAMILY HISTORY: Family History  Problem Relation Age of Onset  . Colon cancer Mother   . Heart disease Mother   . Hypertension Mother   . Alcohol abuse Father   . Esophageal cancer Neg Hx   . Rectal cancer Neg Hx   . Stomach cancer Neg Hx     SOCIAL HISTORY: Social History   Social  History  . Marital status: Married    Spouse name: Charlett Nose  . Number of children: 2  . Years of education: college   Occupational History  .  Retired    Retired, Production manager, Investment banker, corporate   Social History Main Topics  . Smoking status: Former Smoker    Types: Pipe  . Smokeless tobacco: Never Used     Comment: i4/06/2013 "only smoked pipe n college 1958"  . Alcohol use No  . Drug use: No  . Sexual activity: No   Other Topics Concern  . Not on file   Social History Narrative   Patient is married Charlett Nose) and lives with his wife.   Patient has two children and his wife has two children..   Patient is retired.   Patient has a college education.   Patient is right-handed.   Patient drinks 1-2 cups of coffee daily.      PHYSICAL EXAM  Vitals:   06/04/16 1046  BP: 125/81  Pulse: 63  Weight: 210 lb 3.2 oz (95.3 kg)  Height: 5\' 8"  (1.727 m)   Body mass index is 31.96 kg/m.   MMSE - Mini Mental State Exam 06/04/2016 03/04/2016 11/05/2015  Orientation to time 1 3 2   Orientation to Place 2 3 2   Registration 3 3 3   Attention/ Calculation 0 1 0  Recall 0 1 0  Language- name 2 objects 2 2 2   Language- repeat 1 0 1  Language- follow 3 step command 3 3 2   Language- read & follow direction 1 1 1   Write a sentence 1 1 0  Copy design 1 0 1  Total score 15 18 14     Generalized: Well developed, in no acute distress   Neurological examination  Mentation: Alert Follows all commands speech and language fluent. Masking of face Cranial nerve II-XII: Pupils were equal round reactive to light. Extraocular movements were full, visual field were full on confrontational test. Facial sensation and strength were normal. Uvula tongue midline. Head turning and shoulder shrug  were normal and symmetric. Motor: The motor testing reveals 5 over 5 strength of all 4 extremities. Good symmetric motor tone is noted throughout.  Sensory: Sensory testing is intact to soft touch on all 4  extremities. No evidence of extinction is noted.  Coordination: Cerebellar testing reveals good finger-nose-finger  and heel-to-shin bilaterally.  Gait and station: Patient's gait is unsteady. He has a shuffling type gait. Decreased arm swing. Tandem gait not attended. Reflexes: Deep tendon reflexes are symmetric and normal bilaterally.   DIAGNOSTIC DATA (LABS, IMAGING, TESTING) - I reviewed patient records, labs, notes, testing and imaging myself where available.  Lab Results  Component Value Date   WBC 2.7 (L) 05/06/2016   HGB 12.7 (L) 05/06/2016   HCT 36.1 (L) 05/06/2016   MCV 102.0 (H) 05/06/2016   PLT 103 (L) 05/06/2016      Component Value Date/Time   NA 140 04/25/2016 0426   NA 144 10/10/2014 1051   K 3.1 (L) 04/25/2016 0426   K 3.9 10/10/2014 1051   CL 107 04/25/2016 0426   CL 106 10/10/2014 1051   CO2 26 04/25/2016 0426   CO2 30 10/10/2014 1051   GLUCOSE 103 (H) 04/25/2016 0426   GLUCOSE 133 (H) 10/10/2014 1051   BUN 19 04/25/2016 0426   BUN 23 (H) 10/10/2014 1051   CREATININE 0.90 04/25/2016 0426   CREATININE 1.17 10/10/2014 1051   CALCIUM 8.1 (L) 04/25/2016 0426   CALCIUM 8.4 (L) 10/10/2014 1051   PROT 7.2 04/24/2016 1617   ALBUMIN 3.8 04/24/2016 1617   AST 19 04/24/2016 1617   ALT 18 04/24/2016 1617   ALKPHOS 50 04/24/2016 1617   BILITOT 0.6 04/24/2016 1617   GFRNONAA >60 04/25/2016 0426   GFRNONAA >60 10/10/2014 1051   GFRNONAA 59 (L) 04/02/2014 1045   GFRAA >60 04/25/2016 0426   GFRAA >60 10/10/2014 1051   GFRAA >60 04/02/2014 1045   Lab Results  Component Value Date   CHOL 140 09/30/2015   HDL 38.70 (L) 09/30/2015   LDLCALC 87 09/30/2015   TRIG 74.0 09/30/2015   CHOLHDL 4 09/30/2015    Lab Results  Component Value Date   VITAMINB12 200 04/25/2016      ASSESSMENT AND PLAN 82 y.o. year old male  has a past medical history of Colon adenocarcinoma ([place]); Complication of anesthesia; GERD (gastroesophageal reflux disease); History of colon  polyps; History of gallstones; transfusion of platelets; Hypertension; ITP (idiopathic thrombocytopenic purpura); Kidney stones; Lewy body dementia with behavioral disturbance; Major depressive disorder, recurrent episode, in partial remission with anxious distress ([place]); Pancreatitis (2/11); Pollen allergies; Skin cancer; Sleep apnea; and Unspecified essential hypertension. here with:  1. Lewy body dementia  [name] memory score has continued to decline. I have suggested Namenda however the wife and patient are unsure that he wants to start this. For now he will continue on Aricept 10 mg at bedtime. Advised that if his symptoms worsen or he develops any new symptoms that he'll let us know. Follow-up in 6 months or sooner if needed.     Ward Givens, MSN, NP-C 06/04/2016, 11:15 AM [facility] [address], [place], [place] [address] [phone]

## 2016-06-04 NOTE — Patient Instructions (Addendum)
Continue Aricept  Consider Namenda Continue Klonopin  If your symptoms worsen or you develop new symptoms please let us know.   Memantine Tablets What is this medicine? MEMANTINE (MEM an teen) is used to treat dementia caused by Alzheimer's disease. This medicine may be used for other purposes; ask your health care provider or pharmacist if you have questions. What should I tell my health care provider before I take this medicine? They need to know if you have any of these conditions: -difficulty passing urine -kidney disease -liver disease -seizures -an unusual or allergic reaction to memantine, other medicines, foods, dyes, or preservatives -pregnant or trying to get pregnant -breast-feeding How should I use this medicine? Take this medicine by mouth with a glass of water. Follow the directions on the prescription label. You may take this medicine with or without food. Take your doses at regular intervals. Do not take your medicine more often than directed. Continue to take your medicine even if you feel better. Do not stop taking except on the advice of your doctor or health care professional. Talk to your pediatrician regarding the use of this medicine in children. Special care may be needed. Overdosage: If you think you have taken too much of this medicine contact a poison control center or emergency room at once. NOTE: This medicine is only for you. Do not share this medicine with others. What if I miss a dose? If you miss a dose, take it as soon as you can. If it is almost time for your next dose, take only that dose. Do not take double or extra doses. If you do not take your medicine for several days, contact your health care provider. Your dose may need to be changed. What may interact with this medicine? -acetazolamide -amantadine -cimetidine -dextromethorphan -dofetilide -hydrochlorothiazide -ketamine -metformin -methazolamide -quinidine -ranitidine -sodium  bicarbonate -triamterene This list may not describe all possible interactions. Give your health care provider a list of all the medicines, herbs, non-prescription drugs, or dietary supplements you use. Also tell them if you smoke, drink alcohol, or use illegal drugs. Some items may interact with your medicine. What should I watch for while using this medicine? Visit your doctor or health care professional for regular checks on your progress. Check with your doctor or health care professional if there is no improvement in your symptoms or if they get worse. You may get drowsy or dizzy. Do not drive, use machinery, or do anything that needs mental alertness until you know how this drug affects you. Do not stand or sit up quickly, especially if you are an older patient. This reduces the risk of dizzy or fainting spells. Alcohol can make you more drowsy and dizzy. Avoid alcoholic drinks. What side effects may I notice from receiving this medicine? Side effects that you should report to your doctor or health care professional as soon as possible: -allergic reactions like skin rash, itching or hives, swelling of the face, lips, or tongue -agitation or a feeling of restlessness -depressed mood -dizziness -hallucinations -redness, blistering, peeling or loosening of the skin, including inside the mouth -seizures -vomiting Side effects that usually do not require medical attention (report to your doctor or health care professional if they continue or are bothersome): -constipation -diarrhea -headache -nausea -trouble sleeping This list may not describe all possible side effects. Call your doctor for medical advice about side effects. You may report side effects to FDA at 1-800-FDA-1088. Where should I keep my medicine? Keep out of the  reach of children. Store at room temperature between 15 degrees and 30 degrees C (59 degrees and 86 degrees F). Throw away any unused medicine after the expiration  date. NOTE: This sheet is a summary. It may not cover all possible information. If you have questions about this medicine, talk to your doctor, pharmacist, or health care provider.    2016, Elsevier/Gold Standard. (2013-06-19 14:10:42)

## 2016-06-04 NOTE — Progress Notes (Signed)
I agree with the assessment and plan as directed by NP .The patient is known to me .   Markcus Lazenby, MD  

## 2016-06-08 ENCOUNTER — Other Ambulatory Visit: Payer: Self-pay | Admitting: Neurology

## 2016-06-08 NOTE — Telephone Encounter (Signed)
RX for [employer] faxed to [facility] in [place]. Received a receipt of confirmation.

## 2016-07-13 ENCOUNTER — Ambulatory Visit: Payer: Medicare Other | Admitting: Gastroenterology

## 2016-08-11 ENCOUNTER — Inpatient Hospital Stay: Payer: Medicare Other

## 2016-09-03 ENCOUNTER — Other Ambulatory Visit: Payer: Self-pay | Admitting: Neurology

## 2016-09-09 ENCOUNTER — Telehealth: Payer: Self-pay

## 2016-09-09 NOTE — Telephone Encounter (Signed)
hydrOXYzine (ATARAX/VISTARIL) 10 MG tablet pt daughter Alyse Low is requesting a ref[facility] [place] .  Best phone number to reach pt [phone]

## 2016-09-10 NOTE — Telephone Encounter (Signed)
Rx sent in yesterday

## 2016-09-18 ENCOUNTER — Telehealth: Payer: Self-pay

## 2016-09-18 NOTE — Telephone Encounter (Signed)
-----   Message from Milus Banister, MD sent at 09/18/2016  7:21 AM EST ----- I think the Aug MRI is adequate.  The panc cyst is slightly bigger but no worrisome solid components, enhancement or irregular main duct.  Given his co-morbidities (esp dementia) no need for further follow up.  Thanks  dj  ----- Message ----- From: Marlon Pel, RN Sent: 09/17/2016   2:50 PM To: Milus Banister, MD  Dr. Ardis Hughs,  Patty had this in her box to set up an MRI.  The patient recently had one in August with a one year follow up recommended.  August MRI was ordered by Dr. Edilia Bo.  Is this adequate?  I can't find the original recommendation or indications from you.    Jaquisha Frech ----- Message ----- From: Barron Alvine, RN Sent: 09/14/2016 To: Barron Alvine, RN  MRI needed 09/2016

## 2016-10-09 ENCOUNTER — Ambulatory Visit: Payer: Medicare Other | Admitting: Gastroenterology

## 2016-10-14 ENCOUNTER — Telehealth: Payer: Self-pay | Admitting: Neurology

## 2016-10-14 NOTE — Telephone Encounter (Signed)
Pt is already taking klonopin 0.25mg  qhs, which pt reported makes him sleepy. Any other recommendations?

## 2016-10-14 NOTE — Telephone Encounter (Signed)
Patients wife called in reference to change in patient.  Patient has been waking up in the middle of the night being very restless and sweating a lot.  During the day patient has been very emotional with crying out.  Patient has fallen 2-3 times recently along with shuffling his feet.  Patients wife if requesting if he is able to get something to help calm him down, but not make him a zombie.  Pharmacy- [facility] [address].  Please call

## 2016-10-15 NOTE — Telephone Encounter (Signed)
I called pt's wife. She is agreeable to trying a low dose of xanax for this pt for a couple weeks and seeing if it helps. Pt's wife understands that xanax cannot be taken with the klonopin.  Will send to Dr. Brett Fairy for review.

## 2016-10-15 NOTE — Telephone Encounter (Addendum)
  We could try very low dose Xanax instead of Klonipin, can't be taken together.

## 2016-10-16 MED ORDER — ALPRAZOLAM 0.5 MG PO TABS: 0.5000 mg | ORAL_TABLET | Freq: Every evening | ORAL | 0 refills | Status: DC | PRN

## 2016-10-16 NOTE — Addendum Note (Signed)
Addended by: Larey Seat on: 10/16/2016 10:00 AM   Modules accepted: Orders

## 2016-10-16 NOTE — Telephone Encounter (Signed)
Prescription for xanax (printed out on regular paper).  I called and spoke to [place], at [facility] [phone] and gave her the verbal order for xanax 0.5mg  tabs ( take one tablet orally at bedtime prn for anxiety).  #30, no refills.  She read back to me for clarification.

## 2016-10-19 ENCOUNTER — Encounter: Payer: Medicare Other | Admitting: Family Medicine

## 2016-10-19 NOTE — Telephone Encounter (Signed)
I received notification from pt's pharmacy that pa is needed for xanax. However, it is only around $7.00 for a 30 day supply.  I called pt's wife. They have already picked up the xanax and do not need a pa completed for this medication. Pt's wife will let us know how he does using the xanax.

## 2016-10-29 ENCOUNTER — Other Ambulatory Visit: Payer: Self-pay | Admitting: Family Medicine

## 2016-11-08 ENCOUNTER — Other Ambulatory Visit: Payer: Self-pay | Admitting: Neurology

## 2016-11-09 ENCOUNTER — Ambulatory Visit (INDEPENDENT_AMBULATORY_CARE_PROVIDER_SITE_OTHER): Payer: Medicare Other | Admitting: Family Medicine

## 2016-11-09 ENCOUNTER — Encounter: Payer: Self-pay | Admitting: Family Medicine

## 2016-11-09 VITALS — BP 100/70 | HR 78 | Temp 98.2°F | Wt 210.0 lb

## 2016-11-09 DIAGNOSIS — Z0000 Encounter for general adult medical examination without abnormal findings: Secondary | ICD-10-CM

## 2016-11-09 DIAGNOSIS — Z79899 Other long term (current) drug therapy: Secondary | ICD-10-CM | POA: Diagnosis not present

## 2016-11-09 DIAGNOSIS — R5383 Other fatigue: Secondary | ICD-10-CM

## 2016-11-09 DIAGNOSIS — Z23 Encounter for immunization: Secondary | ICD-10-CM | POA: Diagnosis not present

## 2016-11-09 LAB — CBC WITH DIFFERENTIAL/PLATELET
BASOS ABS: 0 10*3/uL (ref 0.0–0.1)
Basophils Relative: 0.2 % (ref 0.0–3.0)
EOS ABS: 0 10*3/uL (ref 0.0–0.7)
Eosinophils Relative: 0.4 % (ref 0.0–5.0)
HCT: 36.9 % — ABNORMAL LOW (ref 39.0–52.0)
Hemoglobin: 12.7 g/dL — ABNORMAL LOW (ref 13.0–17.0)
LYMPHS ABS: 1.1 10*3/uL (ref 0.7–4.0)
Lymphocytes Relative: 36.4 % (ref 12.0–46.0)
MCHC: 34.5 g/dL (ref 30.0–36.0)
MONOS PCT: 15.2 % — AB (ref 3.0–12.0)
Monocytes Absolute: 0.5 10*3/uL (ref 0.1–1.0)
NEUTROS ABS: 1.5 10*3/uL (ref 1.4–7.7)
NEUTROS PCT: 47.8 % (ref 43.0–77.0)
PLATELETS: 106 10*3/uL — AB (ref 150.0–400.0)
RBC: 3.49 Mil/uL — ABNORMAL LOW (ref 4.22–5.81)
RDW: 13.7 % (ref 11.5–15.5)
WBC: 3.1 10*3/uL — ABNORMAL LOW (ref 4.0–10.5)

## 2016-11-09 LAB — BASIC METABOLIC PANEL
BUN: 21 mg/dL (ref 6–23)
CALCIUM: 9.1 mg/dL (ref 8.4–10.5)
CO2: 29 meq/L (ref 19–32)
CREATININE: 1.07 mg/dL (ref 0.40–1.50)
Chloride: 107 mEq/L (ref 96–112)
GFR: 70.31 mL/min (ref >60.00)
GLUCOSE: 89 mg/dL (ref 70–99)
Potassium: 3.9 mEq/L (ref 3.5–5.1)
Sodium: 144 mEq/L (ref 135–145)

## 2016-11-09 LAB — HEPATIC FUNCTION PANEL
ALBUMIN: 4 g/dL (ref 3.5–5.2)
ALK PHOS: 42 U/L (ref 39–117)
ALT: 13 U/L (ref 0–53)
AST: 16 U/L (ref 0–37)
Bilirubin, Direct: 0.2 mg/dL (ref 0.0–0.3)
Total Bilirubin: 0.5 mg/dL (ref 0.2–1.2)
Total Protein: 7.2 g/dL (ref 6.0–8.3)

## 2016-11-09 LAB — TSH: TSH: 1.51 u[IU]/mL (ref 0.35–4.50)

## 2016-11-09 MED ORDER — FLUOXETINE HCL 20 MG PO TABS: 20.0000 mg | ORAL_TABLET | Freq: Every day | ORAL | 5 refills | Status: DC

## 2016-11-09 NOTE — Progress Notes (Signed)
Dr. Frederico Hamman T. Dyllon Henken, MD, [facility] [place], [address] Phone: [phone] Fax: [phone]  11/09/2016  Patient: John Summers, MRN: [id number], DOB: [birth year], 83 y.o.  Primary Physician:  Owens Loffler, MD   Chief Complaint  Patient presents with  . Medicare Wellness   Subjective:   John Summers is a 83 y.o. pleasant patient who presents for a medicare wellness examination:  Preventative Health Maintenance Visit:  Health Maintenance Summary Reviewed and updated, unless pt declines services.  Tobacco History Reviewed. Alcohol: No concerns, no excessive use Exercise Habits: none STD concerns: no risk or activity to increase risk Drug Use: None  The patient Lewy body dementia continues to advance. He is accompanied by his wife who provides additional history.  He is seeing things in the home every day.  He is crying and upset every day.  He tells me that he has been feeling sad, down, and depressed for more than a year.  He understands what is happening to him at this point, and it is disheartening and quite distressing for him.  Health Maintenance  Topic Date Due  . TETANUS/TDAP  10/02/1953  . COLONOSCOPY  03/08/2013  . INFLUENZA VACCINE  Completed  . PNA vac Low Risk Adult  Completed    Immunization History  Administered Date(s) Administered  . Influenza Split 08/20/2011  . Influenza,inj,Quad PF,36+ Mos 06/21/2013, 05/03/2014, 08/01/2015, 11/09/2016  . Pneumococcal Conjugate-13 05/03/2014  . Pneumococcal Polysaccharide-23 11/11/2011, 02/21/2012    Patient Active Problem List   Diagnosis Date Noted  . Lewy body dementia with behavioral disturbance 11/16/2014    Priority: High  . Adenocarcinoma of cecum ([place]) 03/19/2012    Priority: High  . Sleep apnea     Priority: Medium  . Essential hypertension     Priority: Medium  . MGUS (monoclonal gammopathy of unknown significance)  05/10/2016  . Pancreatic mass 04/24/2016  . Thrombocytopenia ([place]) 10/04/2015  . Uncontrolled REM sleep behavior disorder 11/16/2014  . Tremor of right hand 11/16/2014  . Shuffling gait 11/16/2014  . Major depressive disorder, recurrent episode, in partial remission with anxious distress ([place])   . REM sleep behavior disorder 12/07/2013  . Incisional hernia, without obstruction or gangrene 11/14/2012  . GERD (gastroesophageal reflux disease) 03/19/2012   Past Medical History:  Diagnosis Date  . Colon adenocarcinoma ([place])   . Complication of anesthesia    severe confusion  . GERD (gastroesophageal reflux disease)   . History of colon polyps   . History of gallstones   . Hx of transfusion of platelets   . Hypertension   . ITP (idiopathic thrombocytopenic purpura)    Dr. Grayland Ormond follows  . Kidney stones   . Lewy body dementia with behavioral disturbance   . Major depressive disorder, recurrent episode, in partial remission with anxious distress ([place])   . Pancreatitis 2/11  . Pollen allergies   . Skin cancer    ears  . Sleep apnea    "won't wear mask anymore" (12/22/2012)  . Unspecified essential hypertension    Past Surgical History:  Procedure Laterality Date  . CARDIAC CATHETERIZATION  2000's   "tx'd w/acid reflux RX" (12/22/2012)  . CHOLECYSTECTOMY  2011  . ESOPHAGEAL DILATION  1990's?  Marland Kitchen HERNIA REPAIR  12/22/2012   IHR w/mesh  . INGUINAL HERNIA REPAIR Left [id number]  . INSERTION OF MESH N/A 12/22/2012   Procedure: INSERTION OF MESH;  Surgeon: Harl Bowie, MD;  Location:  [facility];  Service: General;  Laterality: N/A;  . LITHOTRIPSY  2008   x2  . PARTIAL COLECTOMY  03/21/2012   Procedure: PARTIAL COLECTOMY;  Surgeon: Harl Bowie, MD;  Location: [facility];  Service: General;  Laterality: Right;  . PILONIDAL CYST EXCISION  1960's?  Marland Kitchen SKIN CANCER EXCISION     "face; ?left ear" (12/22/2012)  . TONSILLECTOMY AND ADENOIDECTOMY  1946  . TOTAL HIP ARTHROPLASTY Left ~ 2002    . UPPER GASTROINTESTINAL ENDOSCOPY    . VENTRAL HERNIA REPAIR N/A 12/22/2012   Procedure: HERNIA REPAIR INCISIONAL ;  Surgeon: Harl Bowie, MD;  Location: [facility];  Service: General;  Laterality: N/A;   Social History   Social History  . Marital status: Married    Spouse name: Charlett Nose  . Number of children: 2  . Years of education: college   Occupational History  .  Retired    Retired, Production manager, Investment banker, corporate   Social History Main Topics  . Smoking status: Former Smoker    Types: Pipe  . Smokeless tobacco: Never Used     Comment: i4/06/2013 "only smoked pipe n college 1958"  . Alcohol use No  . Drug use: No  . Sexual activity: No   Other Topics Concern  . Not on file   Social History Narrative   Patient is married Charlett Nose) and lives with his wife.   Patient has two children and his wife has two children..   Patient is retired.   Patient has a college education.   Patient is right-handed.   Patient drinks 1-2 cups of coffee daily.   Family History  Problem Relation Age of Onset  . Colon cancer Mother   . Heart disease Mother   . Hypertension Mother   . Alcohol abuse Father   . Esophageal cancer Neg Hx   . Rectal cancer Neg Hx   . Stomach cancer Neg Hx    Allergies  Allergen Reactions  . Aspirin   . Chicken Allergy     unknown    Medication list has been reviewed and updated.   General: Denies fever, chills, sweats. No significant weight loss. Eyes: Denies blurring,significant itching ENT: Denies earache, sore throat, and hoarseness. Cardiovascular: Denies chest pains, palpitations, dyspnea on exertion Respiratory: Denies cough, dyspnea at rest,wheeezing Breast: no concerns about lumps GI: Denies nausea, vomiting, diarrhea, constipation, change in bowel habits, abdominal pain, melena, hematochezia GU: Denies penile discharge, + ED, no urinary flow / outflow problems. No STD concerns. Musculoskeletal: Denies back pain, joint pain Derm:  Denies rash, itching Neuro: MEMORY LOSS, TREMORS OF HANDS, VISUAL HALLUCINATIONS Psych: CRYING INTERMITTENTLY DURING EXAM Endocrine: Denies cold intolerance, heat intolerance, polydipsia Heme: Denies enlarged lymph nodes Allergy: No hayfever  Objective:   BP 100/70   Pulse 78   Temp 98.2 F (36.8 C) (Oral)   Wt 210 lb (95.3 kg)   BMI 31.93 kg/m   The patient completed a fall screen and PHQ-2 and PHQ-9 if necessary, which is documented in the EHR. The CMA/LPN/RN who assisted the patient verbally completed with them and documented results in [facility].   Hearing Screening   Method: Audiometry   '125Hz'  '250Hz'  '500Hz'  '1000Hz'  '2000Hz'  '3000Hz'  '4000Hz'  '6000Hz'  '8000Hz'   Right ear:   0 0 0  0    Left ear:   40 0 40  0    Vision Screening Comments: Wears Glasses  GEN: well developed, well nourished, no acute distress Eyes: conjunctiva and  lids normal, PERRLA, EOMI ENT: TM clear, nares clear, oral exam WNL Neck: supple, no lymphadenopathy, no thyromegaly, no JVD Pulm: clear to auscultation and percussion, respiratory effort normal CV: regular rate and rhythm, S1-S2, no murmur, rub or gallop, no bruits, peripheral pulses normal and symmetric, no cyanosis, clubbing, edema or varicosities GI: soft, non-tender; no hepatosplenomegaly, masses; active bowel sounds all quadrants GU: no hernia, testicular mass, penile discharge Lymph: no cervical, axillary or inguinal adenopathy MSK: mild-mod global back pain SKIN: clear, good turgor, color WNL, no rashes, lesions, or ulcerations Neuro: moderately demented.  Psych: Somewhat oriented - see scanned sheets. Crying.   All labs reviewed with patient.  Lipids:    Component Value Date/Time   CHOL 140 09/30/2015 1526   TRIG 74.0 09/30/2015 1526   HDL 38.70 (L) 09/30/2015 1526   VLDL 14.8 09/30/2015 1526   CHOLHDL 4 09/30/2015 1526   CBC: CBC Latest Ref Rng & Units 11/09/2016 05/06/2016 04/25/2016  WBC 4.0 - 10.5 K/uL 3.1(L) 2.7(L) 3.8    Hemoglobin 13.0 - 17.0 g/dL 12.7(L) 12.7(L) 11.6(L)  Hematocrit 39.0 - 52.0 % 36.9(L) 36.1(L) 31.9(L)  Platelets 150.0 - 400.0 K/uL 106.0(L) 103(L) 82(L)    Basic Metabolic Panel:    Component Value Date/Time   NA 144 11/09/2016 1302   NA 144 10/10/2014 1051   K 3.9 11/09/2016 1302   K 3.9 10/10/2014 1051   CL 107 11/09/2016 1302   CL 106 10/10/2014 1051   CO2 29 11/09/2016 1302   CO2 30 10/10/2014 1051   BUN 21 11/09/2016 1302   BUN 23 (H) 10/10/2014 1051   CREATININE 1.07 11/09/2016 1302   CREATININE 1.17 10/10/2014 1051   GLUCOSE 89 11/09/2016 1302   GLUCOSE 133 (H) 10/10/2014 1051   CALCIUM 9.1 11/09/2016 1302   CALCIUM 8.4 (L) 10/10/2014 1051   Hepatic Function Latest Ref Rng & Units 11/09/2016 04/24/2016 09/30/2015  Total Protein 6.0 - 8.3 g/dL 7.2 7.2 7.6  Albumin 3.5 - 5.2 g/dL 4.0 3.8 4.1  AST 0 - 37 U/L '16 19 16  ' ALT 0 - 53 U/L '13 18 17  ' Alk Phosphatase 39 - 117 U/L 42 50 49  Total Bilirubin 0.2 - 1.2 mg/dL 0.5 0.6 0.5  Bilirubin, Direct 0.0 - 0.3 mg/dL 0.2 - -    Lab Results  Component Value Date   TSH 1.51 11/09/2016    Assessment and Plan:   Healthcare maintenance  Need for prophylactic vaccination and inoculation against influenza - Plan: Flu Vaccine QUAD 36+ mos IM  Encounter for long-term current use of medication - Plan: Basic metabolic panel, CBC with Differential/Platelet, Hepatic function panel  Other fatigue - Plan: TSH   Primary issue in this gentleman is advancing Lewy-Body Dementia.  This is been progressing over time, and his hallucinations are quite irritated and bothersome.  He also appears on top of this to be quite depressed.  I discussed this with both patient and his wife, and I think it is reasonable to try him on an SSRI.  I'm going to start him on Prozac 20 mg a day. F/u 6 weeks.  Cc: Dr. Brett Fairy  Health Maintenance Exam: [name] preventative maintenance and recommended screening tests for an annual wellness exam were  reviewed in full today. Brought up to date unless services declined.  Counselled on the importance of diet, exercise, and its role in overall health and mortality. [name] FH and SH was reviewed, including their home life, tobacco status, and drug and alcohol  status.  Follow-up in 1 year for physical exam or additional follow-up below.  I have personally reviewed the Medicare Annual Wellness questionnaire and have noted 1. [name] medical and social history 2. Their use of alcohol, tobacco or illicit drugs 3. Their current medications and supplements 4. [name] functional ability including ADL's, fall risks, home safety risks and hearing or visual             impairment. 5. Diet and physical activities 6. Evidence for depression or mood disorders 7. Reviewed Updated provider list, see scanned forms and [facility] Snapshot.   The patients weight, height, BMI and visual acuity have been recorded in the chart I have made referrals, counseling and provided education to the patient based review of the above and I have provided the pt with a written personalized care plan for preventive services.  I have provided the patient with a copy of your personalized plan for preventive services. Instructed to take the time to review along with their updated medication list.  Follow-up: Return for f/u 6-8 weeks, 30 mins. Or follow-up in 1 year if not noted.  Meds ordered this encounter  Medications  . FLUoxetine (PROZAC) 20 MG tablet    Sig: Take 1 tablet (20 mg total) by mouth daily.    Dispense:  30 tablet    Refill:  5   Medications Discontinued During This Encounter  Medication Reason  . polyethylene glycol (MIRALAX / GLYCOLAX) packet Completed Course  . clonazePAM (KLONOPIN) 0.5 MG tablet Completed Course   Orders Placed This Encounter  Procedures  . Flu Vaccine QUAD 36+ mos IM  . Basic metabolic panel  . CBC with Differential/Platelet  . Hepatic function panel  . TSH     Signed,  Frederico Hamman T. Kerissa Coia, MD   Allergies as of 11/09/2016      Reactions   Aspirin    Chicken Allergy    unknown      Medication List       Accurate as of 11/09/16 11:59 PM. Always use your most recent med list.          ALPRAZolam 0.5 MG tablet Commonly known as:  XANAX Take 1 tablet (0.5 mg total) by mouth at bedtime as needed for anxiety.   donepezil 10 MG tablet Commonly known as:  ARICEPT take 1 tablet by mouth once daily   FLUoxetine 20 MG tablet Commonly known as:  PROZAC Take 1 tablet (20 mg total) by mouth daily.   hydrOXYzine 10 MG tablet Commonly known as:  ATARAX/VISTARIL take 1 tablet by mouth at bedtime   triamterene-hydrochlorothiazide 37.5-25 MG tablet Commonly known as:  MAXZIDE-25 take 1/2 tablet by mouth once daily

## 2016-11-09 NOTE — Progress Notes (Signed)
Pre visit review using our clinic review tool, if applicable. No additional management support is needed unless otherwise documented below in the visit note. 

## 2016-11-11 ENCOUNTER — Inpatient Hospital Stay: Payer: Medicare Other | Attending: Oncology

## 2016-11-14 ENCOUNTER — Encounter: Payer: Self-pay | Admitting: Emergency Medicine

## 2016-11-14 ENCOUNTER — Emergency Department: Payer: Medicare Other

## 2016-11-14 ENCOUNTER — Emergency Department
Admission: EM | Admit: 2016-11-14 | Discharge: 2016-11-14 | Disposition: A | Discharge status: Home / Self Care | Payer: Medicare Other | Attending: Emergency Medicine | Admitting: Emergency Medicine

## 2016-11-14 DIAGNOSIS — Z79899 Other long term (current) drug therapy: Secondary | ICD-10-CM | POA: Diagnosis not present

## 2016-11-14 DIAGNOSIS — R1084 Generalized abdominal pain: Secondary | ICD-10-CM | POA: Diagnosis not present

## 2016-11-14 DIAGNOSIS — Z85820 Personal history of malignant melanoma of skin: Secondary | ICD-10-CM | POA: Insufficient documentation

## 2016-11-14 DIAGNOSIS — R1013 Epigastric pain: Secondary | ICD-10-CM | POA: Diagnosis present

## 2016-11-14 DIAGNOSIS — I10 Essential (primary) hypertension: Secondary | ICD-10-CM | POA: Diagnosis not present

## 2016-11-14 DIAGNOSIS — Z85038 Personal history of other malignant neoplasm of large intestine: Secondary | ICD-10-CM | POA: Diagnosis not present

## 2016-11-14 DIAGNOSIS — Z87891 Personal history of nicotine dependence: Secondary | ICD-10-CM | POA: Diagnosis not present

## 2016-11-14 LAB — HEPATIC FUNCTION PANEL
ALBUMIN: 4.3 g/dL (ref 3.5–5.0)
ALT: 14 U/L — AB (ref 17–63)
AST: 28 U/L (ref 15–41)
Alkaline Phosphatase: 41 U/L (ref 38–126)
BILIRUBIN DIRECT: 0.4 mg/dL (ref 0.1–0.5)
Indirect Bilirubin: 0.8 mg/dL (ref 0.3–0.9)
TOTAL PROTEIN: 8.1 g/dL (ref 6.5–8.1)
Total Bilirubin: 1.2 mg/dL (ref 0.3–1.2)

## 2016-11-14 LAB — CBC
HCT: 38.3 % — ABNORMAL LOW (ref 40.0–52.0)
HEMOGLOBIN: 13.1 g/dL (ref 13.0–18.0)
MCH: 35.7 pg — ABNORMAL HIGH (ref 26.0–34.0)
MCHC: 34.2 g/dL (ref 32.0–36.0)
MCV: 104.3 fL — ABNORMAL HIGH (ref 80.0–100.0)
PLATELETS: 113 10*3/uL — AB (ref 150–440)
RBC: 3.67 MIL/uL — AB (ref 4.40–5.90)
RDW: 13.1 % (ref 11.5–14.5)
WBC: 3.7 10*3/uL — AB (ref 3.8–10.6)

## 2016-11-14 LAB — BASIC METABOLIC PANEL
ANION GAP: 11 (ref 5–15)
BUN: 27 mg/dL — ABNORMAL HIGH (ref 6–20)
CALCIUM: 9.1 mg/dL (ref 8.9–10.3)
CO2: 25 mmol/L (ref 22–32)
CREATININE: 1.09 mg/dL (ref 0.61–1.24)
Chloride: 106 mmol/L (ref 101–111)
Glucose, Bld: 91 mg/dL (ref 65–99)
Potassium: 4.2 mmol/L (ref 3.5–5.1)
SODIUM: 142 mmol/L (ref 135–145)

## 2016-11-14 LAB — TROPONIN I

## 2016-11-14 LAB — LIPASE, BLOOD: Lipase: 26 U/L (ref 11–51)

## 2016-11-14 MED ORDER — GI COCKTAIL ~~LOC~~
30.0000 mL | Freq: Once | ORAL | Status: AC
  Administered 2016-11-14: 30 mL via ORAL
  Filled 2016-11-14: qty 30

## 2016-11-14 NOTE — Progress Notes (Addendum)
LCSW Consulted with [place] and she provided home health care resource list for the family which was included in a package of additional resources as follows.:  Elder Care, List for Memory Care facilities, SNF- ALF, Hospice information for both [place] and [place].  Daughters plan HCPOA Edd Arbour [phone]  is for patient to return home and to get in home supports in place for pt and her Mother is currently on 2nd floor [facility] and has been receiving Palliative Care and will now come home and be supported by Hospice In-home services. A family care meeting is scheduled for next Thursday and family members will be making care plans for both patient and his wife.   LCSW met with patient and patients daughter. Verbal consent was given by patients caregiver, daughter and Cline Crock [phone] to conduct an assessment to assess what the patient and family needs are at this time. The patient currently resides at home with his wife. LCSW asked questions and collected data to complete assessment.  [employer] LCSW [phone]

## 2016-11-14 NOTE — Clinical Social Work Note (Signed)
Clinical Social Work Assessment  Patient Details  Name: John Summers MRN: [id number] Date of Birth: 04-10-1933  Date of referral:  11/14/16               Reason for consult:  [employer]                Permission sought to share information with:  Family Supports Permission granted to share information::  Yes, Verbal Permission Granted  Name::        Agency::     Relationship::     Contact Information: Edd Arbour [phone] HCPOA  Housing/Transportation Living arrangements for the past 2 months:  [place] of Information:  Patient, Adult Children Patient Interpreter Needed:  None Criminal Activity/Legal Involvement Pertinent to Current Situation/Hospitalization:  No - Comment as needed Significant Relationships:  Adult Children, Spouse Lives with:  Significant Other Do you feel safe going back to the place where you live?  Yes Need for family participation in patient care:  Yes (Comment)  Care giving concerns:  HCPOA requesting that patient return home with in home supports   Social Worker assessment / plan:  Patient is an 83 year old man with Lewy Body dementia who presented today after feeling weak, and pain in his stomach. This man is married and lives with his wife in a townhouse and daughter Chauncey Reading has been providing medication management, supervision, and care and support for both Patient and her Mother. Patient is oriented x2 and is hard of hearing. He is able to ambulate and is not incontinent and remains fairly independent with ADLs but now needs a little more assistance for dressing ( buttons) Patient reports he has good support at home.  LCSW met with patient and patients daughter. Verbal consent was given by patient and,  His daughter,  Cline Crock [phone] to conduct an assessment to assess what the patient and family needs are at this time. LCSW Consulted with [place] and she provided home health care  resource list for the family which was included in a package of additional resources as follows.:  Elder Care, List for Memory Care facilities, SNF- ALF, Hospice information for both [place] and [place].  Daughters plan HCPOA Edd Arbour [phone]  is for patient to return home and to get in home supports in place for pt and her Mother is currently on 2nd floor [facility] and has been receiving Palliative Care and will now come home and be supported by Hospice In-home services. A family care meeting is scheduled for next Thursday and family members will be making care plans for both patient and his wife.   No further needs at this time     Artis Delay [phone]  Employment status:  Retired Insurance underwriter information:  Information systems manager ([employer]) PT Recommendations:    Information / Referral to community resources:  [place], Other (Comment Required)  Patient/Family's Response to care: Wants him home and with home support  Patient/Family's Understanding of and Emotional Response to Diagnosis, Current Treatment, and Prognosis:  Good understanding of patient and family needs  Emotional Assessment Appearance:  Appears stated age Attitude/Demeanor/Rapport:   (Polite and calm) Affect (typically observed):  Appropriate, Quiet Orientation:  Oriented to Self, Oriented to Place Alcohol / Substance use:  Not Applicable Psych involvement (Current and /or in the community):  No (Comment)  Discharge Needs  Concerns to be addressed:  Care Coordination, Cognitive Concerns, Home Safety Concerns Readmission within the  last 30 days:  No Current discharge risk:  None Barriers to Discharge:  No Barriers Identified   Joana Reamer, LCSW 11/14/2016, 5:30 PM

## 2016-11-14 NOTE — ED Notes (Signed)
Pt alert and oriented X4, active, cooperative, pt in NAD. RR even and unlabored, color WNL.  Pt informed to return if any life threatening symptoms occur.   

## 2016-11-14 NOTE — ED Provider Notes (Signed)
[facility] Emergency Department Provider Note  ____________________________________________   I have reviewed the triage vital signs and the nursing notes.   HISTORY  Chief Complaint Chest Pain and Abdominal Pain    HPI John Summers is a 83 y.o. male with a history ofacid indigestion, pancreatic cyst recently evaluated by MRI, dementia, recurrent abdominal pain. States he has had burning in his epigastric region for the last couple days. Denies fever or chills denies chest pain only has a burning. Seems like his might be worse with some food. (poorly described. Patient has Lewy body dementia. The patient states that he has no exertional symptoms, no leg swelling, he has no cough or shortness of breath. He is here with his daughter who is an emergency room nurse. He has a known pancreatic cyst which has been evaluated by MR but they do not wish the patient to have surgery or aggressive care and therefore they do not wish any further aggressive diagnostic procedures done for this. Patient has had reflux disease for years and this feels similar to his reflux but because of his difficulty with expressing himself at baseline and they wanted to bring him in for further evaluation    Past Medical History:  Diagnosis Date  . Colon adenocarcinoma ([place])   . Complication of anesthesia    severe confusion  . GERD (gastroesophageal reflux disease)   . History of colon polyps   . History of gallstones   . Hx of transfusion of platelets   . Hypertension   . ITP (idiopathic thrombocytopenic purpura)    Dr. Grayland Ormond follows  . Kidney stones   . Lewy body dementia with behavioral disturbance   . Major depressive disorder, recurrent episode, in partial remission with anxious distress ([place])   . Pancreatitis 2/11  . Pollen allergies   . Skin cancer    ears  . Sleep apnea    "won't wear mask anymore" (12/22/2012)  . Unspecified essential hypertension     Patient  Active Problem List   Diagnosis Date Noted  . MGUS (monoclonal gammopathy of unknown significance) 05/10/2016  . Pancreatic mass 04/24/2016  . Thrombocytopenia ([place]) 10/04/2015  . Uncontrolled REM sleep behavior disorder 11/16/2014  . Lewy body dementia with behavioral disturbance 11/16/2014  . Tremor of right hand 11/16/2014  . Shuffling gait 11/16/2014  . Major depressive disorder, recurrent episode, in partial remission with anxious distress ([place])   . REM sleep behavior disorder 12/07/2013  . Incisional hernia, without obstruction or gangrene 11/14/2012  . Adenocarcinoma of cecum ([place]) 03/19/2012  . GERD (gastroesophageal reflux disease) 03/19/2012  . Sleep apnea   . Essential hypertension     Past Surgical History:  Procedure Laterality Date  . CARDIAC CATHETERIZATION  2000's   "tx'd w/acid reflux RX" (12/22/2012)  . CHOLECYSTECTOMY  2011  . ESOPHAGEAL DILATION  1990's?  Marland Kitchen HERNIA REPAIR  12/22/2012   IHR w/mesh  . INGUINAL HERNIA REPAIR Left [id number]  . INSERTION OF MESH N/A 12/22/2012   Procedure: INSERTION OF MESH;  Surgeon: Harl Bowie, MD;  Location: [place];  Service: General;  Laterality: N/A;  . LITHOTRIPSY  2008   x2  . PARTIAL COLECTOMY  03/21/2012   Procedure: PARTIAL COLECTOMY;  Surgeon: Harl Bowie, MD;  Location: [place];  Service: General;  Laterality: Right;  . PILONIDAL CYST EXCISION  1960's?  Marland Kitchen SKIN CANCER EXCISION     "face; ?left ear" (12/22/2012)  . TONSILLECTOMY AND ADENOIDECTOMY  1946  .  TOTAL HIP ARTHROPLASTY Left ~ 2002  . UPPER GASTROINTESTINAL ENDOSCOPY    . VENTRAL HERNIA REPAIR N/A 12/22/2012   Procedure: HERNIA REPAIR INCISIONAL ;  Surgeon: Harl Bowie, MD;  Location: [place];  Service: General;  Laterality: N/A;    Prior to Admission medications   Medication Sig Start Date End Date Taking? Authorizing Provider  ALPRAZolam Duanne Moron) 0.5 MG tablet Take 1 tablet (0.5 mg total) by mouth at bedtime as needed for anxiety. 10/16/16   Asencion Partridge  Dohmeier, MD  donepezil (ARICEPT) 10 MG tablet take 1 tablet by mouth once daily 11/09/16   Larey Seat, MD  FLUoxetine (PROZAC) 20 MG tablet Take 1 tablet (20 mg total) by mouth daily. 11/09/16   Owens Loffler, MD  hydrOXYzine (ATARAX/VISTARIL) 10 MG tablet take 1 tablet by mouth at bedtime 09/09/16   Larey Seat, MD  triamterene-hydrochlorothiazide [facility]) 37.5-25 MG tablet take 1/2 tablet by mouth once daily 10/29/16   Owens Loffler, MD    Allergies Aspirin and Chicken allergy  Family History  Problem Relation Age of Onset  . Colon cancer Mother   . Heart disease Mother   . Hypertension Mother   . Alcohol abuse Father   . Esophageal cancer Neg Hx   . Rectal cancer Neg Hx   . Stomach cancer Neg Hx     Social History Social History  Substance Use Topics  . Smoking status: Former Smoker    Types: Pipe  . Smokeless tobacco: Never Used     Comment: i4/06/2013 "only smoked pipe n college 1958"  . Alcohol use No    Review of Systems Constitutional: No fever/chills Eyes: No visual changes. ENT: No sore throat. No stiff neck no neck pain Cardiovascular: Denies chest pain. Respiratory: Denies shortness of breath. Gastrointestinal:   no vomiting.  No diarrhea.  No constipation. Genitourinary: Negative for dysuria. Musculoskeletal: Negative lower extremity swelling Skin: Negative for rash. Neurological: Negative for severe headaches, focal weakness or numbness. 10-point ROS otherwise negative.  ____________________________________________   PHYSICAL EXAM:  VITAL SIGNS: ED Triage Vitals  Enc Vitals Group     BP 11/14/16 1407 121/79     Pulse Rate 11/14/16 1407 64     Resp 11/14/16 1407 13     Temp 11/14/16 1407 97.6 F (36.4 C)     Temp Source 11/14/16 1407 Oral     SpO2 11/14/16 1407 97 %     Weight 11/14/16 1407 210 lb (95.3 kg)     Height 11/14/16 1407 5\' 10"  (1.778 m)     Head Circumference --      Peak Flow --      Pain Score 11/14/16 1416 0      Pain Loc --      Pain Edu? --      Excl. in [place]? --     Constitutional: Alert and To name and place unsure of the date at baseline per family. Well appearing and in no acute distress. Eyes: Conjunctivae are normal. PERRL. EOMI. Head: Atraumatic. Nose: No congestion/rhinnorhea. Mouth/Throat: Mucous membranes are moist.  Oropharynx non-erythematous. Neck: No stridor.   Nontender with no meningismus Cardiovascular: Normal rate, regular rhythm. Grossly normal heart sounds.  Good peripheral circulation. Respiratory: Normal respiratory effort.  No retractions. Lungs CTAB. Abdominal: Soft and minimal epigastric discomfort which reproduces his pain nonsurgical abdomen. No distention. No guarding no rebound Back:  There is no focal tenderness or step off.  there is no midline tenderness there are no lesions noted. there is  no CVA tenderness Musculoskeletal: No lower extremity tenderness, no upper extremity tenderness. No joint effusions, no DVT signs strong distal pulses no edema Neurologic:  Normal speech and language. No gross focal neurologic deficits are appreciated.  Skin:  Skin is warm, dry and intact. No rash noted. Psychiatric: Mood and affect are normal. Speech and behavior are normal.  ____________________________________________   LABS (all labs ordered are listed, but only abnormal results are displayed)  Labs Reviewed  CBC - Abnormal; Notable for the following:       Result Value   WBC 3.7 (*)    RBC 3.67 (*)    HCT 38.3 (*)    MCV 104.3 (*)    MCH 35.7 (*)    All other components within normal limits  BASIC METABOLIC PANEL  TROPONIN I  HEPATIC FUNCTION PANEL  LIPASE, BLOOD   ____________________________________________  EKG  I personally interpreted any EKGs ordered by me or triage Sinus rhythm rate 69 bpm no acute ST elevation or acute ST depression, LAD noted. Nonspecific ST changes no acute ischemia ____________________________________________  RADIOLOGY  I  reviewed any imaging ordered by me or triage that were performed during my shift and, if possible, patient and/or family made aware of any abnormal findings. ____________________________________________   PROCEDURES  Procedure(s) performed: None  Procedures  Critical Care performed: None  ____________________________________________   INITIAL IMPRESSION / ASSESSMENT AND PLAN / ED COURSE  Pertinent labs & imaging results that were available during my care of the patient were reviewed by me and considered in my medical decision making (see chart for details).  The patient is here with reproducible mild epigastric pain. He is status post cholecystectomy in the past. Abdomen is completely benign otherwise. We did give him a GI cocktail he has no pain and no symptoms. Lucinda second troponin although I have low suspicion for ACS given the reproducibly of the discomfort. I discussed extensively with his daughter, about whether or not they would want him to have a CT scan and they do not. They don't see any utility in as there would be no intervention likely that they would agree to irrespective of what is found. The patient has no complaints and is at his baseline. We will resend her cardiac markers a precaution signed out to Dr. Leslie Dales at the end of my shift    ____________________________________________   FINAL CLINICAL IMPRESSION(S) / ED DIAGNOSES  Final diagnoses:  None      This chart was dictated using voice recognition software.  Despite best efforts to proofread,  errors can occur which can change meaning.      Schuyler Amor, MD 11/14/16 [phone]

## 2016-11-14 NOTE — ED Provider Notes (Signed)
Care signed over from Dr. Burlene Arnt pending second troponin. Troponin remains negative. The patient has a nonacute abdomen. He is medically stable for outpatient management.   Darel Hong, MD 11/14/16 [phone]

## 2016-11-14 NOTE — Discharge Instructions (Signed)
Please return to the emergency department for any new or worsening symptoms such as worsening pain, fevers, chills, or for any other concerns.  It was a pleasure to take care of you today, and thank you for coming to our emergency department.  If you have any questions or concerns before leaving please ask the nurse to grab me and I'm more than happy to go through your aftercare instructions again.  If you were prescribed any opioid pain medication today such as Norco, Vicodin, Percocet, morphine, hydrocodone, or oxycodone please make sure you do not drive when you are taking this medication as it can alter your ability to drive safely.  If you have any concerns once you are home that you are not improving or are in fact getting worse before you can make it to your follow-up appointment, please do not hesitate to call 911 and come back for further evaluation.  Darel Hong MD  Results for orders placed or performed during the hospital encounter of [id number]  Basic metabolic panel  Result Value Ref Range   Sodium 142 135 - 145 mmol/L   Potassium 4.2 3.5 - 5.1 mmol/L   Chloride 106 101 - 111 mmol/L   CO2 25 22 - 32 mmol/L   Glucose, Bld 91 65 - 99 mg/dL   BUN 27 (H) 6 - 20 mg/dL   Creatinine, Ser 1.09 0.61 - 1.24 mg/dL   Calcium 9.1 8.9 - 10.3 mg/dL   GFR calc non Af Amer >60 >60 mL/min   GFR calc Af Amer >60 >60 mL/min   Anion gap 11 5 - 15  CBC  Result Value Ref Range   WBC 3.7 (L) 3.8 - 10.6 K/uL   RBC 3.67 (L) 4.40 - 5.90 MIL/uL   Hemoglobin 13.1 13.0 - 18.0 g/dL   HCT 38.3 (L) 40.0 - 52.0 %   MCV 104.3 (H) 80.0 - 100.0 fL   MCH 35.7 (H) 26.0 - 34.0 pg   MCHC 34.2 32.0 - 36.0 g/dL   RDW 13.1 11.5 - 14.5 %   Platelets 113 (L) 150 - 440 K/uL  Troponin I  Result Value Ref Range   Troponin I <0.03 <0.03 ng/mL  Hepatic function panel  Result Value Ref Range   Total Protein 8.1 6.5 - 8.1 g/dL   Albumin 4.3 3.5 - 5.0 g/dL   AST 28 15 - 41 U/L   ALT 14 (L) 17 - 63 U/L   Alkaline Phosphatase 41 38 - 126 U/L   Total Bilirubin 1.2 0.3 - 1.2 mg/dL   Bilirubin, Direct 0.4 0.1 - 0.5 mg/dL   Indirect Bilirubin 0.8 0.3 - 0.9 mg/dL  Lipase, blood  Result Value Ref Range   Lipase 26 11 - 51 U/L  Troponin I  Result Value Ref Range   Troponin I <0.03 <0.03 ng/mL   Dg Chest 2 View  Result Date: 11/14/2016 CLINICAL DATA:  Pt presents to ED via [facility] from home c/o mid chest pain/epigastric pain 4/10 x3 days. Pt denies pain upon arrival, states it resolved within the last hour. Hx dementia. Pt states his wife is currently admitted to hospital. EXAM: CHEST  2 VIEW COMPARISON:  12/08/2012 FINDINGS: Heart size is normal. There are no focal consolidations or pleural effusions. No pulmonary edema. IMPRESSION: No active cardiopulmonary disease. Electronically Signed   By: Nolon Nations M.D.   On: 11/14/2016 14:46

## 2016-11-14 NOTE — ED Triage Notes (Signed)
Pt presents to ED via [facility] from home c/o mid chest pain/epigastric pain 4/10 x3 days. Pt denies pain upon arrival, states it resolved within the last hour. Hx dementia. Pt states his wife is currently admitted to hospital.

## 2016-11-14 NOTE — ED Notes (Signed)
Patient transported to X-ray 

## 2016-11-14 NOTE — ED Notes (Signed)
ED Provider at bedside. 

## 2016-11-17 ENCOUNTER — Ambulatory Visit: Payer: Medicare Other | Admitting: Oncology

## 2016-11-17 ENCOUNTER — Inpatient Hospital Stay: Payer: Medicare Other | Admitting: Oncology

## 2016-11-18 ENCOUNTER — Ambulatory Visit: Payer: Medicare Other | Admitting: Oncology

## 2016-11-25 ENCOUNTER — Telehealth: Payer: Self-pay

## 2016-11-25 NOTE — Telephone Encounter (Signed)
I am assuming she is asking for a FL-2 form.  Form printed and placed in Dr. Lillie Fragmin in box to complete.

## 2016-11-25 NOTE — Telephone Encounter (Signed)
Pt's daughter called, is requesting a form to be filled out for [facility] in [place] and faxed to [phone]. She said is a form that we should have, for memory care.

## 2016-11-26 NOTE — Telephone Encounter (Signed)
Done and faxed

## 2016-12-02 ENCOUNTER — Ambulatory Visit: Payer: Medicare Other | Admitting: Neurology

## 2016-12-04 ENCOUNTER — Other Ambulatory Visit: Payer: Self-pay | Admitting: Neurology

## 2016-12-05 ENCOUNTER — Other Ambulatory Visit: Payer: Self-pay | Admitting: Neurology

## 2016-12-07 NOTE — Telephone Encounter (Signed)
Fax comfirmation received klonopin [phone].

## 2016-12-08 ENCOUNTER — Telehealth: Payer: Self-pay

## 2016-12-08 NOTE — Telephone Encounter (Signed)
John Summers left v/m requesting FL2 that has already been filled out to be faxed to a different facility; [facility] [place] fax # [phone] This facility is near where pt son lives. Pt will also need a TB skin test. [name] wants to know if she could place the TB skin test due to pt not traveling well.John Summers request cb.

## 2016-12-09 ENCOUNTER — Ambulatory Visit (INDEPENDENT_AMBULATORY_CARE_PROVIDER_SITE_OTHER): Payer: Medicare Other | Admitting: *Deleted

## 2016-12-09 DIAGNOSIS — Z111 Encounter for screening for respiratory tuberculosis: Secondary | ICD-10-CM

## 2016-12-09 NOTE — Telephone Encounter (Signed)
Please send to [facility] as requested.   It would be ok with me if she placed the PPD - if she has supplies, since she is an Therapist, sports and is trained to read PPD's.

## 2016-12-09 NOTE — Telephone Encounter (Signed)
FL-2 form faxed to [place] at [phone].  Spoke with [employer].  She will bring Mr. Pitter by the office today and I will go out to the car and administer his PPD.

## 2016-12-11 LAB — TB SKIN TEST
Induration: 0 mm
TB SKIN TEST: NEGATIVE

## 2016-12-14 ENCOUNTER — Encounter: Payer: Self-pay | Admitting: *Deleted

## 2016-12-14 NOTE — Progress Notes (Deleted)
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  INTERVAL HISTORY: Patient returns to clinic today for laboratory work and routine clinical evaluation. He feels his memory continues to decline, but otherwise feels well. He has had no recent fevers.  He denies any easy bleeding or bruising.  He denies any weakness or fatigue. He has a good appetite and has maintained his weight.  He denies any chest pain, shortness breath, cough, or hemoptysis.  He denies any bony pain. He denies nausea or constipation.  He denies any melena or hematochezia.  Patient offers no further specific complaints today.   REVIEW OF SYSTEMS:   Review of Systems  Constitutional: Positive for malaise/fatigue. Negative for fever and weight loss.  Eyes: Negative.   Respiratory: Negative.  Negative for cough and shortness of breath.   Cardiovascular: Negative.  Negative for chest pain.  Gastrointestinal: Negative.  Negative for diarrhea and vomiting.  Genitourinary: Negative.   Musculoskeletal: Negative.   Skin: Negative.   Neurological: Positive for weakness.  Endo/Heme/Allergies: Negative.   Psychiatric/Behavioral: Positive for memory loss. The patient is nervous/anxious.     As per HPI. Otherwise, a complete review of systems is negatve.  PAST MEDICAL HISTORY: Past Medical History:  Diagnosis Date  . Colon adenocarcinoma (John Summers)   . Complication of anesthesia    severe confusion  . GERD (gastroesophageal reflux disease)   . History of colon polyps   . History of gallstones   . Hx of transfusion of platelets   . Hypertension   . ITP (idiopathic thrombocytopenic purpura)    Dr. Grayland Ormond follows  . Kidney stones   . Lewy body dementia with behavioral disturbance   . Major depressive disorder, recurrent episode, in partial remission with anxious distress ([place])   . Pancreatitis 2/11  . Pollen allergies   . Skin cancer    ears  . Sleep apnea    "won't wear mask anymore" (12/22/2012)  . Unspecified essential hypertension     PAST SURGICAL HISTORY: Past Surgical History:  Procedure Laterality Date  . CARDIAC CATHETERIZATION  2000's   "tx'd w/acid reflux RX" (12/22/2012)  . CHOLECYSTECTOMY  2011  . ESOPHAGEAL DILATION  1990's?  Marland Kitchen HERNIA REPAIR  12/22/2012   IHR w/mesh  . INGUINAL HERNIA REPAIR Left [id number]  . INSERTION OF MESH N/A 12/22/2012   Procedure: INSERTION OF MESH;  Surgeon: Harl Bowie, MD;  Location: [facility];  Service: General;  Laterality: N/A;  . LITHOTRIPSY  2008   x2  . PARTIAL COLECTOMY  03/21/2012   Procedure: PARTIAL COLECTOMY;  Surgeon: Harl Bowie, MD;  Location: [facility];  Service: General;  Laterality: Right;  . PILONIDAL CYST EXCISION  1960's?  Marland Kitchen SKIN CANCER EXCISION     "face; ?left ear" (12/22/2012)  . TONSILLECTOMY AND ADENOIDECTOMY  1946  . TOTAL HIP ARTHROPLASTY Left ~ 2002  . UPPER GASTROINTESTINAL ENDOSCOPY    . VENTRAL HERNIA REPAIR N/A 12/22/2012   Procedure: HERNIA REPAIR INCISIONAL ;  Surgeon: Harl Bowie, MD;  Location: [facility];  Service: General;  Laterality: N/A;    FAMILY HISTORY Family History  Problem Relation Age of Onset  . Colon cancer Mother   . Heart disease Mother   . Hypertension Mother   . Alcohol abuse Father   . Esophageal cancer Neg Hx   . Rectal cancer Neg Hx   . Stomach cancer Neg Hx        ADVANCED DIRECTIVES:    HEALTH MAINTENANCE: Social History  Substance  Use Topics  . Smoking status: Former Smoker    Types: Pipe  . Smokeless tobacco: Never Used     Comment: i4/06/2013 "only smoked pipe n college 1958"  . Alcohol use No     Colonoscopy:  PAP:  Bone density:  Lipid panel:  Allergies  Allergen Reactions  . Aspirin   . Chicken Allergy Nausea And Vomiting    unknown    Current Outpatient Prescriptions  Medication Sig Dispense Refill  . ALPRAZolam (XANAX) 0.5 MG tablet Take 1 tablet (0.5 mg total) by mouth at bedtime as needed for anxiety. 30 tablet 0  . clonazePAM (KLONOPIN) 0.5 MG tablet Take 0.5 tablets (0.25 mg total) by mouth at bedtime. 45 tablet 1  . donepezil (ARICEPT) 10 MG tablet take 1 tablet by mouth once daily 30 tablet 5  . FLUoxetine (PROZAC) 20 MG tablet Take 1 tablet (20 mg total) by mouth daily. 30 tablet 5  . hydrOXYzine (ATARAX/VISTARIL) 10 MG tablet take 1 tablet by mouth at bedtime 90 tablet 0  . triamterene-hydrochlorothiazide (MAXZIDE-25) 37.5-25 MG tablet take 1/2 tablet by mouth once daily 45 tablet 1   No current facility-administered medications for this visit.     OBJECTIVE: There were no vitals filed for this visit.   There is no height or weight on file to calculate BMI.    ECOG FS:0 - Asymptomatic  General: Well-developed, well-nourished, no acute distress. Eyes: Pink conjunctiva, anicteric sclera. Lungs: Clear to auscultation bilaterally. Heart: Regular rate and rhythm. No rubs, murmurs, or gallops. Abdomen: Soft, nontender, nondistended. No organomegaly noted, normoactive bowel sounds. Musculoskeletal: No edema, cyanosis, or clubbing. Neuro: Alert, answering all questions appropriately. Cranial nerves grossly intact. Skin: No rashes or petechiae noted. Psych: Normal affect.   LAB RESULTS:  Lab Results  Component Value Date   NA 142 11/14/2016   K 4.2 11/14/2016   CL 106 11/14/2016   CO2 25 11/14/2016   GLUCOSE 91 11/14/2016   BUN 27 (H) 11/14/2016   CREATININE 1.09 11/14/2016    CALCIUM 9.1 11/14/2016   PROT 8.1 11/14/2016   ALBUMIN 4.3 11/14/2016   AST 28 11/14/2016   ALT 14 (L) 11/14/2016   ALKPHOS 41 11/14/2016   BILITOT 1.2 11/14/2016   GFRNONAA >60 11/14/2016   GFRAA >60 11/14/2016    Lab Results  Component Value Date   WBC 3.7 (L) 11/14/2016   NEUTROABS 1.5 11/09/2016   HGB 13.1 11/14/2016   HCT 38.3 (L) 11/14/2016   MCV 104.3 (H) 11/14/2016   PLT 113 (L) 11/14/2016   Lab Results  Component Value Date   TOTALPROTELP 7.2 05/06/2016   ALBUMINELP 3.7 05/06/2016   A1GS 0.2 05/06/2016   A2GS 0.6 05/06/2016   BETS 2.1 (H) 05/06/2016   GAMS 0.6 05/06/2016   MSPIKE 1.5 (H) 05/06/2016   SPEI Comment 05/06/2016     STUDIES: No results found.  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  PLAN:    1.  MGUS: Patient's M spike remains essentially unchanged at 1.5.  Given the stability of his lab work and the fact that he is asymptomatic, a bone marrow biopsy is not necessary at this time. Previously, metastatic bone survey and CT scan were negative.  Because he has a suppressed IgA and IgM, it is possible that this is smoldering IgG multiple myeloma rather than MGUS. Smoldering multiple myeloma does not require treatment.  If he shows evidence of further endorgan damage would then proceed with a bone marrow biopsy and consideration of treatment.  Return to clinic in 3 months for cbc and then in 6 months for repeat laboratory work and further evaluation.   2.  Thrombocytopenia: Patient's platelet count appears proximally his baseline, monitor.  3.  Leukopenia: Stable and unchanged, monitor. 4.  Stage IIa adenocarcinoma of the cecum: Patient is stage IIa with no high risk features, therefore adjuvant chemotherapy was not necessary.  CEA continues to be within normal limits.   5.  Lewy Body Dementia:  Continue follow up with neurologist.  6.  Pancreas lesion: CT scan results reviewed  independently and reported as above with increasing size of lesion. Patient reports he has an MRI later this week to further evaluate. Continue monitoring and diagnostic planning per GI.  Patient expressed understanding and was in agreement with this plan. He also understands that He can call clinic at any time with any questions, concerns, or complaints.   Lloyd Huger, MD   12/14/2016 11:53 PM

## 2016-12-14 NOTE — Telephone Encounter (Signed)
John Summers called to notified me that John Summers PPD was negative.  PPD result letter faxed to [name] at [phone].

## 2016-12-15 ENCOUNTER — Inpatient Hospital Stay: Payer: Medicare Other | Admitting: Oncology

## 2016-12-20 ENCOUNTER — Encounter: Payer: Self-pay | Admitting: Family Medicine

## 2016-12-21 ENCOUNTER — Ambulatory Visit: Payer: Medicare Other | Admitting: Family Medicine

## 2016-12-21 MED ORDER — ACETAMINOPHEN 500 MG PO TABS: 1000.0000 mg | ORAL_TABLET | Freq: Four times a day (QID) | ORAL | 99 refills | Status: DC | PRN

## 2016-12-21 NOTE — Telephone Encounter (Signed)
John Summers,   Can you help me do this for [name].  Tylenol 500 mg, 2 tabs po 4 times a day prn pain.   Order and faxed as written in patient note.

## 2016-12-21 NOTE — Telephone Encounter (Signed)
Tylenol Rx printed and faxed to [facility] [phone] as instructed by Dr. Lorelei Pont.

## 2016-12-29 ENCOUNTER — Telehealth: Payer: Self-pay

## 2016-12-29 NOTE — Telephone Encounter (Signed)
Christy with [facility] [place] left v/m; pt fell in room without apparent injury; [facility] requesting faxed order for PT. Alyse Low said pt is doing well in transition to facility.

## 2016-12-30 ENCOUNTER — Encounter: Payer: Self-pay | Admitting: Family Medicine

## 2016-12-30 DIAGNOSIS — R269 Unspecified abnormalities of gait and mobility: Secondary | ICD-10-CM | POA: Insufficient documentation

## 2016-12-30 NOTE — Telephone Encounter (Signed)
done

## 2016-12-30 NOTE — Telephone Encounter (Signed)
Please write PT order on prescription to fax over to [facility].  It is for balance training.

## 2016-12-30 NOTE — Telephone Encounter (Signed)
Can you see if they can take a verbal order?  I am assuming for balance training?

## 2016-12-30 NOTE — Telephone Encounter (Signed)
PT orders faxed to [facility] at [phone].

## 2017-01-01 ENCOUNTER — Telehealth: Payer: Self-pay

## 2017-01-01 DIAGNOSIS — R269 Unspecified abnormalities of gait and mobility: Secondary | ICD-10-CM | POA: Diagnosis not present

## 2017-01-01 DIAGNOSIS — M2681 Anterior soft tissue impingement: Secondary | ICD-10-CM | POA: Diagnosis not present

## 2017-01-01 DIAGNOSIS — G3183 Dementia with Lewy bodies: Secondary | ICD-10-CM | POA: Diagnosis not present

## 2017-01-01 NOTE — Telephone Encounter (Signed)
Rosemary with [facility] left v/m requesting verbal order to begin [facility] for pt.

## 2017-01-01 NOTE — Telephone Encounter (Signed)
Please help with verbal order for [facility]

## 2017-01-01 NOTE — Telephone Encounter (Signed)
Spoke with [employer].  She states they already have the order, they just need the last office note we have on Mr. Krupka.  Office note from from 11/09/2016 printed and faxed to [facility] at [phone].

## 2017-01-06 ENCOUNTER — Encounter: Payer: Self-pay | Admitting: Emergency Medicine

## 2017-01-06 ENCOUNTER — Observation Stay
Admission: EM | Admit: 2017-01-06 | Discharge: 2017-01-07 | Disposition: A | Discharge status: Home / Self Care | Payer: Medicare Other | Attending: Internal Medicine | Admitting: Internal Medicine

## 2017-01-06 ENCOUNTER — Emergency Department: Payer: Medicare Other

## 2017-01-06 DIAGNOSIS — Z87891 Personal history of nicotine dependence: Secondary | ICD-10-CM | POA: Insufficient documentation

## 2017-01-06 DIAGNOSIS — N189 Chronic kidney disease, unspecified: Secondary | ICD-10-CM | POA: Diagnosis not present

## 2017-01-06 DIAGNOSIS — Z91018 Allergy to other foods: Secondary | ICD-10-CM | POA: Insufficient documentation

## 2017-01-06 DIAGNOSIS — Z79899 Other long term (current) drug therapy: Secondary | ICD-10-CM | POA: Diagnosis not present

## 2017-01-06 DIAGNOSIS — R109 Unspecified abdominal pain: Secondary | ICD-10-CM

## 2017-01-06 DIAGNOSIS — K219 Gastro-esophageal reflux disease without esophagitis: Secondary | ICD-10-CM | POA: Insufficient documentation

## 2017-01-06 DIAGNOSIS — Z86010 Personal history of colonic polyps: Secondary | ICD-10-CM | POA: Diagnosis not present

## 2017-01-06 DIAGNOSIS — R55 Syncope and collapse: Secondary | ICD-10-CM | POA: Diagnosis present

## 2017-01-06 DIAGNOSIS — Z66 Do not resuscitate: Secondary | ICD-10-CM | POA: Insufficient documentation

## 2017-01-06 DIAGNOSIS — Z992 Dependence on renal dialysis: Secondary | ICD-10-CM | POA: Diagnosis not present

## 2017-01-06 DIAGNOSIS — F0280 Dementia in other diseases classified elsewhere without behavioral disturbance: Secondary | ICD-10-CM | POA: Diagnosis not present

## 2017-01-06 DIAGNOSIS — I959 Hypotension, unspecified: Secondary | ICD-10-CM | POA: Insufficient documentation

## 2017-01-06 DIAGNOSIS — Z85038 Personal history of other malignant neoplasm of large intestine: Secondary | ICD-10-CM | POA: Insufficient documentation

## 2017-01-06 DIAGNOSIS — Z886 Allergy status to analgesic agent status: Secondary | ICD-10-CM | POA: Insufficient documentation

## 2017-01-06 DIAGNOSIS — Z8249 Family history of ischemic heart disease and other diseases of the circulatory system: Secondary | ICD-10-CM | POA: Diagnosis not present

## 2017-01-06 DIAGNOSIS — G3183 Dementia with Lewy bodies: Secondary | ICD-10-CM | POA: Insufficient documentation

## 2017-01-06 DIAGNOSIS — G4730 Sleep apnea, unspecified: Secondary | ICD-10-CM | POA: Diagnosis not present

## 2017-01-06 DIAGNOSIS — Z85828 Personal history of other malignant neoplasm of skin: Secondary | ICD-10-CM | POA: Insufficient documentation

## 2017-01-06 DIAGNOSIS — Z87442 Personal history of urinary calculi: Secondary | ICD-10-CM | POA: Diagnosis not present

## 2017-01-06 DIAGNOSIS — N179 Acute kidney failure, unspecified: Secondary | ICD-10-CM | POA: Insufficient documentation

## 2017-01-06 DIAGNOSIS — I129 Hypertensive chronic kidney disease with stage 1 through stage 4 chronic kidney disease, or unspecified chronic kidney disease: Secondary | ICD-10-CM | POA: Diagnosis not present

## 2017-01-06 LAB — URINALYSIS, COMPLETE (UACMP) WITH MICROSCOPIC
BACTERIA UA: NONE SEEN
BILIRUBIN URINE: NEGATIVE
GLUCOSE, UA: NEGATIVE mg/dL
HGB URINE DIPSTICK: NEGATIVE
Ketones, ur: NEGATIVE mg/dL
LEUKOCYTES UA: NEGATIVE
Nitrite: NEGATIVE
PROTEIN: NEGATIVE mg/dL
Specific Gravity, Urine: 1.018 (ref 1.005–1.030)
pH: 5 (ref 5.0–8.0)

## 2017-01-06 LAB — CBC WITH DIFFERENTIAL/PLATELET
Basophils Absolute: 0 10*3/uL (ref 0–0.1)
Basophils Relative: 1 %
Eosinophils Absolute: 0 10*3/uL (ref 0–0.7)
Eosinophils Relative: 1 %
HEMATOCRIT: 36.6 % — AB (ref 40.0–52.0)
Hemoglobin: 12.9 g/dL — ABNORMAL LOW (ref 13.0–18.0)
LYMPHS PCT: 40 %
Lymphs Abs: 1.4 10*3/uL (ref 1.0–3.6)
MCH: 37 pg — ABNORMAL HIGH (ref 26.0–34.0)
MCHC: 35.4 g/dL (ref 32.0–36.0)
MCV: 104.4 fL — AB (ref 80.0–100.0)
MONO ABS: 0.6 10*3/uL (ref 0.2–1.0)
MONOS PCT: 18 %
NEUTROS ABS: 1.4 10*3/uL (ref 1.4–6.5)
Neutrophils Relative %: 40 %
Platelets: 104 10*3/uL — ABNORMAL LOW (ref 150–440)
RBC: 3.5 MIL/uL — ABNORMAL LOW (ref 4.40–5.90)
RDW: 12.5 % (ref 11.5–14.5)
WBC: 3.6 10*3/uL — AB (ref 3.8–10.6)

## 2017-01-06 LAB — COMPREHENSIVE METABOLIC PANEL
ALK PHOS: 47 U/L (ref 38–126)
ALT: 13 U/L — ABNORMAL LOW (ref 17–63)
ANION GAP: 8 (ref 5–15)
AST: 28 U/L (ref 15–41)
Albumin: 4.2 g/dL (ref 3.5–5.0)
BILIRUBIN TOTAL: 0.9 mg/dL (ref 0.3–1.2)
BUN: 32 mg/dL — ABNORMAL HIGH (ref 6–20)
CALCIUM: 9.2 mg/dL (ref 8.9–10.3)
CO2: 27 mmol/L (ref 22–32)
Chloride: 103 mmol/L (ref 101–111)
Creatinine, Ser: 1.68 mg/dL — ABNORMAL HIGH (ref 0.61–1.24)
GFR calc non Af Amer: 36 mL/min — ABNORMAL LOW (ref >60)
GFR, EST AFRICAN AMERICAN: 42 mL/min — AB (ref >60)
Glucose, Bld: 115 mg/dL — ABNORMAL HIGH (ref 65–99)
POTASSIUM: 3.4 mmol/L — AB (ref 3.5–5.1)
SODIUM: 138 mmol/L (ref 135–145)
TOTAL PROTEIN: 7.9 g/dL (ref 6.5–8.1)

## 2017-01-06 LAB — LIPASE, BLOOD: LIPASE: 68 U/L — AB (ref 11–51)

## 2017-01-06 LAB — TROPONIN I: Troponin I: <0.03 ng/mL (ref <0.03)

## 2017-01-06 MED ORDER — SODIUM CHLORIDE 0.9 % IV BOLUS (SEPSIS)
1000.0000 mL | Freq: Once | INTRAVENOUS | Status: AC
  Administered 2017-01-06: 1000 mL via INTRAVENOUS

## 2017-01-06 MED ORDER — IOPAMIDOL (ISOVUE-300) INJECTION 61%
75.0000 mL | Freq: Once | INTRAVENOUS | Status: AC | PRN
  Administered 2017-01-06: 75 mL via INTRAVENOUS

## 2017-01-06 MED ORDER — IOPAMIDOL (ISOVUE-300) INJECTION 61%
30.0000 mL | Freq: Once | INTRAVENOUS | Status: AC | PRN
  Administered 2017-01-06: 30 mL via ORAL

## 2017-01-06 NOTE — ED Triage Notes (Signed)
Patient comes in via [facility] from [facility] with a syncopal episode. Per EMS patient stood, got dizzy and disoriented, per patient denies falling and hitting head. Per EMS daughter reports this is his baseline mental status. He does have dementia. Per EMS patient has had diarrhea for the last 2-3 days with 1 epsiode every day. Patient denies any vomiting. Today is his first meal in a few days.

## 2017-01-06 NOTE — ED Notes (Signed)
Called CT patient done with contrast.

## 2017-01-06 NOTE — ED Provider Notes (Signed)
[facility] Provider Note   CSN: [id number] Arrival date & time: 01/06/17  1827     History   Chief Complaint Chief Complaint  Patient presents with  . Near Syncope    HPI John Summers is a 83 y.o. male hx of previous colon cancer, known pancreatic mass, Here presenting with near syncope. Patient has been having poor appetite for several days and have 1 episode diarrhea 3 days ago. He has not been eating or drinking much for the last 3 days. He went to the [facility] and had dinner and got up and felt dizzy and almost passed out. He was staring ahead and seemed unresponsive for several seconds. This was witnessed by his daughter and bystanders and was helped to the floor but did not hit his head. Denies any chest pain prior to the event. No hx of CAD.   The history is provided by the patient.    Past Medical History:  Diagnosis Date  . Colon adenocarcinoma ([place])   . Complication of anesthesia    severe confusion  . GERD (gastroesophageal reflux disease)   . History of colon polyps   . History of gallstones   . Hx of transfusion of platelets   . Hypertension   . ITP (idiopathic thrombocytopenic purpura)    Dr. Grayland Ormond follows  . Kidney stones   . Lewy body dementia with behavioral disturbance   . Major depressive disorder, recurrent episode, in partial remission with anxious distress ([place])   . Pancreatitis 2/11  . Pollen allergies   . Skin cancer    ears  . Sleep apnea    "won't wear mask anymore" (12/22/2012)  . Unspecified essential hypertension     Patient Active Problem List   Diagnosis Date Noted  . Gait disturbance 12/30/2016  . MGUS (monoclonal gammopathy of unknown significance) 05/10/2016  . Pancreatic mass 04/24/2016  . Thrombocytopenia ([place]) 10/04/2015  . Uncontrolled REM sleep behavior disorder 11/16/2014  . Lewy body dementia with behavioral disturbance 11/16/2014  . Tremor of right hand 11/16/2014  . Shuffling gait  11/16/2014  . Major depressive disorder, recurrent episode, in partial remission with anxious distress ([place])   . REM sleep behavior disorder 12/07/2013  . Incisional hernia, without obstruction or gangrene 11/14/2012  . Adenocarcinoma of cecum ([place]) 03/19/2012  . GERD (gastroesophageal reflux disease) 03/19/2012  . Sleep apnea   . Essential hypertension     Past Surgical History:  Procedure Laterality Date  . CARDIAC CATHETERIZATION  2000's   "tx'd w/acid reflux RX" (12/22/2012)  . CHOLECYSTECTOMY  2011  . ESOPHAGEAL DILATION  1990's?  Marland Kitchen HERNIA REPAIR  12/22/2012   IHR w/mesh  . INGUINAL HERNIA REPAIR Left [id number]  . INSERTION OF MESH N/A 12/22/2012   Procedure: INSERTION OF MESH;  Surgeon: Harl Bowie, MD;  Location: [facility];  Service: General;  Laterality: N/A;  . LITHOTRIPSY  2008   x2  . PARTIAL COLECTOMY  03/21/2012   Procedure: PARTIAL COLECTOMY;  Surgeon: Harl Bowie, MD;  Location: [facility];  Service: General;  Laterality: Right;  . PILONIDAL CYST EXCISION  1960's?  Marland Kitchen SKIN CANCER EXCISION     "face; ?left ear" (12/22/2012)  . TONSILLECTOMY AND ADENOIDECTOMY  1946  . TOTAL HIP ARTHROPLASTY Left ~ 2002  . UPPER GASTROINTESTINAL ENDOSCOPY    . VENTRAL HERNIA REPAIR N/A 12/22/2012   Procedure: HERNIA REPAIR INCISIONAL ;  Surgeon: Harl Bowie, MD;  Location: [facility];  Service: General;  Laterality: N/A;  Home Medications    Prior to Admission medications   Medication Sig Start Date End Date Taking? Authorizing Provider  acetaminophen (TYLENOL) 500 MG tablet Take 2 tablets (1,000 mg total) by mouth 4 (four) times daily as needed (for back pain). 12/21/16  Yes Owens Loffler, MD  ALPRAZolam Duanne Moron) 0.5 MG tablet Take 1 tablet (0.5 mg total) by mouth at bedtime as needed for anxiety. 10/16/16  Yes Asencion Partridge Dohmeier, MD  donepezil (ARICEPT) 10 MG tablet take 1 tablet by mouth once daily 11/09/16  Yes Larey Seat, MD  FLUoxetine (PROZAC) 20 MG tablet Take 1  tablet (20 mg total) by mouth daily. 11/09/16  Yes [facility] Loffler, MD  hydrOXYzine (ATARAX/VISTARIL) 10 MG tablet take 1 tablet by mouth at bedtime 12/07/16  Yes Larey Seat, MD  triamterene-hydrochlorothiazide [facility]) 37.5-25 MG tablet take 1/2 tablet by mouth once daily 10/29/16  Yes Spencer Copland, MD  clonazePAM (KLONOPIN) 0.5 MG tablet Take 0.5 tablets (0.25 mg total) by mouth at bedtime. Patient not taking: Reported on 01/06/2017 12/04/16   Larey Seat, MD    Family History Family History  Problem Relation Age of Onset  . Colon cancer Mother   . Heart disease Mother   . Hypertension Mother   . Alcohol abuse Father   . Esophageal cancer Neg Hx   . Rectal cancer Neg Hx   . Stomach cancer Neg Hx     Social History Social History  Substance Use Topics  . Smoking status: Former Smoker    Types: Pipe  . Smokeless tobacco: Never Used     Comment: i4/06/2013 "only smoked pipe n college 1958"  . Alcohol use No     Allergies   Aspirin and Chicken allergy   Review of Systems Review of Systems  Cardiovascular: Positive for near-syncope.  Gastrointestinal: Positive for abdominal pain and diarrhea.  Neurological: Positive for dizziness.  All other systems reviewed and are negative.    Physical Exam Updated Vital Signs BP 99/68 (BP Location: Right Arm)   Pulse 73   Temp 97.9 F (36.6 C) (Oral)   Resp 16   Wt 210 lb (95.3 kg)   SpO2 98%   BMI 30.13 kg/m   Physical Exam  Constitutional: He is oriented to person, place, and time.  Chronically ill, slightly dehydrated   HENT:  Head: Normocephalic.  MM slightly dry   Eyes: EOM are normal. Pupils are equal, round, and reactive to light.  Neck: Normal range of motion. Neck supple.  Cardiovascular: Normal rate, regular rhythm and normal heart sounds.   Pulmonary/Chest: Effort normal and breath sounds normal. No respiratory distress. He has no wheezes. He has no rales.  Abdominal: Soft. Bowel sounds are  normal.  Mild epigastric tenderness   Musculoskeletal: Normal range of motion.  Neurological: He is alert and oriented to person, place, and time.  CN 2-12 intact. Nl strength throughout.   Skin: Skin is warm.  Psychiatric: He has a normal mood and affect.  Nursing note and vitals reviewed.    ED Treatments / Results  Labs (all labs ordered are listed, but only abnormal results are displayed) Labs Reviewed  CBC WITH DIFFERENTIAL/PLATELET - Abnormal; Notable for the following:       Result Value   WBC 3.6 (*)    RBC 3.50 (*)    Hemoglobin 12.9 (*)    HCT 36.6 (*)    MCV 104.4 (*)    MCH 37.0 (*)    Platelets 104 (*)    All  other components within normal limits  COMPREHENSIVE METABOLIC PANEL - Abnormal; Notable for the following:    Potassium 3.4 (*)    Glucose, Bld 115 (*)    BUN 32 (*)    Creatinine, Ser 1.68 (*)    ALT 13 (*)    GFR calc non Af Amer 36 (*)    GFR calc Af Amer 42 (*)    All other components within normal limits  LIPASE, BLOOD - Abnormal; Notable for the following:    Lipase 68 (*)    All other components within normal limits  TROPONIN I  URINALYSIS, COMPLETE (UACMP) WITH MICROSCOPIC    EKG  EKG Interpretation  Date/Time:  Wednesday January 06 2017 18:34:54 EDT Ventricular Rate:  63 PR Interval:    QRS Duration: 113 QT Interval:  479 QTC Calculation: 491 R Axis:   -46 Text Interpretation:  Sinus rhythm Atrial premature complexes in couplets Left anterior fascicular block Low voltage, precordial leads Abnormal R-wave progression, late transition Nonspecific repol abnormality, lateral leads Baseline wander in lead(s) V1 No significant change since last tracing Confirmed by YAO  MD, DAVID ([id number]) on 01/06/2017 6:40:09 PM       Radiology Ct Head Wo Contrast  Result Date: 01/06/2017 CLINICAL DATA:  Syncopal episode EXAM: CT HEAD WITHOUT CONTRAST TECHNIQUE: Contiguous axial images were obtained from the base of the skull through the vertex without  intravenous contrast. COMPARISON:  MRI 01/26/2014 FINDINGS: Brain: Mild-to-moderate superficial atrophy with chronic appearing small vessel ischemic disease of periventricular white matter. Idiopathic basal ganglial calcifications are noted bilaterally. No intracranial mass, hemorrhage or midline shift. No extra-axial fluid collections. Vascular: No hyperdense vessels. Mild carotid siphon calcifications bilaterally. Skull: Normal. Negative for fracture or focal lesion. Sinuses/Orbits: Normal bilateral orbits and globes. Minimal ethmoid sinus mucosal thickening. Mastoids are clear. Other: None IMPRESSION: Superficial atrophy. Chronic appearing small vessel ischemic disease of periventricular white matter. No acute intracranial abnormality. Electronically Signed   By: Ashley Royalty M.D.   On: 01/06/2017 21:45   Ct Abdomen Pelvis W Contrast  Result Date: 01/06/2017 CLINICAL DATA:  Syncopal episode.  Diarrhea for the last 2-3 days. EXAM: CT ABDOMEN AND PELVIS WITH CONTRAST TECHNIQUE: Multidetector CT imaging of the abdomen and pelvis was performed using the standard protocol following bolus administration of intravenous contrast. CONTRAST:  67mL ISOVUE-300 IOPAMIDOL (ISOVUE-300) INJECTION 61% COMPARISON:  04/24/2016 CT FINDINGS: Lower chest: Borderline cardiomegaly with coronary arteriosclerosis. No pericardial effusion. Dependent atelectasis at each lung base. Hepatobiliary: No focal liver abnormality is seen. Status post cholecystectomy. No biliary dilatation. Pancreas: Smaller pancreatic body cystic mass measuring 1.2 cm versus 2.3 cm previously. No significant ductal dilatation. No additional pancreatic mass or inflammation. Spleen: Normal in size without focal abnormality. Adrenals/Urinary Tract: Normal bilateral adrenal glands. No obstructive uropathy. Punctate calculus the interpolar left kidney. No solid enhancing renal mass. Urinary bladder is physiologically distended. Streak artifacts from the patient's  left hip arthroplasty slightly limit further assessment especially along the posterior dependent wall were there are subtle hyperdensities believed to be due to streak artifacts although the potential for tiny layering calculi is not entirely excluded. Stomach/Bowel: The stomach is distended with contrast and food. Normal small bowel rotation is seen. Stable small to moderate hiatal hernia. Large ventral abdominal wall defect is again seen containing small bowel loops unchanged in appearance. The patient is status post subtotal right hemicolectomy with intact appearing ileocolic anastomosis in the right upper quadrant. A moderate amount of fecal residue is seen within the remainder of  the large intestine. Mild hazy perirectal fat is likely due in part due to the streak artifacts from the hip arthroplasty and less likely due to proctitis. No significant wall thickening is noted. Vascular/Lymphatic: Atherosclerosis of the non aneurysmal abdominal aorta. 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 Patent portal, splenic, hepatic and renal veins. Reproductive: Stable enlarged prostate measuring 5.2 x 4.7 cm. Other:  No pneumoperitoneum, ascites or focal fluid. Musculoskeletal: No aggressive appearing focal osseous lesions. Left hip arthroplasty. Thoracolumbar spondylosis. IMPRESSION: 1. Stable small bowel and fat containing umbilical hernia. 2. Smaller pancreatic body cystic mass now estimated at 1.2 cm versus 2.3 cm previously. 3. Status post subtotal right hemicolectomy. No acute bowel inflammation. 4. Cardiomegaly with coronary arteriosclerosis. Aortic atherosclerosis. 5. Stable prostatomegaly. Electronically Signed   By: Ashley Royalty M.D.   On: 01/06/2017 21:43    Procedures Procedures (including critical care time)  Medications Ordered in ED Medications  sodium chloride 0.9 % bolus 1,000 mL (1,000 mLs Intravenous New Bag/Given 01/06/17 1857)  iopamidol (ISOVUE-300) 61 % injection 30 mL (30 mLs Oral  Contrast Given 01/06/17 1934)  iopamidol (ISOVUE-300) 61 % injection 75 mL (75 mLs Intravenous Contrast Given 01/06/17 2045)     Initial Impression / Assessment and Plan / ED Course  I have reviewed the triage vital signs and the nursing notes.  Pertinent labs & imaging results that were available during my care of the patient were reviewed by me and considered in my medical decision making (see chart for details).     KORBAN SHEARER is a 83 y.o. male here with dizziness, near syncope. Likely from dehydration from gastro. However, patient has hx of pancreatic mass so will get CT to see if it has progressed. Will get labs, trop, EKG, CT ab/pel.   10:00 PM BP still upper 90s. Cr. 1.7, baseline around 1. CT showed that mass has decreased in size. Ct head unremarkable. Will give second NS bolus. Will admit for dehydration, acute renal failure.    Final Clinical Impressions(s) / ED Diagnoses   Final diagnoses:  Abdominal pain    New Prescriptions New Prescriptions   No medications on file     Drenda Freeze, MD 01/06/17 2201

## 2017-01-06 NOTE — H&P (Addendum)
History and Physical   [facility] - [place] Admission History and Physical [employer], D.O.    Patient Name: John Summers MR#: [id number] Date of Birth: [birth year] Date of Admission: 01/06/2017  Referring MD/NP/PA: Darl Householder Primary Care Physician: Owens Loffler, MD Patient coming from: Home Outpatient Specialists: Neurology, oncology, GI   Chief Complaint:  Chief Complaint  Patient presents with  . Near Syncope    HPI: John Summers is a 83 y.o. male with a known history of Lewy body dementia, GERD, hypertension, ITP, major depression, pancreatitis, history of colon cancer presents to the emergency department for evaluation of near syncope.  Patient lives in an assisted living where he states that he has had one week of intermittent diarrhea, decreased appetite and by mouth intake for the past 2-3 days. He was having dinner at a restaurant with his family when he stood up to leave the restaurant he had a period of alteration in his mental status. His daughter states that he was awake and standing but was not communicating, just stared ahead and did not respond for several seconds. He came to spontaneously. He did not lose consciousness, he did not collapse and there was no seizure-like activity. Patient does not recall the events of this evening.  Patient denies fevers/chills, weakness, dizziness, chest pain, shortness of breath, N/V/C/D, abdominal pain, dysuria/frequency.   Of note patient is being followed by GI for a known pancreatic mass. He also follows with neurology for Lewy body dementia and oncology for thrombocytopenia/ITP. Otherwise there has been no change in status. Patient has been taking medication as prescribed and there has been no recent change in medication or diet.  No recent antibiotics.    EMS/ED Course: Patient received 2 L normal saline bolus.  Review of Systems:  CONSTITUTIONAL: No fever/chills, fatigue, weakness, weight gain/loss,  headache. EYES: No blurry or double vision. ENT: No tinnitus, postnasal drip, redness or soreness of the oropharynx. RESPIRATORY: No cough, dyspnea, wheeze.  No hemoptysis.  CARDIOVASCULAR: No chest pain, palpitations, syncope, orthopnea. No lower extremity edema.  GASTROINTESTINAL: Positive diarrhea, decreased appetite. No nausea, vomiting, abdominal pain,  constipation.  No hematemesis, melena or hematochezia. GENITOURINARY: No dysuria, frequency, hematuria. ENDOCRINE: No polyuria or nocturia. No heat or cold intolerance. HEMATOLOGY: No anemia, bruising, bleeding. INTEGUMENTARY: No rashes, ulcers, lesions. MUSCULOSKELETAL: No arthritis, gout, dyspnea. NEUROLOGIC: No numbness, tingling, ataxia, seizure-type activity. Positive weakness and change in mental status as described above PSYCHIATRIC: No anxiety, depression, insomnia.   Past Medical History:  Diagnosis Date  . Colon adenocarcinoma ([place])   . Complication of anesthesia    severe confusion  . GERD (gastroesophageal reflux disease)   . History of colon polyps   . History of gallstones   . Hx of transfusion of platelets   . Hypertension   . ITP (idiopathic thrombocytopenic purpura)    Dr. Grayland Ormond follows  . Kidney stones   . Lewy body dementia with behavioral disturbance   . Major depressive disorder, recurrent episode, in partial remission with anxious distress ([place])   . Pancreatitis 2/11  . Pollen allergies   . Skin cancer    ears  . Sleep apnea    "won't wear mask anymore" (12/22/2012)  . Unspecified essential hypertension     Past Surgical History:  Procedure Laterality Date  . CARDIAC CATHETERIZATION  2000's   "tx'd w/acid reflux RX" (12/22/2012)  . CHOLECYSTECTOMY  2011  . ESOPHAGEAL DILATION  1990's?  Marland Kitchen HERNIA REPAIR  12/22/2012   IHR  w/mesh  . INGUINAL HERNIA REPAIR Left [id number]  . INSERTION OF MESH N/A 12/22/2012   Procedure: INSERTION OF MESH;  Surgeon: Harl Bowie, MD;  Location: [facility];  Service:  General;  Laterality: N/A;  . LITHOTRIPSY  2008   x2  . PARTIAL COLECTOMY  03/21/2012   Procedure: PARTIAL COLECTOMY;  Surgeon: Harl Bowie, MD;  Location: [facility];  Service: General;  Laterality: Right;  . PILONIDAL CYST EXCISION  1960's?  Marland Kitchen SKIN CANCER EXCISION     "face; ?left ear" (12/22/2012)  . TONSILLECTOMY AND ADENOIDECTOMY  1946  . TOTAL HIP ARTHROPLASTY Left ~ 2002  . UPPER GASTROINTESTINAL ENDOSCOPY    . VENTRAL HERNIA REPAIR N/A 12/22/2012   Procedure: HERNIA REPAIR INCISIONAL ;  Surgeon: Harl Bowie, MD;  Location: [facility];  Service: General;  Laterality: N/A;     reports that he has quit smoking. His smoking use included Pipe. He has never used smokeless tobacco. He reports that he does not drink alcohol or use drugs.  Allergies  Allergen Reactions  . Aspirin   . Chicken Allergy Nausea And Vomiting    unknown    Family History  Problem Relation Age of Onset  . Colon cancer Mother   . Heart disease Mother   . Hypertension Mother   . Alcohol abuse Father   . Esophageal cancer Neg Hx   . Rectal cancer Neg Hx   . Stomach cancer Neg Hx     Prior to Admission medications   Medication Sig Start Date End Date Taking? Authorizing Provider  acetaminophen (TYLENOL) 500 MG tablet Take 2 tablets (1,000 mg total) by mouth 4 (four) times daily as needed (for back pain). 12/21/16  Yes Owens Loffler, MD  ALPRAZolam Duanne Moron) 0.5 MG tablet Take 1 tablet (0.5 mg total) by mouth at bedtime as needed for anxiety. 10/16/16  Yes Asencion Partridge Dohmeier, MD  donepezil (ARICEPT) 10 MG tablet take 1 tablet by mouth once daily 11/09/16  Yes Larey Seat, MD  FLUoxetine (PROZAC) 20 MG tablet Take 1 tablet (20 mg total) by mouth daily. 11/09/16  Yes Owens Loffler, MD  hydrOXYzine (ATARAX/VISTARIL) 10 MG tablet take 1 tablet by mouth at bedtime 12/07/16  Yes Larey Seat, MD  triamterene-hydrochlorothiazide [facility]) 37.5-25 MG tablet take 1/2 tablet by mouth once daily 10/29/16  Yes  Spencer Copland, MD  clonazePAM (KLONOPIN) 0.5 MG tablet Take 0.5 tablets (0.25 mg total) by mouth at bedtime. Patient not taking: Reported on 01/06/2017 12/04/16   Larey Seat, MD    Physical Exam: Vitals:   01/06/17 1830 01/06/17 1835  BP:  99/68  Pulse:  73  Resp:  16  Temp:  97.9 F (36.6 C)  TempSrc:  Oral  SpO2:  98%  Weight: 95.3 kg (210 lb)     GENERAL: 83 y.o.-year-old White male patient, well-developed, well-nourished lying in the bed in no acute distress.  Pleasant and cooperative.  Intermittently confused. HEENT: Head atraumatic, normocephalic. Pupils equal, round, reactive to light and accommodation. No scleral icterus. Extraocular muscles intact. Nares are patent. Oropharynx is clear. Mucus membranes dry. NECK: Supple, full range of motion. No JVD, no bruit heard. No thyroid enlargement, no tenderness, no cervical lymphadenopathy. CHEST: Normal breath sounds bilaterally. No wheezing, rales, rhonchi or crackles. No use of accessory muscles of respiration.  No reproducible chest wall tenderness.  CARDIOVASCULAR: S1, S2 normal. No murmurs, rubs, or gallops. Cap refill <2 seconds. Pulses intact distally.  ABDOMEN: Soft, nondistended, positive midline epigastric tenderness No  rebound, guarding, rigidity. Normoactive bowel sounds present in all four quadrants. No organomegaly or mass. EXTREMITIES: No pedal edema, cyanosis, or clubbing. No calf tenderness or Homan's sign.  NEUROLOGIC: The patient is alert and oriented x 3. Hard of hearing. Cranial nerves II through XII are grossly intact with no focal sensorimotor deficit. Muscle strength 5/5 in all extremities. Sensation intact. Gait not checked. SKIN: Warm, dry, and intact without obvious rash, lesion, or ulcer.    Labs on Admission:  CBC:  Recent Labs Lab 01/06/17 1852  WBC 3.6*  NEUTROABS 1.4  HGB 12.9*  HCT 36.6*  MCV 104.4*  PLT 884*   Basic Metabolic Panel:  Recent Labs Lab 01/06/17 1852  NA 138  K  3.4*  CL 103  CO2 27  GLUCOSE 115*  BUN 32*  CREATININE 1.68*  CALCIUM 9.2   GFR: Estimated Creatinine Clearance: 39.3 mL/min (A) (by C-G formula based on SCr of 1.68 mg/dL (H)). Liver Function Tests:  Recent Labs Lab 01/06/17 1852  AST 28  ALT 13*  ALKPHOS 47  BILITOT 0.9  PROT 7.9  ALBUMIN 4.2    Recent Labs Lab 01/06/17 1852  LIPASE 68*   No results for input(s): AMMONIA in the last 168 hours. Coagulation Profile: No results for input(s): INR, PROTIME in the last 168 hours. Cardiac Enzymes:  Recent Labs Lab 01/06/17 1852  TROPONINI <0.03   BNP (last 3 results) No results for input(s): PROBNP in the last 8760 hours. HbA1C: No results for input(s): HGBA1C in the last 72 hours. CBG: No results for input(s): GLUCAP in the last 168 hours. Lipid Profile: No results for input(s): CHOL, HDL, LDLCALC, TRIG, CHOLHDL, LDLDIRECT in the last 72 hours. Thyroid Function Tests: No results for input(s): TSH, T4TOTAL, FREET4, T3FREE, THYROIDAB in the last 72 hours. Anemia Panel: No results for input(s): VITAMINB12, FOLATE, FERRITIN, TIBC, IRON, RETICCTPCT in the last 72 hours. Urine analysis:    Component Value Date/Time   COLORURINE YELLOW (A) 01/06/2017 1852   APPEARANCEUR CLEAR (A) 01/06/2017 1852   LABSPEC 1.018 01/06/2017 1852   PHURINE 5.0 01/06/2017 1852   GLUCOSEU NEGATIVE 01/06/2017 1852   HGBUR NEGATIVE 01/06/2017 1852   BILIRUBINUR NEGATIVE 01/06/2017 1852   KETONESUR NEGATIVE 01/06/2017 1852   PROTEINUR NEGATIVE 01/06/2017 1852   UROBILINOGEN 0.2 03/23/2012 0545   NITRITE NEGATIVE 01/06/2017 1852   LEUKOCYTESUR NEGATIVE 01/06/2017 1852   Sepsis Labs: @LABRCNTIP (procalcitonin:4,lacticidven:4) )No results found for this or any previous visit (from the past 240 hour(s)).   Radiological Exams on Admission: Ct Head Wo Contrast  Result Date: 01/06/2017 CLINICAL DATA:  Syncopal episode EXAM: CT HEAD WITHOUT CONTRAST TECHNIQUE: Contiguous axial images  were obtained from the base of the skull through the vertex without intravenous contrast. COMPARISON:  MRI 01/26/2014 FINDINGS: Brain: Mild-to-moderate superficial atrophy with chronic appearing small vessel ischemic disease of periventricular white matter. Idiopathic basal ganglial calcifications are noted bilaterally. No intracranial mass, hemorrhage or midline shift. No extra-axial fluid collections. Vascular: No hyperdense vessels. Mild carotid siphon calcifications bilaterally. Skull: Normal. Negative for fracture or focal lesion. Sinuses/Orbits: Normal bilateral orbits and globes. Minimal ethmoid sinus mucosal thickening. Mastoids are clear. Other: None IMPRESSION: Superficial atrophy. Chronic appearing small vessel ischemic disease of periventricular white matter. No acute intracranial abnormality. Electronically Signed   By: Ashley Royalty M.D.   On: 01/06/2017 21:45   Ct Abdomen Pelvis W Contrast  Result Date: 01/06/2017 CLINICAL DATA:  Syncopal episode.  Diarrhea for the last 2-3 days. EXAM: CT ABDOMEN AND PELVIS  WITH CONTRAST TECHNIQUE: Multidetector CT imaging of the abdomen and pelvis was performed using the standard protocol following bolus administration of intravenous contrast. CONTRAST:  25mL ISOVUE-300 IOPAMIDOL (ISOVUE-300) INJECTION 61% COMPARISON:  04/24/2016 CT FINDINGS: Lower chest: Borderline cardiomegaly with coronary arteriosclerosis. No pericardial effusion. Dependent atelectasis at each lung base. Hepatobiliary: No focal liver abnormality is seen. Status post cholecystectomy. No biliary dilatation. Pancreas: Smaller pancreatic body cystic mass measuring 1.2 cm versus 2.3 cm previously. No significant ductal dilatation. No additional pancreatic mass or inflammation. Spleen: Normal in size without focal abnormality. Adrenals/Urinary Tract: Normal bilateral adrenal glands. No obstructive uropathy. Punctate calculus the interpolar left kidney. No solid enhancing renal mass. Urinary bladder  is physiologically distended. Streak artifacts from the patient's left hip arthroplasty slightly limit further assessment especially along the posterior dependent wall were there are subtle hyperdensities believed to be due to streak artifacts although the potential for tiny layering calculi is not entirely excluded. Stomach/Bowel: The stomach is distended with contrast and food. Normal small bowel rotation is seen. Stable small to moderate hiatal hernia. Large ventral abdominal wall defect is again seen containing small bowel loops unchanged in appearance. The patient is status post subtotal right hemicolectomy with intact appearing ileocolic anastomosis in the right upper quadrant. A moderate amount of fecal residue is seen within the remainder of the large intestine. Mild hazy perirectal fat is likely due in part due to the streak artifacts from the hip arthroplasty and less likely due to proctitis. No significant wall thickening is noted. Vascular/Lymphatic: Atherosclerosis of the non aneurysmal abdominal aorta. 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 Patent portal, splenic, hepatic and renal veins. Reproductive: Stable enlarged prostate measuring 5.2 x 4.7 cm. Other:  No pneumoperitoneum, ascites or focal fluid. Musculoskeletal: No aggressive appearing focal osseous lesions. Left hip arthroplasty. Thoracolumbar spondylosis. IMPRESSION: 1. Stable small bowel and fat containing umbilical hernia. 2. Smaller pancreatic body cystic mass now estimated at 1.2 cm versus 2.3 cm previously. 3. Status post subtotal right hemicolectomy. No acute bowel inflammation. 4. Cardiomegaly with coronary arteriosclerosis. Aortic atherosclerosis. 5. Stable prostatomegaly. Electronically Signed   By: Ashley Royalty M.D.   On: 01/06/2017 21:43    EKG: Normal sinus rhythm at 63 bpm with normal axis and left anterior hemiblock and nonspecific ST-T wave changes.   Assessment/Plan  This is a 83 y.o. male with a history of  Lewy body dementia, GERD, hypertension, ITP, major depression, pancreatitis, history of colon cancer  now being admitted with:  #. Near syncope, possibly orthostatic, dehydration. Possibly secondary to recent diarrhea - Admit observation with telemetry monitoring - IV fluid hydration - Check orthostatics - Check echo, carotids - Trend trops, check TSH, lipids -Hold Xanax, Klonopin  #. Diarrhea, abdominal pain. Known pancreatic mass which is decreased in size -Check stool studies to rule out C. difficile given her risk factor of assisted living  #. Acute kidney injury  - IV fluids and repeat BMP in AM.  - Avoid nephrotoxic medications: Hold Maxzide - Bladder scan and place foley catheter if evidence of urinary retention  #. History of thrombocytopenia/ITP/anemia, chronic and stable - Continue to monitor CBC -Lovenox ordered for DVT prophylaxis but consider discontinuing if platelets drop further  #. History of dementia - Continue Aricept  Admission status: Observation, telemetry IV Fluids: Normal saline Diet/Nutrition: Heart healthy Consults called: None  DVT Px: Lovenox, SCDs and early ambulation. Code Status: DNR, confirmed by patient. Daughter to bring living will.  Disposition Plan: To home in  1 days  All the records are reviewed and case discussed with ED provider. Management plans discussed with the patient and/or family who express understanding and agree with plan of care.  Wilmar Prabhakar D.O. on 01/06/2017 at 11:38 PM Between 7am to 6pm - Pager - [phone] After 6pm go to [email or web address] - [employer] [facility] Office [phone] CC: Primary care physician; Owens Loffler, MD   01/06/2017, 11:38 PM

## 2017-01-07 ENCOUNTER — Observation Stay (HOSPITAL_BASED_OUTPATIENT_CLINIC_OR_DEPARTMENT_OTHER)
Admit: 2017-01-07 | Discharge: 2017-01-07 | Disposition: A | Discharge status: Home / Self Care | Payer: Medicare Other | Attending: Family Medicine | Admitting: Family Medicine

## 2017-01-07 ENCOUNTER — Observation Stay: Payer: Medicare Other

## 2017-01-07 DIAGNOSIS — R55 Syncope and collapse: Secondary | ICD-10-CM

## 2017-01-07 LAB — TROPONIN I

## 2017-01-07 LAB — MRSA PCR SCREENING: MRSA BY PCR: NEGATIVE

## 2017-01-07 LAB — BASIC METABOLIC PANEL
Anion gap: 9 (ref 5–15)
BUN: 31 mg/dL — AB (ref 6–20)
CALCIUM: 8.4 mg/dL — AB (ref 8.9–10.3)
CO2: 28 mmol/L (ref 22–32)
CREATININE: 1.23 mg/dL (ref 0.61–1.24)
Chloride: 103 mmol/L (ref 101–111)
GFR calc non Af Amer: 53 mL/min — ABNORMAL LOW (ref >60)
Glucose, Bld: 101 mg/dL — ABNORMAL HIGH (ref 65–99)
Potassium: 3.2 mmol/L — ABNORMAL LOW (ref 3.5–5.1)
SODIUM: 140 mmol/L (ref 135–145)

## 2017-01-07 LAB — ECHOCARDIOGRAM COMPLETE
Height: 65 in
WEIGHTICAEL: 2646.4 [oz_av]

## 2017-01-07 LAB — PHOSPHORUS: PHOSPHORUS: 3.3 mg/dL (ref 2.5–4.6)

## 2017-01-07 LAB — CBC
HCT: 34.6 % — ABNORMAL LOW (ref 40.0–52.0)
Hemoglobin: 12.2 g/dL — ABNORMAL LOW (ref 13.0–18.0)
MCH: 37 pg — AB (ref 26.0–34.0)
MCHC: 35.2 g/dL (ref 32.0–36.0)
MCV: 105.1 fL — AB (ref 80.0–100.0)
PLATELETS: 101 10*3/uL — AB (ref 150–440)
RBC: 3.29 MIL/uL — ABNORMAL LOW (ref 4.40–5.90)
RDW: 12.6 % (ref 11.5–14.5)
WBC: 3.4 10*3/uL — ABNORMAL LOW (ref 3.8–10.6)

## 2017-01-07 LAB — GLUCOSE, CAPILLARY: GLUCOSE-CAPILLARY: 89 mg/dL (ref 65–99)

## 2017-01-07 LAB — TSH: TSH: 1.691 u[IU]/mL (ref 0.350–4.500)

## 2017-01-07 LAB — MAGNESIUM: MAGNESIUM: 1.9 mg/dL (ref 1.7–2.4)

## 2017-01-07 MED ORDER — SODIUM CHLORIDE 0.9 % IV SOLN
INTRAVENOUS | Status: DC
  Administered 2017-01-07: 01:00:00 via INTRAVENOUS

## 2017-01-07 MED ORDER — FLUOXETINE HCL 20 MG PO TABS
20.0000 mg | ORAL_TABLET | Freq: Every day | ORAL | Status: DC
  Filled 2017-01-07: qty 1

## 2017-01-07 MED ORDER — TRIAMTERENE-HCTZ 37.5-25 MG PO TABS
0.5000 | ORAL_TABLET | Freq: Every day | ORAL | Status: DC
  Administered 2017-01-07: 09:00:00 0.5 via ORAL
  Filled 2017-01-07: qty 0.5

## 2017-01-07 MED ORDER — SODIUM CHLORIDE 0.9% FLUSH
3.0000 mL | Freq: Two times a day (BID) | INTRAVENOUS | Status: DC
  Administered 2017-01-07: 3 mL via INTRAVENOUS

## 2017-01-07 MED ORDER — SENNOSIDES-DOCUSATE SODIUM 8.6-50 MG PO TABS: 1.0000 | ORAL_TABLET | Freq: Every evening | ORAL | Status: DC | PRN

## 2017-01-07 MED ORDER — DONEPEZIL HCL 5 MG PO TABS
10.0000 mg | ORAL_TABLET | Freq: Every day | ORAL | Status: DC
  Administered 2017-01-07: 10 mg via ORAL
  Filled 2017-01-07: qty 2

## 2017-01-07 MED ORDER — BISACODYL 5 MG PO TBEC: 5.0000 mg | DELAYED_RELEASE_TABLET | Freq: Every day | ORAL | Status: DC | PRN

## 2017-01-07 MED ORDER — ONDANSETRON HCL 4 MG PO TABS: 4.0000 mg | ORAL_TABLET | Freq: Four times a day (QID) | ORAL | Status: DC | PRN

## 2017-01-07 MED ORDER — ONDANSETRON HCL 4 MG/2ML IJ SOLN: 4.0000 mg | Freq: Four times a day (QID) | INTRAMUSCULAR | Status: DC | PRN

## 2017-01-07 MED ORDER — POTASSIUM CHLORIDE CRYS ER 20 MEQ PO TBCR: 40.0000 meq | EXTENDED_RELEASE_TABLET | Freq: Once | ORAL | Status: DC

## 2017-01-07 MED ORDER — FLUOXETINE HCL 20 MG PO CAPS
20.0000 mg | ORAL_CAPSULE | Freq: Every day | ORAL | Status: DC
  Administered 2017-01-07: 09:00:00 20 mg via ORAL
  Filled 2017-01-07: qty 1

## 2017-01-07 MED ORDER — HYDROXYZINE HCL 10 MG PO TABS
10.0000 mg | ORAL_TABLET | Freq: Every day | ORAL | Status: DC
  Administered 2017-01-07: 10 mg via ORAL
  Filled 2017-01-07 (×2): qty 1

## 2017-01-07 MED ORDER — MAGNESIUM CITRATE PO SOLN
1.0000 | Freq: Once | ORAL | Status: DC | PRN
  Filled 2017-01-07: qty 296

## 2017-01-07 MED ORDER — ACETAMINOPHEN 500 MG PO TABS: 1000.0000 mg | ORAL_TABLET | Freq: Four times a day (QID) | ORAL | Status: DC | PRN

## 2017-01-07 MED ORDER — ENOXAPARIN SODIUM 40 MG/0.4ML ~~LOC~~ SOLN
40.0000 mg | SUBCUTANEOUS | Status: DC
  Administered 2017-01-07: 01:00:00 40 mg via SUBCUTANEOUS
  Filled 2017-01-07: qty 0.4

## 2017-01-07 NOTE — Care Management Obs Status (Signed)
[facility] NOTIFICATION   Patient Details  Name: John Summers MRN: [id number] Date of Birth: Jan 31, 1933   Medicare Observation Status Notification Given:  Yes    Shelbie Ammons, RN 01/07/2017, 10:50 AM

## 2017-01-07 NOTE — Clinical Social Work Note (Signed)
Pt is ready for discharge today and will return to [facility]. Facility has recieved discharge information and is ready to accept pt. Pt and pt's stepson are aware and agreeable to discharge plan. Pt's stepdaughter will provide transportation to facility. RN is aware. CSW is signing off as no further needs identified.   Darden Dates, MSW, LCSW  Clinical Social Worker  [phone]

## 2017-01-07 NOTE — Progress Notes (Signed)
Initial Nutrition Assessment  DOCUMENTATION CODES:   Severe malnutrition in context of social or environmental circumstances  INTERVENTION:  Recommend Carnation Breakfast Essentials in whole milk TID, each supplement provides 280 kcal, 13 grams of protein. Discussed recommendation with patient and son.  Recommend multivitamin with minerals daily.  Encouraged adequate intake of calories and protein at meal. Encouraged patient to go to every lunch and dinner meal offered and to eat as much as he is able to tolerate. Also discussed breakfast ideas with more calories/protein.  NUTRITION DIAGNOSIS:   Malnutrition (Severe) related to social / environmental circumstances (dementia, lives at ALF now) as evidenced by percent weight loss, moderate depletions of muscle mass, severe depletion of muscle mass, moderate depletion of body fat, severe depletion of body fat.  GOAL:   Patient will meet greater than or equal to 90% of their needs  MONITOR:   PO intake, Supplement acceptance, Labs, Weight trends, I & O's  REASON FOR ASSESSMENT:   Malnutrition Screening Tool    ASSESSMENT:   83 year old male with PMHx of GERD, HTN, MDD, lewy body dementia with behavioral disturbance, history of stage IIa adenocarcinoma of the cecum s/p partial colectomy 03/21/2012, pancreatitis who presents from [facility] for evaluation of near syncope.   Spoke with patient and son at bedside. Patient has been at the ALF for approximately 3 weeks. He used to live at home with his wife who recently passed away. He does not always like the food offered at the ALF. He reports his appetite has been especially poor this past week in setting of diarrhea, nausea, and abdominal pain. He reports that for breakfast he eats a bagel or banana in his room instead of going to the cafeteria. He tries to eat lunch and dinner in the cafeteria, but reports he does not make it to every meal. He cannot recall how much of his meals  he finishes. Patient reports he is allergic to [place] and chicken and they cause GI upset. He occasionally drinks Ensure or Boost, but does not like these very much.  UBW 220 lbs. Per chart patient was 219.5 lbs on 05/04/2016 and has lost 54.1 lbs (24.6% body weight) over 8 months, which is significant for time frame. Son reports they knew patient had lost a lot of weight, but they did not realize it was this much weight loss.  Medications reviewed and include: potassium chloride 40 mEq once, NS @ 75 ml/hr.  Labs reviewed: Potassium 3.2, Bun 31.   Nutrition-Focused physical exam completed. Findings are moderate-severe fat depletion, moderate-severe muscle depletion, and no edema.   Diet Order:  Diet Heart Room service appropriate? Yes; Fluid consistency: Thin  Skin:  Reviewed, no issues  Last BM:  01/07/2017 - type 2 per chart  Height:   Ht Readings from Last 1 Encounters:  01/07/17 5\' 5"  (1.651 m)    Weight:   Wt Readings from Last 1 Encounters:  01/07/17 165 lb 6.4 oz (75 kg)    Ideal Body Weight:  61.8 kg  BMI:  Body mass index is 27.52 kg/m.  Estimated Nutritional Needs:   Kcal:  1660-1940 (MSJ x 1.2-1.4)  Protein:  90-105 grams (1.2-1.4 grams/kg)  Fluid:  1.8 L/day (25 ml/kg)  EDUCATION NEEDS:   No education needs identified at this time  Willey Blade, MS, RD, LDN Pager: [phone] After Hours Pager: [phone]

## 2017-01-07 NOTE — Care Management (Signed)
Admitted to this facility with the diagnosis of near syncope under observation status. Lives at [facility] in [place] x 3 weeks. Daughter is Edd Arbour  [phone]). Hortencia Pilar [phone]). Sees Dr, Lorelei Pont as primary care physician. Prescriptions were filled at [facility] in [place] until moving to [employer] [place]. Followed by [facility] at present. No skilled facility. No home oxygen. Self feeds, needs help with baths and dressing. Wheelchair and rolling walker in the home. Fell last Thursday. Decreased appetite x 1 year,. 60 pound weight loss x 1 year.  Physical therapy evaluation pending [facility] [phone]

## 2017-01-07 NOTE — Progress Notes (Signed)
*  PRELIMINARY RESULTS* Echocardiogram 2D Echocardiogram has been performed.  Sherrie Sport 01/07/2017, 2:50 PM

## 2017-01-07 NOTE — Progress Notes (Signed)
Patient discharged to ALF. FL2 included in discharge packet. Discharge instructions also explained to patient's step-daughter. IV removed with no complications or discomfort. Step-daughter providing transportation.

## 2017-01-07 NOTE — Clinical Social Work Note (Signed)
Clinical Social Work Assessment  Patient Details  Name: John Summers MRN: [id number] Date of Birth: [birth year]  Date of referral:  01/07/17               Reason for consult:  Facility Placement, Discharge Planning                Permission sought to share information with:  Family Supports Permission granted to share information::  Yes, Verbal Permission Granted  Name::     John Summers  Relationship::  stepdaughter  Contact Information:  [phone]  Housing/Transportation Living arrangements for the past 2 months:  [place] of Information:  Adult Children Patient Interpreter Needed:  None Criminal Activity/Legal Involvement Pertinent to Current Situation/Hospitalization:  No - Comment as needed Significant Relationships:  Adult Children Lives with:  Facility Resident Do you feel safe going back to the place where you live?  Yes Need for family participation in patient care:  Yes (Comment)  Care giving concerns:  No care giving concerns identified.   Social Worker assessment / plan:  CSW met with pt and stepson, John Summers, to address consult. CSW introduced herself and explained role of social work. Pt was admitted from [facility] in [place] where he has been living for about a month. Pt is receiving home health services through [facility] in [place]. Pt is able to return, and pt's stepson is agreeable. Pt's family will provide transportation to facility.   CSW attempted to reach pt's stepdaughter, John Summers, who is POA. CSW left a message requesting a return phone call.   CSW will continue to follow.   Employment status:  Retired Nurse, adult PT Recommendations:  Not assessed at this time Information / Referral to community resources:  Other (Comment Required) [facility])  Patient/Family's Response to care:  Pt and stepson were appreciative of CSW support.   Patient/Family's Understanding of and Emotional  Response to Diagnosis, Current Treatment, and Prognosis:  Pt and stepson understand that pt will return to facility when stable.   Emotional Assessment Appearance:  Appears stated age Attitude/Demeanor/Rapport:   Appropriate Affect (typically observed):  Accepting Orientation:  Oriented to Place, Oriented to Situation, Oriented to  Time, Oriented to Self Alcohol / Substance use:  Not Applicable Psych involvement (Current and /or in the community):  No (Comment)  Discharge Needs  Concerns to be addressed:  Adjustment to Illness Readmission within the last 30 days:  No Current discharge risk:  Chronically ill Barriers to Discharge:  Continued Medical Work up   [employer], LCSW 01/07/2017, 12:10 PM

## 2017-01-07 NOTE — NC FL2 (Addendum)
[place] LEVEL OF CARE SCREENING TOOL     IDENTIFICATION  Patient Name: John Summers Birthdate: Apr 17, 1933 Sex: male Admission Date (Current Location): 01/06/2017  [address] and [place] Number:  Engineering geologist and Address:  [facility], [address], [place], [place] [address]      Provider Number: [id number]  Attending Physician Name and Address:  Fritzi Mandes, MD  Relative Name and Phone Number:       Current Level of Care: Hospital Recommended Level of Care: [place] Prior Approval Number:    Date Approved/Denied:   PASRR Number:    Discharge Plan: Domiciliary (Rest home)    Current Diagnoses: Patient Active Problem List   Diagnosis Date Noted  . Near syncope 01/06/2017  . Gait disturbance 12/30/2016  . MGUS (monoclonal gammopathy of unknown significance) 05/10/2016  . Pancreatic mass 04/24/2016  . Thrombocytopenia ([place]) 10/04/2015  . Uncontrolled REM sleep behavior disorder 11/16/2014  . Lewy body dementia with behavioral disturbance 11/16/2014  . Tremor of right hand 11/16/2014  . Shuffling gait 11/16/2014  . Major depressive disorder, recurrent episode, in partial remission with anxious distress ([place])   . REM sleep behavior disorder 12/07/2013  . Incisional hernia, without obstruction or gangrene 11/14/2012  . Adenocarcinoma of cecum ([place]) 03/19/2012  . GERD (gastroesophageal reflux disease) 03/19/2012  . Sleep apnea   . Essential hypertension     Orientation RESPIRATION BLADDER Height & Weight     Self, Time, Situation, Place  Normal Continent Weight: 165 lb 6.4 oz (75 kg) Height:  5\' 5"  (165.1 cm)  BEHAVIORAL SYMPTOMS/MOOD NEUROLOGICAL BOWEL NUTRITION STATUS      Continent Diet:  Heart Healthy, Thin Liquid  AMBULATORY STATUS COMMUNICATION OF NEEDS Skin   Limited Assistance Verbally Normal                       Personal Care Assistance Level of Assistance  Bathing, Feeding,  Dressing Bathing Assistance: Limited assistance Feeding assistance: Independent Dressing Assistance: Limited assistance     Functional Limitations Info  Sight, Hearing, Speech Sight Info: Adequate Hearing Info: Adequate Speech Info: Adequate    SPECIAL CARE FACTORS FREQUENCY  PT (By licensed PT)     PT Frequency: HHPT              Contractures Contractures Info: Not present    Additional Factors Info  Psychotropic, Code Status, Allergies Code Status Info: Full Code Allergies Info: Aspirin, Chicken Allergy Psychotropic Info: Medications:  Prozac, Klonopin, Xanax         Discharge Medications: Please see discharge summary for a list of discharge medications. Current Discharge Medication List       CONTINUE these medications which have NOT CHANGED   Details  acetaminophen (TYLENOL) 500 MG tablet Take 2 tablets (1,000 mg total) by mouth 4 (four) times daily as needed (for back pain). Qty: 120 tablet, Refills: prn    ALPRAZolam (XANAX) 0.5 MG tablet Take 1 tablet (0.5 mg total) by mouth at bedtime as needed for anxiety. Qty: 30 tablet, Refills: 0    donepezil (ARICEPT) 10 MG tablet take 1 tablet by mouth once daily Qty: 30 tablet, Refills: 5    FLUoxetine (PROZAC) 20 MG tablet Take 1 tablet (20 mg total) by mouth daily. Qty: 30 tablet, Refills: 5    hydrOXYzine (ATARAX/VISTARIL) 10 MG tablet take 1 tablet by mouth at bedtime Qty: 90 tablet, Refills: 0    triamterene-hydrochlorothiazide (MAXZIDE-25) 37.5-25 MG  tablet take 1/2 tablet by mouth once daily Qty: 45 tablet, Refills: 1    clonazePAM (KLONOPIN) 0.5 MG tablet Take 0.5 tablets (0.25 mg total) by mouth at bedtime. Qty: 45 tablet, Refills: 1     Relevant Imaging Results:  Relevant Lab Results:   Additional Information SSN:  [id number]   [place] through [place], [place]

## 2017-01-07 NOTE — Discharge Summary (Signed)
[place] [place] NAME: John Summers    MR#:  [id number]  DATE OF BIRTH:  09-28-1932  DATE OF ADMISSION:  01/06/2017 ADMITTING PHYSICIAN: Harvie Bridge, DO  DATE OF DISCHARGE: 01/07/17  PRIMARY CARE PHYSICIAN: Owens Loffler, MD    ADMISSION DIAGNOSIS:  Near syncope [R55] Abdominal pain [R10.9] Acute renal failure superimposed on chronic kidney disease, on chronic dialysis, unspecified acute renal failure type ([place]) [N17.9, N18.9, Z99.2]  DISCHARGE DIAGNOSIS:  Near syncope suspected due to dehydration/ARf-improved Acute on chronic diarrhea Hypotension-orthostasis, better  SECONDARY DIAGNOSIS:   Past Medical History:  Diagnosis Date  . Colon adenocarcinoma ([place])   . Complication of anesthesia    severe confusion  . GERD (gastroesophageal reflux disease)   . History of colon polyps   . History of gallstones   . Hx of transfusion of platelets   . Hypertension   . ITP (idiopathic thrombocytopenic purpura)    Dr. Grayland Ormond follows  . Kidney stones   . Lewy body dementia with behavioral disturbance   . Major depressive disorder, recurrent episode, in partial remission with anxious distress ([place])   . Pancreatitis 2/11  . Pollen allergies   . Skin cancer    ears  . Sleep apnea    "won't wear mask anymore" (12/22/2012)  . Unspecified essential hypertension     HOSPITAL COURSE:   83 y.o. male with a history of Lewy body dementia, GERD, hypertension, ITP, major depression, pancreatitis, history of colon cancer  now being admitted with:  #. Near syncope, possibly orthostatic, dehydration. Possibly secondary to recent diarrhea -recieved IV fluid hydration - Check orthostatics--shows improvement per Rn -echo done results pending - Trend trops x3 negative -no cp -tele HR 52-56  #. Diarrhea, abdominal pain. Known pancreatic mass which is decreased in size -no diarrhea since admission  #. Acute kidney injury   - creat 1.68---received IV fluids --1.2 - Avoid nephrotoxic medications:   #. History of thrombocytopenia/ITP/anemia, chronic and stable - Continue to monitor CBC -Lovenox ordered for DVT prophylaxis but consider discontinuing if platelets drop further  #. History of dementia - Continue Aricept  PT saw pt---will resumt hHPT  D/w son in the room D/c back to ALF. Pt ate good Bf  CONSULTS OBTAINED:    DRUG ALLERGIES:   Allergies  Allergen Reactions  . Aspirin   . Chicken Allergy Nausea And Vomiting    unknown    DISCHARGE MEDICATIONS:   Current Discharge Medication List    CONTINUE these medications which have NOT CHANGED   Details  acetaminophen (TYLENOL) 500 MG tablet Take 2 tablets (1,000 mg total) by mouth 4 (four) times daily as needed (for back pain). Qty: 120 tablet, Refills: prn    ALPRAZolam (XANAX) 0.5 MG tablet Take 1 tablet (0.5 mg total) by mouth at bedtime as needed for anxiety. Qty: 30 tablet, Refills: 0    donepezil (ARICEPT) 10 MG tablet take 1 tablet by mouth once daily Qty: 30 tablet, Refills: 5    FLUoxetine (PROZAC) 20 MG tablet Take 1 tablet (20 mg total) by mouth daily. Qty: 30 tablet, Refills: 5    hydrOXYzine (ATARAX/VISTARIL) 10 MG tablet take 1 tablet by mouth at bedtime Qty: 90 tablet, Refills: 0    triamterene-hydrochlorothiazide (MAXZIDE-25) 37.5-25 MG tablet take 1/2 tablet by mouth once daily Qty: 45 tablet, Refills: 1    clonazePAM (KLONOPIN) 0.5 MG tablet Take 0.5 tablets (0.25 mg total) by mouth at bedtime. Qty: 45 tablet,  Refills: 1        If you experience worsening of your admission symptoms, develop shortness of breath, life threatening emergency, suicidal or homicidal thoughts you must seek medical attention immediately by calling 911 or calling your MD immediately  if symptoms less severe.  You Must read complete instructions/literature along with all the possible adverse reactions/side effects for all the Medicines  you take and that have been prescribed to you. Take any new Medicines after you have completely understood and accept all the possible adverse reactions/side effects.   Please note  You were cared for by a hospitalist during your hospital stay. If you have any questions about your discharge medications or the care you received while you were in the hospital after you are discharged, you can call the unit and asked to speak with the hospitalist on call if the hospitalist that took care of you is not available. Once you are discharged, your primary care physician will handle any further medical issues. Please note that NO REFILLS for any discharge medications will be authorized once you are discharged, as it is imperative that you return to your primary care physician (or establish a relationship with a primary care physician if you do not have one) for your aftercare needs so that they can reassess your need for medications and monitor your lab values. Today   SUBJECTIVE   Feels ok  VITAL SIGNS:  Blood pressure 137/66, pulse (!) 58, temperature 98 F (36.7 C), temperature source Axillary, resp. rate 14, height 5\' 5"  (1.651 m), weight 75 kg (165 lb 6.4 oz), SpO2 99 %.  I/O:   Intake/Output Summary (Last 24 hours) at 01/07/17 1329 Last data filed at 01/07/17 0956  Gross per 24 hour  Intake          1303.75 ml  Output              100 ml  Net          1203.75 ml    PHYSICAL EXAMINATION:  GENERAL:  83 y.o.-year-old patient lying in the bed with no acute distress.  EYES: Pupils equal, round, reactive to light and accommodation. No scleral icterus. Extraocular muscles intact.  HEENT: Head atraumatic, normocephalic. Oropharynx and nasopharynx clear.  NECK:  Supple, no jugular venous distention. No thyroid enlargement, no tenderness.  LUNGS: Normal breath sounds bilaterally, no wheezing, rales,rhonchi or crepitation. No use of accessory muscles of respiration.  CARDIOVASCULAR: S1, S2 normal. No  murmurs, rubs, or gallops.  ABDOMEN: Soft, non-tender, non-distended. Bowel sounds present. No organomegaly or mass.  EXTREMITIES: No pedal edema, cyanosis, or clubbing.  NEUROLOGIC: Cranial nerves II through XII are intact. Muscle strength 5/5 in all extremities. Sensation intact. Gait not checked.  PSYCHIATRIC: The patient is alert and oriented x 3.  SKIN: No obvious rash, lesion, or ulcer.   DATA REVIEW:   CBC   Recent Labs Lab 01/07/17 0058  WBC 3.4*  HGB 12.2*  HCT 34.6*  PLT 101*    Chemistries   Recent Labs Lab 01/06/17 1852 01/07/17 0058  NA 138 140  K 3.4* 3.2*  CL 103 103  CO2 27 28  GLUCOSE 115* 101*  BUN 32* 31*  CREATININE 1.68* 1.23  CALCIUM 9.2 8.4*  MG  --  1.9  AST 28  --   ALT 13*  --   ALKPHOS 47  --   BILITOT 0.9  --     Microbiology Results   Recent Results (from the past 240  hour(s))  MRSA PCR Screening     Status: None   Collection Time: 01/07/17  5:00 AM  Result Value Ref Range Status   MRSA by PCR NEGATIVE NEGATIVE Final    Comment:        The GeneXpert MRSA Assay (FDA approved for NASAL specimens only), is one component of a comprehensive MRSA colonization surveillance program. It is not intended to diagnose MRSA infection nor to guide or monitor treatment for MRSA infections.     RADIOLOGY:  X-ray Chest Pa And Lateral  Result Date: 01/07/2017 CLINICAL DATA:  83 year old male with a history of syncope. EXAM: CHEST  2 VIEW COMPARISON:  12/08/2012, 11/14/2016 FINDINGS: Cardiomediastinal silhouette unchanged. No evidence of central vascular congestion. Calcifications of the aortic arch. Low lung volumes persist with interstitial opacities with nonspecific pattern. No pleural effusion or pneumothorax. No confluent airspace disease. IMPRESSION: Low lung volumes with likely chronic changes and no evidence of acute cardiopulmonary disease. Aortic atherosclerosis. Electronically Signed   By: Corrie Mckusick D.O.   On: 01/07/2017 08:16    Ct Head Wo Contrast  Result Date: 01/06/2017 CLINICAL DATA:  Syncopal episode EXAM: CT HEAD WITHOUT CONTRAST TECHNIQUE: Contiguous axial images were obtained from the base of the skull through the vertex without intravenous contrast. COMPARISON:  MRI 01/26/2014 FINDINGS: Brain: Mild-to-moderate superficial atrophy with chronic appearing small vessel ischemic disease of periventricular white matter. Idiopathic basal ganglial calcifications are noted bilaterally. No intracranial mass, hemorrhage or midline shift. No extra-axial fluid collections. Vascular: No hyperdense vessels. Mild carotid siphon calcifications bilaterally. Skull: Normal. Negative for fracture or focal lesion. Sinuses/Orbits: Normal bilateral orbits and globes. Minimal ethmoid sinus mucosal thickening. Mastoids are clear. Other: None IMPRESSION: Superficial atrophy. Chronic appearing small vessel ischemic disease of periventricular white matter. No acute intracranial abnormality. Electronically Signed   By: Ashley Royalty M.D.   On: 01/06/2017 21:45   Ct Abdomen Pelvis W Contrast  Result Date: 01/06/2017 CLINICAL DATA:  Syncopal episode.  Diarrhea for the last 2-3 days. EXAM: CT ABDOMEN AND PELVIS WITH CONTRAST TECHNIQUE: Multidetector CT imaging of the abdomen and pelvis was performed using the standard protocol following bolus administration of intravenous contrast. CONTRAST:  53mL ISOVUE-300 IOPAMIDOL (ISOVUE-300) INJECTION 61% COMPARISON:  04/24/2016 CT FINDINGS: Lower chest: Borderline cardiomegaly with coronary arteriosclerosis. No pericardial effusion. Dependent atelectasis at each lung base. Hepatobiliary: No focal liver abnormality is seen. Status post cholecystectomy. No biliary dilatation. Pancreas: Smaller pancreatic body cystic mass measuring 1.2 cm versus 2.3 cm previously. No significant ductal dilatation. No additional pancreatic mass or inflammation. Spleen: Normal in size without focal abnormality. Adrenals/Urinary Tract:  Normal bilateral adrenal glands. No obstructive uropathy. Punctate calculus the interpolar left kidney. No solid enhancing renal mass. Urinary bladder is physiologically distended. Streak artifacts from the patient's left hip arthroplasty slightly limit further assessment especially along the posterior dependent wall were there are subtle hyperdensities believed to be due to streak artifacts although the potential for tiny layering calculi is not entirely excluded. Stomach/Bowel: The stomach is distended with contrast and food. Normal small bowel rotation is seen. Stable small to moderate hiatal hernia. Large ventral abdominal wall defect is again seen containing small bowel loops unchanged in appearance. The patient is status post subtotal right hemicolectomy with intact appearing ileocolic anastomosis in the right upper quadrant. A moderate amount of fecal residue is seen within the remainder of the large intestine. Mild hazy perirectal fat is likely due in part due to the streak artifacts from the hip arthroplasty and less  likely due to proctitis. No significant wall thickening is noted. Vascular/Lymphatic: Atherosclerosis of the non aneurysmal abdominal aorta. 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 Patent portal, splenic, hepatic and renal veins. Reproductive: Stable enlarged prostate measuring 5.2 x 4.7 cm. Other:  No pneumoperitoneum, ascites or focal fluid. Musculoskeletal: No aggressive appearing focal osseous lesions. Left hip arthroplasty. Thoracolumbar spondylosis. IMPRESSION: 1. Stable small bowel and fat containing umbilical hernia. 2. Smaller pancreatic body cystic mass now estimated at 1.2 cm versus 2.3 cm previously. 3. Status post subtotal right hemicolectomy. No acute bowel inflammation. 4. Cardiomegaly with coronary arteriosclerosis. Aortic atherosclerosis. 5. Stable prostatomegaly. Electronically Signed   By: Ashley Royalty M.D.   On: 01/06/2017 21:43     Management plans discussed  with the patient, family and they are in agreement.  CODE STATUS:     Code Status Orders        Start     Ordered   01/07/17 0041  Full code  Continuous     01/07/17 0040    Code Status History    Date Active Date Inactive Code Status Order ID Comments User Context   04/24/2016  9:52 PM 04/25/2016  7:35 PM Full Code [id number]  Lance Coon, MD Inpatient   12/22/2012 12:33 PM 12/23/2012  1:59 PM Full Code [id number]  Harl Bowie, MD Inpatient   03/21/2012  2:57 PM 03/28/2012  8:18 PM Full Code [id number]  Suezanne Cheshire, RN Inpatient    Advance Directive Documentation     Most Recent Value  Type of Advance Directive  Healthcare Power of Attorney, Living will  Pre-existing out of facility DNR order (yellow form or pink MOST form)  -  "MOST" Form in Place?  -      TOTAL TIME TAKING CARE OF THIS PATIENT: *40* minutes.    Ramonica Grigg M.D on 01/07/2017 at 1:29 PM  Between 7am to 6pm - Pager - [phone] After 6pm go to [email or web address] - [facility]  Office  [phone]  CC: Primary care physician; Owens Loffler, MD

## 2017-01-07 NOTE — Evaluation (Signed)
Physical Therapy Evaluation Patient Details Name: John Summers MRN: [id number] DOB: 1933-03-04 Today's Date: 01/07/2017   History of Present Illness  Pt is an 83 y.o.malewith a known history of Lewybody dementia, GERD, hypertension, ITP, major depression, pancreatitis, history of colon cancerpresents to the emergency department for evaluation of near syncope. Patient lives in an assisted living where he states that he has had one week of intermittent diarrhea, decreased appetite and by mouth intake for the past 2-3 days. He was having dinner at a restaurant with his family when he stood up to leave the restaurant he had a period of alteration in his mental status. His daughter states that he was awake and standing but was not communicating, just stared ahead and did not respond for several seconds. He came to spontaneously. He did not lose consciousness, he did not collapse and there was no seizure-like activity. Assessment includes: Near syncope, possibly orthostatic, dehydration  possibly secondary to recent diarrhea, abdominal pain, known pancreatic mass which is decreased in size, AKI, history of thrombocytopenia/ITP/anemia, chronic and stable.    Clinical Impression  Pt presents with mild deficits in functional strength, transfers, mobility, gait, balance, and activity tolerance.  Pt Mod I with bed mobility requiring extra time and effort and use of rails.  Pt required CGA and several attempts at sit to stand from EOB.  Pt able to amb 30' with RW and CGA with min instability but no LOB.  Pt will benefit from PT services to address above deficits for decreased caregiver assistance upon discharge.      Follow Up Recommendations Home health PT    Equipment Recommendations  None recommended by PT    Recommendations for Other Services       Precautions / Restrictions Precautions Precautions: Fall Restrictions Weight Bearing Restrictions: No      Mobility  Bed Mobility Overal  bed mobility: Modified Independent             General bed mobility comments: Extra time and effort required with use of bed rail  Transfers Overall transfer level: Needs assistance Equipment used: Rolling walker (2 wheeled) Transfers: Sit to/from Stand Sit to Stand: Min guard         General transfer comment: Pt required several attempts to come to standing but no physical assistance, mod verbal cues given for sequencing  Ambulation/Gait Ambulation/Gait assistance: Min guard Ambulation Distance (Feet): 30 Feet Assistive device: Rolling walker (2 wheeled) Gait Pattern/deviations: Step-through pattern;Decreased step length - right;Decreased step length - left;Trunk flexed   Gait velocity interpretation: Below normal speed for age/gender General Gait Details: Slow cadence with short B step length and min instability but no assistance needed to prevent LOB  Stairs            Wheelchair Mobility    Modified Rankin (Stroke Patients Only)       Balance Overall balance assessment: Needs assistance Sitting-balance support: Feet supported;No upper extremity supported Sitting balance-Leahy Scale: Good     Standing balance support: Bilateral upper extremity supported Standing balance-Leahy Scale: Fair                               Pertinent Vitals/Pain Pain Assessment: No/denies pain    Home Living Family/patient expects to be discharged to:: Assisted living (History assisted by family secondary to pt's congnitive deficits)               Home Equipment: Walker - 4  wheels;Walker - 2 wheels      Prior Function Level of Independence: Needs assistance   Gait / Transfers Assistance Needed: Mod I amb in ALF with rollator  ADL's / Homemaking Assistance Needed: ALF assists with meals, ADLs, meds        Hand Dominance        Extremity/Trunk Assessment   Upper Extremity Assessment Upper Extremity Assessment: Overall WFL for tasks assessed     Lower Extremity Assessment Lower Extremity Assessment: Overall WFL for tasks assessed       Communication   Communication: No difficulties  Cognition Arousal/Alertness: Awake/alert Behavior During Therapy: WFL for tasks assessed/performed Overall Cognitive Status: History of cognitive impairments - at baseline                                        General Comments      Exercises Total Joint Exercises Ankle Circles/Pumps: Strengthening;Both;10 reps Quad Sets: Strengthening;Both;10 reps Heel Slides: AROM;Both;10 reps Hip ABduction/ADduction: Both;10 reps;Strengthening Straight Leg Raises: AROM;Both;10 reps Long Arc Quad: Strengthening;Both;5 reps Knee Flexion: Strengthening;Both;5 reps Marching in Standing: AROM;Both;10 reps   Assessment/Plan    PT Assessment Patient needs continued PT services  PT Problem List Decreased activity tolerance;Decreased balance;Decreased knowledge of use of DME;Decreased mobility       PT Treatment Interventions Gait training;Functional mobility training;Neuromuscular re-education;Balance training;Therapeutic exercise;Therapeutic activities;Patient/family education    PT Goals (Current goals can be found in the Care Plan section)  Acute Rehab PT Goals Patient Stated Goal: To return home PT Goal Formulation: With patient Time For Goal Achievement: 01/20/17 Potential to Achieve Goals: Good    Frequency Min 2X/week   Barriers to discharge        Co-evaluation               End of Session Equipment Utilized During Treatment: Gait belt Activity Tolerance: Patient tolerated treatment well Patient left: in bed;with bed alarm set;with family/visitor present;with call bell/phone within reach   PT Visit Diagnosis: Difficulty in walking, not elsewhere classified (R26.2);Unsteadiness on feet (R26.81)    Time: 8101-7510 PT Time Calculation (min) (ACUTE ONLY): 32 min   Charges:   PT Evaluation $PT Eval Low  Complexity: 1 Procedure PT Treatments $Therapeutic Exercise: 8-22 mins   PT G Codes:   PT G-Codes **NOT FOR INPATIENT CLASS** Functional Assessment Tool Used: AM-PAC 6 Clicks Basic Mobility Functional Limitation: Mobility: Walking and moving around Mobility: Walking and Moving Around Current Status (C5852): At least 40 percent but less than 60 percent impaired, limited or restricted Mobility: Walking and Moving Around Goal Status [id number]): At least 1 percent but less than 20 percent impaired, limited or restricted    D. Royetta Asal PT, DPT 01/07/17, 2:28 PM

## 2017-01-08 ENCOUNTER — Telehealth: Payer: Self-pay | Admitting: *Deleted

## 2017-01-08 ENCOUNTER — Encounter: Payer: Self-pay | Admitting: *Deleted

## 2017-01-08 ENCOUNTER — Telehealth: Payer: Self-pay

## 2017-01-08 NOTE — Telephone Encounter (Signed)
I know this family very well. His wife just died. I have met with [name] (RN) face to face and on the phone many times regarding [name]. She has been their HCPOA and helped arranged his recent placement.   I am completely comfortable with a letter confirming dx of Lewy Body Dementia.  Please on our letterhead: ok to stamp my name. I think this will be all they need.   Mr. John Summers has been a long-term patient at our practice, and he has significant Lewy Body Dementia.

## 2017-01-08 NOTE — Telephone Encounter (Signed)
His wife very recently died and he has significant Lewy Body Dementia. Will need to speak to family member.

## 2017-01-08 NOTE — Telephone Encounter (Signed)
John Summers pts daughter left v/m; do not see on DPR and John Summers said has POA and do not see copy of POA in chart. John Summers request documentation faxed to bank at fax # [phone] that pt has demensha. [name] on [place] is deceased.Please advise.

## 2017-01-08 NOTE — Telephone Encounter (Signed)
Lm on pt's daughter's vm requesting a call back. Daughter not listed as someone we can speak with in demographics note, but her name is listed on signed DPR form

## 2017-01-08 NOTE — Telephone Encounter (Addendum)
Letter written and faxed to [phone] as instructed by Dr. Lorelei Pont.  Left message for [name] that letter has been faxed to the bank as requested.

## 2017-01-08 NOTE — Telephone Encounter (Signed)
Attempted to complete TCM; all telephone numbers provided are non-working.

## 2017-01-18 ENCOUNTER — Ambulatory Visit: Payer: Medicare Other | Admitting: Family Medicine

## 2017-01-27 ENCOUNTER — Telehealth: Payer: Self-pay | Admitting: *Deleted

## 2017-01-27 NOTE — Telephone Encounter (Signed)
Received fax from [facility] with a list of BP readings.  Fax states patient has orthostatic BP.  He is falling a lot and has been dehydrated and confused.  Showed fax to Dr. Silvio Pate and he wrote orders to discontinue Mr. John Summers.  Order faxed to [place] at [phone].  Will place fax with BP readings in Dr. Lillie Summers in box for further recommendations upon his return to clinic.

## 2017-01-29 ENCOUNTER — Telehealth: Payer: Self-pay | Admitting: Family Medicine

## 2017-01-29 NOTE — Telephone Encounter (Signed)
Discussed pt status with father in detail.  Daughter has not seen him since last week. He eats and drinks if he has some one with him.  Per nurse speaking to her earlier today... He has had some diarrhea, more unstable on feet. As usual confusion off and on. He stumbled into wall and has cut on head. No LOC.   She does not feel he is at the point where he needs pallaitive care.  She feels he needs more one on one care. She is considering trying to have him at [facility] or to bring him home. She feels the current facility he is at is not able to care for him as he needs. She is an Health visitor.Marland Kitchen She will take him to ER in [place] if she feels it is needed.   I offered assistance as needed. She will update [place] through mychart.   FYI to [facility] and Dr. Lorelei Pont

## 2017-01-31 ENCOUNTER — Observation Stay
Admission: AD | Admit: 2017-01-31 | Discharge: 2017-02-02 | Disposition: A | Discharge status: Home / Self Care | Payer: Medicare Other | Source: Ambulatory Visit | Attending: Specialist | Admitting: Specialist

## 2017-01-31 DIAGNOSIS — D693 Immune thrombocytopenic purpura: Secondary | ICD-10-CM | POA: Insufficient documentation

## 2017-01-31 DIAGNOSIS — I10 Essential (primary) hypertension: Secondary | ICD-10-CM | POA: Diagnosis present

## 2017-01-31 DIAGNOSIS — Z79899 Other long term (current) drug therapy: Secondary | ICD-10-CM | POA: Insufficient documentation

## 2017-01-31 DIAGNOSIS — E860 Dehydration: Secondary | ICD-10-CM | POA: Insufficient documentation

## 2017-01-31 DIAGNOSIS — N179 Acute kidney failure, unspecified: Secondary | ICD-10-CM | POA: Diagnosis not present

## 2017-01-31 DIAGNOSIS — M542 Cervicalgia: Secondary | ICD-10-CM

## 2017-01-31 DIAGNOSIS — Z85828 Personal history of other malignant neoplasm of skin: Secondary | ICD-10-CM | POA: Diagnosis not present

## 2017-01-31 DIAGNOSIS — Z85038 Personal history of other malignant neoplasm of large intestine: Secondary | ICD-10-CM | POA: Insufficient documentation

## 2017-01-31 DIAGNOSIS — F329 Major depressive disorder, single episode, unspecified: Secondary | ICD-10-CM | POA: Insufficient documentation

## 2017-01-31 DIAGNOSIS — G3183 Dementia with Lewy bodies: Secondary | ICD-10-CM | POA: Diagnosis not present

## 2017-01-31 DIAGNOSIS — K219 Gastro-esophageal reflux disease without esophagitis: Secondary | ICD-10-CM | POA: Diagnosis not present

## 2017-01-31 DIAGNOSIS — I480 Paroxysmal atrial fibrillation: Secondary | ICD-10-CM | POA: Diagnosis not present

## 2017-01-31 DIAGNOSIS — G4733 Obstructive sleep apnea (adult) (pediatric): Secondary | ICD-10-CM | POA: Insufficient documentation

## 2017-01-31 DIAGNOSIS — R52 Pain, unspecified: Secondary | ICD-10-CM

## 2017-01-31 DIAGNOSIS — N390 Urinary tract infection, site not specified: Principal | ICD-10-CM | POA: Diagnosis present

## 2017-01-31 DIAGNOSIS — R531 Weakness: Secondary | ICD-10-CM | POA: Diagnosis not present

## 2017-01-31 DIAGNOSIS — M549 Dorsalgia, unspecified: Secondary | ICD-10-CM

## 2017-01-31 NOTE — H&P (Signed)
[facility] at [place] NAME: John Summers    MR#:  [id number]  DATE OF BIRTH:  06/02/1933  DATE OF ADMISSION:  01/31/2017  PRIMARY CARE PHYSICIAN: Owens Loffler, MD    CHIEF COMPLAINT:  Weakness  HISTORY OF PRESENT ILLNESS:  John Summers  is a 83 y.o. male who presents with Weakness and confusion. Patient was initially seen at person [facility] in [place]. He was transferred here to our hospital per request by patient and family. He was nitrite positive on his UA at [place].  PAST MEDICAL HISTORY:   Past Medical History:  Diagnosis Date  . Colon adenocarcinoma ([place])   . Complication of anesthesia    severe confusion  . GERD (gastroesophageal reflux disease)   . History of colon polyps   . History of gallstones   . Hx of transfusion of platelets   . Hypertension   . ITP (idiopathic thrombocytopenic purpura)    Dr. Grayland Ormond follows  . Kidney stones   . Lewy body dementia with behavioral disturbance   . Major depressive disorder, recurrent episode, in partial remission with anxious distress ([place])   . Pancreatitis 2/11  . Pollen allergies   . Skin cancer    ears  . Sleep apnea    "won't wear mask anymore" (12/22/2012)  . Unspecified essential hypertension     PAST SURGICAL HISTORY:   Past Surgical History:  Procedure Laterality Date  . CARDIAC CATHETERIZATION  2000's   "tx'd w/acid reflux RX" (12/22/2012)  . CHOLECYSTECTOMY  2011  . ESOPHAGEAL DILATION  1990's?  Marland Kitchen HERNIA REPAIR  12/22/2012   IHR w/mesh  . INGUINAL HERNIA REPAIR Left [id number]  . INSERTION OF MESH N/A 12/22/2012   Procedure: INSERTION OF MESH;  Surgeon: Harl Bowie, MD;  Location: [place];  Service: General;  Laterality: N/A;  . LITHOTRIPSY  2008   x2  . PARTIAL COLECTOMY  03/21/2012   Procedure: PARTIAL COLECTOMY;  Surgeon: Harl Bowie, MD;  Location: [place];  Service: General;  Laterality: Right;  . PILONIDAL  CYST EXCISION  1960's?  Marland Kitchen SKIN CANCER EXCISION     "face; ?left ear" (12/22/2012)  . TONSILLECTOMY AND ADENOIDECTOMY  1946  . TOTAL HIP ARTHROPLASTY Left ~ 2002  . UPPER GASTROINTESTINAL ENDOSCOPY    . VENTRAL HERNIA REPAIR N/A 12/22/2012   Procedure: HERNIA REPAIR INCISIONAL ;  Surgeon: Harl Bowie, MD;  Location: [place];  Service: General;  Laterality: N/A;    SOCIAL HISTORY:   Social History  Substance Use Topics  . Smoking status: Former Smoker    Types: Pipe  . Smokeless tobacco: Never Used     Comment: i4/06/2013 "only smoked pipe n college 1958"  . Alcohol use No    FAMILY HISTORY:   Family History  Problem Relation Age of Onset  . Colon cancer Mother   . Heart disease Mother   . Hypertension Mother   . Alcohol abuse Father   . Esophageal cancer Neg Hx   . Rectal cancer Neg Hx   . Stomach cancer Neg Hx     DRUG ALLERGIES:   Allergies  Allergen Reactions  . Aspirin   . Chicken Allergy Nausea And Vomiting    unknown    MEDICATIONS AT HOME:   Prior to Admission medications   Medication Sig Start Date End Date Taking? Authorizing Provider  acetaminophen (TYLENOL) 500 MG tablet Take 2 tablets (1,000 mg total) by mouth 4 (  four) times daily as needed (for back pain). 12/21/16  Yes Copland, Frederico Hamman, MD  ALPRAZolam Duanne Moron) 0.5 MG tablet Take 1 tablet (0.5 mg total) by mouth at bedtime as needed for anxiety. 10/16/16  Yes Dohmeier, Asencion Partridge, MD  donepezil (ARICEPT) 10 MG tablet take 1 tablet by mouth once daily 11/09/16  Yes Dohmeier, Asencion Partridge, MD  FLUoxetine (PROZAC) 20 MG tablet Take 1 tablet (20 mg total) by mouth daily. 11/09/16  Yes Copland, Frederico Hamman, MD  hydrOXYzine (ATARAX/VISTARIL) 10 MG tablet take 1 tablet by mouth at bedtime 12/07/16  Yes Dohmeier, Asencion Partridge, MD  clonazePAM (KLONOPIN) 0.5 MG tablet Take 0.5 tablets (0.25 mg total) by mouth at bedtime. Patient not taking: Reported on 01/06/2017 12/04/16   Dohmeier, Asencion Partridge, MD  triamterene-hydrochlorothiazide  [facility]) 37.5-25 MG tablet take 1/2 tablet by mouth once daily Patient not taking: Reported on 01/31/2017 10/29/16   Owens Loffler, MD    REVIEW OF SYSTEMS:  Review of Systems  Constitutional: Negative for chills, fever, malaise/fatigue and weight loss.  HENT: Negative for ear pain, hearing loss and tinnitus.   Eyes: Negative for blurred vision, double vision, pain and redness.  Respiratory: Negative for cough, hemoptysis and shortness of breath.   Cardiovascular: Negative for chest pain, palpitations, orthopnea and leg swelling.  Gastrointestinal: Negative for abdominal pain, constipation, diarrhea, nausea and vomiting.  Genitourinary: Positive for dysuria. Negative for frequency and hematuria.  Musculoskeletal: Negative for back pain, joint pain and neck pain.  Skin:       No acne, rash, or lesions  Neurological: Positive for weakness. Negative for dizziness, tremors and focal weakness.       Confusion  Endo/Heme/Allergies: Negative for polydipsia. Does not bruise/bleed easily.  Psychiatric/Behavioral: Negative for depression. The patient is not nervous/anxious and does not have insomnia.      VITAL SIGNS:   Vitals:   01/31/17 2321  BP: (!) 92/48  Pulse: 75  Resp: (!) 21  Temp: 98 F (36.7 C)  TempSrc: Oral  SpO2: 98%  Weight: 90.5 kg (199 lb 8 oz)  Height: 5\' 9"  (1.753 m)   Wt Readings from Last 3 Encounters:  01/31/17 90.5 kg (199 lb 8 oz)  01/07/17 75 kg (165 lb 6.4 oz)  11/14/16 95.3 kg (210 lb)    PHYSICAL EXAMINATION:  Physical Exam  Vitals reviewed. Constitutional: He appears well-developed and well-nourished. No distress.  HENT:  Head: Normocephalic and atraumatic.  Mouth/Throat: Oropharynx is clear and moist.  Eyes: Conjunctivae and EOM are normal. Pupils are equal, round, and reactive to light. No scleral icterus.  Neck: Normal range of motion. Neck supple. No JVD present. No thyromegaly present.  Cardiovascular: Normal rate, regular rhythm and  intact distal pulses.  Exam reveals no gallop and no friction rub.   No murmur heard. Respiratory: Effort normal and breath sounds normal. No respiratory distress. He has no wheezes. He has no rales.  GI: Soft. Bowel sounds are normal. He exhibits no distension. There is no tenderness.  Musculoskeletal: Normal range of motion. He exhibits no edema.  No arthritis, no gout  Lymphadenopathy:    He has no cervical adenopathy.  Neurological: He is alert. No cranial nerve deficit.  No dysarthria, no aphasia, patient has some intermittent confusion to certain details related to his circumstance, but is alert and oriented to person and place  Skin: Skin is warm and dry. No rash noted. No erythema.  Psychiatric: He has a normal mood and affect. His behavior is normal. Judgment and thought content normal.  LABORATORY PANEL:   CBC No results for input(s): WBC, HGB, HCT, PLT in the last 168 hours. ------------------------------------------------------------------------------------------------------------------  Chemistries  No results for input(s): NA, K, CL, CO2, GLUCOSE, BUN, CREATININE, CALCIUM, MG, AST, ALT, ALKPHOS, BILITOT in the last 168 hours.  Invalid input(s): GFRCGP ------------------------------------------------------------------------------------------------------------------  Cardiac Enzymes No results for input(s): TROPONINI in the last 168 hours. ------------------------------------------------------------------------------------------------------------------  RADIOLOGY:  No results found.  EKG:   Orders placed or performed during the hospital encounter of 01/06/17  . EKG 12-Lead  . EKG 12-Lead  . EKG 12-Lead  . EKG 12-Lead  . EKG    IMPRESSION AND PLAN:  Principal Problem:   UTI (urinary tract infection) - IV antibiotics, follow-up on urine culture Active Problems:   AKI (acute kidney injury) ([place]) - gentle IV fluids tonight, monitor for expected improvement    Essential hypertension - continue home meds   AF (paroxysmal atrial fibrillation) ([place]) - continue home rate controlling medications  All the records are reviewed and case discussed with ED provider. Management plans discussed with the patient and/or family.  DVT PROPHYLAXIS: SubQ heparin  GI PROPHYLAXIS: None  ADMISSION STATUS: Inpatient  CODE STATUS: Full Code Status History    Date Active Date Inactive Code Status Order ID Comments User Context   01/07/2017 12:40 AM 01/07/2017  8:17 PM Full Code [id number]  MinorUbaldo Glassing, DO Inpatient   04/24/2016  9:52 PM 04/25/2016  7:35 PM Full Code [id number]  Lance Coon, MD Inpatient   12/22/2012 12:33 PM 12/23/2012  1:59 PM Full Code [id number]  Harl Bowie, MD Inpatient   03/21/2012  2:57 PM 03/28/2012  8:18 PM Full Code [id number]  Suezanne Cheshire, RN Inpatient    Advance Directive Documentation     Most Recent Value  Type of Advance Directive  Living will, Healthcare Power of Attorney  Pre-existing out of facility DNR order (yellow form or pink MOST form)  -  "MOST" Form in Place?  -      TOTAL TIME TAKING CARE OF THIS PATIENT: 45 minutes.   Lateasha Breuer [place] 01/31/2017, 11:32 PM  [facility]  Office  [phone]  CC: Primary care physician; Owens Loffler, MD  Note:  This document was prepared using Dragon voice recognition software and may include unintentional dictation errors.

## 2017-02-01 ENCOUNTER — Inpatient Hospital Stay: Payer: Medicare Other

## 2017-02-01 ENCOUNTER — Observation Stay: Payer: Medicare Other

## 2017-02-01 DIAGNOSIS — N179 Acute kidney failure, unspecified: Secondary | ICD-10-CM | POA: Diagnosis present

## 2017-02-01 DIAGNOSIS — I480 Paroxysmal atrial fibrillation: Secondary | ICD-10-CM | POA: Diagnosis present

## 2017-02-01 DIAGNOSIS — N390 Urinary tract infection, site not specified: Secondary | ICD-10-CM | POA: Diagnosis not present

## 2017-02-01 LAB — TROPONIN I
TROPONIN I: 0.05 ng/mL — AB (ref <0.03)
TROPONIN I: 0.06 ng/mL — AB (ref <0.03)
TROPONIN I: 0.07 ng/mL — AB (ref <0.03)

## 2017-02-01 LAB — BASIC METABOLIC PANEL
ANION GAP: 4 — AB (ref 5–15)
BUN: 25 mg/dL — ABNORMAL HIGH (ref 6–20)
CALCIUM: 7.8 mg/dL — AB (ref 8.9–10.3)
CHLORIDE: 113 mmol/L — AB (ref 101–111)
CO2: 25 mmol/L (ref 22–32)
CREATININE: 1.3 mg/dL — AB (ref 0.61–1.24)
GFR calc non Af Amer: 49 mL/min — ABNORMAL LOW (ref >60)
GFR, EST AFRICAN AMERICAN: 57 mL/min — AB (ref >60)
Glucose, Bld: 95 mg/dL (ref 65–99)
Potassium: 3.4 mmol/L — ABNORMAL LOW (ref 3.5–5.1)
SODIUM: 142 mmol/L (ref 135–145)

## 2017-02-01 LAB — URINALYSIS, COMPLETE (UACMP) WITH MICROSCOPIC
BACTERIA UA: NONE SEEN
Bilirubin Urine: NEGATIVE
Glucose, UA: NEGATIVE mg/dL
Ketones, ur: NEGATIVE mg/dL
NITRITE: NEGATIVE
Protein, ur: 100 mg/dL — AB
SQUAMOUS EPITHELIAL / LPF: NONE SEEN
Specific Gravity, Urine: 1.03 (ref 1.005–1.030)
pH: 5 (ref 5.0–8.0)

## 2017-02-01 LAB — CBC
HCT: 27.7 % — ABNORMAL LOW (ref 40.0–52.0)
HEMOGLOBIN: 9.9 g/dL — AB (ref 13.0–18.0)
MCH: 37.2 pg — ABNORMAL HIGH (ref 26.0–34.0)
MCHC: 35.6 g/dL (ref 32.0–36.0)
MCV: 104.5 fL — ABNORMAL HIGH (ref 80.0–100.0)
Platelets: 62 10*3/uL — ABNORMAL LOW (ref 150–440)
RBC: 2.65 MIL/uL — ABNORMAL LOW (ref 4.40–5.90)
RDW: 13.2 % (ref 11.5–14.5)
WBC: 6.8 10*3/uL (ref 3.8–10.6)

## 2017-02-01 MED ORDER — HALOPERIDOL LACTATE 5 MG/ML IJ SOLN: 2.0000 mg | Freq: Once | INTRAMUSCULAR | Status: DC

## 2017-02-01 MED ORDER — ACETAMINOPHEN 325 MG PO TABS
650.0000 mg | ORAL_TABLET | Freq: Four times a day (QID) | ORAL | Status: DC | PRN
  Administered 2017-02-01: 10:00:00 650 mg via ORAL
  Filled 2017-02-01: qty 2

## 2017-02-01 MED ORDER — DEXTROSE 5 % IV SOLN: 2.0000 g | INTRAVENOUS | Status: DC

## 2017-02-01 MED ORDER — ACETAMINOPHEN 650 MG RE SUPP: 650.0000 mg | Freq: Four times a day (QID) | RECTAL | Status: DC | PRN

## 2017-02-01 MED ORDER — HEPARIN SODIUM (PORCINE) 5000 UNIT/ML IJ SOLN
5000.0000 [IU] | Freq: Three times a day (TID) | INTRAMUSCULAR | Status: DC
  Administered 2017-02-01 – 2017-02-02 (×4): 5000 [IU] via SUBCUTANEOUS
  Filled 2017-02-01 (×4): qty 1

## 2017-02-01 MED ORDER — SODIUM CHLORIDE 0.9 % IV SOLN
INTRAVENOUS | Status: AC
  Administered 2017-02-01: 01:00:00 via INTRAVENOUS

## 2017-02-01 MED ORDER — DONEPEZIL HCL 5 MG PO TABS
10.0000 mg | ORAL_TABLET | Freq: Every day | ORAL | Status: DC
  Administered 2017-02-01 – 2017-02-02 (×2): 10 mg via ORAL
  Filled 2017-02-01 (×2): qty 2

## 2017-02-01 MED ORDER — FLUOXETINE HCL 20 MG PO CAPS
20.0000 mg | ORAL_CAPSULE | Freq: Every day | ORAL | Status: DC
  Administered 2017-02-01 – 2017-02-02 (×2): 20 mg via ORAL
  Filled 2017-02-01 (×2): qty 1

## 2017-02-01 MED ORDER — DEXTROSE 5 % IV SOLN
2.0000 g | Freq: Once | INTRAVENOUS | Status: DC
  Filled 2017-02-01: qty 2

## 2017-02-01 MED ORDER — ONDANSETRON HCL 4 MG PO TABS: 4.0000 mg | ORAL_TABLET | Freq: Four times a day (QID) | ORAL | Status: DC | PRN

## 2017-02-01 MED ORDER — DEXTROSE 5 % IV SOLN
2.0000 g | INTRAVENOUS | Status: DC
  Administered 2017-02-01 – 2017-02-02 (×2): 2 g via INTRAVENOUS
  Filled 2017-02-01 (×2): qty 2

## 2017-02-01 MED ORDER — HYDROCODONE-ACETAMINOPHEN 5-325 MG PO TABS
1.0000 | ORAL_TABLET | ORAL | Status: DC | PRN
  Administered 2017-02-01 – 2017-02-02 (×3): 1 via ORAL
  Filled 2017-02-01 (×3): qty 1

## 2017-02-01 MED ORDER — ONDANSETRON HCL 4 MG/2ML IJ SOLN: 4.0000 mg | Freq: Four times a day (QID) | INTRAMUSCULAR | Status: DC | PRN

## 2017-02-01 MED ORDER — HYDROXYZINE HCL 10 MG PO TABS
10.0000 mg | ORAL_TABLET | Freq: Every day | ORAL | Status: DC
  Administered 2017-02-01 (×2): 10 mg via ORAL
  Filled 2017-02-01 (×2): qty 1

## 2017-02-01 MED ORDER — LORAZEPAM 2 MG/ML IJ SOLN: 0.5000 mg | Freq: Once | INTRAMUSCULAR | Status: DC

## 2017-02-01 NOTE — Progress Notes (Signed)
[facility] at [place] NAME: John Summers    MR#:  [id number]  DATE OF BIRTH:  [birth year]  SUBJECTIVE:   Patient here due to weakness, fall and noted to have a urinary tract infection. Patient's daughter is at bedside. Patient still feels increasingly weak, also complaining of some neck/mid back pain.    REVIEW OF SYSTEMS:    Review of Systems  Constitutional: Negative for chills and fever.  HENT: Negative for congestion and tinnitus.   Eyes: Negative for blurred vision and double vision.  Respiratory: Negative for cough, shortness of breath and wheezing.   Cardiovascular: Negative for chest pain, orthopnea and PND.  Gastrointestinal: Negative for abdominal pain, diarrhea, nausea and vomiting.  Genitourinary: Negative for dysuria and hematuria.  Musculoskeletal: Positive for back pain and neck pain.  Neurological: Positive for weakness. Negative for dizziness, sensory change and focal weakness.  All other systems reviewed and are negative.   Nutrition: heart Healthy Tolerating Diet: Yes Tolerating PT: Await Eval.   DRUG ALLERGIES:   Allergies  Allergen Reactions  . Aspirin   . Chicken Allergy Nausea And Vomiting    Unknown   . Other Nausea And Vomiting    [place] per daughter     VITALS:  Blood pressure (!) 92/53, pulse 76, temperature 98.7 F (37.1 C), temperature source Oral, resp. rate 18, height 5\' 9"  (1.753 m), weight 90.5 kg (199 lb 8 oz), SpO2 100 %.  PHYSICAL EXAMINATION:   Physical Exam  GENERAL:  83 y.o.-year-old patient lying in bed in no acute distress.  EYES: Pupils equal, round, reactive to light and accommodation. No scleral icterus. Extraocular muscles intact.  HEENT: Head atraumatic, normocephalic. Oropharynx and nasopharynx clear.  NECK:  Supple, no jugular venous distention. No thyroid enlargement, no tenderness.  LUNGS: Normal breath sounds bilaterally, no wheezing, rales, rhonchi. No use of accessory  muscles of respiration.  CARDIOVASCULAR: S1, S2 normal. No murmurs, rubs, or gallops.  ABDOMEN: Soft, nontender, nondistended. Bowel sounds present. No organomegaly or mass.  EXTREMITIES: No cyanosis, clubbing or edema b/l.    NEUROLOGIC: Cranial nerves II through XII are intact. No focal Motor or sensory deficits b/l.  Globally weak.  PSYCHIATRIC: The patient is alert and oriented x 3.   SKIN: No obvious rash, lesion, or ulcer.    LABORATORY PANEL:   CBC  Recent Labs Lab 02/01/17 0641  WBC 6.8  HGB 9.9*  HCT 27.7*  PLT 62*   ------------------------------------------------------------------------------------------------------------------  Chemistries   Recent Labs Lab 02/01/17 0641  NA 142  K 3.4*  CL 113*  CO2 25  GLUCOSE 95  BUN 25*  CREATININE 1.30*  CALCIUM 7.8*   ------------------------------------------------------------------------------------------------------------------  Cardiac Enzymes  Recent Labs Lab 02/01/17 0641  TROPONINI 0.06*   ------------------------------------------------------------------------------------------------------------------  RADIOLOGY:  Dg Cervical Spine Complete  Result Date: 02/01/2017 CLINICAL DATA:  Neck pain EXAM: CERVICAL SPINE - COMPLETE 4+ VIEW COMPARISON:  None. FINDINGS: Diffuse degenerative facet disease bilaterally. Degenerative disc disease at C5-6 and C6-7. 2 mm anterolisthesis of C4 on C5. No fracture. Prevertebral soft tissues are normal. IMPRESSION: Degenerative disc and facet disease. Slight anterolisthesis of C4 on C5. No acute findings. Electronically Signed   By: Rolm Baptise M.D.   On: 02/01/2017 11:45   Dg Thoracic Spine 2 View  Result Date: 02/01/2017 CLINICAL DATA:  Neck and upper back pain.  Fall several weeks ago. EXAM: THORACIC SPINE 2 VIEWS COMPARISON:  Chest x-ray 01/07/2017 FINDINGS: Degenerative spurring.  No fracture.  Alignment is normal. IMPRESSION: No acute findings. Electronically Signed    By: Rolm Baptise M.D.   On: 02/01/2017 11:45   Dg Shoulder Right  Result Date: 02/01/2017 CLINICAL DATA:  Right shoulder pain without known injury. EXAM: RIGHT SHOULDER - 2+ VIEW COMPARISON:  None. FINDINGS: There is no evidence of fracture or dislocation. Severe narrowing of right acromioclavicular joint is noted. Soft tissues are unremarkable. IMPRESSION: Severe degenerative joint disease of right acromioclavicular joint. No acute abnormality seen in the right shoulder. Electronically Signed   By: Marijo Conception, M.D.   On: 02/01/2017 08:33     ASSESSMENT AND PLAN:   83 year old male with past medical history of ITP, hypertension, GERD, history of colon cancer, depression, obstructive sleep apnea, dementia who presents to the hospital due to falls, weakness and noted to have urinary tract infection.  1. Urinary tract infection-this is based on a positive urinalysis. Continue IV ceftriaxone, follow urine cultures. -Patient is clinically afebrile and hemodynamically stable.  2. Weakness/fall-etiology unclear. Suspect to be secondary to the UTI. -We will check orthostatic vital signs, weight physical therapy evaluation.  3. Acute kidney injury-secondary to hypotension and dehydration. Improving with IV fluids. Follow BUN and creatinine, renal dose meds, avoid nephrotoxins.  4. History of ITP-platelet count is currently stable. No acute bleeding.  5. Elevated troponin-likely supply demand ischemia secondary to acute kidney injury. No acute chest pain, no arrhythmias on telemetry. -Await echocardiogram results.  6. Dementia-continue Aricept.  7. Depression - cont. Prozac.      All the records are reviewed and case discussed with Care Management/Social Worker. Management plans discussed with the patient, family and they are in agreement.  CODE STATUS: Full code  DVT Prophylaxis: Hep. SQ  TOTAL TIME TAKING CARE OF THIS PATIENT: 30 minutes.   POSSIBLE D/C IN 1-2 DAYS, DEPENDING ON  CLINICAL CONDITION.   Henreitta Leber M.D on 02/01/2017 at 1:34 PM  Between 7am to 6pm - Pager - [phone]  After 6pm go to [email or web address] - Proofreader  [facility]  Office  [phone]  CC: Primary care physician; Owens Loffler, MD

## 2017-02-01 NOTE — Clinical Social Work Placement (Signed)
   CLINICAL SOCIAL WORK PLACEMENT  NOTE  Date:  02/01/2017  Patient Details  Name: John Summers MRN: [id number] Date of Birth: 1932/09/24  Clinical Social Work is seeking post-discharge placement for this patient at the [place] level of care (*CSW will initial, date and re-position this form in  chart as items are completed):  Yes   Patient/family provided with [place] Work Department's list of facilities offering this level of care within the geographic area requested by the patient (or if unable, by the patient's family).  Yes   Patient/family informed of their freedom to choose among providers that offer the needed level of care, that participate in Medicare, Medicaid or managed care program needed by the patient, have an available bed and are willing to accept the patient.  Yes   Patient/family informed of [facility]'s ownership interest in [facility] and [facility], as well as of the fact that they are under no obligation to receive care at these facilities.  PASRR submitted to EDS on       PASRR number received on       Existing PASRR number confirmed on 02/01/17     FL2 transmitted to all facilities in geographic area requested by pt/family on 02/01/17     FL2 transmitted to all facilities within larger geographic area on       Patient informed that his/her managed care company has contracts with or will negotiate with certain facilities, including the following:            Patient/family informed of bed offers received.  Patient chooses bed at       Physician recommends and patient chooses bed at      Patient to be transferred to   on  .  Patient to be transferred to facility by       Patient family notified on   of transfer.  Name of family member notified:        PHYSICIAN       Additional Comment:    _______________________________________________ Darden Dates, LCSW 02/01/2017, 12:20 PM

## 2017-02-01 NOTE — Progress Notes (Signed)
Pharmacy Antibiotic Note  John Summers is a 83 y.o. male admitted on 01/31/2017 with UTI.  Pharmacy has been consulted for ceftriaxone dosing.  Plan: Ceftriaxone 2 grams q 24 hours ordered.  Height: 5\' 9"  (175.3 cm) Weight: 199 lb 8 oz (90.5 kg) IBW/kg (Calculated) : 70.7  Temp (24hrs), Avg:98 F (36.7 C), Min:98 F (36.7 C), Max:98 F (36.7 C)  No results for input(s): WBC, CREATININE, LATICACIDVEN, VANCOTROUGH, VANCOPEAK, VANCORANDOM, GENTTROUGH, GENTPEAK, GENTRANDOM, TOBRATROUGH, TOBRAPEAK, TOBRARND, AMIKACINPEAK, AMIKACINTROU, AMIKACIN in the last 168 hours.  CrCl cannot be calculated (Patient's most recent lab result is older than the maximum 21 days allowed.).    Allergies  Allergen Reactions  . Aspirin   . Chicken Allergy Nausea And Vomiting    unknown    Antimicrobials this admission: ceftriaxone 5/21 >>    >>  Per RN, patient got a dose of Rocephin at OSH 5/20 prior to admission here. Will start abx 5/21 AM.  Dose adjustments this admission:   Microbiology results: 5/21 UCx: pending       5/21 UA: pending  Thank you for allowing pharmacy to be a part of this patient's care.  Makinzi Prieur S 02/01/2017 1:22 AM

## 2017-02-01 NOTE — Care Management CC44 (Signed)
Condition Code 44 Documentation Completed  Patient Details  Name: John Summers MRN: [id number] Date of Birth: 06/28/1933   Condition Code 44 given:  Yes Patient signature on Condition Code 44 notice:  Yes Documentation of 2 MD's agreement:  Yes Code 44 added to claim:  Yes    Shelbie Ammons, RN 02/01/2017, 12:42 PM

## 2017-02-01 NOTE — NC FL2 (Signed)
[place] LEVEL OF CARE SCREENING TOOL     IDENTIFICATION  Patient Name: KENTAVIUS DETTORE Birthdate: 06/22/1933 Sex: male Admission Date (Current Location): 01/31/2017  [place] and [place] Number:  Engineering geologist and Address:  [facility], [address] [place], [place] [address]      Provider Number: [id number]  Attending Physician Name and Address:  Henreitta Leber, MD  Relative Name and Phone Number:       Current Level of Care: Hospital Recommended Level of Care: [place] Prior Approval Number:    Date Approved/Denied:   PASRR Number: [id number]  Discharge Plan: SNF    Current Diagnoses: Patient Active Problem List   Diagnosis Date Noted  . AF (paroxysmal atrial fibrillation) ([place]) 02/01/2017  . AKI (acute kidney injury) ([place]) 02/01/2017  . UTI (urinary tract infection) 01/31/2017  . Near syncope 01/06/2017  . Gait disturbance 12/30/2016  . MGUS (monoclonal gammopathy of unknown significance) 05/10/2016  . Pancreatic mass 04/24/2016  . Thrombocytopenia ([place]) 10/04/2015  . Uncontrolled REM sleep behavior disorder 11/16/2014  . Lewy body dementia with behavioral disturbance 11/16/2014  . Tremor of right hand 11/16/2014  . Shuffling gait 11/16/2014  . Major depressive disorder, recurrent episode, in partial remission with anxious distress ([place])   . REM sleep behavior disorder 12/07/2013  . Incisional hernia, without obstruction or gangrene 11/14/2012  . Adenocarcinoma of cecum ([place]) 03/19/2012  . GERD (gastroesophageal reflux disease) 03/19/2012  . Sleep apnea   . Essential hypertension     Orientation RESPIRATION BLADDER Height & Weight     Self, Place  Normal Continent Weight: 199 lb 8 oz (90.5 kg) Height:  5\' 9"  (175.3 cm)  BEHAVIORAL SYMPTOMS/MOOD NEUROLOGICAL BOWEL NUTRITION STATUS         (Heart Healthy, Thin Liquids)  AMBULATORY STATUS COMMUNICATION OF NEEDS Skin   Extensive Assist  Verbally Normal                       Personal Care Assistance Level of Assistance  Bathing, Feeding, Dressing Bathing Assistance: Limited assistance Feeding assistance: Independent Dressing Assistance: Limited assistance     Functional Limitations Info  Sight, Hearing, Speech Sight Info: Adequate Hearing Info: Adequate Speech Info: Adequate    SPECIAL CARE FACTORS FREQUENCY  PT (By licensed PT)     PT Frequency: 5              Contractures Contractures Info: Not present    Additional Factors Info  Code Status, Allergies, Psychotropic Code Status Info: Full Code Allergies Info: Aspirin, Chicken Allergy, Other Psychotropic Info: Medication:  Prozac         Current Medications (02/01/2017):  This is the current hospital active medication list Current Facility-Administered Medications  Medication Dose Route Frequency Provider Last Rate Last Dose  . acetaminophen (TYLENOL) tablet 650 mg  650 mg Oral Q6H PRN Lance Coon, MD   650 mg at 02/01/17 5361   Or  . acetaminophen (TYLENOL) suppository 650 mg  650 mg Rectal Q6H PRN Lance Coon, MD      . cefTRIAXone (ROCEPHIN) 2 g in dextrose 5 % 50 mL IVPB  2 g Intravenous Q24H Lance Coon, MD   Stopped at 02/01/17 1005  . donepezil (ARICEPT) tablet 10 mg  10 mg Oral Daily Lance Coon, MD   10 mg at 02/01/17 0934  . FLUoxetine (PROZAC) capsule 20 mg  20 mg Oral Daily Lance Coon, MD   20  mg at 02/01/17 0934  . heparin injection 5,000 Units  5,000 Units Subcutaneous Driscilla Moats, MD   5,000 Units at 02/01/17 0602  . HYDROcodone-acetaminophen (NORCO/VICODIN) 5-325 MG per tablet 1 tablet  1 tablet Oral Q4H PRN Henreitta Leber, MD   1 tablet at 02/01/17 1058  . hydrOXYzine (ATARAX/VISTARIL) tablet 10 mg  10 mg Oral Corwin Levins, MD   10 mg at 02/01/17 0117  . ondansetron (ZOFRAN) tablet 4 mg  4 mg Oral Q6H PRN Lance Coon, MD       Or  . ondansetron [facility]) injection 4 mg  4 mg Intravenous Q6H PRN Lance Coon, MD         Discharge Medications: Please see discharge summary for a list of discharge medications.  Relevant Imaging Results:  Relevant Lab Results:   Additional Information SSN:  [id number]  Darden Dates, LCSW

## 2017-02-01 NOTE — Clinical Social Work Note (Signed)
Clinical Social Work Assessment  Patient Details  Name: John Summers MRN: [id number] Date of Birth: Nov 27, 1932  Date of referral:  02/01/17               Reason for consult:  Facility Placement, Discharge Planning                Permission sought to share information with:  Family Supports Permission granted to share information::  Yes, Verbal Permission Granted  Name::     Edd Arbour  Relationship::  step-daughter  Contact Information:  [phone]   Housing/Transportation Living arrangements for the past 2 months:  [place] of Information:  Adult Children Patient Interpreter Needed:  None Criminal Activity/Legal Involvement Pertinent to Current Situation/Hospitalization:  No - Comment as needed Significant Relationships:  Adult Children Lives with:  Facility Resident Do you feel safe going back to the place where you live?  No (Pt is in need of a higher level of care.) Need for family participation in patient care:  Yes (Comment)  Care giving concerns:  Pt is in need of a higher level of care. PT eval is pending.  Social Worker assessment / plan:  CSW met with pt and step-daughter to address consult for pt as he was admitted from [place]. CSW introduced herself and explained role of social work. CSW also explained the process of discharging to facility. Pt's step-daugher feels that pt would benefit from STR. PT eval is pending. Pt's step-daughter is agreeable to a SNF search. CSW initiated SNF search and will follow up with bed offers. CSW will continue to follow.   Employment status:  Retired Nurse, adult PT Recommendations:  Not assessed at this time (PT eval is pending at time of assessment) Information / Referral to community resources:  [place]  Patient/Family's Response to care:  Pt's step-daughter was appreciative of CSW's support.  Patient/Family's Understanding of and Emotional  Response to Diagnosis, Current Treatment, and Prognosis:  Pt's step-daughter feels that pt would benefit from STR prior to returning to ALF.  Emotional Assessment Appearance:  Appears stated age Attitude/Demeanor/Rapport:   (Appropriate) Affect (typically observed):  Pleasant Orientation:  Oriented to Self, Oriented to Place Alcohol / Substance use:  Not Applicable Psych involvement (Current and /or in the community):  No (Comment)  Discharge Needs  Concerns to be addressed:  Adjustment to Illness Readmission within the last 30 days:  Yes Current discharge risk:  Chronically ill Barriers to Discharge:  Continued Medical Work up   [employer], LCSW 02/01/2017, 12:22 PM

## 2017-02-01 NOTE — Evaluation (Signed)
Physical Therapy Evaluation Patient Details Name: John Summers MRN: [id number] DOB: [birth year] Today's Date: 02/01/2017   History of Present Illness  John Summers is an 83yo white male who comes to [facility] from ALF after fall, AMS, and weakness: pt admitted for UTI. PMH: colon CA, HTN, Lewy body dementia, OSA, Lt THA (2002), AFib, MGUS, and RUE tremor. Pt was evaluated by PT here three weaks ago after admission for near-syncopal event. PLOF is taken largely from this visit 2WA as pt is with AMS and somewhat disoriented.   Clinical Impression  Pt admitted with above diagnosis. Pt currently with functional limitations due to the deficits listed below (see "PT Problem List"). Received with gown doffed and 3 of 5 telemetry leads removed. Pt with mild AMS and labile, hence functional mobility today is compared to PT evaluation here 3WA at admission. Pt able to AMB 46ft with RW at that time, but today with poor tolerance to standing, modA required for transfers from an elevated surface, and max-total assist for bed mobility. Pt occasionally c/o pain with mobility, but when asked, refers to surgical scar pain from 16 years ago, palpated today without acute pain. Orthostatic vital reveal a drop to 77/50 upon standing. Pt will benefit from skilled PT intervention to increase independence and safety with basic mobility in preparation for discharge to the venue listed below.       Follow Up Recommendations SNF;Supervision/Assistance - 24 hour    Equipment Recommendations  None recommended by PT    Recommendations for Other Services       Precautions / Restrictions Precautions Precautions: Fall      Mobility  Bed Mobility Overal bed mobility: Needs Assistance Bed Mobility: Supine to Sit;Sit to Supine     Supine to sit: Total assist;+2 for physical assistance;HOB elevated Sit to supine: Max assist;+2 for physical assistance      Transfers Overall transfer level: Needs  assistance Equipment used: 1 person hand held assist Transfers: Sit to/from Stand Sit to Stand: Mod assist         General transfer comment: slow, and requires time to abate posterior lean; c/o bilat hip pain after sitting for >5 minutes.  Ambulation/Gait Ambulation/Gait assistance:  (unable at this time. )              Stairs            Wheelchair Mobility    Modified Rankin (Stroke Patients Only)       Balance                                             Pertinent Vitals/Pain Pain Assessment:  (inconsistent reports/denial of pain due to AMS)    Home Living Family/patient expects to be discharged to:: Assisted living               Home Equipment: Walker - 4 wheels;Walker - 2 wheels      Prior Function Level of Independence: Needs assistance   Gait / Transfers Assistance Needed: Mod I amb in ALF with rollator  ADL's / Homemaking Assistance Needed: ALF assists with meals, ADLs, meds  Comments: some assistance due to dementia      Hand Dominance        Extremity/Trunk Assessment   Upper Extremity Assessment Upper Extremity Assessment: Generalized weakness;RUE deficits/detail RUE Deficits / Details: reports some pain in Rt shoulder with  attempt use during mobility.     Lower Extremity Assessment Lower Extremity Assessment: Generalized weakness       Communication   Communication: No difficulties  Cognition Arousal/Alertness: Awake/alert Behavior During Therapy:  (generally cooperative but intermittently labile, unless distracted with conversation. ) Overall Cognitive Status: History of cognitive impairments - at baseline Area of Impairment: Orientation                 Orientation Level: Person;Time (able to provide soem details about situation but ultimately confused. )             General Comments: AMS as baseline, but more confused at present moment according to EMR/prior PT notes.       General  Comments      Exercises     Assessment/Plan    PT Assessment Patient needs continued PT services  PT Problem List Decreased strength;Decreased cognition;Decreased activity tolerance;Decreased mobility;Decreased safety awareness       PT Treatment Interventions Gait training;Stair training;Functional mobility training;Patient/family education;Therapeutic activities;Therapeutic exercise;Balance training    PT Goals (Current goals can be found in the Care Plan section)  Acute Rehab PT Goals PT Goal Formulation: Patient unable to participate in goal setting    Frequency Min 2X/week   Barriers to discharge Decreased caregiver support unlcear of living situation, but may require a higher degree of care upon DC    Co-evaluation               AM-PAC PT "6 Clicks" Daily Activity  Outcome Measure Difficulty turning over in bed (including adjusting bedclothes, sheets and blankets)?: Total Difficulty moving from lying on back to sitting on the side of the bed? : Total Difficulty sitting down on and standing up from a chair with arms (e.g., wheelchair, bedside commode, etc,.)?: Total Help needed moving to and from a bed to chair (including a wheelchair)?: Total Help needed walking in hospital room?: Total Help needed climbing 3-5 steps with a railing? : Total 6 Click Score: 6    End of Session Equipment Utilized During Treatment: Gait belt Activity Tolerance: Patient limited by fatigue;Treatment limited secondary to agitation;Patient limited by pain (mildly aggitated adn labile with increased activity; poor tolerance to sustained sitting. ) Patient left: in bed;with call bell/phone within reach (4 rails elevated due to AMS; bed alarm is not working properly)   PT Visit Diagnosis: Muscle weakness (generalized) (M62.81);History of falling (Z91.81);Difficulty in walking, not elsewhere classified (R26.2)    Time: 5284-1324 PT Time Calculation (min) (ACUTE ONLY): 22 min   Charges:    PT Evaluation $PT Eval High Complexity: 1 Procedure PT Treatments $Therapeutic Activity: 8-22 mins   PT G Codes:   PT G-Codes **NOT FOR INPATIENT CLASS** Functional Assessment Tool Used: AM-PAC 6 Clicks Basic Mobility Functional Limitation: Changing and maintaining body position Changing and Maintaining Body Position Current Status (M0102): 100 percent impaired, limited or restricted Changing and Maintaining Body Position Goal Status (V2536): At least 40 percent but less than 60 percent impaired, limited or restricted   3:18 PM, 02/01/17 Etta Grandchild, PT, DPT Physical Therapist - [facility])  [phone] (mobile)    Shima Compere C 02/01/2017, 3:14 PM

## 2017-02-02 ENCOUNTER — Telehealth: Payer: Self-pay | Admitting: Family Medicine

## 2017-02-02 DIAGNOSIS — N390 Urinary tract infection, site not specified: Secondary | ICD-10-CM | POA: Diagnosis not present

## 2017-02-02 LAB — CBC
HCT: 30 % — ABNORMAL LOW (ref 40.0–52.0)
Hemoglobin: 10.3 g/dL — ABNORMAL LOW (ref 13.0–18.0)
MCH: 35.4 pg — AB (ref 26.0–34.0)
MCHC: 34.2 g/dL (ref 32.0–36.0)
MCV: 103.4 fL — ABNORMAL HIGH (ref 80.0–100.0)
Platelets: 70 10*3/uL — ABNORMAL LOW (ref 150–440)
RBC: 2.9 MIL/uL — ABNORMAL LOW (ref 4.40–5.90)
RDW: 13.7 % (ref 11.5–14.5)
WBC: 6 10*3/uL (ref 3.8–10.6)

## 2017-02-02 LAB — BASIC METABOLIC PANEL
Anion gap: 6 (ref 5–15)
BUN: 22 mg/dL — ABNORMAL HIGH (ref 6–20)
CALCIUM: 8.1 mg/dL — AB (ref 8.9–10.3)
CHLORIDE: 111 mmol/L (ref 101–111)
CO2: 24 mmol/L (ref 22–32)
CREATININE: 1.02 mg/dL (ref 0.61–1.24)
GFR calc non Af Amer: >60 mL/min (ref >60)
Glucose, Bld: 138 mg/dL — ABNORMAL HIGH (ref 65–99)
Potassium: 3.4 mmol/L — ABNORMAL LOW (ref 3.5–5.1)
SODIUM: 141 mmol/L (ref 135–145)

## 2017-02-02 LAB — URINE CULTURE

## 2017-02-02 MED ORDER — ALPRAZOLAM 0.5 MG PO TABS: 0.5000 mg | ORAL_TABLET | Freq: Every evening | ORAL | 0 refills | Status: DC | PRN

## 2017-02-02 MED ORDER — CEFUROXIME AXETIL 250 MG PO TABS: 250.0000 mg | ORAL_TABLET | Freq: Two times a day (BID) | ORAL | 0 refills | Status: AC

## 2017-02-02 MED ORDER — HYDROCODONE-ACETAMINOPHEN 5-325 MG PO TABS: 1.0000 | ORAL_TABLET | ORAL | 0 refills | Status: DC | PRN

## 2017-02-02 MED ORDER — DEXTROSE 5 % IV SOLN: 1.0000 g | INTRAVENOUS | Status: DC

## 2017-02-02 NOTE — Telephone Encounter (Signed)
Update.. After seeing him daughter felt he need to go to ER.Marland Kitchen Seen and evaluated.

## 2017-02-02 NOTE — Progress Notes (Signed)
Called report [place] Valetta Mole LPN

## 2017-02-02 NOTE — Progress Notes (Signed)
Complete set of vitals not obtained this am.  Pt was too sleepy and wouldn't cooperate.

## 2017-02-02 NOTE — Telephone Encounter (Signed)
[name] (pt daughter) called to let you know mr Brunn is going to [facility] for rehab

## 2017-02-02 NOTE — Telephone Encounter (Signed)
noted 

## 2017-02-02 NOTE — Clinical Social Work Placement (Signed)
   CLINICAL SOCIAL WORK PLACEMENT  NOTE  Date:  02/02/2017  Patient Details  Name: John Summers MRN: [id number] Date of Birth: 01/17/1933  Clinical Social Work is seeking post-discharge placement for this patient at the [place] level of care (*CSW will initial, date and re-position this form in  chart as items are completed):  Yes   Patient/family provided with [place] Work Department's list of facilities offering this level of care within the geographic area requested by the patient (or if unable, by the patient's family).  Yes   Patient/family informed of their freedom to choose among providers that offer the needed level of care, that participate in Medicare, Medicaid or managed care program needed by the patient, have an available bed and are willing to accept the patient.  Yes   Patient/family informed of [facility]'s ownership interest in [facility] and [facility], as well as of the fact that they are under no obligation to receive care at these facilities.  PASRR submitted to EDS on       PASRR number received on       Existing PASRR number confirmed on 02/01/17     FL2 transmitted to all facilities in geographic area requested by pt/family on 02/01/17     FL2 transmitted to all facilities within larger geographic area on       Patient informed that his/her managed care company has contracts with or will negotiate with certain facilities, including the following:        Yes   Patient/family informed of bed offers received.  Patient chooses bed at [facility]     Physician recommends and patient chooses bed at      Patient to be transferred to [facility] on 02/02/17.  Patient to be transferred to facility by [facility]     Patient family notified on 02/02/17 of transfer.  Name of family member notified:  Pt's daughter, [name]     PHYSICIAN       Additional Comment:     _______________________________________________ Darden Dates, LCSW 02/02/2017, 10:03 AM

## 2017-02-02 NOTE — Progress Notes (Signed)
Physical Therapy Treatment Patient Details Name: John Summers MRN: [id number] DOB: 10-09-1932 Today's Date: 02/02/2017    History of Present Illness John Summers is an 83yo white male who comes to [facility] from ALF after fall, AMS, and weakness: pt admitted for UTI. PMH: colon CA, HTN, Lewy body dementia, OSA, Lt THA (2002), AFib, MGUS, and RUE tremor. Pt was evaluated by PT here three weaks ago after admission for near-syncopal event. PLOF is taken largely from this visit 2WA as pt is with AMS and somewhat disoriented.     PT Comments    Pt initially refusing therapy but requesting bedpan.  Pt agreed to bedside commode.  To edge of bed with mod a x 1.  Sitting balance with supervision.  Stand pivot to/from bedside commode with mod a x 1.   BP's taken: 120/70 supine, 103/76 sitting, 98/58 once on commode, 91/63 after 3 minutes on commode.  Pulse remains in 70's throughout.  Pt with no c/o dizziness.  Max a x 2 to reposition in bed.    Pt continues to have pain BUE's, Right shoulder pain greatest.  Very tender to touch but no bruising or edema noted. X-rays reviewed and negative.   Follow Up Recommendations  SNF     Equipment Recommendations       Recommendations for Other Services       Precautions / Restrictions Precautions Precautions: Fall Precaution Comments: orthostatic    Mobility  Bed Mobility Overal bed mobility: Needs Assistance Bed Mobility: Supine to Sit;Sit to Supine     Supine to sit: Mod assist Sit to supine: Min assist;+2 for physical assistance   General bed mobility comments: max a + 2 to reposition  Transfers Overall transfer level: Needs assistance Equipment used: 1 person hand held assist Transfers: Sit to/from Stand Sit to Stand: Mod assist         General transfer comment: to/from commode at bedside  Ambulation/Gait             General Gait Details: deferred   Stairs            Wheelchair Mobility    Modified Rankin  (Stroke Patients Only)       Balance Overall balance assessment: Needs assistance Sitting-balance support: Feet supported Sitting balance-Leahy Scale: Fair     Standing balance support: Single extremity supported Standing balance-Leahy Scale: Fair                              Cognition Arousal/Alertness: Awake/alert Behavior During Therapy: WFL for tasks assessed/performed Overall Cognitive Status: History of cognitive impairments - at baseline                                        Exercises Other Exercises Other Exercises: to commode at bedside, pt with extended period of time on commode    General Comments        Pertinent Vitals/Pain Pain Assessment: 0-10 Pain Score: 8  Pain Location: B arms R shoulder worst Pain Descriptors / Indicators: Sore;Crying;Aching;Grimacing;Guarding Pain Intervention(s): Limited activity within patient's tolerance    Home Living                      Prior Function            PT Goals (current goals can now be found in  the care plan section)      Frequency    Min 2X/week      PT Plan Current plan remains appropriate    Co-evaluation              AM-PAC PT "6 Clicks" Daily Activity  Outcome Measure  Difficulty turning over in bed (including adjusting bedclothes, sheets and blankets)?: Total Difficulty moving from lying on back to sitting on the side of the bed? : Total Difficulty sitting down on and standing up from a chair with arms (e.g., wheelchair, bedside commode, etc,.)?: Total Help needed moving to and from a bed to chair (including a wheelchair)?: A Lot Help needed walking in hospital room?: Total Help needed climbing 3-5 steps with a railing? : Total 6 Click Score: 7    End of Session Equipment Utilized During Treatment: Gait belt Activity Tolerance: Patient limited by pain Patient left: in bed;with call bell/phone within reach;with bed alarm set;with family/visitor  present         Time: 0315-9458 PT Time Calculation (min) (ACUTE ONLY): 40 min  Charges:  $Therapeutic Exercise: 8-22 mins $Therapeutic Activity: 23-37 mins                    G Codes:       John Summers, PTA 02/02/17, 10:41 AM

## 2017-02-02 NOTE — Clinical Social Work Note (Signed)
CSW spoke to pt's daughter and provided bed offers. Pt's daughter chose [facility]. CSW notified facility and they are able to accept pt today. CSW updated MD as pt is ready for discharge today. CSW will continue to follow.   Darden Dates, MSW, LCSW  Clinical Social Worker  [phone]

## 2017-02-02 NOTE — Clinical Social Work Note (Signed)
Pt is ready for discharge today and will go to [facility] with Palliative Care following. Pt and family are aware and agreeable to discharge planning needs. Facility is ready to admit pt as they have received discharge information. RN called report. [facility] will provide transportation. CSW is signing off as no further needs identified.   Darden Dates, MSW, LCSW  Clinical Social Worker  [phone]

## 2017-02-02 NOTE — Discharge Summary (Signed)
[facility] NAME: John Summers    MR#:  [id number]  DATE OF BIRTH:  07/26/1933  DATE OF ADMISSION:  01/31/2017 ADMITTING PHYSICIAN: Dustin Flock, MD  DATE OF DISCHARGE: 02/02/2017  PRIMARY CARE PHYSICIAN: Owens Loffler, MD    ADMISSION DIAGNOSIS:  HTN, UTI  DISCHARGE DIAGNOSIS:  Principal Problem:   UTI (urinary tract infection) Active Problems:   Essential hypertension   GERD (gastroesophageal reflux disease)   AF (paroxysmal atrial fibrillation) (HCC)   AKI (acute kidney injury) ([place])   SECONDARY DIAGNOSIS:   Past Medical History:  Diagnosis Date  . Colon adenocarcinoma ([place])   . Complication of anesthesia    severe confusion  . GERD (gastroesophageal reflux disease)   . History of colon polyps   . History of gallstones   . Hx of transfusion of platelets   . Hypertension   . ITP (idiopathic thrombocytopenic purpura)    Dr. Grayland Ormond follows  . Kidney stones   . Lewy body dementia with behavioral disturbance   . Major depressive disorder, recurrent episode, in partial remission with anxious distress ([place])   . Pancreatitis 2/11  . Pollen allergies   . Skin cancer    ears  . Sleep apnea    "won't wear mask anymore" (12/22/2012)  . Unspecified essential hypertension     HOSPITAL COURSE:   83 year old male with past medical history of ITP, hypertension, GERD, history of colon cancer, depression, obstructive sleep apnea, dementia who presents to the hospital due to falls, weakness and noted to have urinary tract infection.  1. Urinary tract infection-this was based on a positive urinalysis.  Pt. Was treated with IV Ceftriaxone in the hospital and now being discharged on oral Ceftin.   - pt's urine cultures grew less than 10,000 col of gram (-) rod.  Clinically afebrile, with normal WBC count and hemodynamically stable and will d/c to SNF today.   2. Weakness/fall- Suspect to be secondary to the UTI,  deconditioning. - seen by PT and they recommend SNF and he is being discharged there presently.   3. Acute kidney injury-secondary to hypotension and dehydration.  - improved and resolved w/ IV fluid hydration.   4. History of ITP-platelet count remained stable. No acute bleeding.  5. Elevated troponin-likely supply demand ischemia secondary to acute kidney injury. No acute chest pain, no arrhythmias on telemetry. - Echo done but results pending and can be followed up as outpatient.   - pt. May benefit from outpatient loop monitor as discussed w/ family and they will arrange with cardiologist as outpatient.   6. Dementia-continue Aricept.  7. Depression - cont. Prozac.   8. Neck Pain - pt. Underwent X-ray of cervical spine, thoracic spine which showed no acute fracture but DJD.  CT Cervical spine also showing DJD but no acute fracture. Follow up as outpatient with Ortho and supportive care for now.   Palliative care to follow pt. At the SNF.  D/c to SNF today.   DISCHARGE CONDITIONS:   Stable  CONSULTS OBTAINED:    DRUG ALLERGIES:   Allergies  Allergen Reactions  . Aspirin   . Chicken Allergy Nausea And Vomiting    Unknown   . Other Nausea And Vomiting    [place] per daughter     DISCHARGE MEDICATIONS:   Allergies as of 02/02/2017      Reactions   Aspirin    Chicken Allergy Nausea And Vomiting   Unknown   Other Nausea  And Vomiting   [place] per daughter       Medication List    STOP taking these medications   clonazePAM 0.5 MG tablet Commonly known as:  KLONOPIN   triamterene-hydrochlorothiazide 37.5-25 MG tablet Commonly known as:  MAXZIDE-25     TAKE these medications   acetaminophen 500 MG tablet Commonly known as:  TYLENOL Take 2 tablets (1,000 mg total) by mouth 4 (four) times daily as needed (for back pain).   ALPRAZolam 0.5 MG tablet Commonly known as:  XANAX Take 1 tablet (0.5 mg total) by mouth at bedtime as needed for anxiety.    cefUROXime 250 MG tablet Commonly known as:  CEFTIN Take 1 tablet (250 mg total) by mouth 2 (two) times daily with a meal.   donepezil 10 MG tablet Commonly known as:  ARICEPT take 1 tablet by mouth once daily   FLUoxetine 20 MG tablet Commonly known as:  PROZAC Take 1 tablet (20 mg total) by mouth daily.   HYDROcodone-acetaminophen 5-325 MG tablet Commonly known as:  NORCO/VICODIN Take 1 tablet by mouth every 4 (four) hours as needed for moderate pain.   hydrOXYzine 10 MG tablet Commonly known as:  ATARAX/VISTARIL take 1 tablet by mouth at bedtime         DISCHARGE INSTRUCTIONS:   DIET:  Regular diet  DISCHARGE CONDITION:  Stable  ACTIVITY:  Activity as tolerated  OXYGEN:  Home Oxygen: No.   Oxygen Delivery: room air  DISCHARGE LOCATION:  nursing home   If you experience worsening of your admission symptoms, develop shortness of breath, life threatening emergency, suicidal or homicidal thoughts you must seek medical attention immediately by calling 911 or calling your MD immediately  if symptoms less severe.  You Must read complete instructions/literature along with all the possible adverse reactions/side effects for all the Medicines you take and that have been prescribed to you. Take any new Medicines after you have completely understood and accpet all the possible adverse reactions/side effects.   Please note  You were cared for by a hospitalist during your hospital stay. If you have any questions about your discharge medications or the care you received while you were in the hospital after you are discharged, you can call the unit and asked to speak with the hospitalist on call if the hospitalist that took care of you is not available. Once you are discharged, your primary care physician will handle any further medical issues. Please note that NO REFILLS for any discharge medications will be authorized once you are discharged, as it is imperative that you  return to your primary care physician (or establish a relationship with a primary care physician if you do not have one) for your aftercare needs so that they can reassess your need for medications and monitor your lab values.     Today   Feels better today.  No acute events overnight.  Afebrile, hemodynamically stable.   VITAL SIGNS:  Blood pressure 137/82, pulse 72, temperature 99.9 F (37.7 C), temperature source Axillary, resp. rate 20, height 5\' 9"  (1.753 m), weight 90.5 kg (199 lb 8 oz), SpO2 95 %.  I/O:   Intake/Output Summary (Last 24 hours) at 02/02/17 1120 Last data filed at 02/01/17 1500  Gross per 24 hour  Intake               50 ml  Output                0 ml  Net  50 ml    PHYSICAL EXAMINATION:   GENERAL:  83 y.o.-year-old patient lying in bed in no acute distress.  EYES: Pupils equal, round, reactive to light and accommodation. No scleral icterus. Extraocular muscles intact.  HEENT: Head atraumatic, normocephalic. Oropharynx and nasopharynx clear.  NECK:  Supple, no jugular venous distention. No thyroid enlargement, no tenderness.  LUNGS: Normal breath sounds bilaterally, no wheezing, rales, rhonchi. No use of accessory muscles of respiration.  CARDIOVASCULAR: S1, S2 normal. No murmurs, rubs, or gallops.  ABDOMEN: Soft, nontender, nondistended. Bowel sounds present. No organomegaly or mass.  EXTREMITIES: No cyanosis, clubbing or edema b/l.    NEUROLOGIC: Cranial nerves II through XII are intact. No focal Motor or sensory deficits b/l.  Globally weak.  PSYCHIATRIC: The patient is alert and oriented x 3.   SKIN: No obvious rash, lesion, or ulcer.   DATA REVIEW:   CBC  Recent Labs Lab 02/02/17 0905  WBC 6.0  HGB 10.3*  HCT 30.0*  PLT 70*    Chemistries   Recent Labs Lab 02/02/17 0905  NA 141  K 3.4*  CL 111  CO2 24  GLUCOSE 138*  BUN 22*  CREATININE 1.02  CALCIUM 8.1*    Cardiac Enzymes  Recent Labs Lab 02/01/17 1253   TROPONINI 0.05*    Microbiology Results  Results for orders placed or performed during the hospital encounter of 01/31/17  Urine culture     Status: Abnormal   Collection Time: 02/01/17  6:30 AM  Result Value Ref Range Status   Specimen Description URINE, RANDOM  Final   Special Requests NONE  Final   Culture (A)  Final    <10,000 COLONIES/mL INSIGNIFICANT GROWTH Performed at [facility], [place] [address] [place], [place] [address]    Report Status 02/02/2017 FINAL  Final    RADIOLOGY:  Dg Cervical Spine Complete  Result Date: 02/01/2017 CLINICAL DATA:  Neck pain EXAM: CERVICAL SPINE - COMPLETE 4+ VIEW COMPARISON:  None. FINDINGS: Diffuse degenerative facet disease bilaterally. Degenerative disc disease at C5-6 and C6-7. 2 mm anterolisthesis of C4 on C5. No fracture. Prevertebral soft tissues are normal. IMPRESSION: Degenerative disc and facet disease. Slight anterolisthesis of C4 on C5. No acute findings. Electronically Signed   By: Rolm Baptise M.D.   On: 02/01/2017 11:45   Dg Thoracic Spine 2 View  Result Date: 02/01/2017 CLINICAL DATA:  Neck and upper back pain.  Fall several weeks ago. EXAM: THORACIC SPINE 2 VIEWS COMPARISON:  Chest x-ray 01/07/2017 FINDINGS: Degenerative spurring.  No fracture.  Alignment is normal. IMPRESSION: No acute findings. Electronically Signed   By: Rolm Baptise M.D.   On: 02/01/2017 11:45   Dg Shoulder Right  Result Date: 02/01/2017 CLINICAL DATA:  Right shoulder pain without known injury. EXAM: RIGHT SHOULDER - 2+ VIEW COMPARISON:  None. FINDINGS: There is no evidence of fracture or dislocation. Severe narrowing of right acromioclavicular joint is noted. Soft tissues are unremarkable. IMPRESSION: Severe degenerative joint disease of right acromioclavicular joint. No acute abnormality seen in the right shoulder. Electronically Signed   By: Marijo Conception, M.D.   On: 02/01/2017 08:33   Ct Cervical Spine Wo Contrast  Result Date:  02/01/2017 CLINICAL DATA:  Previous fall with back pain, initial encounter EXAM: CT CERVICAL SPINE WITHOUT CONTRAST TECHNIQUE: Multidetector CT imaging of the cervical spine was performed without intravenous contrast. Multiplanar CT image reconstructions were also generated. COMPARISON:  Plain films from earlier in the same day. FINDINGS: Alignment: Mild anterolisthesis  of C4 on C5 is noted similar to that seen on prior plain film examination. Skull base and vertebrae: 7 cervical segments are well visualized. Vertebral body height is well maintained. Disc space narrowing is noted at C5-6 and C6-7 with mild osteophytic changes. Facet hypertrophic changes are noted as well contributing to the anterolisthesis previously described. Bilateral neural foraminal narrowing is seen no acute fracture or acute facet abnormality is noted. Soft tissues and spinal canal: No soft tissue abnormality is seen. Upper chest:  No acute abnormality noted. IMPRESSION: Degenerative change similar to that seen on prior exam. No acute abnormality is noted. Mild degenerative anterolisthesis of C4 on C5 is noted. Electronically Signed   By: Inez Catalina M.D.   On: 02/01/2017 17:56      Management plans discussed with the patient, family and they are in agreement.  CODE STATUS:     Code Status Orders        Start     Ordered   02/01/17 0029  Full code  Continuous     02/01/17 0031    Advance Directive Documentation     Most Recent Value  Type of Advance Directive  Living will, Healthcare Power of Attorney  Pre-existing out of facility DNR order (yellow form or pink MOST form)  -  "MOST" Form in Place?  -      TOTAL TIME TAKING CARE OF THIS PATIENT: 40 minutes.    Henreitta Leber M.D on 02/02/2017 at 11:20 AM  Between 7am to 6pm - Pager - [phone]  After 6pm go to [email or web address] - Proofreader  [facility]  Office  [phone]  CC: Primary care physician; Owens Loffler,  MD

## 2017-02-03 ENCOUNTER — Encounter: Payer: Self-pay | Admitting: Internal Medicine

## 2017-02-03 ENCOUNTER — Non-Acute Institutional Stay (SKILLED_NURSING_FACILITY): Payer: Medicare Other | Admitting: Internal Medicine

## 2017-02-03 DIAGNOSIS — D638 Anemia in other chronic diseases classified elsewhere: Secondary | ICD-10-CM

## 2017-02-03 DIAGNOSIS — N3000 Acute cystitis without hematuria: Secondary | ICD-10-CM | POA: Diagnosis not present

## 2017-02-03 DIAGNOSIS — N179 Acute kidney failure, unspecified: Secondary | ICD-10-CM | POA: Diagnosis not present

## 2017-02-03 DIAGNOSIS — R531 Weakness: Secondary | ICD-10-CM

## 2017-02-03 DIAGNOSIS — F329 Major depressive disorder, single episode, unspecified: Secondary | ICD-10-CM

## 2017-02-03 DIAGNOSIS — G3183 Dementia with Lewy bodies: Secondary | ICD-10-CM

## 2017-02-03 DIAGNOSIS — F419 Anxiety disorder, unspecified: Secondary | ICD-10-CM

## 2017-02-03 DIAGNOSIS — M542 Cervicalgia: Secondary | ICD-10-CM

## 2017-02-03 DIAGNOSIS — F0281 Dementia in other diseases classified elsewhere with behavioral disturbance: Secondary | ICD-10-CM

## 2017-02-03 DIAGNOSIS — I959 Hypotension, unspecified: Secondary | ICD-10-CM | POA: Diagnosis not present

## 2017-02-03 DIAGNOSIS — E876 Hypokalemia: Secondary | ICD-10-CM

## 2017-02-03 DIAGNOSIS — F02818 Dementia in other diseases classified elsewhere, unspecified severity, with other behavioral disturbance: Secondary | ICD-10-CM

## 2017-02-03 DIAGNOSIS — F32A Depression, unspecified: Secondary | ICD-10-CM

## 2017-02-03 DIAGNOSIS — D696 Thrombocytopenia, unspecified: Secondary | ICD-10-CM | POA: Diagnosis not present

## 2017-02-03 NOTE — Progress Notes (Signed)
LOCATION: [facility]  PCP: Owens Loffler, MD   Code Status: Full Code  Goals of care: Advanced Directive information Advanced Directives 01/07/2017  Does Patient Have a Medical Advance Directive? Yes  Type of Paramedic of [place];Living will  Does patient want to make changes to medical advance directive? No - Patient declined  Copy of [place] in Chart? No - copy requested  Pre-existing out of facility DNR order (yellow form or pink MOST form) -  Some encounter information is confidential and restricted. Go to Review Flowsheets activity to see all data.       Extended Emergency Contact Information Primary Emergency Contact: Toribio Harbour, [place] [address] [place] [employer] Phone: [phone] Relation: Daughter   Allergies  Allergen Reactions  . Aspirin   . Chicken Allergy Nausea And Vomiting    Unknown   . Other Nausea And Vomiting    [place] per daughter     Chief Complaint  Patient presents with  . New Admit To SNF    New Admission Visit      HPI:  Patient is a 83 y.o. male seen today for short term rehabilitation post hospital admission from 01/31/17-02/02/17 with generalized weakness, confusion and hypotension. He was treated for UTI and AKI with iv fluids and iv antibiotics. He had elevated troponin level thought to be from demand ischemia with AKI. He had complaints of neck pain and imaging did not show any acute fracture. There were changes suggestive of DJD. He has medical history of dementia, ITP, depression, GERD, HTN. He is seen in his room with his stepson at bedside. He was residing at ALF prior to this.   Review of Systems:  Constitutional: Negative for fever, chills. He feels weak and tired. HENT: Negative for  congestion, nasal discharge, sore throat, difficulty swallowing. Positive for headache.  Eyes: Negative for blurred vision, double vision and discharge. he wears  corrective glasses.  Respiratory: Negative for shortness of breath. Positive for occasional cough.   Cardiovascular: Negative for chest pain, palpitation.  Gastrointestinal: Negative for heartburn, nausea, vomiting, abdominal pain. Last bowel movement was this morning. Genitourinary: Negative for dysuria and flank pain. Incomplete emptying of his bladder. Musculoskeletal: Negative for back pain, fall in the facility. Positive for neck pain  Skin: Negative for itching, rash.  Neurological: Positive for dizziness with change of position. Psychiatric/Behavioral: Negative for depression. Positive for short term memory loss per family.    Past Medical History:  Diagnosis Date  . Colon adenocarcinoma ([place])   . Complication of anesthesia    severe confusion  . GERD (gastroesophageal reflux disease)   . History of colon polyps   . History of gallstones   . Hx of transfusion of platelets   . Hypertension   . ITP (idiopathic thrombocytopenic purpura)    Dr. Grayland Ormond follows  . Kidney stones   . Lewy body dementia with behavioral disturbance   . Major depressive disorder, recurrent episode, in partial remission with anxious distress ([facility])   . Pancreatitis 2/11  . Pollen allergies   . Skin cancer    ears  . Sleep apnea    "won't wear mask anymore" (12/22/2012)  . Unspecified essential hypertension    Past Surgical History:  Procedure Laterality Date  . CARDIAC CATHETERIZATION  2000's   "tx'd w/acid reflux RX" (12/22/2012)  . CHOLECYSTECTOMY  2011  . ESOPHAGEAL DILATION  1990's?  Marland Kitchen HERNIA  REPAIR  12/22/2012   IHR w/mesh  . INGUINAL HERNIA REPAIR Left [id number]  . INSERTION OF MESH N/A 12/22/2012   Procedure: INSERTION OF MESH;  Surgeon: Harl Bowie, MD;  Location: [facility];  Service: General;  Laterality: N/A;  . LITHOTRIPSY  2008   x2  . PARTIAL COLECTOMY  03/21/2012   Procedure: PARTIAL COLECTOMY;  Surgeon: Harl Bowie, MD;  Location: [facility];  Service: General;  Laterality:  Right;  . PILONIDAL CYST EXCISION  1960's?  Marland Kitchen SKIN CANCER EXCISION     "face; ?left ear" (12/22/2012)  . TONSILLECTOMY AND ADENOIDECTOMY  1946  . TOTAL HIP ARTHROPLASTY Left ~ 2002  . UPPER GASTROINTESTINAL ENDOSCOPY    . VENTRAL HERNIA REPAIR N/A 12/22/2012   Procedure: HERNIA REPAIR INCISIONAL ;  Surgeon: Harl Bowie, MD;  Location: [facility];  Service: General;  Laterality: N/A;   Social History:   reports that he has quit smoking. His smoking use included Pipe. He has never used smokeless tobacco. He reports that he does not drink alcohol or use drugs.  Family History  Problem Relation Age of Onset  . Colon cancer Mother   . Heart disease Mother   . Hypertension Mother   . Alcohol abuse Father   . Esophageal cancer Neg Hx   . Rectal cancer Neg Hx   . Stomach cancer Neg Hx     Medications: Allergies as of 02/03/2017      Reactions   Aspirin    Chicken Allergy Nausea And Vomiting   Unknown   Other Nausea And Vomiting   [place] per daughter       Medication List       Accurate as of 02/03/17 12:07 PM. Always use your most recent med list.          acetaminophen 500 MG tablet Commonly known as:  TYLENOL Take 2 tablets (1,000 mg total) by mouth 4 (four) times daily as needed (for back pain).   ALPRAZolam 0.5 MG tablet Commonly known as:  XANAX Take 1 tablet (0.5 mg total) by mouth at bedtime as needed for anxiety.   cefUROXime 250 MG tablet Commonly known as:  CEFTIN Take 1 tablet (250 mg total) by mouth 2 (two) times daily with a meal.   donepezil 10 MG tablet Commonly known as:  ARICEPT take 1 tablet by mouth once daily   FLUoxetine 20 MG tablet Commonly known as:  PROZAC Take 1 tablet (20 mg total) by mouth daily.   HYDROcodone-acetaminophen 5-325 MG tablet Commonly known as:  NORCO/VICODIN Take 1 tablet by mouth every 4 (four) hours as needed for moderate pain.   hydrOXYzine 10 MG tablet Commonly known as:  ATARAX/VISTARIL take 1 tablet by mouth at  bedtime       Immunizations: Immunization History  Administered Date(s) Administered  . Influenza Split 08/20/2011  . Influenza,inj,Quad PF,36+ Mos 06/21/2013, 05/03/2014, 08/01/2015, 11/09/2016  . PPD Test 12/09/2016  . Pneumococcal Conjugate-13 05/03/2014  . Pneumococcal Polysaccharide-23 11/11/2011, 02/21/2012     Physical Exam: Vitals:   02/03/17 1201  BP: 115/63  Pulse: 72  Resp: 16  Temp: 98.4 F (36.9 C)  TempSrc: Oral  SpO2: 98%  Weight: 199 lb (90.3 kg)  Height: 5\' 10"  (1.778 m)   Body mass index is 28.55 kg/m.  General- elderly male, well built, in no acute distress Head- normocephalic, atraumatic Nose- no nasal discharge Throat- moist mucus membrane, normal oropharynx Eyes- PERRLA, EOMI, no pallor, no icterus, no discharge, normal  conjunctiva, normal sclera Neck- no cervical lymphadenopathy Cardiovascular- normal s1,s2, no murmur Respiratory- bilateral clear to auscultation, no wheeze, no rhonchi, no crackles, no use of accessory muscles Abdomen- bowel sounds present, soft, non tender, no guarding or rigidity Musculoskeletal- able to move all 4 extremities, generalized weakness, trace leg edema, no cervical spine tenderness Neurological- alert and oriented to person, place and time Skin- warm and dry Psychiatry- normal mood and affect    Labs reviewed: Basic Metabolic Panel:  Recent Labs  01/07/17 0058 02/01/17 0641 02/02/17 0905  NA 140 142 141  K 3.2* 3.4* 3.4*  CL 103 113* 111  CO2 28 25 24   GLUCOSE 101* 95 138*  BUN 31* 25* 22*  CREATININE 1.23 1.30* 1.02  CALCIUM 8.4* 7.8* 8.1*  MG 1.9  --   --   PHOS 3.3  --   --    Liver Function Tests:  Recent Labs  11/09/16 1302 11/14/16 1405 01/06/17 1852  AST 16 28 28   ALT 13 14* 13*  ALKPHOS 42 41 47  BILITOT 0.5 1.2 0.9  PROT 7.2 8.1 7.9  ALBUMIN 4.0 4.3 4.2    Recent Labs  04/24/16 1617 11/14/16 1405 01/06/17 1852  LIPASE 30 26 68*   No results for input(s): AMMONIA in  the last 8760 hours. CBC:  Recent Labs  05/06/16 1406 11/09/16 1302  01/06/17 1852 01/07/17 0058 02/01/17 0641 02/02/17 0905  WBC 2.7* 3.1*  < > 3.6* 3.4* 6.8 6.0  NEUTROABS 1.1* 1.5  --  1.4  --   --   --   HGB 12.7* 12.7*  < > 12.9* 12.2* 9.9* 10.3*  HCT 36.1* 36.9*  < > 36.6* 34.6* 27.7* 30.0*  MCV 102.0* 105.5 Repeated and verified X2.*  < > 104.4* 105.1* 104.5* 103.4*  PLT 103* 106.0*  < > 104* 101* 62* 70*  < > = values in this interval not displayed. Cardiac Enzymes:  Recent Labs  02/01/17 0042 02/01/17 0641 02/01/17 1253  TROPONINI 0.07* 0.06* 0.05*   BNP: Invalid input(s): POCBNP CBG:  Recent Labs  01/07/17 0513  GLUCAP 89    Radiological Exams: X-ray Chest Pa And Lateral  Result Date: 01/07/2017 CLINICAL DATA:  83 year old male with a history of syncope. EXAM: CHEST  2 VIEW COMPARISON:  12/08/2012, 11/14/2016 FINDINGS: Cardiomediastinal silhouette unchanged. No evidence of central vascular congestion. Calcifications of the aortic arch. Low lung volumes persist with interstitial opacities with nonspecific pattern. No pleural effusion or pneumothorax. No confluent airspace disease. IMPRESSION: Low lung volumes with likely chronic changes and no evidence of acute cardiopulmonary disease. Aortic atherosclerosis. Electronically Signed   By: Corrie Mckusick D.O.   On: 01/07/2017 08:16   Dg Cervical Spine Complete  Result Date: 02/01/2017 CLINICAL DATA:  Neck pain EXAM: CERVICAL SPINE - COMPLETE 4+ VIEW COMPARISON:  None. FINDINGS: Diffuse degenerative facet disease bilaterally. Degenerative disc disease at C5-6 and C6-7. 2 mm anterolisthesis of C4 on C5. No fracture. Prevertebral soft tissues are normal. IMPRESSION: Degenerative disc and facet disease. Slight anterolisthesis of C4 on C5. No acute findings. Electronically Signed   By: Rolm Baptise M.D.   On: 02/01/2017 11:45   Dg Thoracic Spine 2 View  Result Date: 02/01/2017 CLINICAL DATA:  Neck and upper back pain.   Fall several weeks ago. EXAM: THORACIC SPINE 2 VIEWS COMPARISON:  Chest x-ray 01/07/2017 FINDINGS: Degenerative spurring.  No fracture.  Alignment is normal. IMPRESSION: No acute findings. Electronically Signed   By: Rolm Baptise M.D.  On: 02/01/2017 11:45   Dg Shoulder Right  Result Date: 02/01/2017 CLINICAL DATA:  Right shoulder pain without known injury. EXAM: RIGHT SHOULDER - 2+ VIEW COMPARISON:  None. FINDINGS: There is no evidence of fracture or dislocation. Severe narrowing of right acromioclavicular joint is noted. Soft tissues are unremarkable. IMPRESSION: Severe degenerative joint disease of right acromioclavicular joint. No acute abnormality seen in the right shoulder. Electronically Signed   By: Marijo Conception, M.D.   On: 02/01/2017 08:33   Ct Head Wo Contrast  Result Date: 01/06/2017 CLINICAL DATA:  Syncopal episode EXAM: CT HEAD WITHOUT CONTRAST TECHNIQUE: Contiguous axial images were obtained from the base of the skull through the vertex without intravenous contrast. COMPARISON:  MRI 01/26/2014 FINDINGS: Brain: Mild-to-moderate superficial atrophy with chronic appearing small vessel ischemic disease of periventricular white matter. Idiopathic basal ganglial calcifications are noted bilaterally. No intracranial mass, hemorrhage or midline shift. No extra-axial fluid collections. Vascular: No hyperdense vessels. Mild carotid siphon calcifications bilaterally. Skull: Normal. Negative for fracture or focal lesion. Sinuses/Orbits: Normal bilateral orbits and globes. Minimal ethmoid sinus mucosal thickening. Mastoids are clear. Other: None IMPRESSION: Superficial atrophy. Chronic appearing small vessel ischemic disease of periventricular white matter. No acute intracranial abnormality. Electronically Signed   By: Ashley Royalty M.D.   On: 01/06/2017 21:45   Ct Cervical Spine Wo Contrast  Result Date: 02/01/2017 CLINICAL DATA:  Previous fall with back pain, initial encounter EXAM: CT CERVICAL  SPINE WITHOUT CONTRAST TECHNIQUE: Multidetector CT imaging of the cervical spine was performed without intravenous contrast. Multiplanar CT image reconstructions were also generated. COMPARISON:  Plain films from earlier in the same day. FINDINGS: Alignment: Mild anterolisthesis of C4 on C5 is noted similar to that seen on prior plain film examination. Skull base and vertebrae: 7 cervical segments are well visualized. Vertebral body height is well maintained. Disc space narrowing is noted at C5-6 and C6-7 with mild osteophytic changes. Facet hypertrophic changes are noted as well contributing to the anterolisthesis previously described. Bilateral neural foraminal narrowing is seen no acute fracture or acute facet abnormality is noted. Soft tissues and spinal canal: No soft tissue abnormality is seen. Upper chest:  No acute abnormality noted. IMPRESSION: Degenerative change similar to that seen on prior exam. No acute abnormality is noted. Mild degenerative anterolisthesis of C4 on C5 is noted. Electronically Signed   By: Inez Catalina M.D.   On: 02/01/2017 17:56   Ct Abdomen Pelvis W Contrast  Result Date: 01/06/2017 CLINICAL DATA:  Syncopal episode.  Diarrhea for the last 2-3 days. EXAM: CT ABDOMEN AND PELVIS WITH CONTRAST TECHNIQUE: Multidetector CT imaging of the abdomen and pelvis was performed using the standard protocol following bolus administration of intravenous contrast. CONTRAST:  94mL ISOVUE-300 IOPAMIDOL (ISOVUE-300) INJECTION 61% COMPARISON:  04/24/2016 CT FINDINGS: Lower chest: Borderline cardiomegaly with coronary arteriosclerosis. No pericardial effusion. Dependent atelectasis at each lung base. Hepatobiliary: No focal liver abnormality is seen. Status post cholecystectomy. No biliary dilatation. Pancreas: Smaller pancreatic body cystic mass measuring 1.2 cm versus 2.3 cm previously. No significant ductal dilatation. No additional pancreatic mass or inflammation. Spleen: Normal in size without  focal abnormality. Adrenals/Urinary Tract: Normal bilateral adrenal glands. No obstructive uropathy. Punctate calculus the interpolar left kidney. No solid enhancing renal mass. Urinary bladder is physiologically distended. Streak artifacts from the patient's left hip arthroplasty slightly limit further assessment especially along the posterior dependent wall were there are subtle hyperdensities believed to be due to streak artifacts although the potential for tiny layering calculi is  not entirely excluded. Stomach/Bowel: The stomach is distended with contrast and food. Normal small bowel rotation is seen. Stable small to moderate hiatal hernia. Large ventral abdominal wall defect is again seen containing small bowel loops unchanged in appearance. The patient is status post subtotal right hemicolectomy with intact appearing ileocolic anastomosis in the right upper quadrant. A moderate amount of fecal residue is seen within the remainder of the large intestine. Mild hazy perirectal fat is likely due in part due to the streak artifacts from the hip arthroplasty and less likely due to proctitis. No significant wall thickening is noted. Vascular/Lymphatic: Atherosclerosis of the non aneurysmal abdominal aorta. 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 Patent portal, splenic, hepatic and renal veins. Reproductive: Stable enlarged prostate measuring 5.2 x 4.7 cm. Other:  No pneumoperitoneum, ascites or focal fluid. Musculoskeletal: No aggressive appearing focal osseous lesions. Left hip arthroplasty. Thoracolumbar spondylosis. IMPRESSION: 1. Stable small bowel and fat containing umbilical hernia. 2. Smaller pancreatic body cystic mass now estimated at 1.2 cm versus 2.3 cm previously. 3. Status post subtotal right hemicolectomy. No acute bowel inflammation. 4. Cardiomegaly with coronary arteriosclerosis. Aortic atherosclerosis. 5. Stable prostatomegaly. Electronically Signed   By: Ashley Royalty M.D.   On: 01/06/2017  21:43    Assessment/Plan  Generalized weakness From deconditioning. Will have him work with physical therapy and occupational therapy team to help with gait training and muscle strengthening exercises.fall precautions. Skin care. Encourage to be out of bed.   UTI Hydration encouraged. Perineal hygiene. Continue and complete cefuroxime 250 mg bid until 02/05/17.   Cervical pain From DJD, continue tylenol 1000 mg qid prn with hydrocodone-apap 5-325 mg q4h prn pain. PMR consult. Will have patient work with PT/OT as tolerated to regain strength and restore function.  Fall precautions are in place.  AKI S/p iv fluids. Monitor bmp  Hypotension According to step son has drop in BP with change of position. No mention regarding orthostasis in hospital note. Check orthostatic vitals here. If noted to be orthostatic, will need further workup. Hydration and slow position change encouraged. Vital signs stable on review at present.   Anemia of chronic disease Monitor cbc periodically  Thrombocytopenia No bleed reported, monitor platelet count  Hypokalemia Check bmp  lewy body dementia Continue donepezil 10 mg daily, provide supportive care.   Anxiety Continue hydroxyzine at bedtime to help with his anxiety. Continue alprazolam 0.5 mg qhs prn  Depression Continue fluoxetine and monitor mood   Goals of care: short term rehabilitation   Labs/tests ordered: cbc, bmp 5/29  Family/ staff Communication: reviewed care plan with patient, his step son and nursing supervisor    Blanchie Serve, MD Internal Medicine [address], [place] [address] Cell Phone (Monday-Friday 8 am - 5 pm): [phone] On Call: [phone] and follow prompts after 5 pm and on weekends Office Phone: [phone] Office Fax: [phone]

## 2017-02-09 LAB — BASIC METABOLIC PANEL
BUN: 20 mg/dL (ref 4–21)
Creatinine: 0.7 mg/dL (ref 0.6–1.3)
GLUCOSE: 105 mg/dL
Potassium: 3.4 mmol/L (ref 3.4–5.3)
SODIUM: 146 mmol/L (ref 137–147)

## 2017-02-09 LAB — CBC AND DIFFERENTIAL
HCT: 26 % — AB (ref 41–53)
HEMOGLOBIN: 8.6 g/dL — AB (ref 13.5–17.5)
PLATELETS: 103 10*3/uL — AB (ref 150–399)
WBC: 2.7 10^3/mL

## 2017-02-09 LAB — HEPATIC FUNCTION PANEL
ALT: 9 U/L — AB (ref 10–40)
AST: 11 U/L — AB (ref 14–40)
Alkaline Phosphatase: 55 U/L (ref 25–125)
BILIRUBIN, TOTAL: 0.4 mg/dL

## 2017-02-11 ENCOUNTER — Non-Acute Institutional Stay (SKILLED_NURSING_FACILITY): Payer: Medicare Other | Admitting: Family

## 2017-02-11 ENCOUNTER — Encounter: Payer: Self-pay | Admitting: Family

## 2017-02-11 DIAGNOSIS — E8809 Other disorders of plasma-protein metabolism, not elsewhere classified: Secondary | ICD-10-CM | POA: Diagnosis not present

## 2017-02-11 DIAGNOSIS — E876 Hypokalemia: Secondary | ICD-10-CM

## 2017-02-11 DIAGNOSIS — E440 Moderate protein-calorie malnutrition: Secondary | ICD-10-CM | POA: Diagnosis not present

## 2017-02-11 MED ORDER — POTASSIUM CHLORIDE CRYS ER 20 MEQ PO TBCR: 40.0000 meq | EXTENDED_RELEASE_TABLET | Freq: Every day | ORAL | 3 refills | Status: DC

## 2017-02-11 MED ORDER — CALCIUM CARBONATE-VITAMIN D 500-200 MG-UNIT PO TABS: 1.0000 | ORAL_TABLET | Freq: Every day | ORAL | Status: AC

## 2017-02-11 NOTE — Progress Notes (Signed)
Location:  [facility] Room Number: 4970Y Place of Service:  SNF (31) Provider: Anevay Campanella FNP-C  Owens Loffler, MD  Patient Care Team: Owens Loffler, MD as PCP - General (Family Medicine) Hooten, Laurice Record, MD as Consulting Physician (Orthopedic Surgery) Christene Lye, MD as Consulting Physician (General Surgery) Lloyd Huger, MD as Consulting Physician (Hematology and Oncology) Abbie Sons, MD as Consulting Physician (Urology)  Extended Emergency Contact Information Primary Emergency Contact: [facility] Address: [address]          [place], [place] [address] Johnnette Litter of [place] Phone: [phone] Relation: Daughter Secondary Emergency Contact: Delmer Islam [place] of [place] Phone: [phone] Relation: Son  Code Status:  Full Code  Goals of care: Advanced Directive information Advanced Directives 02/11/2017  Does Patient Have a Medical Advance Directive? No  Type of Advance Directive -  Does patient want to make changes to medical advance directive? -  Copy of [place] in Chart? -  Pre-existing out of facility DNR order (yellow form or pink MOST form) -  Some encounter information is confidential and restricted. Go to Review Flowsheets activity to see all data.     Chief Complaint  Patient presents with  . Acute Visit    abnormal labs    HPI:  Pt is a 83 y.o. male seen today at [facility] and rehabilitation for an acute visit for evaluation of abnormal lab results. He is seen in his room today. His recent lab results showed K+3.4, TP 5.4,ALB 2.75, Ca 7.8 (02/09/2017). He is forgetful at times. Facility Nurse reports hip pain. He was seen by PMR specialist Tramadol 50 mg tablet every 6 hours as needed ordered.    Past Medical History:  Diagnosis Date  . Colon adenocarcinoma ([place])   . Complication of anesthesia    severe confusion  . GERD  (gastroesophageal reflux disease)   . History of colon polyps   . History of gallstones   . Hx of transfusion of platelets   . Hypertension   . ITP (idiopathic thrombocytopenic purpura)    Dr. Grayland Ormond follows  . Kidney stones   . Lewy body dementia with behavioral disturbance   . Major depressive disorder, recurrent episode, in partial remission with anxious distress ([place])   . Pancreatitis 2/11  . Pollen allergies   . Skin cancer    ears  . Sleep apnea    "won't wear mask anymore" (12/22/2012)  . Unspecified essential hypertension    Past Surgical History:  Procedure Laterality Date  . CARDIAC CATHETERIZATION  2000's   "tx'd w/acid reflux RX" (12/22/2012)  . CHOLECYSTECTOMY  2011  . ESOPHAGEAL DILATION  1990's?  Marland Kitchen HERNIA REPAIR  12/22/2012   IHR w/mesh  . INGUINAL HERNIA REPAIR Left [id number]  . INSERTION OF MESH N/A 12/22/2012   Procedure: INSERTION OF MESH;  Surgeon: Harl Bowie, MD;  Location: [facility];  Service: General;  Laterality: N/A;  . LITHOTRIPSY  2008   x2  . PARTIAL COLECTOMY  03/21/2012   Procedure: PARTIAL COLECTOMY;  Surgeon: Harl Bowie, MD;  Location: [facility];  Service: General;  Laterality: Right;  . PILONIDAL CYST EXCISION  1960's?  Marland Kitchen SKIN CANCER EXCISION     "face; ?left ear" (12/22/2012)  . TONSILLECTOMY AND ADENOIDECTOMY  1946  . TOTAL HIP ARTHROPLASTY Left ~ 2002  . UPPER GASTROINTESTINAL ENDOSCOPY    . VENTRAL HERNIA REPAIR N/A 12/22/2012   Procedure:  HERNIA REPAIR INCISIONAL ;  Surgeon: Harl Bowie, MD;  Location: [facility];  Service: General;  Laterality: N/A;    Allergies  Allergen Reactions  . Aspirin   . Chicken Allergy Nausea And Vomiting    Unknown   . Other Nausea And Vomiting    [place] per daughter     Allergies as of 02/11/2017      Reactions   Aspirin    Chicken Allergy Nausea And Vomiting   Unknown   Other Nausea And Vomiting   [place] per daughter       Medication List       Accurate as of 02/11/17  4:37 PM.  Always use your most recent med list.          acetaminophen 325 MG tablet Commonly known as:  TYLENOL Take 650 mg by mouth 3 (three) times daily.   ALPRAZolam 0.5 MG tablet Commonly known as:  XANAX Take 1 tablet (0.5 mg total) by mouth at bedtime as needed for anxiety.   donepezil 10 MG tablet Commonly known as:  ARICEPT take 1 tablet by mouth once daily   FLUoxetine 20 MG tablet Commonly known as:  PROZAC Take 1 tablet (20 mg total) by mouth daily.   hydrOXYzine 10 MG tablet Commonly known as:  ATARAX/VISTARIL take 1 tablet by mouth at bedtime   potassium chloride SA 20 MEQ tablet Commonly known as:  K-DUR,KLOR-CON Take 2 tablets (40 mEq total) by mouth daily.   traMADol 50 MG tablet Commonly known as:  ULTRAM Take 50 mg by mouth 3 (three) times daily as needed (pain).       Review of Systems  Constitutional: Negative for activity change, appetite change, chills, fatigue and fever.  HENT: Negative for congestion, rhinorrhea, sinus pain, sinus pressure, sneezing and sore throat.   Eyes: Negative.   Respiratory: Negative for cough, chest tightness, shortness of breath and wheezing.   Cardiovascular: Negative for chest pain, palpitations and leg swelling.  Gastrointestinal: Negative for abdominal distention, abdominal pain, constipation, diarrhea, nausea and vomiting.  Genitourinary: Negative for dysuria, flank pain, frequency and urgency.  Musculoskeletal: Positive for gait problem.       Bilateral hip pain   Skin: Negative for color change, pallor, rash and wound.  Neurological: Negative for dizziness, seizures, syncope, light-headedness and headaches.  Hematological: Does not bruise/bleed easily.  Psychiatric/Behavioral: Positive for confusion. Negative for agitation, hallucinations and sleep disturbance. The patient is not nervous/anxious.     Immunization History  Administered Date(s) Administered  . Influenza Split 08/20/2011  . Influenza,inj,Quad PF,36+  Mos 06/21/2013, 05/03/2014, 08/01/2015, 11/09/2016  . PPD Test 12/09/2016, 02/02/2017  . Pneumococcal Conjugate-13 05/03/2014  . Pneumococcal Polysaccharide-23 11/11/2011, 02/21/2012   Pertinent  Health Maintenance Due  Topic Date Due  . COLONOSCOPY  02/11/2029 (Originally 03/08/2013)  . INFLUENZA VACCINE  04/14/2017  . PNA vac Low Risk Adult  Completed   Fall Risk  11/09/2016 10/03/2015 05/03/2014 05/03/2014  Falls in the past year? Yes Yes Yes Yes  Number falls in past yr: 2 or more 2 or more 1 -  Injury with Fall? No No No -  Risk Factor Category  High Fall Risk High Fall Risk - -  Risk for fall due to : - History of fall(s) Impaired balance/gait -    Vitals:   02/11/17 1514  BP: (!) 154/82  Pulse: 68  Resp: (!) 22  Temp: 97.2 F (36.2 C)  Weight: 199 lb (90.3 kg)  Height: 5\' 10"  (1.778  m)   Body mass index is 28.55 kg/m. Physical Exam  Constitutional: He appears well-developed and well-nourished. No distress.  HENT:  Head: Normocephalic.  Mouth/Throat: Oropharynx is clear and moist. No oropharyngeal exudate.  Eyes: Conjunctivae and EOM are normal. Pupils are equal, round, and reactive to light. Right eye exhibits no discharge. Left eye exhibits no discharge. No scleral icterus.  Neck: Normal range of motion. No JVD present. No thyromegaly present.  Cardiovascular: Normal rate, regular rhythm, normal heart sounds and intact distal pulses.  Exam reveals no gallop and no friction rub.   No murmur heard. Pulmonary/Chest: Effort normal and breath sounds normal. No respiratory distress. He has no wheezes. He has no rales.  Abdominal: Soft. Bowel sounds are normal. He exhibits no distension. There is no tenderness. There is no rebound and no guarding.  Musculoskeletal: He exhibits no edema, tenderness or deformity.  Moves x 4 extremities. Unsteady gait   Lymphadenopathy:    He has no cervical adenopathy.  Neurological: He is alert.  Intermittent confusion at times   Skin: Skin  is warm and dry. No rash noted. No erythema. No pallor.  Psychiatric: He has a normal mood and affect.  Forgetful at times     Labs reviewed:  Recent Labs  01/07/17 0058 02/01/17 0641 02/02/17 0905 02/09/17  NA 140 142 141 146  K 3.2* 3.4* 3.4* 3.4  CL 103 113* 111  --   CO2 28 25 24   --   GLUCOSE 101* 95 138*  --   BUN 31* 25* 22* 20  CREATININE 1.23 1.30* 1.02 0.7  CALCIUM 8.4* 7.8* 8.1*  --   MG 1.9  --   --   --   PHOS 3.3  --   --   --     Recent Labs  11/09/16 1302 11/14/16 1405 01/06/17 1852 02/09/17  AST 16 28 28  11*  ALT 13 14* 13* 9*  ALKPHOS 42 41 47 55  BILITOT 0.5 1.2 0.9  --   PROT 7.2 8.1 7.9  --   ALBUMIN 4.0 4.3 4.2  --     Recent Labs  05/06/16 1406 11/09/16 1302  01/06/17 1852 01/07/17 0058 02/01/17 0641 02/02/17 0905 02/09/17  WBC 2.7* 3.1*  < > 3.6* 3.4* 6.8 6.0 2.7  NEUTROABS 1.1* 1.5  --  1.4  --   --   --   --   HGB 12.7* 12.7*  < > 12.9* 12.2* 9.9* 10.3* 8.6*  HCT 36.1* 36.9*  < > 36.6* 34.6* 27.7* 30.0* 26*  MCV 102.0* 105.5 Repeated and verified X2.*  < > 104.4* 105.1* 104.5* 103.4*  --   PLT 103* 106.0*  < > 104* 101* 62* 70* 103*  < > = values in this interval not displayed. Lab Results  Component Value Date   TSH 1.691 01/07/2017   No results found for: HGBA1C Lab Results  Component Value Date   CHOL 140 09/30/2015   HDL 38.70 (L) 09/30/2015   LDLCALC 87 09/30/2015   TRIG 74.0 09/30/2015   CHOLHDL 4 09/30/2015    Assessment/Plan 1. Hypokalemia K+3.4 (02/09/2017).Potassium chloride 40 meq tablet one by mouth X 1 dose now. Recheck CMP 02/15/2017.   2. Moderate protein-calorie malnutrition  TP 5.4 (02/09/2017).Registered dietician consult for protein supplement evaluation. Monitor CMP.   3. Hypoalbuminemia ALB 2.75  (02/09/2017).Registered dietician consult for protein supplement evaluation.monitor CMP.   4. Hypocalcemia Poor oral intake may be contributory factor.Ca 7.8 (02/09/2017).start calcium -vit D 500-200  mg-units  tablet one by mouth daily with breakfast. Continue to monitor.   Family/ staff Communication: Reviewed plan of care with patient and facility Nurse supervisor  Labs/tests ordered: CMP 02/15/2017.  Sandrea Hughs, NP

## 2017-02-25 ENCOUNTER — Telehealth: Payer: Self-pay

## 2017-02-25 NOTE — Telephone Encounter (Signed)
agreed

## 2017-02-25 NOTE — Telephone Encounter (Signed)
Monique Singletary PT with [facility] left v/m requesting verbal orders for home care PT 1 x a week for 1 week, 2 x a week for 2 weeks and 1 x a week for 2 weeks for gait training and balance and home safety.

## 2017-02-25 NOTE — Telephone Encounter (Signed)
Verbal order given to [facility] for [facility] PT 1 x a week for 1 week, 2 x a week for 2 weeks and 1 x a week for 2 weeks for gait training and balance and home safety.

## 2017-03-05 ENCOUNTER — Ambulatory Visit (INDEPENDENT_AMBULATORY_CARE_PROVIDER_SITE_OTHER): Payer: Medicare Other | Admitting: Internal Medicine

## 2017-03-05 ENCOUNTER — Encounter: Payer: Self-pay | Admitting: Internal Medicine

## 2017-03-05 VITALS — BP 118/70 | HR 70 | Temp 98.0°F | Wt 205.0 lb

## 2017-03-05 DIAGNOSIS — F3341 Major depressive disorder, recurrent, in partial remission: Secondary | ICD-10-CM | POA: Diagnosis not present

## 2017-03-05 DIAGNOSIS — F418 Other specified anxiety disorders: Secondary | ICD-10-CM

## 2017-03-05 DIAGNOSIS — R609 Edema, unspecified: Secondary | ICD-10-CM | POA: Diagnosis not present

## 2017-03-05 DIAGNOSIS — M159 Polyosteoarthritis, unspecified: Secondary | ICD-10-CM | POA: Diagnosis not present

## 2017-03-05 NOTE — Assessment & Plan Note (Signed)
This is mild Would not restart diuretic given acceptable BP and orthostatic problems Will start compression socks

## 2017-03-05 NOTE — Assessment & Plan Note (Signed)
Mostly neck and shoulders Has tylenol Will need to get the tramadol when worse

## 2017-03-05 NOTE — Assessment & Plan Note (Signed)
He has had 3 major changes in living arrangement in very short time Currently doesn't have exacerbation of MDD---this is mostly adjustment related Would not change his meds--fluoxetine and night prn alprazolam

## 2017-03-05 NOTE — Progress Notes (Signed)
Subjective:    Patient ID: John Summers, male    DOB: Nov 08, 1932, 83 y.o.   MRN: [id number]  HPI Here with [facility] Had been at an AL in [place] Admitted with UTI, son describes orthostatic dizziness (syncope) Has had worsening dementia and functional status Went to [facility] for 20 days for rehab Then moved to [facility] about 2 weeks ago (AL but higher level --dementia unit) Reviewed note from staff  Recently widowed and then moved to [place] in [place], then rehab, now new facility  He describes his new place as "marginal" Not happy with the communal shower  Has increased edema lately Off one of his meds due to orthostatic changes---happening since then (dyazide) No SOB Some chest pain--substernal (but only if he presses on it) No nocturia generally  Increased occurrences of "anxiety" Will cry, unsure with new surroundings No daily sadness Appetite is okay--but not that excited about the food  Having neck and back pain On tylenol regularly--but not asking for the prn tramadol much  Current Outpatient Prescriptions on File Prior to Visit  Medication Sig Dispense Refill  . acetaminophen (TYLENOL) 325 MG tablet Take 650 mg by mouth 3 (three) times daily.    Marland Kitchen ALPRAZolam (XANAX) 0.5 MG tablet Take 1 tablet (0.5 mg total) by mouth at bedtime as needed for anxiety. 30 tablet 0  . donepezil (ARICEPT) 10 MG tablet take 1 tablet by mouth once daily 30 tablet 5  . FLUoxetine (PROZAC) 20 MG tablet Take 1 tablet (20 mg total) by mouth daily. 30 tablet 5  . hydrOXYzine (ATARAX/VISTARIL) 10 MG tablet take 1 tablet by mouth at bedtime 90 tablet 0  . traMADol (ULTRAM) 50 MG tablet Take 50 mg by mouth 3 (three) times daily as needed (pain).    . potassium chloride SA (K-DUR,KLOR-CON) 20 MEQ tablet Take 2 tablets (40 mEq total) by mouth daily. 30 tablet 3   Current Facility-Administered Medications on File Prior to Visit  Medication Dose Route Frequency Provider  Last Rate Last Dose  . calcium-vitamin D (OSCAL WITH D) 500-200 MG-UNIT per tablet 1 tablet  1 tablet Oral Q breakfast Ngetich, Dinah C, NP        Allergies  Allergen Reactions  . Aspirin   . Chicken Allergy Nausea And Vomiting    Unknown   . Other Nausea And Vomiting    [place] per daughter     Past Medical History:  Diagnosis Date  . Colon adenocarcinoma ([place])   . Complication of anesthesia    severe confusion  . GERD (gastroesophageal reflux disease)   . History of colon polyps   . History of gallstones   . Hx of transfusion of platelets   . Hypertension   . ITP (idiopathic thrombocytopenic purpura)    Dr. Grayland Ormond follows  . Kidney stones   . Lewy body dementia with behavioral disturbance   . Major depressive disorder, recurrent episode, in partial remission with anxious distress ([place])   . Pancreatitis 2/11  . Pollen allergies   . Skin cancer    ears  . Sleep apnea    "won't wear mask anymore" (12/22/2012)  . Unspecified essential hypertension     Past Surgical History:  Procedure Laterality Date  . CARDIAC CATHETERIZATION  2000's   "tx'd w/acid reflux RX" (12/22/2012)  . CHOLECYSTECTOMY  2011  . ESOPHAGEAL DILATION  1990's?  Marland Kitchen HERNIA REPAIR  12/22/2012   IHR w/mesh  . INGUINAL HERNIA REPAIR Left [id number]  . INSERTION  OF MESH N/A 12/22/2012   Procedure: INSERTION OF MESH;  Surgeon: Harl Bowie, MD;  Location: [facility];  Service: General;  Laterality: N/A;  . LITHOTRIPSY  2008   x2  . PARTIAL COLECTOMY  03/21/2012   Procedure: PARTIAL COLECTOMY;  Surgeon: Harl Bowie, MD;  Location: [facility];  Service: General;  Laterality: Right;  . PILONIDAL CYST EXCISION  1960's?  Marland Kitchen SKIN CANCER EXCISION     "face; ?left ear" (12/22/2012)  . TONSILLECTOMY AND ADENOIDECTOMY  1946  . TOTAL HIP ARTHROPLASTY Left ~ 2002  . UPPER GASTROINTESTINAL ENDOSCOPY    . VENTRAL HERNIA REPAIR N/A 12/22/2012   Procedure: HERNIA REPAIR INCISIONAL ;  Surgeon: Harl Bowie, MD;   Location: [facility];  Service: General;  Laterality: N/A;    Family History  Problem Relation Age of Onset  . Colon cancer Mother   . Heart disease Mother   . Hypertension Mother   . Alcohol abuse Father   . Esophageal cancer Neg Hx   . Rectal cancer Neg Hx   . Stomach cancer Neg Hx     Social History   Social History  . Marital status: Married    Spouse name: Charlett Nose  . Number of children: 2  . Years of education: college   Occupational History  .  Retired    Retired, Production manager, Investment banker, corporate   Social History Main Topics  . Smoking status: Former Smoker    Types: Pipe  . Smokeless tobacco: Never Used     Comment: i4/06/2013 "only smoked pipe n college 1958"  . Alcohol use No  . Drug use: No  . Sexual activity: No   Other Topics Concern  . Not on file   Social History Narrative   Patient is married Charlett Nose) and lives with his wife.   Patient has two children and his wife has two children..   Patient is retired.   Patient has a college education.   Patient is right-handed.   Patient drinks 1-2 cups of coffee daily.   Review of Systems Sleeps okay till 4-5AM. Does go to sleep at [place] are okay    Objective:   Physical Exam  Constitutional: He appears well-nourished. No distress.  Neck: No thyromegaly present.  Cardiovascular: Normal rate, regular rhythm and normal heart sounds.  Exam reveals no gallop.   No murmur heard. Pulmonary/Chest: Effort normal and breath sounds normal. No respiratory distress. He has no wheezes. He has no rales.  Musculoskeletal:  Trace to 1+ edema in ankles  Lymphadenopathy:    He has no cervical adenopathy.  Psychiatric:  Normal speech and fairly eloquent with his descriptions (just can't remember things)          Assessment & Plan:

## 2017-03-08 ENCOUNTER — Ambulatory Visit: Payer: Medicare Other | Admitting: Family Medicine

## 2017-03-09 ENCOUNTER — Other Ambulatory Visit: Payer: Medicare Other

## 2017-03-09 DIAGNOSIS — R350 Frequency of micturition: Secondary | ICD-10-CM

## 2017-03-09 DIAGNOSIS — R3989 Other symptoms and signs involving the genitourinary system: Secondary | ICD-10-CM

## 2017-03-10 LAB — URINALYSIS, MICROSCOPIC ONLY
CASTS: NONE SEEN [LPF]
CRYSTALS: NONE SEEN [HPF]
RBC / HPF: NONE SEEN RBC/HPF (ref <=2)
YEAST: NONE SEEN [HPF]

## 2017-03-10 LAB — URINALYSIS, ROUTINE W REFLEX MICROSCOPIC
Bilirubin Urine: NEGATIVE
GLUCOSE, UA: NEGATIVE
Ketones, ur: NEGATIVE
LEUKOCYTES UA: NEGATIVE
Nitrite: POSITIVE — AB
PH: 7.5 (ref 5.0–8.0)
Specific Gravity, Urine: 1.018 (ref 1.001–1.035)

## 2017-03-12 LAB — URINE CULTURE

## 2017-03-22 ENCOUNTER — Telehealth: Payer: Self-pay

## 2017-03-22 NOTE — Telephone Encounter (Signed)
Amy Moon PT with [facility] left v/m requesting verbal orders HH PT 2 x a week for 1 week; pt has been taking abx for UTI and that has effected pt's walking; pt will be discharged next week.

## 2017-03-22 NOTE — Telephone Encounter (Signed)
Verbal order given to [place] for [facility] PT 2 x a week for 1 week.

## 2017-03-26 ENCOUNTER — Telehealth: Payer: Self-pay

## 2017-03-26 NOTE — Telephone Encounter (Signed)
Amy Moon PT with [facility] left v/m that she would be discharging the pt from [facility] PT next week; pt improved slightly.No cb needed unless Dr Lorelei Pont wants to make further suggestions.

## 2017-04-01 ENCOUNTER — Ambulatory Visit (INDEPENDENT_AMBULATORY_CARE_PROVIDER_SITE_OTHER): Payer: Medicare Other | Admitting: Family Medicine

## 2017-04-01 ENCOUNTER — Encounter: Payer: Self-pay | Admitting: Family Medicine

## 2017-04-01 VITALS — BP 118/70 | HR 74 | Temp 98.6°F | Wt 201.0 lb

## 2017-04-01 DIAGNOSIS — F0281 Dementia in other diseases classified elsewhere with behavioral disturbance: Secondary | ICD-10-CM

## 2017-04-01 DIAGNOSIS — G3183 Dementia with Lewy bodies: Secondary | ICD-10-CM | POA: Diagnosis not present

## 2017-04-01 DIAGNOSIS — R55 Syncope and collapse: Secondary | ICD-10-CM

## 2017-04-01 NOTE — Progress Notes (Signed)
Dr. Frederico Hamman T. Truong Delcastillo, MD, [facility] [place], [address] Phone: [phone] Fax: [phone]  04/01/2017  Patient: John Summers, MRN: [id number], DOB: Jan 22, 1933, 83 y.o.  Primary Physician:  Owens Loffler, MD   Chief Complaint  Patient presents with  . Follow-up    1 month f/u per Dr. Silvio Pate   Subjective:   John Summers is a 83 y.o. very pleasant male patient who presents with the following:  Now at [facility]   Anxiety  Globally he is doing okay, but his Lewy body dementia is advancing. He is having more frequent hallucinations. He also is still grieving from the death of his wife. He continues to have intermittent anxiety also and will occasionally cry.  We did place him on Prozac earlier in the year, and this is seemed to help somewhat. On his MAR, the patient only has Xanax listed at one time daily.  He also is having ongoing back pain.  Past Medical History, Surgical History, Social History, Family History, Problem List, Medications, and Allergies have been reviewed and updated if relevant.  Patient Active Problem List   Diagnosis Date Noted  . Lewy body dementia with behavioral disturbance 11/16/2014    Priority: High  . Adenocarcinoma of cecum ([place]) 03/19/2012    Priority: High  . Sleep apnea     Priority: Medium  . Essential hypertension     Priority: Medium  . Edema 03/05/2017  . Generalized osteoarthritis 03/05/2017  . AF (paroxysmal atrial fibrillation) ([place]) 02/01/2017  . AKI (acute kidney injury) ([place]) 02/01/2017  . UTI (urinary tract infection) 01/31/2017  . Near syncope 01/06/2017  . Gait disturbance 12/30/2016  . MGUS (monoclonal gammopathy of unknown significance) 05/10/2016  . Pancreatic mass 04/24/2016  . Thrombocytopenia ([place]) 10/04/2015  . Uncontrolled REM sleep behavior disorder 11/16/2014  . Tremor of right hand 11/16/2014  . Shuffling gait 11/16/2014  .  Major depressive disorder, recurrent episode, in partial remission with anxious distress ([place])   . REM sleep behavior disorder 12/07/2013  . Incisional hernia, without obstruction or gangrene 11/14/2012  . GERD (gastroesophageal reflux disease) 03/19/2012    Past Medical History:  Diagnosis Date  . Colon adenocarcinoma ([place])   . Complication of anesthesia    severe confusion  . GERD (gastroesophageal reflux disease)   . History of colon polyps   . History of gallstones   . Hx of transfusion of platelets   . Hypertension   . ITP (idiopathic thrombocytopenic purpura)    Dr. Grayland Ormond follows  . Kidney stones   . Lewy body dementia with behavioral disturbance   . Major depressive disorder, recurrent episode, in partial remission with anxious distress ([place])   . Pancreatitis 2/11  . Pollen allergies   . Skin cancer    ears  . Sleep apnea    "won't wear mask anymore" (12/22/2012)  . Unspecified essential hypertension     Past Surgical History:  Procedure Laterality Date  . CARDIAC CATHETERIZATION  2000's   "tx'd w/acid reflux RX" (12/22/2012)  . CHOLECYSTECTOMY  2011  . ESOPHAGEAL DILATION  1990's?  Marland Kitchen HERNIA REPAIR  12/22/2012   IHR w/mesh  . INGUINAL HERNIA REPAIR Left [id number]  . INSERTION OF MESH N/A 12/22/2012   Procedure: INSERTION OF MESH;  Surgeon: Harl Bowie, MD;  Location: [facility];  Service: General;  Laterality: N/A;  . LITHOTRIPSY  2008   x2  . PARTIAL COLECTOMY  03/21/2012  Procedure: PARTIAL COLECTOMY;  Surgeon: Harl Bowie, MD;  Location: [place];  Service: General;  Laterality: Right;  . PILONIDAL CYST EXCISION  1960's?  Marland Kitchen SKIN CANCER EXCISION     "face; ?left ear" (12/22/2012)  . TONSILLECTOMY AND ADENOIDECTOMY  1946  . TOTAL HIP ARTHROPLASTY Left ~ 2002  . UPPER GASTROINTESTINAL ENDOSCOPY    . VENTRAL HERNIA REPAIR N/A 12/22/2012   Procedure: HERNIA REPAIR INCISIONAL ;  Surgeon: Harl Bowie, MD;  Location: [facility];  Service: General;   Laterality: N/A;    Social History   Social History  . Marital status: Widowed    Spouse name: Charlett Nose  . Number of children: 2  . Years of education: college   Occupational History  .  Retired    Retired, Production manager, Investment banker, corporate   Social History Main Topics  . Smoking status: Former Smoker    Types: Pipe  . Smokeless tobacco: Never Used     Comment: i4/06/2013 "only smoked pipe n college 1958"  . Alcohol use No  . Drug use: No  . Sexual activity: No   Other Topics Concern  . Not on file   Social History Narrative   Patient is married Charlett Nose) and lives with his wife.   Patient has two children and his wife has two children..   Patient is retired.   Patient has a college education.   Patient is right-handed.   Patient drinks 1-2 cups of coffee daily.    Family History  Problem Relation Age of Onset  . Colon cancer Mother   . Heart disease Mother   . Hypertension Mother   . Alcohol abuse Father   . Esophageal cancer Neg Hx   . Rectal cancer Neg Hx   . Stomach cancer Neg Hx     Allergies  Allergen Reactions  . Aspirin   . Chicken Allergy Nausea And Vomiting    Unknown   . Other Nausea And Vomiting    [place] per daughter     Medication list reviewed and updated in full in [place].   GEN: No acute illnesses, no fevers, chills. GI: No n/v/d, eating normally Pulm: No SOB Interactive and getting along well at home.  Otherwise, ROS is as per the HPI.  Objective:   BP 118/70   Pulse 74   Temp 98.6 F (37 C) (Oral)   Wt 201 lb (91.2 kg)   BMI 28.84 kg/m   GEN: WDWN, NAD, Non-toxic, A & O x 3 HEENT: Atraumatic, Normocephalic. Neck supple. No masses, No LAD. Ears and Nose: No external deformity. CV: RRR, No M/G/R. No JVD. No thrill. No extra heart sounds. PULM: CTA B, no wheezes, crackles, rhonchi. No retractions. No resp. distress. No accessory muscle use. Diffuse back pain from approximately T9-S1. Bilateral. EXTR: No  c/c/e NEURO shuffling gait PSYCH: Normally interactive. Conversant. Not depressed or anxious appearing.  Calm demeanor.   Laboratory and Imaging Data:  Assessment and Plan:   Lewy body dementia with behavioral disturbance  Near syncope  >25 minutes spent in face to face time with patient, >50% spent in counselling or coordination of care   Tylenol and tramadol for back pain.  His low body dementia is advancing. Increase Xanax to every 8 hours if needed. If he continues to have significant anxiety, then as scheduled more extended release medication such as Xanax XR would not be unreasonable.  We discussed potential fall risks with him and his daughter, and this  is the primary risk with sedating medications.  Follow-up: 6 mo  Medications Discontinued During This Encounter  Medication Reason  . potassium chloride SA (K-DUR,KLOR-CON) 20 MEQ tablet Completed Course   Signed,  Daizha Anand T. Keyatta Tolles, MD   Allergies as of 04/01/2017      Reactions   Aspirin    Chicken Allergy Nausea And Vomiting   Unknown   Other Nausea And Vomiting   [place] per daughter       Medication List       Accurate as of 04/01/17  1:53 PM. Always use your most recent med list.          acetaminophen 325 MG tablet Commonly known as:  TYLENOL Take 650 mg by mouth 3 (three) times daily.   ALPRAZolam 0.5 MG tablet Commonly known as:  XANAX Take 1 tablet (0.5 mg total) by mouth at bedtime as needed for anxiety.   donepezil 10 MG tablet Commonly known as:  ARICEPT take 1 tablet by mouth once daily   FLUoxetine 20 MG tablet Commonly known as:  PROZAC Take 1 tablet (20 mg total) by mouth daily.   hydrOXYzine 10 MG tablet Commonly known as:  ATARAX/VISTARIL take 1 tablet by mouth at bedtime   traMADol 50 MG tablet Commonly known as:  ULTRAM Take 50 mg by mouth 3 (three) times daily as needed (pain).

## 2017-04-12 ENCOUNTER — Encounter: Payer: Self-pay | Admitting: Family Medicine

## 2017-04-13 ENCOUNTER — Telehealth: Payer: Self-pay

## 2017-04-13 NOTE — Telephone Encounter (Signed)
John Summers pts daughter left v/m; John Summers concerned about increased confusion and depression; tried xanax one evening which made pt more droggy the next day. John Summers wanted to know if needs to repeat labs; pt had acute renal failure with dehydration couple of months ago which can cause confusion. John Summers wants to know if should ck U/a and stool specimen. John Summers request cb or can call tiffany at [facility] at [phone].

## 2017-04-13 NOTE — Telephone Encounter (Signed)
I want to increase his prozac dosing to 40 mg a day.   We will have to communicate this with his facility to do this.   Can you also respond to his step-daughter via mychart?

## 2017-04-13 NOTE — Telephone Encounter (Signed)
Please ask if any new urinary symptoms, fever or abdominal pain. If yes.John Summers to send UA and micro as well as uculture. If not urinary symptoms then UA is not indicated.  Confusion alone not reason to check urine.  Given change I mood and mental status.John Summers He needs to be seen.. Please add to PCP schedule tommorow.

## 2017-04-13 NOTE — Telephone Encounter (Signed)
Spoke to pt's daughter. Made him an appt with Dr Lorelei Pont tomorrow at [phone]

## 2017-04-13 NOTE — Telephone Encounter (Signed)
[place] with [facility] of [facility] left v/m requesting order for U/A(due to pt confusion and anxiety)and immodium for diarrhea faxed to [phone].

## 2017-04-14 ENCOUNTER — Other Ambulatory Visit: Payer: Self-pay | Admitting: Family Medicine

## 2017-04-14 ENCOUNTER — Ambulatory Visit: Payer: Medicare Other | Admitting: Family Medicine

## 2017-04-14 ENCOUNTER — Telehealth: Payer: Self-pay | Admitting: Family Medicine

## 2017-04-14 MED ORDER — FLUOXETINE HCL 40 MG PO CAPS: 40.0000 mg | ORAL_CAPSULE | Freq: Every day | ORAL | 1 refills | Status: DC

## 2017-04-14 NOTE — Telephone Encounter (Signed)
PT daughter, [name] called to see if appt is necessary today. She said he is confused and unless labs are needed she doesn't think it is a good idea to bring him in. She is concerned about dehydration. She is requesting a cb.

## 2017-04-14 NOTE — Telephone Encounter (Signed)
Order to increase Fluoxetine to 40 mg daily faxed to [facility] at [phone].

## 2017-04-14 NOTE — Telephone Encounter (Signed)
John Summers is scheduled to see you today at 3:15 pm

## 2017-04-14 NOTE — Telephone Encounter (Signed)
I spoke to [name].   UA and culture pending.   Can we place contact facility and increase prozac dosing to 40 mg a day? (increased from 20 mg)  His appt is cancelled today.

## 2017-04-15 ENCOUNTER — Other Ambulatory Visit (INDEPENDENT_AMBULATORY_CARE_PROVIDER_SITE_OTHER): Payer: Medicare Other | Admitting: Family Medicine

## 2017-04-15 DIAGNOSIS — R350 Frequency of micturition: Secondary | ICD-10-CM

## 2017-04-15 LAB — URINALYSIS, ROUTINE W REFLEX MICROSCOPIC
Bilirubin Urine: NEGATIVE
Hgb urine dipstick: NEGATIVE
Ketones, ur: NEGATIVE
LEUKOCYTES UA: NEGATIVE
NITRITE: NEGATIVE
PH: 6 (ref 5.0–8.0)
RBC / HPF: NONE SEEN (ref 0–?)
Specific Gravity, Urine: 1.02 (ref 1.000–1.030)
TOTAL PROTEIN, URINE-UPE24: NEGATIVE
Urine Glucose: NEGATIVE
Urobilinogen, UA: 0.2 (ref 0.0–1.0)

## 2017-04-16 ENCOUNTER — Telehealth: Payer: Self-pay | Admitting: *Deleted

## 2017-04-16 LAB — URINE CULTURE: Organism ID, Bacteria: NO GROWTH

## 2017-04-16 NOTE — Telephone Encounter (Signed)
Kristi notified as instructed by telephone.

## 2017-04-16 NOTE — Telephone Encounter (Signed)
-----   Message from Owens Loffler, MD sent at 04/16/2017 11:02 AM EDT ----- Can you let them know that his urine appears normal. Culture is pending, but likely he does not have a UTI.  Daughter in law [name] is his poa

## 2017-04-16 NOTE — Telephone Encounter (Signed)
Left message for John Summers to return my call.

## 2017-04-19 ENCOUNTER — Ambulatory Visit (INDEPENDENT_AMBULATORY_CARE_PROVIDER_SITE_OTHER): Payer: Medicare Other | Admitting: Neurology

## 2017-04-19 ENCOUNTER — Encounter: Payer: Self-pay | Admitting: Neurology

## 2017-04-19 VITALS — BP 140/83 | HR 68 | Ht 71.0 in | Wt 200.0 lb

## 2017-04-19 DIAGNOSIS — F0281 Dementia in other diseases classified elsewhere with behavioral disturbance: Secondary | ICD-10-CM | POA: Diagnosis not present

## 2017-04-19 DIAGNOSIS — G3183 Dementia with Lewy bodies: Secondary | ICD-10-CM

## 2017-04-19 DIAGNOSIS — F02818 Dementia in other diseases classified elsewhere, unspecified severity, with other behavioral disturbance: Secondary | ICD-10-CM

## 2017-04-19 MED ORDER — MELATONIN 5 MG PO TABS: 5.0000 mg | ORAL_TABLET | Freq: Every day | ORAL | 5 refills | Status: AC

## 2017-04-19 MED ORDER — FLUOXETINE HCL 20 MG PO TABS: 40.0000 mg | ORAL_TABLET | Freq: Every day | ORAL | 5 refills | Status: DC

## 2017-04-19 MED ORDER — QUETIAPINE FUMARATE 25 MG PO TABS: 12.5000 mg | ORAL_TABLET | Freq: Every day | ORAL | 5 refills | Status: DC

## 2017-04-19 NOTE — Progress Notes (Signed)
PATIENT: John Summers DOB: [birth year]  REASON FOR VISIT: follow up- lewy body dementia-  Dr. Edilia Bo at [facility] [place].  HISTORY FROM: patient and daughter  HISTORY OF PRESENT ILLNESS: Interval history from 04/19/2017. Mr. John Summers is 20,  seen today in the presence of his daughter, a Equities trader. He lost his wife in March 2018, moved to an assisted living facility after the death, had to go to a rehabilitation facility and when released could not stay in assisted living. So he has meanwhile lived at [facility] care unit.  His memory overall is stable, he again scored 17 out of 30 points on the Mini-Mental Status Examination. Of concern are his nocturnal hallucinations- for the most part visual hallucinations. He states to me today that he has met all his friends yesterday night again, he has the Parkinsonian appearance with a reduced facial expression extremely slow gait, he relies on a walker he occasionally drools on the right side only, and his pseudobulbar affect has progressed. He is easily tearful. He is anxious, he is angry at times, yelling at imaginary people. He is less verbal.  We need to address depression, sleep and hallucinations. Possibly use Seroquel- he could not tolerate CLONAZEPAM for REM BD.    Mr. John Summers is seen 03-04-2016  in the presence of his wife, he is in physical therapy for his parkinsonian appearance and symptoms. His movements have slowed. He still and perhaps even increasing he has visual hallucinations but no auditory component results. People have moved into his home after the election and have not left. They're not talking and there are silently residing.  His movements have slowed he is slightly hunched and he is more rigid. He has increasingly frequently fallen. It is difficult for him to rise from a seated position and all this has been addressed with physical therapy in the hope of stalling with disease progression. He has started  to fall backwards. His stance is not as steady and he seems to sway. His head this often dropped to his chest and he is looking at the floor while he walks. He cannot dress steadily, he is so extremely slow. His dysphonia is unchanged. Reduced facial expression, "dropped "open mouth. Appetite has remained strong, sleepiness is stronger but his wife is also reported that he seems to gurgle, and drool. He is also diaphoretic at night. His sleep cannot be evaluated in the lab, he has pseudobulbar affect, gets suddently tearful. PBA. He has no sign of PSP.     (MM) Mr. John Summers is an 83 year old male with a history of Lewy body dementia. He returns today for follow-up. The patient remains on Aricept 10 mg daily. He also takes Klonopin half a tablet at bedtime. The patient feels that his memory has gotten slightly worse. He requires assistance with ADLs such as dressing. He does not operate a motor vehicle. The patient does have trouble with his balance and is working with physical therapy currently. He does not use an assistive device when ambulating. The patient does have some anxiety. When he becomes frustrated his tremor worsens and he will need to take deep breaths. These episodes only last for several seconds.  The patient continues to have hallucinations. His hallucinations are not violent or fearful. He states that he will see people in his home. He states it looks as if they're having conversation but he cannot hear them. He continues to take Klonopin at bedtime. His wife states that he sleeps  very well at bedtime and patient agrees. Patient denies any changes with his appetite. He denies any new neurological symptoms. He returns today for an evaluation.  HISTORY 05/01/15: Mr. John Summers is an 83 year old male with a history of possible Lewy body dementia. He returns today for an evaluation. The patient is on Aricept 10 mg daily. He is tolerating this medication well. The patient also takes Klonopin 1/2  tablet at bedtime for REM sleep behavior disorder. At the last visit the patient was asked to start melatonin 5 mg in conjunction to the Klonopin. Wife reports that she could not remember the name of this medication and therefore they did not start it. The patient feels that his memory has gotten worse. He is able to complete all ADLs independently. He reports he does have trouble dressing but this is due to his balance. The patient has had 2 falls since the last visit. The patient does not operate a motor vehicle. His wife now completes all the finances. He does report visual hallucinations. His hallucinations primarily consist of people. He reports that the hallucinations are not violent or fearful. They normally take place during the day rarely at night. The wife feels that his sleep is slightly better. He has been more calmer when he sleeps. The wife feels that he may be depressed. She states that he typically he has no motivation to do most things. She has encouraged him to complete basic needs such as bathing. Patient states that he does not want to start any medication for this.  REVIEW OF SYSTEMS: Out of a complete 14 system review of symptoms, the patient complains only of the following symptoms, and all other reviewed systems are negative.  Visual hallucinations, body rigidity, stiffness, tendency to fall forward or backward, unsteadiness, trouble with saliva in the second half of the night sometimes drooling. Pseudobulbar affect, affect incontinence, symptoms confusing the names of his grandchildren.  ALLERGIES: Allergies  Allergen Reactions  . Aspirin   . Chicken Allergy Nausea And Vomiting    Unknown   . Other Nausea And Vomiting    [place] per daughter     HOME MEDICATIONS: Outpatient Medications Prior to Visit  Medication Sig Dispense Refill  . acetaminophen (TYLENOL) 325 MG tablet Take 650 mg by mouth 3 (three) times daily.    Marland Kitchen ALPRAZolam (XANAX) 0.5 MG tablet Take 1 tablet (0.5  mg total) by mouth at bedtime as needed for anxiety. 30 tablet 0  . donepezil (ARICEPT) 10 MG tablet take 1 tablet by mouth once daily 30 tablet 5  . hydrOXYzine (ATARAX/VISTARIL) 10 MG tablet take 1 tablet by mouth at bedtime 90 tablet 0  . traMADol (ULTRAM) 50 MG tablet Take 50 mg by mouth 3 (three) times daily as needed (pain).    Marland Kitchen FLUoxetine (PROZAC) 40 MG capsule Take 1 capsule (40 mg total) by mouth daily. 90 capsule 1   Facility-Administered Medications Prior to Visit  Medication Dose Route Frequency Provider Last Rate Last Dose  . calcium-vitamin D (OSCAL WITH D) 500-200 MG-UNIT per tablet 1 tablet  1 tablet Oral Q breakfast Ngetich, Dinah C, NP        PAST MEDICAL HISTORY: Past Medical History:  Diagnosis Date  . Colon adenocarcinoma ([place])   . Complication of anesthesia    severe confusion  . GERD (gastroesophageal reflux disease)   . History of colon polyps   . History of gallstones   . Hx of transfusion of platelets   . Hypertension   .  ITP (idiopathic thrombocytopenic purpura)    Dr. Grayland Ormond follows  . Kidney stones   . Lewy body dementia with behavioral disturbance   . Major depressive disorder, recurrent episode, in partial remission with anxious distress ([place])   . Pancreatitis 2/11  . Pollen allergies   . Skin cancer    ears  . Sleep apnea    "won't wear mask anymore" (12/22/2012)  . Unspecified essential hypertension     PAST SURGICAL HISTORY: Past Surgical History:  Procedure Laterality Date  . CARDIAC CATHETERIZATION  2000's   "tx'd w/acid reflux RX" (12/22/2012)  . CHOLECYSTECTOMY  2011  . ESOPHAGEAL DILATION  1990's?  Marland Kitchen HERNIA REPAIR  12/22/2012   IHR w/mesh  . INGUINAL HERNIA REPAIR Left [id number]  . INSERTION OF MESH N/A 12/22/2012   Procedure: INSERTION OF MESH;  Surgeon: Harl Bowie, MD;  Location: [facility];  Service: General;  Laterality: N/A;  . LITHOTRIPSY  2008   x2  . PARTIAL COLECTOMY  03/21/2012   Procedure: PARTIAL COLECTOMY;   Surgeon: Harl Bowie, MD;  Location: [facility];  Service: General;  Laterality: Right;  . PILONIDAL CYST EXCISION  1960's?  Marland Kitchen SKIN CANCER EXCISION     "face; ?left ear" (12/22/2012)  . TONSILLECTOMY AND ADENOIDECTOMY  1946  . TOTAL HIP ARTHROPLASTY Left ~ 2002  . UPPER GASTROINTESTINAL ENDOSCOPY    . VENTRAL HERNIA REPAIR N/A 12/22/2012   Procedure: HERNIA REPAIR INCISIONAL ;  Surgeon: Harl Bowie, MD;  Location: [facility];  Service: General;  Laterality: N/A;    FAMILY HISTORY: Family History  Problem Relation Age of Onset  . Colon cancer Mother   . Heart disease Mother   . Hypertension Mother   . Alcohol abuse Father   . Esophageal cancer Neg Hx   . Rectal cancer Neg Hx   . Stomach cancer Neg Hx     SOCIAL HISTORY: Social History   Social History  . Marital status: Widowed    Spouse name: John Summers  . Number of children: 2  . Years of education: college   Occupational History  .  Retired    Retired, Production manager, Investment banker, corporate   Social History Main Topics  . Smoking status: Former Smoker    Types: Pipe  . Smokeless tobacco: Never Used     Comment: i4/06/2013 "only smoked pipe n college 1958"  . Alcohol use No  . Drug use: No  . Sexual activity: No   Other Topics Concern  . Not on file   Social History Narrative   Patient is widowed John Summers) and lives at [place]   Patient has two children and his wife has two children..   Patient is retired, Office manager   Patient has a Financial risk analyst.   Patient is right-handed.   Patient drinks 1-2 cups of coffee daily.      PHYSICAL EXAM  Vitals:   04/19/17 1021  BP: 140/83  Pulse: 68  Weight: 200 lb (90.7 kg)  Height: _0  (1.803 m)   Body mass index is 27.89 kg/m.   MMSE - Mini Mental State Exam 04/19/2017 06/04/2016 03/04/2016  Orientation to time _1 Orientation to Place _2 Registration _3 Attention/ Calculation 0 0 1  Recall 0 0 1  Language- name 2 objects _4 Language-  repeat 1 1 0  Language- follow 3 step command _5 Language- read & follow direction 1 1  1  Write a sentence _0 Copy design 0 1 0  Total score _1 Generalized: Well developed, in no acute distress   Neurological examination  Mentation: Alert.. Follows all commands speech and language fluent Cranial nerve  Pupils were equal round reactive to light. Extraocular movements were full, there is nystagmus with gaze to the left. Horizontal and vertical eye movements are otherwise intact, there is no sign of progressive supranuclear palsy.  Facial sensation  Normal. He tends to have his mouth prop open and he drools at night, Uvula is in midline as is tongue midline. Head turning and shoulder shrug  were normal and symmetric. His chin drops to his chest. His posture is hunched. There is more rigidity over his motor system throughout. He also presents with a pseudobulbar affect - easily is tearful. Motor: The motor testing reveals increased tone bilaterally-   Intermittent resting tremor in both hands Sensory:soft touch inatct Coordination: Cerebellar testing reveals good finger-Summers-finger and heel-to-shin bilaterally.  Gait and station:  Patient has a slight shuffling gait. He does require assistance with standing. He cannot stand up from a seated position without bracing and assistance. He now walks with a walker- a progression from 5 month ago.  Tandem gait not attempted. Decreased armswing bilaterally. Reflexes: Deep tendon reflexes are brisk, symmetric bilaterally.   DIAGNOSTIC DATA (LABS, IMAGING, TESTING) - I reviewed patient records, labs, notes, testing and imaging myself where available.  Lab Results  Component Value Date   WBC 2.7 02/09/2017   HGB 8.6 (A) 02/09/2017   HCT 26 (A) 02/09/2017   MCV 103.4 (H) 02/02/2017   PLT 103 (A) 02/09/2017    ASSESSMENT AND PLAN 83 y.o. year old male  has a past medical history of Colon adenocarcinoma ([place]); Complication of  anesthesia; GERD (gastroesophageal reflux disease); History of colon polyps; History of gallstones; transfusion of platelets; Hypertension; ITP (idiopathic thrombocytopenic purpura); Kidney stones; Lewy body dementia with behavioral disturbance; Major depressive disorder, recurrent episode, in partial remission with anxious distress ([place]); Pancreatitis (2/11); Pollen allergies; Skin cancer; Sleep apnea; and Unspecified essential hypertension. here with:  1. Dementia with Lewy bodies, hallucinations beginning in the late afternoon.   Not controlled. No auditory component, day to day variablility - visual only. Makes him mad now- worse at night . Formed hallucinations.   However , he has verbalized aggression. He is afraid he had hit a member of the community.   I ordered Seroquel at 12.5 at night plus melatonin.   [name] memory by  MMSE today is  MMSE - Mini Mental State Exam 04/19/2017 06/04/2016 03/04/2016  Orientation to time _2 Orientation to Place _3 Registration _4 Attention/ Calculation 0 0 1  Recall 0 0 1  Language- name 2 objects _5 Language- repeat 1 1 0  Language- follow 3 step command _6 Language- read & follow direction _7 Write a sentence _8 Copy design 0 1 0  Total score _9 He will continue on Aricept 10 mg daily. D/C klonopin.  He reported people silently living in his house- now he lives in a memory care and confused people are invading his space.  He will follow-up in 4-6 months with NP   Larey Seat, MD   04/19/2017, 10:47 AM [facility] [address], [place],  [place] [address]   [phone]   CC Hematologist Dr Dartha Lodge, [facility]

## 2017-04-19 NOTE — Patient Instructions (Signed)
Quetiapine tablets What is this medicine? QUETIAPINE (kwe TYE a peen) is an antipsychotic. It is used to treat schizophrenia and bipolar disorder, also known as manic-depression. This medicine may be used for other purposes; ask your health care provider or pharmacist if you have questions. COMMON BRAND NAME(S): Seroquel What should I tell my health care provider before I take this medicine? They need to know if you have any of these conditions: -brain tumor or head injury -breast cancer -cataracts -diabetes -difficulty swallowing -heart disease -kidney disease -liver disease -low blood counts, like low white cell, platelet, or red cell counts -low blood pressure or dizziness when standing up -Parkinson's disease -previous heart attack -seizures -suicidal thoughts, plans, or attempt by you or a family member -thyroid disease -an unusual or allergic reaction to quetiapine, other medicines, foods, dyes, or preservatives -pregnant or trying to get pregnant -breast-feeding How should I use this medicine? Take this medicine by mouth. Swallow it with a drink of water. Follow the directions on the prescription label. If it upsets your stomach you can take it with food. Take your medicine at regular intervals. Do not take it more often than directed. Do not stop taking except on the advice of your doctor or health care professional. A special MedGuide will be given to you by the pharmacist with each prescription and refill. Be sure to read this information carefully each time. Talk to your pediatrician regarding the use of this medicine in children. While this drug may be prescribed for children as young as 10 years for selected conditions, precautions do apply. Patients over age 65 years may have a stronger reaction to this medicine and need smaller doses. Overdosage: If you think you have taken too much of this medicine contact a poison control center or emergency room at once. NOTE: This  medicine is only for you. Do not share this medicine with others. What if I miss a dose? If you miss a dose, take it as soon as you can. If it is almost time for your next dose, take only that dose. Do not take double or extra doses. What may interact with this medicine? Do not take this medicine with any of the following medications: -certain medicines for fungal infections like fluconazole, itraconazole, ketoconazole, posaconazole, voriconazole -cisapride -dofetilide -dronedarone -droperidol -grepafloxacin -halofantrine -phenothiazines like chlorpromazine, mesoridazine, thioridazine -pimozide -sparfloxacin -ziprasidone This medicine may also interact with the following medications: -alcohol -antiviral medicines for HIV or AIDS -certain medicines for blood pressure -certain medicines for depression, anxiety, or psychotic disturbances like haloperidol, lorazepam -certain medicines for diabetes -certain medicines for Parkinson's disease -certain medicines for seizures like carbamazepine, phenobarbital, phenytoin -cimetidine -erythromycin -other medicines that prolong the QT interval (cause an abnormal heart rhythm) -rifampin -steroid medicines like prednisone or cortisone This list may not describe all possible interactions. Give your health care provider a list of all the medicines, herbs, non-prescription drugs, or dietary supplements you use. Also tell them if you smoke, drink alcohol, or use illegal drugs. Some items may interact with your medicine. What should I watch for while using this medicine? Visit your doctor or health care professional for regular checks on your progress. It may be several weeks before you see the full effects of this medicine. Your health care provider may suggest that you have your eyes examined prior to starting this medicine, and every 6 months thereafter. If you have been taking this medicine regularly for some time, do not suddenly stop taking it.  You must gradually   reduce the dose or your symptoms may get worse. Ask your doctor or health care professional for advice. Patients and their families should watch out for worsening depression or thoughts of suicide. Also watch out for sudden or severe changes in feelings such as feeling anxious, agitated, panicky, irritable, hostile, aggressive, impulsive, severely restless, overly excited and hyperactive, or not being able to sleep. If this happens, especially at the beginning of antidepressant treatment or after a change in dose, call your health care professional. You may get dizzy or drowsy. Do not drive, use machinery, or do anything that needs mental alertness until you know how this medicine affects you. Do not stand or sit up quickly, especially if you are an older patient. This reduces the risk of dizzy or fainting spells. Alcohol can increase dizziness and drowsiness. Avoid alcoholic drinks. Do not treat yourself for colds, diarrhea or allergies. Ask your doctor or health care professional for advice, some ingredients may increase possible side effects. This medicine can reduce the response of your body to heat or cold. Dress warm in cold weather and stay hydrated in hot weather. If possible, avoid extreme temperatures like saunas, hot tubs, very hot or cold showers, or activities that can cause dehydration such as vigorous exercise. What side effects may I notice from receiving this medicine? Side effects that you should report to your doctor or health care professional as soon as possible: -allergic reactions like skin rash, itching or hives, swelling of the face, lips, or tongue -difficulty swallowing -fast or irregular heartbeat -fever or chills, sore throat -fever with rash, swollen lymph nodes, or swelling of the face -increased hunger or thirst -increased urination -problems with balance, talking, walking -seizures -stiff muscles -suicidal thoughts or other mood  changes -uncontrollable head, mouth, neck, arm, or leg movements -unusually weak or tired Side effects that usually do not require medical attention (report to your doctor or health care professional if they continue or are bothersome): -change in sex drive or performance -constipation -drowsy or dizzy -dry mouth -stomach upset -weight gain This list may not describe all possible side effects. Call your doctor for medical advice about side effects. You may report side effects to FDA at 1-800-FDA-1088. Where should I keep my medicine? Keep out of the reach of children. Store at room temperature between 15 and 30 degrees C (59 and 86 degrees F). Throw away any unused medicine after the expiration date. NOTE: This sheet is a summary. It may not cover all possible information. If you have questions about this medicine, talk to your doctor, pharmacist, or health care provider.  2018 Elsevier/Gold Standard (2015-03-05 13:07:35)  

## 2017-04-19 NOTE — Addendum Note (Signed)
Addended by: Larey Seat on: 04/19/2017 11:15 AM   Modules accepted: Orders

## 2017-05-18 ENCOUNTER — Telehealth: Payer: Self-pay | Admitting: Neurology

## 2017-05-18 ENCOUNTER — Other Ambulatory Visit: Payer: Self-pay | Admitting: Neurology

## 2017-05-18 MED ORDER — QUETIAPINE FUMARATE 25 MG PO TABS: 25.0000 mg | ORAL_TABLET | Freq: Every day | ORAL | 0 refills | Status: DC

## 2017-05-18 NOTE — Telephone Encounter (Signed)
Left a message also with the [facility] nurse on how to proceed with sending new script. No answer will await return call

## 2017-05-18 NOTE — Telephone Encounter (Signed)
Per Dr Brett Fairy we will increase the medication to Seroquel 25mg  whole tab at bedtime.

## 2017-05-18 NOTE — Telephone Encounter (Signed)
Called to make the daughter aware of this change. No answer at this time.

## 2017-05-18 NOTE — Telephone Encounter (Signed)
Pt daughter calling back to inform that the hallucinations have worsen/increased since pt has been on QUEtiapine (SEROQUEL) 25 MG tablet.  It was only in the evenings but now more frequently seeing animals.  Daughter states if changes are going to be made to medication call [facility](which is where pt is) [phone].  Daughter states it is ok to call her also

## 2017-05-20 NOTE — Telephone Encounter (Signed)
Called the pt's daughter and had no answer. Called the [facility] facility where the patient resides and left a messaged for the nurse Ms Karlton Lemon to call back to get the updated medication order. This is second message left with the facility.

## 2017-05-24 ENCOUNTER — Telehealth: Payer: Self-pay | Admitting: Family Medicine

## 2017-05-24 DIAGNOSIS — Z7689 Persons encountering health services in other specified circumstances: Secondary | ICD-10-CM

## 2017-05-24 NOTE — Telephone Encounter (Signed)
Pt's daughter dropped off FL2 to be filled out. Placed in [place] tower. She states he is moving tomorrow and they just found out Friday. Requesting a call if ppw won't be ready today so she can call home to let them know.

## 2017-05-24 NOTE — Telephone Encounter (Signed)
Tiffany/[facility] returned RN's call. She can be reached at [phone]

## 2017-05-24 NOTE — Telephone Encounter (Signed)
Forms placed in Dr. Copland's in box to complete.  ?

## 2017-05-24 NOTE — Telephone Encounter (Signed)
Spoke with Tiffany from [facility]. She stated that I would just need to send the prescription to their location and then they will process it to get it filled. Fax number was given and prescription was fax'ed.

## 2017-05-24 NOTE — Telephone Encounter (Signed)
Please help if any way possible?

## 2017-05-24 NOTE — Telephone Encounter (Signed)
I have completed everything that I can.

## 2017-06-28 ENCOUNTER — Emergency Department
Admission: EM | Admit: 2017-06-28 | Discharge: 2017-06-28 | Disposition: A | Discharge status: Home / Self Care | Payer: Medicare Other | Attending: Emergency Medicine | Admitting: Emergency Medicine

## 2017-06-28 ENCOUNTER — Encounter: Payer: Self-pay | Admitting: *Deleted

## 2017-06-28 ENCOUNTER — Emergency Department: Payer: Medicare Other

## 2017-06-28 DIAGNOSIS — W01198A Fall on same level from slipping, tripping and stumbling with subsequent striking against other object, initial encounter: Secondary | ICD-10-CM | POA: Insufficient documentation

## 2017-06-28 DIAGNOSIS — W19XXXA Unspecified fall, initial encounter: Secondary | ICD-10-CM

## 2017-06-28 DIAGNOSIS — S0990XA Unspecified injury of head, initial encounter: Secondary | ICD-10-CM | POA: Diagnosis present

## 2017-06-28 DIAGNOSIS — Y999 Unspecified external cause status: Secondary | ICD-10-CM | POA: Insufficient documentation

## 2017-06-28 DIAGNOSIS — Y939 Activity, unspecified: Secondary | ICD-10-CM | POA: Insufficient documentation

## 2017-06-28 DIAGNOSIS — Z85820 Personal history of malignant melanoma of skin: Secondary | ICD-10-CM | POA: Diagnosis not present

## 2017-06-28 DIAGNOSIS — F0390 Unspecified dementia without behavioral disturbance: Secondary | ICD-10-CM | POA: Insufficient documentation

## 2017-06-28 DIAGNOSIS — Z87891 Personal history of nicotine dependence: Secondary | ICD-10-CM | POA: Insufficient documentation

## 2017-06-28 DIAGNOSIS — I10 Essential (primary) hypertension: Secondary | ICD-10-CM | POA: Diagnosis not present

## 2017-06-28 DIAGNOSIS — Y929 Unspecified place or not applicable: Secondary | ICD-10-CM | POA: Diagnosis not present

## 2017-06-28 DIAGNOSIS — R531 Weakness: Secondary | ICD-10-CM | POA: Insufficient documentation

## 2017-06-28 LAB — CBC WITH DIFFERENTIAL/PLATELET
Basophils Absolute: 0 10*3/uL (ref 0–0.1)
Basophils Relative: 0 %
Eosinophils Absolute: 0 10*3/uL (ref 0–0.7)
Eosinophils Relative: 1 %
HEMATOCRIT: 30.9 % — AB (ref 40.0–52.0)
HEMOGLOBIN: 10.8 g/dL — AB (ref 13.0–18.0)
LYMPHS ABS: 0.7 10*3/uL — AB (ref 1.0–3.6)
LYMPHS PCT: 37 %
MCH: 36.4 pg — AB (ref 26.0–34.0)
MCHC: 35 g/dL (ref 32.0–36.0)
MCV: 104.1 fL — AB (ref 80.0–100.0)
Monocytes Absolute: 0.4 10*3/uL (ref 0.2–1.0)
Monocytes Relative: 22 %
NEUTROS ABS: 0.8 10*3/uL — AB (ref 1.4–6.5)
NEUTROS PCT: 40 %
Platelets: 95 10*3/uL — ABNORMAL LOW (ref 150–440)
RBC: 2.97 MIL/uL — AB (ref 4.40–5.90)
RDW: 13.5 % (ref 11.5–14.5)
WBC: 1.9 10*3/uL — AB (ref 3.8–10.6)

## 2017-06-28 LAB — COMPREHENSIVE METABOLIC PANEL
ALK PHOS: 50 U/L (ref 38–126)
ALT: 10 U/L — AB (ref 17–63)
AST: 16 U/L (ref 15–41)
Albumin: 3.9 g/dL (ref 3.5–5.0)
Anion gap: 8 (ref 5–15)
BILIRUBIN TOTAL: 0.5 mg/dL (ref 0.3–1.2)
BUN: 20 mg/dL (ref 6–20)
CALCIUM: 9 mg/dL (ref 8.9–10.3)
CO2: 26 mmol/L (ref 22–32)
CREATININE: 1.21 mg/dL (ref 0.61–1.24)
Chloride: 106 mmol/L (ref 101–111)
GFR, EST NON AFRICAN AMERICAN: 54 mL/min — AB (ref >60)
Glucose, Bld: 99 mg/dL (ref 65–99)
Potassium: 4.2 mmol/L (ref 3.5–5.1)
SODIUM: 140 mmol/L (ref 135–145)
TOTAL PROTEIN: 7.4 g/dL (ref 6.5–8.1)

## 2017-06-28 LAB — URINALYSIS, COMPLETE (UACMP) WITH MICROSCOPIC
Bacteria, UA: NONE SEEN
Bilirubin Urine: NEGATIVE
Glucose, UA: NEGATIVE mg/dL
Hgb urine dipstick: NEGATIVE
KETONES UR: NEGATIVE mg/dL
Leukocytes, UA: NEGATIVE
Nitrite: NEGATIVE
PH: 6 (ref 5.0–8.0)
PROTEIN: NEGATIVE mg/dL
Specific Gravity, Urine: 1.016 (ref 1.005–1.030)
Squamous Epithelial / LPF: NONE SEEN

## 2017-06-28 LAB — TROPONIN I: Troponin I: <0.03 ng/mL (ref <0.03)

## 2017-06-28 NOTE — ED Provider Notes (Signed)
[facility] Emergency Department Provider Note       Time seen: ----------------------------------------- 3:12 PM on 06/28/2017 -----------------------------------------  Level V caveat: History/ROS limited by dementia   I have reviewed the triage vital signs and the nursing notes.   HISTORY   Chief Complaint Fall    HPI John Summers is a 83 y.o. male with a history of dementia who presents to the ED for after a fall at [facility]. Patient states he tried to sit down and missed the chair, hitting his head on the door jam. He is complaining of some hip pain but has a history of low body dementia. No loss of conscious was reported. He has had multiple recent falls. Family thinks he may be more weak than normal.  Past Medical History:  Diagnosis Date  . Colon adenocarcinoma ([place])   . Complication of anesthesia    severe confusion  . GERD (gastroesophageal reflux disease)   . History of colon polyps   . History of gallstones   . Hx of transfusion of platelets   . Hypertension   . ITP (idiopathic thrombocytopenic purpura)    Dr. Grayland Ormond follows  . Kidney stones   . Lewy body dementia with behavioral disturbance   . Major depressive disorder, recurrent episode, in partial remission with anxious distress ([place])   . Pancreatitis 2/11  . Pollen allergies   . Skin cancer    ears  . Sleep apnea    "won't wear mask anymore" (12/22/2012)  . Unspecified essential hypertension     Patient Active Problem List   Diagnosis Date Noted  . Generalized osteoarthritis 03/05/2017  . AF (paroxysmal atrial fibrillation) ([place]) 02/01/2017  . Near syncope 01/06/2017  . Gait disturbance 12/30/2016  . MGUS (monoclonal gammopathy of unknown significance) 05/10/2016  . Pancreatic mass 04/24/2016  . Thrombocytopenia ([place]) 10/04/2015  . Uncontrolled REM sleep behavior disorder 11/16/2014  . Lewy body dementia with behavioral disturbance 11/16/2014  . Tremor of  right hand 11/16/2014  . Major depressive disorder, recurrent episode, in partial remission with anxious distress ([place])   . REM sleep behavior disorder 12/07/2013  . Incisional hernia, without obstruction or gangrene 11/14/2012  . Adenocarcinoma of cecum ([place]) 03/19/2012  . GERD (gastroesophageal reflux disease) 03/19/2012  . Sleep apnea   . Essential hypertension     Past Surgical History:  Procedure Laterality Date  . CARDIAC CATHETERIZATION  2000's   "tx'd w/acid reflux RX" (12/22/2012)  . CHOLECYSTECTOMY  2011  . ESOPHAGEAL DILATION  1990's?  Marland Kitchen HERNIA REPAIR  12/22/2012   IHR w/mesh  . INGUINAL HERNIA REPAIR Left [id number]  . INSERTION OF MESH N/A 12/22/2012   Procedure: INSERTION OF MESH;  Surgeon: Harl Bowie, MD;  Location: [place];  Service: General;  Laterality: N/A;  . LITHOTRIPSY  2008   x2  . PARTIAL COLECTOMY  03/21/2012   Procedure: PARTIAL COLECTOMY;  Surgeon: Harl Bowie, MD;  Location: [facility];  Service: General;  Laterality: Right;  . PILONIDAL CYST EXCISION  1960's?  Marland Kitchen SKIN CANCER EXCISION     "face; ?left ear" (12/22/2012)  . TONSILLECTOMY AND ADENOIDECTOMY  1946  . TOTAL HIP ARTHROPLASTY Left ~ 2002  . UPPER GASTROINTESTINAL ENDOSCOPY    . VENTRAL HERNIA REPAIR N/A 12/22/2012   Procedure: HERNIA REPAIR INCISIONAL ;  Surgeon: Harl Bowie, MD;  Location: [place];  Service: General;  Laterality: N/A;    Allergies Aspirin; Chicken allergy; and Other  Social History Social History  Substance Use Topics  . Smoking status: Former Smoker    Types: Pipe  . Smokeless tobacco: Never Used     Comment: i4/06/2013 "only smoked pipe n college 1958"  . Alcohol use No    Review of Systems unknown possible head injury, hip pain, recent falls and weakness  All systems negative/normal/unremarkable except as stated in the HPI  ____________________________________________   PHYSICAL EXAM:  VITAL SIGNS: ED Triage Vitals [06/28/17 1505]  Enc Vitals  Group     BP (!) 166/90     Pulse Rate 73     Resp 18     Temp 97.7 F (36.5 C)     Temp Source Oral     SpO2 97 %     Weight 200 lb (90.7 kg)     Height 6' (1.829 m)     Head Circumference      Peak Flow      Pain Score      Pain Loc      Pain Edu?      Excl. in [place]?     Constitutional: Alert but disoriented. Well appearing and in no distress. Eyes: Conjunctivae are normal. Normal extraocular movements. ENT   Head: Normocephalic and atraumatic.no signs of head injury are noted   Nose: No congestion/rhinnorhea.   Mouth/Throat: Mucous membranes are moist.   Neck: No stridor. Cardiovascular: Normal rate, regular rhythm. No murmurs, rubs, or gallops. Respiratory: Normal respiratory effort without tachypnea nor retractions. Breath sounds are clear and equal bilaterally. No wheezes/rales/rhonchi. Gastrointestinal: Soft and nontender. Normal bowel sounds Musculoskeletal: limited but unremarkable range of motion of the extremities. Nonfocal he tender Neurologic:  Normal speech and language. No gross focal neurologic deficits are appreciated.  Skin:  Skin is warm, dry and intact. No rash noted. Psychiatric: Mood and affect are normal. Speech and behavior are normal.  ____________________________________________  ED COURSE:  Pertinent labs & imaging results that were available during my care of the patient were reviewed by me and considered in my medical decision making (see chart for details). Patient presents for mechanical fall, we will assess with labs and imaging as indicated. Clinical Course as of Jun 28 1720  Mon Jun 28, 2017  1628 CBC reveals mild pancytopenia, not significantly changed from prior  [JW]    Clinical Course User Index [JW] Earleen Newport, MD   Procedures ____________________________________________   LABS (pertinent positives/negatives)  Labs Reviewed  CBC WITH DIFFERENTIAL/PLATELET - Abnormal; Notable for the following:       Result  Value   WBC 1.9 (*)    RBC 2.97 (*)    Hemoglobin 10.8 (*)    HCT 30.9 (*)    MCV 104.1 (*)    MCH 36.4 (*)    Platelets 95 (*)    Neutro Abs 0.8 (*)    Lymphs Abs 0.7 (*)    All other components within normal limits  COMPREHENSIVE METABOLIC PANEL - Abnormal; Notable for the following:    ALT 10 (*)    GFR calc non Af Amer 54 (*)    All other components within normal limits  URINALYSIS, COMPLETE (UACMP) WITH MICROSCOPIC - Abnormal; Notable for the following:    Color, Urine YELLOW (*)    APPearance CLEAR (*)    All other components within normal limits  TROPONIN I    RADIOLOGY  CT head IMPRESSION: Mild diffuse cortical atrophy. Mild chronic ischemic white matter disease. No acute intracranial abnormality seen. ____________________________________________  DIFFERENTIAL DIAGNOSIS  dementia, UTI, electrolyte abnormality, dehydration, MI, subdural, epidural, skull fracture   FINAL ASSESSMENT AND PLAN  fall, head injury, weakness, dementia   Plan: Patient had presented for a fall. Patients labs were only remarkable for mild pancytopenia. He has had pancytopenia in the past but his white count seems to be slightly lower than previously. there is no sign of systemic infection at this point. Patients imaging was also reassuring, he is stable for outpatient follow-up with his doctor. Also to note, I did discuss his high blood pressure with his family. Rather than treating at this point he will need recheck of his blood pressure as an outpatient.   Earleen Newport, MD   Note: This note was generated in part or whole with voice recognition software. Voice recognition is usually quite accurate but there are transcription errors that can and very often do occur. I apologize for any typographical errors that were not detected and corrected.     Earleen Newport, MD 06/28/17 [phone]

## 2017-06-28 NOTE — ED Triage Notes (Signed)
Pt arrives via EMS from [facility], states he went to sit down and missed the chair, staff states he hit his head on the door jam and is complaining of left hip pain, hx of dementia, no abrasions noted, no LOC reported

## 2017-06-28 NOTE — ED Notes (Signed)
Family at bedside, pt given warm blanket and TV turned on

## 2017-06-28 NOTE — ED Notes (Signed)
Patient transported to CT 

## 2017-06-28 NOTE — ED Notes (Signed)
Ambulated pt in room, pt able to bear weight but unsteady, per family that is pts normal gait

## 2017-07-02 ENCOUNTER — Emergency Department
Admission: EM | Admit: 2017-07-02 | Discharge: 2017-07-02 | Disposition: A | Discharge status: Home / Self Care | Payer: Medicare Other | Attending: Student in an Organized Health Care Education/Training Program | Admitting: Student in an Organized Health Care Education/Training Program

## 2017-07-02 ENCOUNTER — Emergency Department: Payer: Medicare Other

## 2017-07-02 ENCOUNTER — Encounter: Payer: Self-pay | Admitting: Emergency Medicine

## 2017-07-02 DIAGNOSIS — Z87891 Personal history of nicotine dependence: Secondary | ICD-10-CM | POA: Diagnosis not present

## 2017-07-02 DIAGNOSIS — W01198A Fall on same level from slipping, tripping and stumbling with subsequent striking against other object, initial encounter: Secondary | ICD-10-CM | POA: Insufficient documentation

## 2017-07-02 DIAGNOSIS — Y929 Unspecified place or not applicable: Secondary | ICD-10-CM | POA: Diagnosis not present

## 2017-07-02 DIAGNOSIS — Z79899 Other long term (current) drug therapy: Secondary | ICD-10-CM | POA: Diagnosis not present

## 2017-07-02 DIAGNOSIS — F0391 Unspecified dementia with behavioral disturbance: Secondary | ICD-10-CM | POA: Insufficient documentation

## 2017-07-02 DIAGNOSIS — Y9389 Activity, other specified: Secondary | ICD-10-CM | POA: Diagnosis not present

## 2017-07-02 DIAGNOSIS — Z7901 Long term (current) use of anticoagulants: Secondary | ICD-10-CM | POA: Insufficient documentation

## 2017-07-02 DIAGNOSIS — I10 Essential (primary) hypertension: Secondary | ICD-10-CM | POA: Diagnosis not present

## 2017-07-02 DIAGNOSIS — S098XXA Other specified injuries of head, initial encounter: Secondary | ICD-10-CM | POA: Diagnosis not present

## 2017-07-02 DIAGNOSIS — S0990XA Unspecified injury of head, initial encounter: Secondary | ICD-10-CM

## 2017-07-02 DIAGNOSIS — Y999 Unspecified external cause status: Secondary | ICD-10-CM | POA: Diagnosis not present

## 2017-07-02 DIAGNOSIS — W19XXXA Unspecified fall, initial encounter: Secondary | ICD-10-CM

## 2017-07-02 NOTE — ED Notes (Signed)
Patient back from CT.

## 2017-07-02 NOTE — ED Provider Notes (Signed)
[facility] Emergency Department Provider Note    First MD Initiated Contact with Patient 07/02/17 1047     (approximate)  I have reviewed the triage vital signs and the nursing notes.   HISTORY  Chief Complaint Fall  Level V Caveat:  Dementia  HPI John Summers is a 83 y.o. male with a history of dementia presenting from [facility] after a witnessed mechanical fall.  Reportedly staff were helping the patient ambulate and the patient lost his footing and fell and did hit the back of his head.  Not on any blood thinners.  Patient denies any pain at this time.  Blood he is sitting however he does have shortened and left lower leg but denies any pain at this time.  There was no loss of consciousness or altered mental status.  Past Medical History:  Diagnosis Date  . Colon adenocarcinoma ([place])   . Complication of anesthesia    severe confusion  . GERD (gastroesophageal reflux disease)   . History of colon polyps   . History of gallstones   . Hx of transfusion of platelets   . Hypertension   . ITP (idiopathic thrombocytopenic purpura)    Dr. Grayland Ormond follows  . Kidney stones   . Lewy body dementia with behavioral disturbance   . Major depressive disorder, recurrent episode, in partial remission with anxious distress ([place])   . Pancreatitis 2/11  . Pollen allergies   . Skin cancer    ears  . Sleep apnea    "won't wear mask anymore" (12/22/2012)  . Unspecified essential hypertension    Family History  Problem Relation Age of Onset  . Colon cancer Mother   . Heart disease Mother   . Hypertension Mother   . Alcohol abuse Father   . Esophageal cancer Neg Hx   . Rectal cancer Neg Hx   . Stomach cancer Neg Hx    Past Surgical History:  Procedure Laterality Date  . CARDIAC CATHETERIZATION  2000's   "tx'd w/acid reflux RX" (12/22/2012)  . CHOLECYSTECTOMY  2011  . ESOPHAGEAL DILATION  1990's?  Marland Kitchen HERNIA REPAIR  12/22/2012   IHR w/mesh  .  INGUINAL HERNIA REPAIR Left [id number]  . INSERTION OF MESH N/A 12/22/2012   Procedure: INSERTION OF MESH;  Surgeon: Harl Bowie, MD;  Location: [place];  Service: General;  Laterality: N/A;  . LITHOTRIPSY  2008   x2  . PARTIAL COLECTOMY  03/21/2012   Procedure: PARTIAL COLECTOMY;  Surgeon: Harl Bowie, MD;  Location: [place];  Service: General;  Laterality: Right;  . PILONIDAL CYST EXCISION  1960's?  Marland Kitchen SKIN CANCER EXCISION     "face; ?left ear" (12/22/2012)  . TONSILLECTOMY AND ADENOIDECTOMY  1946  . TOTAL HIP ARTHROPLASTY Left ~ 2002  . UPPER GASTROINTESTINAL ENDOSCOPY    . VENTRAL HERNIA REPAIR N/A 12/22/2012   Procedure: HERNIA REPAIR INCISIONAL ;  Surgeon: Harl Bowie, MD;  Location: [place];  Service: General;  Laterality: N/A;   Patient Active Problem List   Diagnosis Date Noted  . Generalized osteoarthritis 03/05/2017  . AF (paroxysmal atrial fibrillation) ([place]) 02/01/2017  . Near syncope 01/06/2017  . Gait disturbance 12/30/2016  . MGUS (monoclonal gammopathy of unknown significance) 05/10/2016  . Pancreatic mass 04/24/2016  . Thrombocytopenia ([place]) 10/04/2015  . Uncontrolled REM sleep behavior disorder 11/16/2014  . Lewy body dementia with behavioral disturbance 11/16/2014  . Tremor of right hand 11/16/2014  . Major depressive disorder, recurrent episode,  in partial remission with anxious distress ([place])   . REM sleep behavior disorder 12/07/2013  . Incisional hernia, without obstruction or gangrene 11/14/2012  . Adenocarcinoma of cecum ([place]) 03/19/2012  . GERD (gastroesophageal reflux disease) 03/19/2012  . Sleep apnea   . Essential hypertension       Prior to Admission medications   Medication Sig Start Date End Date Taking? Authorizing Provider  acetaminophen (TYLENOL) 325 MG tablet Take 650 mg by mouth 3 (three) times daily.    [provider]  ALPRAZolam Duanne Moron) 0.5 MG tablet Take 1 tablet (0.5 mg total) by mouth at bedtime as needed for  anxiety. 02/02/17   Henreitta Leber, MD  donepezil (ARICEPT) 10 MG tablet take 1 tablet by mouth once daily 11/09/16   Dohmeier, Asencion Partridge, MD  FLUoxetine (PROZAC) 20 MG tablet Take 2 tablets (40 mg total) by mouth daily. 2 tab of 20 mg once a day. 04/19/17   Dohmeier, Asencion Partridge, MD  hydrOXYzine (ATARAX/VISTARIL) 10 MG tablet take 1 tablet by mouth at bedtime 12/07/16   Dohmeier, Asencion Partridge, MD  Melatonin 5 MG TABS Take 1 tablet (5 mg total) by mouth at bedtime. 04/19/17   Dohmeier, Asencion Partridge, MD  QUEtiapine (SEROQUEL) 25 MG tablet Take 0.5 tablets (12.5 mg total) by mouth at bedtime. 04/19/17   Dohmeier, Asencion Partridge, MD  QUEtiapine (SEROQUEL) 25 MG tablet Take 1 tablet (25 mg total) by mouth at bedtime. 05/18/17   Dohmeier, Asencion Partridge, MD  traMADol (ULTRAM) 50 MG tablet Take 50 mg by mouth 3 (three) times daily as needed (pain).    [provider]    Allergies Aspirin; Chicken allergy; and Other    Social History Social History  Substance Use Topics  . Smoking status: Former Smoker    Types: Pipe  . Smokeless tobacco: Never Used     Comment: i4/06/2013 "only smoked pipe n college 1958"  . Alcohol use No    Review of Systems Patient denies headaches, rhinorrhea, blurry vision, numbness, shortness of breath, chest pain, edema, cough, abdominal pain, nausea, vomiting, diarrhea, dysuria, fevers, rashes or hallucinations unless otherwise stated above in HPI. ____________________________________________   PHYSICAL EXAM:  VITAL SIGNS: Vitals:   07/02/17 1042  BP: 107/81  Pulse: 80  Resp: 18  Temp: 98.1 F (36.7 C)  SpO2: 98%    Constitutional: Alert and oriented. in no acute distress. Eyes: Conjunctivae are normal.  Head: Atraumatic. Nose: No congestion/rhinnorhea. Mouth/Throat: Mucous membranes are moist.   Neck: No stridor. Painless ROM.  Cardiovascular: Normal rate, regular rhythm. Grossly normal heart sounds.  Good peripheral circulation. Respiratory: Normal respiratory effort.  No  retractions. Lungs CTAB. Gastrointestinal: Soft and nontender. No distention. No abdominal bruits. No CVA tenderness. Genitourinary:  Musculoskeletal: No lower extremity tenderness nor edema.  No joint effusions.  LLE shortened and internally rotated but without pain to ROM Neurologic:  Normal speech and language. No gross focal neurologic deficits are appreciated. No facial droop Skin:  Skin is warm, dry and intact. No rash noted. Psychiatric: Mood and affect are normal. Speech and behavior are normal.  ____________________________________________   LABS (all labs ordered are listed, but only abnormal results are displayed)  No results found for this or any previous visit (from the past 24 hour(s)). ____________________________________________  EKG________________________________  RADIOLOGY  I personally reviewed all radiographic images ordered to evaluate for the above acute complaints and reviewed radiology reports and findings.  These findings were personally discussed with the patient.  Please see medical record for radiology report.  ____________________________________________   PROCEDURES  Procedure(s) performed:  Procedures    Critical Care performed: no ____________________________________________   INITIAL IMPRESSION / ASSESSMENT AND PLAN / ED COURSE  Pertinent labs & imaging results that were available during my care of the patient were reviewed by me and considered in my medical decision making (see chart for details).  DDX: iph, sah, sdh, concussion, fracture, dislocation  IBN STIEF is a 83 y.o. who presents to the ED with witnessed mechanical fall with head injury and hip injury as described above.  Patient otherwise hemodynamically stable and in no arrest.  CT and radiographs ordered for the above differential showed no acute trauma.  Most consistent with minor head injury and contusion.  Patient stable for discharge home with follow-up with  PCP.      ____________________________________________   FINAL CLINICAL IMPRESSION(S) / ED DIAGNOSES  Final diagnoses:  Fall, initial encounter  Minor head injury, initial encounter      NEW MEDICATIONS STARTED DURING THIS VISIT:  New Prescriptions   No medications on file     Note:  This document was prepared using Dragon voice recognition software and may include unintentional dictation errors.    Merlyn Lot, MD 07/02/17 1524

## 2017-07-02 NOTE — ED Triage Notes (Signed)
Pt arrived via EMS from [facility] for reports of witnessed mechanical fall. Pt is reported to have been ambulating with staff and lost his footing. EMS reports staff reported helping to break fall but pt hit the back of his head on the floor. Pt is not reported to be on blood thinners. EMS reports VSS. Pt is reported at baseline. Pt has history of dementia.

## 2017-07-21 ENCOUNTER — Emergency Department
Admission: EM | Admit: 2017-07-21 | Discharge: 2017-07-22 | Disposition: A | Discharge status: Home / Self Care | Payer: Medicare Other | Attending: Emergency Medicine | Admitting: Emergency Medicine

## 2017-07-21 ENCOUNTER — Emergency Department: Payer: Medicare Other

## 2017-07-21 DIAGNOSIS — Z87891 Personal history of nicotine dependence: Secondary | ICD-10-CM | POA: Diagnosis not present

## 2017-07-21 DIAGNOSIS — Z85828 Personal history of other malignant neoplasm of skin: Secondary | ICD-10-CM | POA: Insufficient documentation

## 2017-07-21 DIAGNOSIS — I10 Essential (primary) hypertension: Secondary | ICD-10-CM | POA: Insufficient documentation

## 2017-07-21 DIAGNOSIS — Z79899 Other long term (current) drug therapy: Secondary | ICD-10-CM | POA: Insufficient documentation

## 2017-07-21 DIAGNOSIS — R55 Syncope and collapse: Secondary | ICD-10-CM | POA: Diagnosis present

## 2017-07-21 LAB — CBC
HEMATOCRIT: 34.3 % — AB (ref 40.0–52.0)
HEMOGLOBIN: 11.4 g/dL — AB (ref 13.0–18.0)
MCH: 34.5 pg — AB (ref 26.0–34.0)
MCHC: 33.2 g/dL (ref 32.0–36.0)
MCV: 104.1 fL — ABNORMAL HIGH (ref 80.0–100.0)
Platelets: 96 10*3/uL — ABNORMAL LOW (ref 150–440)
RBC: 3.3 MIL/uL — ABNORMAL LOW (ref 4.40–5.90)
RDW: 13 % (ref 11.5–14.5)
WBC: 3 10*3/uL — ABNORMAL LOW (ref 3.8–10.6)

## 2017-07-21 LAB — BASIC METABOLIC PANEL
Anion gap: 9 (ref 5–15)
BUN: 18 mg/dL (ref 6–20)
CALCIUM: 8.7 mg/dL — AB (ref 8.9–10.3)
CO2: 24 mmol/L (ref 22–32)
Chloride: 106 mmol/L (ref 101–111)
Creatinine, Ser: 0.87 mg/dL (ref 0.61–1.24)
GFR calc Af Amer: >60 mL/min (ref >60)
GLUCOSE: 106 mg/dL — AB (ref 65–99)
Potassium: 3.8 mmol/L (ref 3.5–5.1)
Sodium: 139 mmol/L (ref 135–145)

## 2017-07-21 LAB — HEPATIC FUNCTION PANEL
ALK PHOS: 77 U/L (ref 38–126)
ALT: 12 U/L — AB (ref 17–63)
AST: 22 U/L (ref 15–41)
Albumin: 3.5 g/dL (ref 3.5–5.0)
TOTAL PROTEIN: 7.3 g/dL (ref 6.5–8.1)
Total Bilirubin: 0.5 mg/dL (ref 0.3–1.2)

## 2017-07-21 LAB — LIPASE, BLOOD: LIPASE: 92 U/L — AB (ref 11–51)

## 2017-07-21 NOTE — Discharge Instructions (Signed)
Please follow-up closely with cardiology, as well as your primary care doctor.  Today we noted that patient's 'lipase' was elevated, and I recommend you follow-up with your primary doctor regarding this. Please setup follow-up with cardiology regarding today's episode of passing out.  Please call your regular doctor as soon as possible to schedule the next available clinic appointment to follow up with him/her regarding your visit to the ED and your symptoms.  Return to the Emergency Department (ED)  if you have any further syncopal episodes (pass out again) or develop ANY chest pain, pressure, tightness, trouble breathing, sudden sweating, or other symptoms that concern you.

## 2017-07-21 NOTE — ED Triage Notes (Signed)
Pt arrives via [facility] with reports of syncope while sitting in chair at home [facility]) around 5:45pm. Pt son witnessed and reported no fall out of chair to EMS. Pt has dementia and hypertension. Pt in NAD at this time. States 6/10 pain (headache). Pt alert and oriented to self, location; disoriented to situation (states he was brought in for chest pains although denies any chest discomfort at this time).

## 2017-07-21 NOTE — ED Notes (Signed)
Patient transported to CT 

## 2017-07-21 NOTE — ED Notes (Signed)
Pt unable to void at this time. 

## 2017-07-21 NOTE — ED Provider Notes (Signed)
[facility] Emergency Department Provider Note   ____________________________________________   First MD Initiated Contact with Patient 07/21/17 2030     (approximate)  I have reviewed the triage vital signs and the nursing notes.   HISTORY  Chief Complaint Near Syncope  EM caveat: Patient dementia, confusion limits exam and history  HPI John Summers is a 83 y.o. male who was witnessed to pass out after walking from the dining room today by his step-daughter and son.  Family reports that he had eaten, while walking using his walker he started to seem very lightheaded.  A 6 noticed he seemed a little bit "out of it".  He then seem to slowly become more fatigued and came to the point that they had to lower him to the ground.  He did not fall but they believe he passed out briefly.  He then let him lay on the ground for a few minutes, he stood up and they were able to walk him to his bed but he seemed to develop fatigue thereafter again.  Family reports that he has had a history of multiple falls and similar situations where he will walk, then becomes very fatigued, and then seemingly comes to the point where he almost passes out.  This happened several times recently in the last couple of months.  They report at present he is acting normally.  He has confusion at baseline due to his Lewy body dementia.  They have not noticed any changes in his speech, any new weakness, any nausea or vomiting.  No fevers or chills and his health is been at baseline recently  Patient himself denies any pain or discomfort.  Denies any concerns at this time.    Past Medical History:  Diagnosis Date  . Colon adenocarcinoma ([place])   . Complication of anesthesia    severe confusion  . GERD (gastroesophageal reflux disease)   . History of colon polyps   . History of gallstones   . Hx of transfusion of platelets   . Hypertension   . ITP (idiopathic thrombocytopenic purpura)     Dr. Grayland Ormond follows  . Kidney stones   . Lewy body dementia with behavioral disturbance   . Major depressive disorder, recurrent episode, in partial remission with anxious distress ([place])   . Pancreatitis 2/11  . Pollen allergies   . Skin cancer    ears  . Sleep apnea    "won't wear mask anymore" (12/22/2012)  . Unspecified essential hypertension     Patient Active Problem List   Diagnosis Date Noted  . Generalized osteoarthritis 03/05/2017  . AF (paroxysmal atrial fibrillation) ([place]) 02/01/2017  . Near syncope 01/06/2017  . Gait disturbance 12/30/2016  . MGUS (monoclonal gammopathy of unknown significance) 05/10/2016  . Pancreatic mass 04/24/2016  . Thrombocytopenia ([place]) 10/04/2015  . Uncontrolled REM sleep behavior disorder 11/16/2014  . Lewy body dementia with behavioral disturbance 11/16/2014  . Tremor of right hand 11/16/2014  . Major depressive disorder, recurrent episode, in partial remission with anxious distress ([place])   . REM sleep behavior disorder 12/07/2013  . Incisional hernia, without obstruction or gangrene 11/14/2012  . Adenocarcinoma of cecum ([place]) 03/19/2012  . GERD (gastroesophageal reflux disease) 03/19/2012  . Sleep apnea   . Essential hypertension     Past Surgical History:  Procedure Laterality Date  . CARDIAC CATHETERIZATION  2000's   "tx'd w/acid reflux RX" (12/22/2012)  . CHOLECYSTECTOMY  2011  . ESOPHAGEAL DILATION  1990's?  Marland Kitchen  HERNIA REPAIR  12/22/2012   IHR w/mesh  . INGUINAL HERNIA REPAIR Left [id number]  . LITHOTRIPSY  2008   x2  . PILONIDAL CYST EXCISION  1960's?  Marland Kitchen SKIN CANCER EXCISION     "face; ?left ear" (12/22/2012)  . TONSILLECTOMY AND ADENOIDECTOMY  1946  . TOTAL HIP ARTHROPLASTY Left ~ 2002  . UPPER GASTROINTESTINAL ENDOSCOPY      Prior to Admission medications   Medication Sig Start Date End Date Taking? Authorizing Provider  acetaminophen (TYLENOL) 325 MG tablet Take 650 mg by mouth 3 (three) times daily.    [provider]  ALPRAZolam Duanne Moron) 0.5 MG tablet Take 1 tablet (0.5 mg total) by mouth at bedtime as needed for anxiety. Patient not taking: Reported on 07/02/2017 02/02/17   Henreitta Leber, MD  donepezil (ARICEPT) 10 MG tablet take 1 tablet by mouth once daily 11/09/16   Dohmeier, Asencion Partridge, MD  FLUoxetine (PROZAC) 20 MG tablet Take 2 tablets (40 mg total) by mouth daily. 2 tab of 20 mg once a day. 04/19/17   Dohmeier, Asencion Partridge, MD  hydrOXYzine (ATARAX/VISTARIL) 10 MG tablet take 1 tablet by mouth at bedtime 12/07/16   Dohmeier, Asencion Partridge, MD  Melatonin 5 MG TABS Take 1 tablet (5 mg total) by mouth at bedtime. 04/19/17   Dohmeier, Asencion Partridge, MD  MELATONIN PO Take 1 tablet by mouth at bedtime.    [provider]  QUEtiapine (SEROQUEL) 25 MG tablet Take 0.5 tablets (12.5 mg total) by mouth at bedtime. Patient not taking: Reported on 07/02/2017 04/19/17   Dohmeier, Asencion Partridge, MD  QUEtiapine (SEROQUEL) 25 MG tablet Take 1 tablet (25 mg total) by mouth at bedtime. 05/18/17   Dohmeier, Asencion Partridge, MD  spironolactone (ALDACTONE) 25 MG tablet Take 25 mg by mouth daily.    [provider]  sulfamethoxazole-trimethoprim (BACTRIM DS,SEPTRA DS) 800-160 MG tablet Take 1 tablet by mouth 2 (two) times daily.    [provider]  traMADol (ULTRAM) 50 MG tablet Take 50 mg by mouth 3 (three) times daily as needed (pain).    [provider]  Vitamin D, Ergocalciferol, (DRISDOL) 50000 units CAPS capsule Take 50,000 Units by mouth every 7 (seven) days.    [provider]    Allergies Aspirin; Chicken allergy; and Other  Family History  Problem Relation Age of Onset  . Colon cancer Mother   . Heart disease Mother   . Hypertension Mother   . Alcohol abuse Father   . Esophageal cancer Neg Hx   . Rectal cancer Neg Hx   . Stomach cancer Neg Hx     Social History Social History   Tobacco Use  . Smoking status: Former Smoker    Types: Pipe  . Smokeless tobacco: Never Used  . Tobacco  comment: i4/06/2013 "only smoked pipe n college 1958"  Substance Use Topics  . Alcohol use: No    Alcohol/week: 0.0 oz  . Drug use: No    Review of Systems - per family and patient Constitutional: No fever/chills Cardiovascular: Denies chest pain. Respiratory: Denies shortness of breath. Gastrointestinal: No abdominal pain.  No nausea, no vomiting.   Genitourinary: Negative for dysuria. Musculoskeletal: Negative for back pain. Skin: Negative for rash. Neurological: Negative for headaches, weakness or numbness.    ____________________________________________   PHYSICAL EXAM:  VITAL SIGNS: ED Triage Vitals  Enc Vitals Group     BP 07/21/17 2023 (!) 142/77     Pulse Rate 07/21/17 2023 (!) 58     Resp 07/21/17  2023 16     Temp 07/21/17 2023 98.8 F (37.1 C)     Temp Source 07/21/17 2023 Oral     SpO2 07/21/17 2023 98 %     Weight 07/21/17 2024 200 lb (90.7 kg)     Height 07/21/17 2024 6' (1.829 m)     Head Circumference --      Peak Flow --      Pain Score 07/21/17 2022 6     Pain Loc --      Pain Edu? --      Excl. in [place]? --     Constitutional: Alert and oriented to self and family, but not to year or place. Well appearing and in no acute distress.  Appears well cared for.  He is calm and exhibits no signs of psychomotor agitation. Eyes: Conjunctivae are normal. Head: Atraumatic. Nose: No congestion/rhinnorhea. Mouth/Throat: Mucous membranes are moist. Neck: No stridor.  No cervical tenderness Cardiovascular: Normal rate, regular rhythm. Grossly normal heart sounds.  Good peripheral circulation. Respiratory: Normal respiratory effort.  No retractions. Lungs CTAB. Gastrointestinal: Soft and nontender. No distention. Musculoskeletal: No lower extremity tenderness nor edema. Neurologic:  Normal speech and language. No gross focal neurologic deficits are appreciated.  Moves all extremities with normal strength.  No pronator drift. Skin:  Skin is warm, dry and intact.  No rash noted. Psychiatric: Mood and affect are fairly flat and somewhat withdrawn.  He seems very hard of hearing, but when spoken to follows commands well.  ____________________________________________   LABS (all labs ordered are listed, but only abnormal results are displayed)  Labs Reviewed  BASIC METABOLIC PANEL - Abnormal; Notable for the following components:      Result Value   Glucose, Bld 106 (*)    Calcium 8.7 (*)    All other components within normal limits  CBC - Abnormal; Notable for the following components:   WBC 3.0 (*)    RBC 3.30 (*)    Hemoglobin 11.4 (*)    HCT 34.3 (*)    MCV 104.1 (*)    MCH 34.5 (*)    Platelets 96 (*)    All other components within normal limits  LIPASE, BLOOD - Abnormal; Notable for the following components:   Lipase 92 (*)    All other components within normal limits  HEPATIC FUNCTION PANEL - Abnormal; Notable for the following components:   ALT 12 (*)    Bilirubin, Direct <0.1 (*)    All other components within normal limits  URINALYSIS, COMPLETE (UACMP) WITH MICROSCOPIC   ____________________________________________  EKG  Reviewed and interpreted by me at 2025 Heart rate 60 QRS 110 QTC 480 Normal sinus rhythm, some baseline artifact/tremor, no evidence of ischemia is noted ____________________________________________  RADIOLOGY  Ct Head Wo Contrast  Result Date: 07/21/2017 CLINICAL DATA:  Syncope EXAM: CT HEAD WITHOUT CONTRAST TECHNIQUE: Contiguous axial images were obtained from the base of the skull through the vertex without intravenous contrast. COMPARISON:  07/02/2017 FINDINGS: Brain: No acute territorial infarction, hemorrhage or intracranial mass is visualized. Moderate atrophy. Mild small vessel ischemic changes of the white matter. Stable ventricle size Vascular: No hyperdense vessels.  Carotid artery calcification. Skull: No fracture Sinuses/Orbits: Mild mucosal thickening in the ethmoid sinuses. No acute orbital  abnormality. Other: None IMPRESSION: No CT evidence for acute intracranial abnormality. Atrophy and small vessel ischemic changes of the white matter. Electronically Signed   By: Donavan Foil M.D.   On: 07/21/2017 21:22    CTA head  reviewed, no acute abnormality noted ____________________________________________   PROCEDURES  Procedure(s) performed: None  Procedures  Critical Care performed: No  ____________________________________________   INITIAL IMPRESSION / ASSESSMENT AND PLAN / ED COURSE  Pertinent labs & imaging results that were available during my care of the patient were reviewed by me and considered in my medical decision making (see chart for details).  Patient with a history of frequent falls, today had a fall in a somewhat similar fashion the previous but was noted to have a brief episode of syncope associated.  Family reports there is suspicious that he has had similar episodes of possibly syncope in the past, but this 1 was definitely witnessed by his stepdaughter and stepson who stepdaughter reports being his power of attorney  At the present time patient appears to be at his baseline.  Given his syncope, etiology is unclear but certainly consider possibly dehydrated, cardiac, but felt less likely neurologic.  Denies any respiratory symptoms his vital signs are stable.  Unclear as the exact cause, also consider orthostatic.  Possibly vasovagal given the onset associated after eating and the fact that in a previous episode at a restaurant which was described as similar that occurred just after eating.  ----------------------------------------- 11:44 PM on 07/21/2017 -----------------------------------------  After thorough evaluation, no clear etiology for syncope is revealed.  I discussed with his family including power of attorney who is at the bedside, and we discussed goals of care and offered observation for further evaluation and monitoring including evaluating  for possible cardiac arrhythmia.  After discussing the patient's goals of care, shared medical decision making and the patient's stepson and stepdaughter reports her primary goal is comfort, they wish to take him home and would rather follow up with cardiology and also understand that his lipase today is slightly elevated and will follow up with his primary regarding this.  I think the plan of care is reasonable, patient resting comfortably with stable hemodynamics and no distress.  We will discharge him to home in the care of his family with plan for careful follow-up and return precautions discussed.      ____________________________________________   FINAL CLINICAL IMPRESSION(S) / ED DIAGNOSES  Final diagnoses:  Syncope and collapse      NEW MEDICATIONS STARTED DURING THIS VISIT:  This SmartLink is deprecated. Use AVSMEDLIST instead to display the medication list for a patient.   Note:  This document was prepared using Dragon voice recognition software and may include unintentional dictation errors.     Delman Kitten, MD 07/21/17 [phone]

## 2017-07-21 NOTE — ED Notes (Signed)
ED Provider at bedside. 

## 2017-07-21 NOTE — ED Notes (Addendum)
Pt ambulatory to wheelchair with assistance. Family accompanying pt back to [facility]. Report called to [place] at [facility]. Family verbalized understanding of discharge instructions and follow-up care.

## 2017-07-27 ENCOUNTER — Telehealth: Payer: Self-pay | Admitting: Neurology

## 2017-07-27 NOTE — Telephone Encounter (Signed)
Angel from the facility where the patient is a placed at. The provider of the patient is requesting that the patient get in with Dr Dohmeier due to the pt having repeated episodes of syncope and mult TIA's. I have informed Glenard Haring that we are booked out until Feb. At this time I placed the patient on a sleep consult slot in Dec on the 19 th in the hopes that there may be an opening between now and then. I have placed the pt on the wait list. Glenard Haring was appreciative.

## 2017-07-29 ENCOUNTER — Ambulatory Visit: Payer: Self-pay | Admitting: Neurology

## 2017-07-29 ENCOUNTER — Telehealth: Payer: Self-pay | Admitting: Neurology

## 2017-07-29 NOTE — Telephone Encounter (Signed)
Called the facility he stays at to make them aware that we had a cancellation for today at 11:00 or 11:30. Wanted to offer the patient that apt. Will wait to see if she calls back. I believe there is also a time opening on Monday the 19th could be used for the patient also

## 2017-07-29 NOTE — Telephone Encounter (Signed)
As of right now they have accepted the apt for 11:00 am today. She has got to arrange transportation and so if there is a problem she will call right back and take the Monday slot.

## 2017-07-31 ENCOUNTER — Emergency Department: Payer: Medicare Other

## 2017-07-31 ENCOUNTER — Emergency Department
Admission: EM | Admit: 2017-07-31 | Discharge: 2017-07-31 | Disposition: A | Discharge status: Nursing Home (Includes ICF) | Payer: Medicare Other | Attending: Emergency Medicine | Admitting: Emergency Medicine

## 2017-07-31 ENCOUNTER — Other Ambulatory Visit: Payer: Self-pay

## 2017-07-31 ENCOUNTER — Encounter: Payer: Self-pay | Admitting: Emergency Medicine

## 2017-07-31 DIAGNOSIS — Y9210 Unspecified residential institution as the place of occurrence of the external cause: Secondary | ICD-10-CM | POA: Insufficient documentation

## 2017-07-31 DIAGNOSIS — S20309A Unspecified superficial injuries of unspecified front wall of thorax, initial encounter: Secondary | ICD-10-CM | POA: Diagnosis present

## 2017-07-31 DIAGNOSIS — W19XXXA Unspecified fall, initial encounter: Secondary | ICD-10-CM | POA: Insufficient documentation

## 2017-07-31 DIAGNOSIS — Z87891 Personal history of nicotine dependence: Secondary | ICD-10-CM | POA: Diagnosis not present

## 2017-07-31 DIAGNOSIS — G3183 Dementia with Lewy bodies: Secondary | ICD-10-CM | POA: Insufficient documentation

## 2017-07-31 DIAGNOSIS — S2241XA Multiple fractures of ribs, right side, initial encounter for closed fracture: Secondary | ICD-10-CM

## 2017-07-31 DIAGNOSIS — I10 Essential (primary) hypertension: Secondary | ICD-10-CM | POA: Insufficient documentation

## 2017-07-31 DIAGNOSIS — Y939 Activity, unspecified: Secondary | ICD-10-CM | POA: Insufficient documentation

## 2017-07-31 DIAGNOSIS — Z79899 Other long term (current) drug therapy: Secondary | ICD-10-CM | POA: Diagnosis not present

## 2017-07-31 DIAGNOSIS — Y999 Unspecified external cause status: Secondary | ICD-10-CM | POA: Diagnosis not present

## 2017-07-31 MED ORDER — TRAMADOL HCL 50 MG PO TABS: 50.0000 mg | ORAL_TABLET | Freq: Four times a day (QID) | ORAL | 0 refills | Status: DC | PRN

## 2017-07-31 MED ORDER — TRAMADOL HCL 50 MG PO TABS
50.0000 mg | ORAL_TABLET | Freq: Once | ORAL | Status: AC
  Administered 2017-07-31: 50 mg via ORAL
  Filled 2017-07-31: qty 1

## 2017-07-31 NOTE — ED Notes (Signed)
Pt. family Verbalizes understanding of d/c instructions, medications, and follow-up. VS stable and pain controlled per pt.  Pt. In NAD at time of d/c and denies further concerns regarding this visit. Pt. Stable at the time of departure from the unit, departing unit by the safest and most appropriate manner per that pt condition and limitations with all belongings accounted for. Pt advised to return to the ED at any time for emergent concerns, or for new/worsening symptoms.

## 2017-07-31 NOTE — ED Notes (Signed)
Pt head lac cleaned by this RN. Dressing unwarranted at this time

## 2017-07-31 NOTE — ED Provider Notes (Signed)
[facility] Emergency Department Provider Note    First MD Initiated Contact with Patient 07/31/17 [phone]     (approximate)  I have reviewed the triage vital signs and the nursing notes.  History limited secondary to dementia   HISTORY  Chief Complaint Fall   HPI John Summers is a 83 y.o. male with below list of chronic medical conditions including Lewy body dementia presents to the emergency department via EMS from [facility] following unwitnessed fall which occurred during 230 and 3:30 AM this morning.  Per EMS staff states that patient fell out of bed.  Patient admits to right chest pain.   Past Medical History:  Diagnosis Date  . Colon adenocarcinoma ([place])   . Complication of anesthesia    severe confusion  . GERD (gastroesophageal reflux disease)   . History of colon polyps   . History of gallstones   . Hx of transfusion of platelets   . Hypertension   . ITP (idiopathic thrombocytopenic purpura)    Dr. Grayland Ormond follows  . Kidney stones   . Lewy body dementia with behavioral disturbance   . Major depressive disorder, recurrent episode, in partial remission with anxious distress ([place])   . Pancreatitis 2/11  . Pollen allergies   . Skin cancer    ears  . Sleep apnea    "won't wear mask anymore" (12/22/2012)  . Unspecified essential hypertension     Patient Active Problem List   Diagnosis Date Noted  . Generalized osteoarthritis 03/05/2017  . AF (paroxysmal atrial fibrillation) ([place]) 02/01/2017  . Near syncope 01/06/2017  . Gait disturbance 12/30/2016  . MGUS (monoclonal gammopathy of unknown significance) 05/10/2016  . Pancreatic mass 04/24/2016  . Thrombocytopenia ([place]) 10/04/2015  . Uncontrolled REM sleep behavior disorder 11/16/2014  . Lewy body dementia with behavioral disturbance 11/16/2014  . Tremor of right hand 11/16/2014  . Major depressive disorder, recurrent episode, in partial remission with anxious distress ([place])   .  REM sleep behavior disorder 12/07/2013  . Incisional hernia, without obstruction or gangrene 11/14/2012  . Adenocarcinoma of cecum ([place]) 03/19/2012  . GERD (gastroesophageal reflux disease) 03/19/2012  . Sleep apnea   . Essential hypertension     Past Surgical History:  Procedure Laterality Date  . CARDIAC CATHETERIZATION  2000's   "tx'd w/acid reflux RX" (12/22/2012)  . CHOLECYSTECTOMY  2011  . ESOPHAGEAL DILATION  1990's?  Marland Kitchen HERNIA REPAIR  12/22/2012   IHR w/mesh  . HERNIA REPAIR INCISIONAL N/A 12/22/2012   Performed by Harl Bowie, MD at [place]  . INGUINAL HERNIA REPAIR Left [id number]  . INSERTION OF MESH N/A 12/22/2012   Performed by Harl Bowie, MD at [place]  . LAPAROSCOPIC RIGHT COLECTOMY Right 03/21/2012   Performed by Harl Bowie, MD at [facility]  . LITHOTRIPSY  2008   x2  . PARTIAL COLECTOMY Right 03/21/2012   Performed by Harl Bowie, MD at [place]  . PILONIDAL CYST EXCISION  1960's?  Marland Kitchen SKIN CANCER EXCISION     "face; ?left ear" (12/22/2012)  . TONSILLECTOMY AND ADENOIDECTOMY  1946  . TOTAL HIP ARTHROPLASTY Left ~ 2002  . UPPER GASTROINTESTINAL ENDOSCOPY      Prior to Admission medications   Medication Sig Start Date End Date Taking? Authorizing Provider  acetaminophen (TYLENOL) 325 MG tablet Take 650 mg by mouth 3 (three) times daily.    [provider]  ALPRAZolam Duanne Moron) 0.5 MG tablet Take 1 tablet (0.5 mg  total) by mouth at bedtime as needed for anxiety. Patient not taking: Reported on 07/02/2017 02/02/17   Henreitta Leber, MD  donepezil (ARICEPT) 10 MG tablet take 1 tablet by mouth once daily 11/09/16   Dohmeier, Asencion Partridge, MD  FLUoxetine (PROZAC) 20 MG tablet Take 2 tablets (40 mg total) by mouth daily. 2 tab of 20 mg once a day. 04/19/17   Dohmeier, Asencion Partridge, MD  hydrOXYzine (ATARAX/VISTARIL) 10 MG tablet take 1 tablet by mouth at bedtime 12/07/16   Dohmeier, Asencion Partridge, MD  Melatonin 5 MG TABS Take 1 tablet (5 mg total) by mouth at bedtime.  04/19/17   Dohmeier, Asencion Partridge, MD  MELATONIN PO Take 1 tablet by mouth at bedtime.    [provider]  QUEtiapine (SEROQUEL) 25 MG tablet Take 0.5 tablets (12.5 mg total) by mouth at bedtime. Patient not taking: Reported on 07/02/2017 04/19/17   Dohmeier, Asencion Partridge, MD  QUEtiapine (SEROQUEL) 25 MG tablet Take 1 tablet (25 mg total) by mouth at bedtime. 05/18/17   Dohmeier, Asencion Partridge, MD  spironolactone (ALDACTONE) 25 MG tablet Take 25 mg by mouth daily.    [provider]  sulfamethoxazole-trimethoprim (BACTRIM DS,SEPTRA DS) 800-160 MG tablet Take 1 tablet by mouth 2 (two) times daily.    [provider]  traMADol (ULTRAM) 50 MG tablet Take 50 mg by mouth 3 (three) times daily as needed (pain).    [provider]  traMADol (ULTRAM) 50 MG tablet Take 1 tablet (50 mg total) every 6 (six) hours as needed by mouth. 07/31/17 07/31/18  Gregor Hams, MD  Vitamin D, Ergocalciferol, (DRISDOL) 50000 units CAPS capsule Take 50,000 Units by mouth every 7 (seven) days.    [provider]    Allergies Aspirin; Chicken allergy; and Other  Family History  Problem Relation Age of Onset  . Colon cancer Mother   . Heart disease Mother   . Hypertension Mother   . Alcohol abuse Father   . Esophageal cancer Neg Hx   . Rectal cancer Neg Hx   . Stomach cancer Neg Hx     Social History Social History   Tobacco Use  . Smoking status: Former Smoker    Types: Pipe  . Smokeless tobacco: Never Used  . Tobacco comment: i4/06/2013 "only smoked pipe n college 1958"  Substance Use Topics  . Alcohol use: No    Alcohol/week: 0.0 oz  . Drug use: No    Review of Systems Constitutional: No fever/chills Eyes: No visual changes. ENT: No sore throat. Cardiovascular: Positive for chest pain. Respiratory: Denies shortness of breath. Gastrointestinal: No abdominal pain.  No nausea, no vomiting.  No diarrhea.  No constipation. Genitourinary: Negative for  dysuria. Musculoskeletal: Negative for neck pain.  Negative for back pain. Integumentary: Negative for rash. Neurological: Negative for headaches, focal weakness or numbness.   ____________________________________________   PHYSICAL EXAM:  VITAL SIGNS: ED Triage Vitals [07/31/17 0350]  Enc Vitals Group     BP      Pulse      Resp      Temp 97.8 F (36.6 C)     Temp Source Axillary     SpO2      Weight      Height      Head Circumference      Peak Flow      Pain Score      Pain Loc      Pain Edu?      Excl. in [place]?  Constitutional: Alert and Well appearing and in no acute distress. Eyes: Conjunctivae are normal. PERRL. EOMI. Head: Atraumatic. Mouth/Throat: Mucous membranes are moist. Oropharynx non-erythematous. Neck: No stridor.  No cervical spine tenderness to palpation. Cardiovascular: Normal rate, regular rhythm. Good peripheral circulation. Grossly normal heart sounds. Chest: Pain to palpation right ribs 7 and 8 Respiratory: Normal respiratory effort.  No retractions. Lungs CTAB. Gastrointestinal: Soft and nontender. No distention.  Musculoskeletal: No lower extremity tenderness nor edema. No gross deformities of extremities. Neurologic:  Normal speech and language. No gross focal neurologic deficits are appreciated.  Skin:  Skin is warm, dry and intact. No rash noted. Psychiatric: Mood and affect are normal. Speech and behavior are normal.   EKG    RADIOLOGY I, [place] N Jeremaine Maraj, personally viewed and evaluated these images (plain radiographs) as part of my medical decision making, as well as reviewing the written report by the radiologist.  Dg Ribs Unilateral W/chest Right  Result Date: 07/31/2017 CLINICAL DATA:  Status post unwitnessed fall, with right posterior chest pain. Initial encounter. EXAM: RIGHT RIBS AND CHEST - 3+ VIEW COMPARISON:  Chest radiograph performed 01/07/2017 FINDINGS: There appear to be mildly displaced fractures of the right  lateral seventh through ninth ribs. The lungs are hypoexpanded. Mild vascular crowding and vascular congestion are noted. No pleural effusion or pneumothorax is seen. The cardiomediastinal silhouette is normal in size. Clips are noted within the right upper quadrant, reflecting prior cholecystectomy. IMPRESSION: 1. Mildly displaced fractures of the right lateral seventh through ninth ribs. 2. Lungs hypoexpanded but grossly clear.  Vascular congestion noted. Electronically Signed   By: Garald Balding M.D.   On: 07/31/2017 05:08   Ct Head Wo Contrast  Result Date: 07/31/2017 CLINICAL DATA:  Initial evaluation for acute trauma, fall. EXAM: CT HEAD WITHOUT CONTRAST CT CERVICAL SPINE WITHOUT CONTRAST TECHNIQUE: Multidetector CT imaging of the head and cervical spine was performed following the standard protocol without intravenous contrast. Multiplanar CT image reconstructions of the cervical spine were also generated. COMPARISON:  Prior CT from 07/21/2017. FINDINGS: CT HEAD FINDINGS Brain: Atrophy with chronic small vessel ischemic disease. No acute intracranial hemorrhage. No acute large vessel territory infarct. No mass lesion, midline shift or mass effect. No hydrocephalus. No extra-axial fluid collection. Vascular: No hyperdense vessel. Scattered vascular calcifications noted within the carotid siphons. Skull: Scalp soft tissues and calvarium within normal limits. Sinuses/Orbits: Globes normal soft tissues demonstrate no acute abnormality. Mild scattered mucosal thickening within the ethmoidal air cells. Paranasal sinuses otherwise clear. No mastoid effusion. Other: None. CT CERVICAL SPINE FINDINGS Alignment: Trace anterolisthesis of C4 on C5, C7 on T1, and T1 on T2. Mild straightening of the normal cervical lordosis. Skull base and vertebrae: Skullbase intact.  No acute fracture. Soft tissues and spinal canal: Soft tissues of the neck demonstrate no acute abnormality. No prevertebral edema. Spinal canal within  normal limits. Disc levels: Moderate multilevel degenerative spondylolysis, most notable at C6-7. Upper chest: Visualized upper chest demonstrates no acute abnormality. Visualized lung apices are clear. IMPRESSION: CT BRAIN: 1. No acute intracranial process. 2. Age-related cerebral atrophy with chronic small vessel ischemic disease, stable. CT CERVICAL SPINE: 1. No acute traumatic injury within the cervical spine. 2. Moderate multilevel degenerate spondylolysis, greatest at C6-7. Electronically Signed   By: Jeannine Boga M.D.   On: 07/31/2017 04:59   Ct Cervical Spine Wo Contrast  Result Date: 07/31/2017 CLINICAL DATA:  Initial evaluation for acute trauma, fall. EXAM: CT HEAD WITHOUT CONTRAST CT CERVICAL SPINE WITHOUT CONTRAST TECHNIQUE:  Multidetector CT imaging of the head and cervical spine was performed following the standard protocol without intravenous contrast. Multiplanar CT image reconstructions of the cervical spine were also generated. COMPARISON:  Prior CT from 07/21/2017. FINDINGS: CT HEAD FINDINGS Brain: Atrophy with chronic small vessel ischemic disease. No acute intracranial hemorrhage. No acute large vessel territory infarct. No mass lesion, midline shift or mass effect. No hydrocephalus. No extra-axial fluid collection. Vascular: No hyperdense vessel. Scattered vascular calcifications noted within the carotid siphons. Skull: Scalp soft tissues and calvarium within normal limits. Sinuses/Orbits: Globes normal soft tissues demonstrate no acute abnormality. Mild scattered mucosal thickening within the ethmoidal air cells. Paranasal sinuses otherwise clear. No mastoid effusion. Other: None. CT CERVICAL SPINE FINDINGS Alignment: Trace anterolisthesis of C4 on C5, C7 on T1, and T1 on T2. Mild straightening of the normal cervical lordosis. Skull base and vertebrae: Skullbase intact.  No acute fracture. Soft tissues and spinal canal: Soft tissues of the neck demonstrate no acute abnormality. No  prevertebral edema. Spinal canal within normal limits. Disc levels: Moderate multilevel degenerative spondylolysis, most notable at C6-7. Upper chest: Visualized upper chest demonstrates no acute abnormality. Visualized lung apices are clear. IMPRESSION: CT BRAIN: 1. No acute intracranial process. 2. Age-related cerebral atrophy with chronic small vessel ischemic disease, stable. CT CERVICAL SPINE: 1. No acute traumatic injury within the cervical spine. 2. Moderate multilevel degenerate spondylolysis, greatest at C6-7. Electronically Signed   By: Jeannine Boga M.D.   On: 07/31/2017 04:59     Procedures   ____________________________________________   INITIAL IMPRESSION / ASSESSMENT AND PLAN / ED COURSE  As part of my medical decision making, I reviewed the following data within the electronic MEDICAL RECORD [id number] year old male presenting with above-stated history and physical exam of accidental fall.  Concern for possible rib fracture and as such rib x-rays were performed which revealed a fracture of the seventh eighth and ninth rib on the right.  CT scan of the head and cervical spine revealed no acute pathology.  I spoke with the patient's daughter and brother at length regarding all clinical findings.  Family is agreeable to tramadol for pain and outpatient follow-up. ____________________________________________  FINAL CLINICAL IMPRESSION(S) / ED DIAGNOSES  Final diagnoses:  Closed fracture of multiple ribs of right side, initial encounter     MEDICATIONS GIVEN DURING THIS VISIT:  Medications  traMADol (ULTRAM) tablet 50 mg (not administered)     ED Discharge Orders        Ordered    traMADol (ULTRAM) 50 MG tablet  Every 6 hours PRN     07/31/17 0524       Note:  This document was prepared using Dragon voice recognition software and may include unintentional dictation errors.    Gregor Hams, MD 07/31/17 [phone]

## 2017-07-31 NOTE — ED Notes (Signed)
Pt. Returned to tx. room in stable condition with no acute changes since departure from unit for scans.   

## 2017-07-31 NOTE — ED Notes (Addendum)
This RN called Parke Simmers, Med tech to give report at [facility]. Verbalized understanding of d/c instructions and medications and that family is transporting family back to facility.

## 2017-07-31 NOTE — ED Triage Notes (Addendum)
Pt bib [facility] from [facility] d/t unwitnessed fall when getting out of bed between 0230 and 0330. Pt has dementia at baseline, denies remembering fall when asked. Pt c/o right chest and neck pain, presenting with approx. half inch circular lac to right forehead- bleeding controlled. Report from EMS states facility reports pt is at baseline mentation. VSS in route. No blood thinners in MAR from facility

## 2017-08-02 ENCOUNTER — Ambulatory Visit (INDEPENDENT_AMBULATORY_CARE_PROVIDER_SITE_OTHER): Payer: Medicare Other | Admitting: Neurology

## 2017-08-02 ENCOUNTER — Encounter: Payer: Self-pay | Admitting: Neurology

## 2017-08-02 VITALS — BP 109/73 | HR 69 | Ht 70.0 in | Wt 181.0 lb

## 2017-08-02 DIAGNOSIS — R441 Visual hallucinations: Secondary | ICD-10-CM

## 2017-08-02 DIAGNOSIS — R490 Dysphonia: Secondary | ICD-10-CM

## 2017-08-02 DIAGNOSIS — G3183 Dementia with Lewy bodies: Secondary | ICD-10-CM

## 2017-08-02 DIAGNOSIS — F0281 Dementia in other diseases classified elsewhere with behavioral disturbance: Secondary | ICD-10-CM

## 2017-08-02 DIAGNOSIS — D61818 Other pancytopenia: Secondary | ICD-10-CM | POA: Diagnosis not present

## 2017-08-02 DIAGNOSIS — F02818 Dementia in other diseases classified elsewhere, unspecified severity, with other behavioral disturbance: Secondary | ICD-10-CM

## 2017-08-02 MED ORDER — QUETIAPINE FUMARATE 25 MG PO TABS: ORAL_TABLET | ORAL | 3 refills | Status: DC

## 2017-08-02 NOTE — Progress Notes (Signed)
PATIENT: John Summers DOB: [birth year]  REASON FOR VISIT: follow up- falls, related to syncope or TIA ?  Suspected Lewy Body Dementia-  Dr. Edilia Bo at [facility] at [place].   HISTORY FROM: patient and son  HISTORY OF PRESENT ILLNESS: History from 02 August 2017, Mr. John Summers is seen here today with his step son,  The patient lives at [facility] in [place], [place].  He had recently more frequent falls that were witnessed by some of the staff at the facility.  Their concern is that the patient may have dysautonomic syncope or TIAs, the patient underwent a CT head without contrast on 21 July 2017 right after 1 of his falls that was attributed to syncope.  There were no acute strokes but moderate brain atrophy and small vessel ischemic changes of the white matter is noted.  Also noted was covered at all to be calcification.  A caretaker at his facility had noticed that the patient slumped over while sitting in a chair and he did not fall out of the chair. These spells last less than 30 seconds and he is confused, disoriented afterwards.  PBA- He has begun crying uncontrollably- "emotionally incontinent ". He continues to see people invisible to others. He sees children in his room.    Interval history from 04/19/2017. Mr. John Summers is 71,  seen today in the presence of his daughter, a Equities trader. He lost his wife in March 2018, moved to an assisted living facility after the death, had to go to a rehabilitation facility and when released could not stay in assisted living. So he has meanwhile lived at [facility] care unit.  His memory overall is stable, he again scored 17 out of 30 points on the Mini-Mental Status Examination. Of concern are his nocturnal hallucinations- for the most part visual hallucinations. He states to me today that he has met all his friends yesterday night again, he has the Parkinsonian appearance with a reduced facial expression extremely slow  gait, he relies on a walker he occasionally drools on the right side only, and his pseudobulbar affect has progressed. He is easily tearful. He is anxious, he is angry at times, yelling at imaginary people. He is less verbal. We need to address depression, sleep and hallucinations. Possibly use Seroquel- he could not tolerate CLONAZEPAM for REM BD.    John Summers is seen 03-04-2016  in the presence of his wife, he is in physical therapy for his parkinsonian appearance and symptoms. His movements have slowed. He still and perhaps even increasing he has visual hallucinations but no auditory component results. People have moved into his home after the election and have not left. They're not talking and there are silently residing.  His movements have slowed he is slightly hunched and he is more rigid. He has increasingly frequently fallen. It is difficult for him to rise from a seated position and all this has been addressed with physical therapy in the hope of stalling with disease progression. He has started to fall backwards. His stance is not as steady and he seems to sway. His head this often dropped to his chest and he is looking at the floor while he walks. He cannot dress steadily, he is so extremely slow. His dysphonia is unchanged. Reduced facial expression, "dropped "open mouth. Appetite has remained strong, sleepiness is stronger but his wife is also reported that he seems to gurgle, and drool. He is also diaphoretic at night. His sleep cannot  be evaluated in the lab, he has pseudobulbar affect, gets suddently tearful. PBA. He has no sign of PSP- Lewy body dementia is in my differential.     (MM) John Summers is an 83 year old male with a history of Lewy body dementia. He returns today for follow-up. The patient remains on Aricept 10 mg daily. He also takes Klonopin half a tablet at bedtime. The patient feels that his memory has gotten slightly worse. He requires assistance with ADLs such as  dressing. He does not operate a motor vehicle. The patient does have trouble with his balance and is working with physical therapy currently. He does not use an assistive device when ambulating. The patient does have some anxiety. When he becomes frustrated his tremor worsens and he will need to take deep breaths. These episodes only last for several seconds.  The patient continues to have hallucinations. His hallucinations are not violent or fearful. He states that he will see people in his home. He states it looks as if they're having conversation but he cannot hear them. He continues to take Klonopin at bedtime. His wife states that he sleeps very well at bedtime and patient agrees. Patient denies any changes with his appetite. He denies any new neurological symptoms. He returns today for an evaluation.  HISTORY 05/01/15: John Summers is an 83 year old male with a history of possible Lewy body dementia. He returns today for an evaluation. The patient is on Aricept 10 mg daily. He is tolerating this medication well. The patient also takes Klonopin 1/2 tablet at bedtime for REM sleep behavior disorder. At the last visit the patient was asked to start melatonin 5 mg in conjunction to the Klonopin. Wife reports that she could not remember the name of this medication and therefore they did not start it. The patient feels that his memory has gotten worse. He is able to complete all ADLs independently. He reports he does have trouble dressing but this is due to his balance. The patient has had 2 falls since the last visit. The patient does not operate a motor vehicle. His wife now completes all the finances. He does report visual hallucinations. His hallucinations primarily consist of people. He reports that the hallucinations are not violent or fearful. They normally take place during the day rarely at night. The wife feels that his sleep is slightly better. He has been more calmer when he sleeps. The wife feels that  he may be depressed. She states that he typically he has no motivation to do most things. She has encouraged him to complete basic needs such as bathing. Patient states that he does not want to start any medication for this.  REVIEW OF SYSTEMS: Out of a complete 14 system review of symptoms, the patient complains only of the following symptoms, and all other reviewed systems are negative.  Visual hallucinations, body rigidity, stiffness, tendency to fall forward or backward, unsteadiness, trouble with saliva in the second half of the night sometimes drooling. Pseudobulbar affect, affect incontinence, symptoms confusing the names of his grandchildren.  ALLERGIES: Allergies  Allergen Reactions  . Aspirin   . Chicken Allergy Nausea And Vomiting    Unknown   . Other Nausea And Vomiting    [place] per daughter     HOME MEDICATIONS: Outpatient Medications Prior to Visit  Medication Sig Dispense Refill  . acetaminophen (TYLENOL) 325 MG tablet Take 650 mg by mouth 3 (three) times daily.    Marland Kitchen ALPRAZolam (XANAX) 0.5 MG tablet Take 1  tablet (0.5 mg total) by mouth at bedtime as needed for anxiety. (Patient not taking: Reported on 07/02/2017) 30 tablet 0  . donepezil (ARICEPT) 10 MG tablet take 1 tablet by mouth once daily 30 tablet 5  . FLUoxetine (PROZAC) 20 MG tablet Take 2 tablets (40 mg total) by mouth daily. 2 tab of 20 mg once a day. 60 tablet 5  . hydrOXYzine (ATARAX/VISTARIL) 10 MG tablet take 1 tablet by mouth at bedtime 90 tablet 0  . Melatonin 5 MG TABS Take 1 tablet (5 mg total) by mouth at bedtime. 60 tablet 5  . QUEtiapine (SEROQUEL) 25 MG tablet Take 0.5 tablets (12.5 mg total) by mouth at bedtime. (Patient not taking: Reported on 07/02/2017) 15 tablet 5  . QUEtiapine (SEROQUEL) 25 MG tablet Take 1 tablet (25 mg total) by mouth at bedtime. 30 tablet 0  . spironolactone (ALDACTONE) 25 MG tablet Take 25 mg by mouth daily.    Marland Kitchen sulfamethoxazole-trimethoprim (BACTRIM DS,SEPTRA DS) 800-160  MG tablet Take 1 tablet by mouth 2 (two) times daily.    . traMADol (ULTRAM) 50 MG tablet Take 50 mg by mouth 3 (three) times daily as needed (pain).    . traMADol (ULTRAM) 50 MG tablet Take 1 tablet (50 mg total) every 6 (six) hours as needed by mouth. 20 tablet 0  . Vitamin D, Ergocalciferol, (DRISDOL) 50000 units CAPS capsule Take 50,000 Units by mouth every 7 (seven) days.    Marland Kitchen MELATONIN PO Take 1 tablet by mouth at bedtime.     Facility-Administered Medications Prior to Visit  Medication Dose Route Frequency Provider Last Rate Last Dose  . calcium-vitamin D (OSCAL WITH D) 500-200 MG-UNIT per tablet 1 tablet  1 tablet Oral Q breakfast Ngetich, Dinah C, NP        PAST MEDICAL HISTORY: Past Medical History:  Diagnosis Date  . Colon adenocarcinoma ([place])   . Complication of anesthesia    severe confusion  . GERD (gastroesophageal reflux disease)   . History of colon polyps   . History of gallstones   . Hx of transfusion of platelets   . Hypertension   . ITP (idiopathic thrombocytopenic purpura)    Dr. Grayland Ormond follows  . Kidney stones   . Lewy body dementia with behavioral disturbance   . Major depressive disorder, recurrent episode, in partial remission with anxious distress ([place])   . Pancreatitis 2/11  . Pollen allergies   . Skin cancer    ears  . Sleep apnea    "won't wear mask anymore" (12/22/2012)  . Unspecified essential hypertension     PAST SURGICAL HISTORY: Past Surgical History:  Procedure Laterality Date  . CARDIAC CATHETERIZATION  2000's   "tx'd w/acid reflux RX" (12/22/2012)  . CHOLECYSTECTOMY  2011  . ESOPHAGEAL DILATION  1990's?  Marland Kitchen HERNIA REPAIR  12/22/2012   IHR w/mesh  . HERNIA REPAIR INCISIONAL N/A 12/22/2012   Performed by Harl Bowie, MD at [facility]  . INGUINAL HERNIA REPAIR Left [id number]  . INSERTION OF MESH N/A 12/22/2012   Performed by Harl Bowie, MD at [facility]  . LAPAROSCOPIC RIGHT COLECTOMY Right 03/21/2012   Performed by Harl Bowie[facility] at [facility] OR  . LITHOTRIPSY  2008   x2  . PARTIAL COLECTOMY Right 03/21/2012   Performed by Harl Bowie, MD at [facility]  . PILONIDAL CYST EXCISION  1960's?  Marland Kitchen SKIN CANCER EXCISION     "face; ?left ear" (12/22/2012)  . TONSILLECTOMY AND  ADENOIDECTOMY  1946  . TOTAL HIP ARTHROPLASTY Left ~ 2002  . UPPER GASTROINTESTINAL ENDOSCOPY      FAMILY HISTORY: Family History  Problem Relation Age of Onset  . Colon cancer Mother   . Heart disease Mother   . Hypertension Mother   . Alcohol abuse Father   . Esophageal cancer Neg Hx   . Rectal cancer Neg Hx   . Stomach cancer Neg Hx     SOCIAL HISTORY: Social History   Socioeconomic History  . Marital status: Widowed    Spouse name: John Summers  . Number of children: 2  . Years of education: college  . Highest education level: Not on file  Social Needs  . Financial resource strain: Not on file  . Food insecurity - worry: Not on file  . Food insecurity - inability: Not on file  . Transportation needs - medical: Not on file  . Transportation needs - non-medical: Not on file  Occupational History    Employer: RETIRED    Comment: Retired, Production manager, Investment banker, corporate  Tobacco Use  . Smoking status: Former Smoker    Types: Pipe  . Smokeless tobacco: Never Used  . Tobacco comment: i4/06/2013 "only smoked pipe n college 1958"  Substance and Sexual Activity  . Alcohol use: No    Alcohol/week: 0.0 oz  . Drug use: No  . Sexual activity: No  Other Topics Concern  . Not on file  Social History Narrative   Patient is widowed John Summers) and lives at [place]   Patient has two children and his wife has two children..   Patient is retired, Office manager   Patient has a Financial risk analyst.   Patient is right-handed.   Patient drinks 1-2 cups of coffee daily.      PHYSICAL EXAM  Vitals:   08/02/17 0917  BP: 109/73  Pulse: 69  Weight: 181 lb (82.1 kg)  Height: _0  (1.778 m)   Body mass index is 25.97 kg/m.    MMSE - Mini Mental State Exam 04/19/2017 06/04/2016 03/04/2016  Orientation to time _1 Orientation to Place _2 Registration _3 Attention/ Calculation 0 0 1  Recall 0 0 1  Language- name 2 objects _4 Language- repeat 1 1 0  Language- follow 3 step command _5 Language- read & follow direction _6 Write a sentence _7 Copy design 0 1 0  Total score _8 Generalized: Well developed, in no acute distress   Neurological examination  Mentation: Alert.. Follows all commands speech and language fluent Cranial nerve  Pupils were equal round reactive to light. Extraocular movements were full, there is nystagmus with gaze to the left. Horizontal and vertical eye movements are otherwise intact, there is no sign of progressive supranuclear palsy.  Facial sensation  Normal. He tends to have his mouth drop open and he drools at night,  Uvula is in midline as is tongue midline. Head turning and shoulder shrug  were normal and symmetric. His chin drops to his chest. His posture is hunched. There is more rigidity over his motor system throughout.  He also presents with a pseudobulbar affect - easily is tearful. Motor: The motor testing reveals increased tone bilaterally-   Intermittent resting tremor in both hands Gait and station:  Patient has a  shuffling gait. He does require assistance with standing.  He cannot  stand up from a seated position without my assistance. He now presented in a wheelchair - a progression from 5 month ago.  Tandem gait not attempted. Decreased armswing bilaterally. He is stooped, he shuffles. He has propulsive falls.  Reflexes: Deep tendon reflexes are brisk, symmetric bilaterally.   DIAGNOSTIC DATA (LABS, IMAGING, TESTING) - I reviewed patient records, labs, notes, testing and imaging myself where available.  Lab Results  Component Value Date   WBC 3.0 (L) 07/21/2017   HGB 11.4 (L) 07/21/2017   HCT 34.3 (L) 07/21/2017   MCV 104.1 (H)  07/21/2017   PLT 96 (L) 07/21/2017    ASSESSMENT AND PLAN 83 y.o. year old male patient with parkinsonian gait, possible dysautonomia, syncope and elevated liver enzymes, pancreatic cysts.   Increased falls related to parkinsonism, propulsive and PBA.  1. Dementia with Lewy bodies, hallucinations beginning in the late afternoon.  2. PBA- irrational crying.  3. Parkinson gait changes. High fall risk.   Formed visual hallucinations.  However , he has verbalized aggression. He is afraid he had hit a member of the community.   I ordered Seroquel at 25 at night plus melatonin.   [name] memory by  MMSE today is  MMSE - Mini Mental State Exam 04/19/2017 06/04/2016 03/04/2016  Orientation to time _0 Orientation to Place _1 Registration _2 Attention/ Calculation 0 0 1  Recall 0 0 1  Language- name 2 objects _3 Language- repeat 1 1 0  Language- follow 3 step command _4 Language- read & follow direction _5 Write a sentence _6 Copy design 0 1 0  Total score _7 He will continue on Aricept 10 mg daily.   He reported people silently living " in his house"- now he lives in a memory care and "confused people "are invading his space. He sees also childrenn, in spite of Seroquel. 25 mg. Seroquel can affect pancreas and may supress bone marrow. I advised son in law that I see no alternative for treatment of his hallucinations.  Needs to keep up hydration. May not need antiplatelets given his low count pancytopenia.  I will hold Seroquel for 30 days to see if his hallucinations stay stabile- and if his Blood Cell Count will recover. I will ask your facility to hold Seroquel for at least 14 days to see if you have still hallucinations, and if you can tolerate the d/c of the medication, should repeat CBC and Diff in 30 days.   PT , gait training. At the facility and perhaps in a group.  I cannot make recommendations to his pancreatic or anemia work up- he is  scheduled for echocardiogram with cardiology. Monitor for 48 hours was last week.     He will follow-up in 4 months with NP .   Larey Seat, MD   08/02/2017, 9:20 AM [facility] [address] [place], [place] [address]   [phone]   CC Hematologist Dr Dartha Lodge, [facility]

## 2017-08-02 NOTE — Patient Instructions (Signed)
Quetiapine tablets  I will ask your facility to hold Seroquel for at least 14 days to see if you have still hallucinations, and if you can tolerate the d/c of the medication, should repeat CBC and Diff in 30 days.   What is this medicine? QUETIAPINE (kwe TYE a peen) is an antipsychotic. It is used to treat schizophrenia and bipolar disorder, also known as manic-depression. This medicine may be used for other purposes; ask your health care provider or pharmacist if you have questions. COMMON BRAND NAME(S): Seroquel What should I tell my health care provider before I take this medicine? They need to know if you have any of these conditions: -brain tumor or head injury -breast cancer -cataracts -diabetes -difficulty swallowing -heart disease -kidney disease -liver disease -low blood counts, like low white cell, platelet, or red cell counts -low blood pressure or dizziness when standing up -Parkinson's disease -previous heart attack -seizures -suicidal thoughts, plans, or attempt by you or a family member -thyroid disease -an unusual or allergic reaction to quetiapine, other medicines, foods, dyes, or preservatives -pregnant or trying to get pregnant -breast-feeding How should I use this medicine? Take this medicine by mouth. Swallow it with a drink of water. Follow the directions on the prescription label. If it upsets your stomach you can take it with food. Take your medicine at regular intervals. Do not take it more often than directed. Do not stop taking except on the advice of your doctor or health care professional. A special MedGuide will be given to you by the pharmacist with each prescription and refill. Be sure to read this information carefully each time. Talk to your pediatrician regarding the use of this medicine in children. While this drug may be prescribed for children as young as 10 years for selected conditions, precautions do apply. Patients over age 68 years may have a  stronger reaction to this medicine and need smaller doses. Overdosage: If you think you have taken too much of this medicine contact a poison control center or emergency room at once. NOTE: This medicine is only for you. Do not share this medicine with others. What if I miss a dose? If you miss a dose, take it as soon as you can. If it is almost time for your next dose, take only that dose. Do not take double or extra doses. What may interact with this medicine? Do not take this medicine with any of the following medications: -certain medicines for fungal infections like fluconazole, itraconazole, ketoconazole, posaconazole, voriconazole -cisapride -dofetilide -dronedarone -droperidol -grepafloxacin -halofantrine -phenothiazines like chlorpromazine, mesoridazine, thioridazine -pimozide -sparfloxacin -ziprasidone This medicine may also interact with the following medications: -alcohol -antiviral medicines for HIV or AIDS -certain medicines for blood pressure -certain medicines for depression, anxiety, or psychotic disturbances like haloperidol, lorazepam -certain medicines for diabetes -certain medicines for Parkinson's disease -certain medicines for seizures like carbamazepine, phenobarbital, phenytoin -cimetidine -erythromycin -other medicines that prolong the QT interval (cause an abnormal heart rhythm) -rifampin -steroid medicines like prednisone or cortisone This list may not describe all possible interactions. Give your health care provider a list of all the medicines, herbs, non-prescription drugs, or dietary supplements you use. Also tell them if you smoke, drink alcohol, or use illegal drugs. Some items may interact with your medicine. What should I watch for while using this medicine? Visit your doctor or health care professional for regular checks on your progress. It may be several weeks before you see the full effects of this medicine. Your health  care provider may  suggest that you have your eyes examined prior to starting this medicine, and every 6 months thereafter. If you have been taking this medicine regularly for some time, do not suddenly stop taking it. You must gradually reduce the dose or your symptoms may get worse. Ask your doctor or health care professional for advice. Patients and their families should watch out for worsening depression or thoughts of suicide. Also watch out for sudden or severe changes in feelings such as feeling anxious, agitated, panicky, irritable, hostile, aggressive, impulsive, severely restless, overly excited and hyperactive, or not being able to sleep. If this happens, especially at the beginning of antidepressant treatment or after a change in dose, call your health care professional. Dennis Bast may get dizzy or drowsy. Do not drive, use machinery, or do anything that needs mental alertness until you know how this medicine affects you. Do not stand or sit up quickly, especially if you are an older patient. This reduces the risk of dizzy or fainting spells. Alcohol can increase dizziness and drowsiness. Avoid alcoholic drinks. Do not treat yourself for colds, diarrhea or allergies. Ask your doctor or health care professional for advice, some ingredients may increase possible side effects. This medicine can reduce the response of your body to heat or cold. Dress warm in cold weather and stay hydrated in hot weather. If possible, avoid extreme temperatures like saunas, hot tubs, very hot or cold showers, or activities that can cause dehydration such as vigorous exercise. What side effects may I notice from receiving this medicine? Side effects that you should report to your doctor or health care professional as soon as possible: -allergic reactions like skin rash, itching or hives, swelling of the face, lips, or tongue -difficulty swallowing -fast or irregular heartbeat -fever or chills, sore throat -fever with rash, swollen lymph  nodes, or swelling of the face -increased hunger or thirst -increased urination -problems with balance, talking, walking -seizures -stiff muscles -suicidal thoughts or other mood changes -uncontrollable head, mouth, neck, arm, or leg movements -unusually weak or tired Side effects that usually do not require medical attention (report to your doctor or health care professional if they continue or are bothersome): -change in sex drive or performance -constipation -drowsy or dizzy -dry mouth -stomach upset -weight gain This list may not describe all possible side effects. Call your doctor for medical advice about side effects. You may report side effects to FDA at 1-800-FDA-1088. Where should I keep my medicine? Keep out of the reach of children. Store at room temperature between 15 and 30 degrees C (59 and 86 degrees F). Throw away any unused medicine after the expiration date. NOTE: This sheet is a summary. It may not cover all possible information. If you have questions about this medicine, talk to your doctor, pharmacist, or health care provider.  2018 Elsevier/Gold Standard (2015-03-05 13:07:35)

## 2017-08-14 ENCOUNTER — Emergency Department: Payer: Medicare Other

## 2017-08-14 ENCOUNTER — Emergency Department
Admission: EM | Admit: 2017-08-14 | Discharge: 2017-08-15 | Disposition: A | Discharge status: Home / Self Care | Payer: Medicare Other | Attending: Emergency Medicine | Admitting: Emergency Medicine

## 2017-08-14 DIAGNOSIS — F0390 Unspecified dementia without behavioral disturbance: Secondary | ICD-10-CM | POA: Diagnosis not present

## 2017-08-14 DIAGNOSIS — Y92129 Unspecified place in nursing home as the place of occurrence of the external cause: Secondary | ICD-10-CM | POA: Insufficient documentation

## 2017-08-14 DIAGNOSIS — I10 Essential (primary) hypertension: Secondary | ICD-10-CM | POA: Insufficient documentation

## 2017-08-14 DIAGNOSIS — Z85828 Personal history of other malignant neoplasm of skin: Secondary | ICD-10-CM | POA: Diagnosis not present

## 2017-08-14 DIAGNOSIS — S2241XD Multiple fractures of ribs, right side, subsequent encounter for fracture with routine healing: Secondary | ICD-10-CM | POA: Diagnosis not present

## 2017-08-14 DIAGNOSIS — Z87891 Personal history of nicotine dependence: Secondary | ICD-10-CM | POA: Diagnosis not present

## 2017-08-14 DIAGNOSIS — W1830XA Fall on same level, unspecified, initial encounter: Secondary | ICD-10-CM | POA: Diagnosis not present

## 2017-08-14 DIAGNOSIS — S40021A Contusion of right upper arm, initial encounter: Secondary | ICD-10-CM | POA: Diagnosis not present

## 2017-08-14 DIAGNOSIS — Y9389 Activity, other specified: Secondary | ICD-10-CM | POA: Diagnosis not present

## 2017-08-14 DIAGNOSIS — S4981XA Other specified injuries of right shoulder and upper arm, initial encounter: Secondary | ICD-10-CM | POA: Diagnosis present

## 2017-08-14 DIAGNOSIS — Z96642 Presence of left artificial hip joint: Secondary | ICD-10-CM | POA: Diagnosis not present

## 2017-08-14 DIAGNOSIS — Y999 Unspecified external cause status: Secondary | ICD-10-CM | POA: Insufficient documentation

## 2017-08-14 DIAGNOSIS — Z79899 Other long term (current) drug therapy: Secondary | ICD-10-CM | POA: Diagnosis not present

## 2017-08-14 NOTE — ED Triage Notes (Addendum)
Pt presents today via [facility] from [facility]. Pt's daughter states that staff told her"that a mini fridge on top of him." Pt is was seen here 2 wks ago for same thing. Pt was dx with 3 broke ribs on the right side that last visit.Pt complains of pain in the left side (rib) and the upper right arm. Awaiting EDP vs Stable.

## 2017-08-14 NOTE — ED Provider Notes (Signed)
[facility] Emergency Department Provider Note  ____________________________________________  Time seen: Approximately 10:27 PM  I have reviewed the triage vital signs and the nursing notes.   HISTORY  Chief Complaint Chest Pain  Level 5 Caveat: Portions of the History and Physical are unable to be obtained due to patient being a poor historian due to chronic dementia   HPI John Summers is a 83 y.o. male brought to the ED due to right upper arm pain and left chest wall pain after a fall today. Patient doesn't remember what happened but apparently earlier this morning he pulled a mini fridge off of a counter top and fell down. Denies headache or head injury or neck pain. No loss of consciousness. He had a similar fall 2 weeks ago where he was found to have 3 broken ribs on the right side. Patient denies shortness of breath.     Past Medical History:  Diagnosis Date  . Colon adenocarcinoma ([place])   . Complication of anesthesia    severe confusion  . GERD (gastroesophageal reflux disease)   . History of colon polyps   . History of gallstones   . Hx of transfusion of platelets   . Hypertension   . ITP (idiopathic thrombocytopenic purpura)    Dr. Grayland Ormond follows  . Kidney stones   . Lewy body dementia with behavioral disturbance   . Major depressive disorder, recurrent episode, in partial remission with anxious distress ([place])   . Pancreatitis 2/11  . Pollen allergies   . Skin cancer    ears  . Sleep apnea    "won't wear mask anymore" (12/22/2012)  . Unspecified essential hypertension      Patient Active Problem List   Diagnosis Date Noted  . 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 Pancytopenia, acquired ([place]) 08/02/2017  . Visual hallucination 08/02/2017  . Generalized osteoarthritis 03/05/2017  . AF (paroxysmal atrial fibrillation) ([place]) 02/01/2017  . Near syncope 01/06/2017  . Gait disturbance 12/30/2016  . MGUS (monoclonal  gammopathy of unknown significance) 05/10/2016  . Pancreatic mass 04/24/2016  . Thrombocytopenia ([place]) 10/04/2015  . Uncontrolled REM sleep behavior disorder 11/16/2014  . Lewy body dementia with behavioral disturbance 11/16/2014  . Tremor of right hand 11/16/2014  . Major depressive disorder, recurrent episode, in partial remission with anxious distress ([place])   . REM sleep behavior disorder 12/07/2013  . Incisional hernia, without obstruction or gangrene 11/14/2012  . Adenocarcinoma of cecum ([place]) 03/19/2012  . GERD (gastroesophageal reflux disease) 03/19/2012  . Sleep apnea   . Essential hypertension      Past Surgical History:  Procedure Laterality Date  . CARDIAC CATHETERIZATION  2000's   "tx'd w/acid reflux RX" (12/22/2012)  . CHOLECYSTECTOMY  2011  . ESOPHAGEAL DILATION  1990's?  Marland Kitchen HERNIA REPAIR  12/22/2012   IHR w/mesh  . INGUINAL HERNIA REPAIR Left [id number]  . INSERTION OF MESH N/A 12/22/2012   Procedure: INSERTION OF MESH;  Surgeon: Harl Bowie, MD;  Location: [place];  Service: General;  Laterality: N/A;  . LITHOTRIPSY  2008   x2  . PARTIAL COLECTOMY  03/21/2012   Procedure: PARTIAL COLECTOMY;  Surgeon: Harl Bowie, MD;  Location: [place];  Service: General;  Laterality: Right;  . PILONIDAL CYST EXCISION  1960's?  Marland Kitchen SKIN CANCER EXCISION     "face; ?left ear" (12/22/2012)  . TONSILLECTOMY AND ADENOIDECTOMY  1946  . TOTAL HIP ARTHROPLASTY Left ~ 2002  . UPPER GASTROINTESTINAL ENDOSCOPY    . VENTRAL  HERNIA REPAIR N/A 12/22/2012   Procedure: HERNIA REPAIR INCISIONAL ;  Surgeon: Harl Bowie, MD;  Location: [place];  Service: General;  Laterality: N/A;     Prior to Admission medications   Medication Sig Start Date End Date Taking? Authorizing Provider  acetaminophen (TYLENOL) 325 MG tablet Take 650 mg by mouth 3 (three) times daily.    [provider]  ALPRAZolam Duanne Moron) 0.5 MG tablet Take 1 tablet (0.5 mg total) by mouth at bedtime as needed for  anxiety. Patient not taking: Reported on 07/02/2017 02/02/17   Henreitta Leber, MD  donepezil (ARICEPT) 10 MG tablet take 1 tablet by mouth once daily 11/09/16   Dohmeier, Asencion Partridge, MD  FLUoxetine (PROZAC) 20 MG tablet Take 2 tablets (40 mg total) by mouth daily. 2 tab of 20 mg once a day. 04/19/17   Dohmeier, Asencion Partridge, MD  hydrOXYzine (ATARAX/VISTARIL) 10 MG tablet take 1 tablet by mouth at bedtime 12/07/16   Dohmeier, Asencion Partridge, MD  Melatonin 5 MG TABS Take 1 tablet (5 mg total) by mouth at bedtime. 04/19/17   Dohmeier, Asencion Partridge, MD  QUEtiapine (SEROQUEL) 25 MG tablet Will place Seroquel on hold- for 14 days - if patient has recurrent Hallucinations, re-instate the medication. 08/02/17   Dohmeier, Asencion Partridge, MD  spironolactone (ALDACTONE) 25 MG tablet Take 25 mg by mouth daily.    [provider]  sulfamethoxazole-trimethoprim (BACTRIM DS,SEPTRA DS) 800-160 MG tablet Take 1 tablet by mouth 2 (two) times daily.    [provider]  traMADol (ULTRAM) 50 MG tablet Take 50 mg by mouth 3 (three) times daily as needed (pain).    [provider]  traMADol (ULTRAM) 50 MG tablet Take 1 tablet (50 mg total) every 6 (six) hours as needed by mouth. 07/31/17 07/31/18  Gregor Hams, MD  Vitamin D, Ergocalciferol, (DRISDOL) 50000 units CAPS capsule Take 50,000 Units by mouth every 7 (seven) days.    [provider]     Allergies Aspirin; Chicken allergy; and Other   Family History  Problem Relation Age of Onset  . Colon cancer Mother   . Heart disease Mother   . Hypertension Mother   . Alcohol abuse Father   . Esophageal cancer Neg Hx   . Rectal cancer Neg Hx   . Stomach cancer Neg Hx     Social History Social History   Tobacco Use  . Smoking status: Former Smoker    Types: Pipe  . Smokeless tobacco: Never Used  . Tobacco comment: i4/06/2013 "only smoked pipe n college 1958"  Substance Use Topics  . Alcohol use: No    Alcohol/week: 0.0 oz  . Drug use: No    Review  of Systems  Constitutional:   No fever or chills.  ENT:   No sore throat. No rhinorrhea. Cardiovascular:   No chest pain or syncope. Respiratory:   No dyspnea or cough. Gastrointestinal:   Negative for abdominal pain, vomiting and diarrhea.  Musculoskeletal:   Positive right upper arm pain. Left chest pain. All other systems reviewed and are negative except as documented above in ROS and HPI.  ____________________________________________   PHYSICAL EXAM:  VITAL SIGNS: ED Triage Vitals  Enc Vitals Group     BP 08/14/17 2001 121/71     Pulse Rate 08/14/17 2001 67     Resp 08/14/17 2001 16     Temp 08/14/17 2001 97.7 F (36.5 C)     Temp Source 08/14/17 2001 Oral     SpO2 08/14/17  2001 100 %     Weight 08/14/17 2104 181 lb (82.1 kg)     Height 08/14/17 2104 5\' 10"  (1.778 m)     Head Circumference --      Peak Flow --      Pain Score --      Pain Loc --      Pain Edu? --      Excl. in [place]? --     Vital signs reviewed, nursing assessments reviewed.   Constitutional:   Alert and oriented to self. Well appearing and in no distress. Eyes:   No scleral icterus.  EOMI. No nystagmus. No conjunctival pallor. PERRL. ENT   Head:   Normocephalic and atraumatic.   Nose:   No congestion/rhinnorhea. No epistaxis   Mouth/Throat:   MMM, no pharyngeal erythema. No peritonsillar mass.    Neck:   No meningismus. Full ROM. No midline tenderness. Hematological/Lymphatic/Immunilogical:   No cervical lymphadenopathy. Cardiovascular:   RRR. Symmetric bilateral radial and DP pulses.  No murmurs.  Respiratory:   Normal respiratory effort without tachypnea/retractions. Breath sounds are clear and equal bilaterally. No wheezes/rales/rhonchi. Gastrointestinal:   Soft and nontender. Non distended. There is no CVA tenderness.  No rebound, rigidity, or guarding. Genitourinary:   deferred Musculoskeletal:   Normal range of motion in all extremities. No joint effusions.  No lower extremity  tenderness.  Right proximal humerus tenderness without deformity. Left inferolateral chest wall tenderness over the false ribs without crepitus or step-offs.  Neurologic:   Normal speech and language.  Motor grossly intact. No gross focal neurologic deficits are appreciated.  Skin:    Skin is warm, dry and intact. Small amount of bruising over the right proximal humerus..  ____________________________________________    LABS (pertinent positives/negatives) (all labs ordered are listed, but only abnormal results are displayed) Labs Reviewed - No data to display ____________________________________________   EKG    ____________________________________________    RADIOLOGY  Dg Chest Portable 1 View  Result Date: 08/14/2017 CLINICAL DATA:  Fall EXAM: PORTABLE CHEST 1 VIEW COMPARISON:  07/31/2017 FINDINGS: Low lung volumes. Minimal atelectasis at the left lung base. No consolidation or effusion. Stable cardiomediastinal silhouette. No pneumothorax. Possible new acute right sixth rib fracture. Re- demonstrated right seventh and eighth rib fractures. IMPRESSION: 1. Negative for pneumothorax or pleural effusion 2. Possible new acute right sixth rib fracture. Re- demonstrated right seventh and eighth rib fractures Electronically Signed   By: Donavan Foil M.D.   On: 08/14/2017 21:01   Dg Humerus Right  Result Date: 08/14/2017 CLINICAL DATA:  Right upper arm pain after a fall EXAM: RIGHT HUMERUS - 2+ VIEW COMPARISON:  02/01/2017 FINDINGS: Moderate AC joint degenerative change. No fracture or dislocation is seen. IMPRESSION: No acute osseous abnormality Electronically Signed   By: Donavan Foil M.D.   On: 08/14/2017 21:00    ____________________________________________   PROCEDURES Procedures  ____________________________________________     CLINICAL IMPRESSION / ASSESSMENT AND PLAN / ED COURSE  Pertinent labs & imaging results that were available during my care of the patient  were reviewed by me and considered in my medical decision making (see chart for details).   Patient well-appearing no acute distress unremarkable vital signs brought to the ED for evaluation after a mechanical fall today. Exam and symptoms do not reveal any concerns for serious trauma, but x-ray was performed to evaluate for a humerus fracture versus rib fractures. X-rays unremarkable, no acute findings. Redemonstration of old rib fractures. I viewed the x-rays myself  and agree with these reports. Results discussed with the patient and family, they were comfortable taking him home and continuing to follow up with his primary care doctor. They will remove the fridge from his room      ____________________________________________   FINAL CLINICAL IMPRESSION(S) / ED DIAGNOSES    Final diagnoses:  Contusion of right upper arm, initial encounter  Closed fracture of multiple ribs of right side with routine healing, subsequent encounter      This SmartLink is deprecated. Use AVSMEDLIST instead to display the medication list for a patient.   Portions of this note were generated with dragon dictation software. Dictation errors may occur despite best attempts at proofreading.    Carrie Mew, MD 08/14/17 2239

## 2017-08-27 ENCOUNTER — Telehealth: Payer: Self-pay | Admitting: Neurology

## 2017-08-27 NOTE — Telephone Encounter (Signed)
Pt's daughter calling said the episodes and falls have decreased since stopping Seroquel but he is getting grumpy, ill and resistant in the evenings worse this week. She is aware Dr Brett Fairy is out of the office today and will return on Monday, this can wait until then. She did say she will give him atarax in the evenings starting today until she hears advise from Dr D.

## 2017-08-30 ENCOUNTER — Other Ambulatory Visit: Payer: Self-pay | Admitting: Neurology

## 2017-08-30 MED ORDER — QUETIAPINE FUMARATE 25 MG PO TABS: 25.0000 mg | ORAL_TABLET | Freq: Every day | ORAL | 10 refills | Status: DC

## 2017-08-30 NOTE — Telephone Encounter (Signed)
Called the patient to make her aware the Dr Brett Fairy will order a low dose Ativan that can be helpful at bedtime with agitation. I have called, no answer. LVM for the patient's daughter to call back to discuss

## 2017-08-30 NOTE — Telephone Encounter (Signed)
0.5 ativan at bedtime per DR Dohmeier prn agitation

## 2017-08-30 NOTE — Telephone Encounter (Signed)
Patients daughter states she is slightly leary of the ativan. The daughter stated they have increased his sitter coverage. The patienit is getting sitter coverage 12 hours out of the day. She states that the patient is having also increased hallucinations and also increased depression. She is asking is it worth trying and going back to the seroquel at this time and see if this helps and if it doesn't or they feel the falls are worsening once again we can stop and try the ativan at that time. She states that he was on other medications that could have also been contributing to the falls when he was on the seroquel before. I informed her that I would ask Dr Brett Fairy and see what she recommends.

## 2017-09-01 ENCOUNTER — Ambulatory Visit: Payer: Self-pay | Admitting: Neurology

## 2017-09-16 ENCOUNTER — Other Ambulatory Visit: Payer: Self-pay | Admitting: Family Medicine

## 2017-09-17 ENCOUNTER — Emergency Department
Admission: EM | Admit: 2017-09-17 | Discharge: 2017-09-17 | Disposition: A | Discharge status: Home / Self Care | Payer: Medicare Other | Attending: Emergency Medicine | Admitting: Emergency Medicine

## 2017-09-17 ENCOUNTER — Encounter: Payer: Self-pay | Admitting: *Deleted

## 2017-09-17 ENCOUNTER — Emergency Department: Payer: Medicare Other

## 2017-09-17 ENCOUNTER — Other Ambulatory Visit: Payer: Self-pay

## 2017-09-17 DIAGNOSIS — E860 Dehydration: Secondary | ICD-10-CM | POA: Diagnosis not present

## 2017-09-17 DIAGNOSIS — N3090 Cystitis, unspecified without hematuria: Secondary | ICD-10-CM | POA: Diagnosis not present

## 2017-09-17 DIAGNOSIS — Z79899 Other long term (current) drug therapy: Secondary | ICD-10-CM | POA: Insufficient documentation

## 2017-09-17 DIAGNOSIS — Z87891 Personal history of nicotine dependence: Secondary | ICD-10-CM | POA: Insufficient documentation

## 2017-09-17 DIAGNOSIS — F0390 Unspecified dementia without behavioral disturbance: Secondary | ICD-10-CM | POA: Insufficient documentation

## 2017-09-17 DIAGNOSIS — Z85828 Personal history of other malignant neoplasm of skin: Secondary | ICD-10-CM | POA: Insufficient documentation

## 2017-09-17 DIAGNOSIS — I10 Essential (primary) hypertension: Secondary | ICD-10-CM | POA: Diagnosis not present

## 2017-09-17 DIAGNOSIS — R509 Fever, unspecified: Secondary | ICD-10-CM | POA: Diagnosis present

## 2017-09-17 LAB — BASIC METABOLIC PANEL
ANION GAP: 9 (ref 5–15)
BUN: 20 mg/dL (ref 6–20)
CALCIUM: 8.2 mg/dL — AB (ref 8.9–10.3)
CHLORIDE: 105 mmol/L (ref 101–111)
CO2: 25 mmol/L (ref 22–32)
Creatinine, Ser: 0.91 mg/dL (ref 0.61–1.24)
GFR calc non Af Amer: >60 mL/min (ref >60)
Glucose, Bld: 109 mg/dL — ABNORMAL HIGH (ref 65–99)
Potassium: 3.8 mmol/L (ref 3.5–5.1)
Sodium: 139 mmol/L (ref 135–145)

## 2017-09-17 LAB — CBC
HCT: 24.3 % — ABNORMAL LOW (ref 40.0–52.0)
HEMOGLOBIN: 8.2 g/dL — AB (ref 13.0–18.0)
MCH: 36 pg — AB (ref 26.0–34.0)
MCHC: 33.6 g/dL (ref 32.0–36.0)
MCV: 107.1 fL — ABNORMAL HIGH (ref 80.0–100.0)
Platelets: 75 10*3/uL — ABNORMAL LOW (ref 150–440)
RBC: 2.27 MIL/uL — AB (ref 4.40–5.90)
RDW: 15.4 % — ABNORMAL HIGH (ref 11.5–14.5)
WBC: 5 10*3/uL (ref 3.8–10.6)

## 2017-09-17 LAB — INFLUENZA PANEL BY PCR (TYPE A & B)
INFLBPCR: NEGATIVE
Influenza A By PCR: NEGATIVE

## 2017-09-17 LAB — LACTIC ACID, PLASMA: LACTIC ACID, VENOUS: 1.3 mmol/L (ref 0.5–1.9)

## 2017-09-17 LAB — TROPONIN I: TROPONIN I: 0.04 ng/mL — AB (ref <0.03)

## 2017-09-17 MED ORDER — DEXTROSE 5 % IV SOLN
1.0000 g | Freq: Once | INTRAVENOUS | Status: DC
  Filled 2017-09-17: qty 10

## 2017-09-17 MED ORDER — SODIUM CHLORIDE 0.9 % IV BOLUS (SEPSIS)
1000.0000 mL | Freq: Once | INTRAVENOUS | Status: AC
  Administered 2017-09-17: 1000 mL via INTRAVENOUS

## 2017-09-17 MED ORDER — CEFTRIAXONE SODIUM IN DEXTROSE 20 MG/ML IV SOLN
INTRAVENOUS | Status: AC
  Administered 2017-09-17: 1 g
  Filled 2017-09-17: qty 50

## 2017-09-17 NOTE — ED Provider Notes (Signed)
[facility] Emergency Department Provider Note  ____________________________________________  Time seen: Approximately 5:12 PM  I have reviewed the triage vital signs and the nursing notes.   HISTORY  Chief Complaint Recurrent UTI  Level 5 Caveat: Portions of the History and Physical were unable to be obtained due to altered mental status.   HPI John Summers is a 84 y.o. male with a history of dementia brought to the ED due to fever. He was diagnosed outpatient with a UTI and prescribed in about X that have not yet been started. He is found to have a low-grade fever today and family reports that he is less communicative than normal and not eating. They also note that he had a fall 2 days ago and was evaluated by mobile x-ray but did not come to the Hospital or seek care otherwise at that time.     Past Medical History:  Diagnosis Date  . Colon adenocarcinoma ([place])   . Complication of anesthesia    severe confusion  . GERD (gastroesophageal reflux disease)   . History of colon polyps   . History of gallstones   . Hx of transfusion of platelets   . Hypertension   . ITP (idiopathic thrombocytopenic purpura)    Dr. Grayland Ormond follows  . Kidney stones   . Lewy body dementia with behavioral disturbance   . Major depressive disorder, recurrent episode, in partial remission with anxious distress ([place])   . Pancreatitis 2/11  . Pollen allergies   . Skin cancer    ears  . Sleep apnea    "won't wear mask anymore" (12/22/2012)  . Unspecified essential hypertension      Patient Active Problem List   Diagnosis Date Noted  . 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 Pancytopenia, acquired ([place]) 08/02/2017  . Visual hallucination 08/02/2017  . Generalized osteoarthritis 03/05/2017  . AF (paroxysmal atrial fibrillation) ([place]) 02/01/2017  . Near syncope 01/06/2017  . Gait disturbance 12/30/2016  . MGUS (monoclonal gammopathy of unknown  significance) 05/10/2016  . Pancreatic mass 04/24/2016  . Thrombocytopenia ([place]) 10/04/2015  . Uncontrolled REM sleep behavior disorder 11/16/2014  . Lewy body dementia with behavioral disturbance 11/16/2014  . Tremor of right hand 11/16/2014  . Major depressive disorder, recurrent episode, in partial remission with anxious distress ([place])   . REM sleep behavior disorder 12/07/2013  . Incisional hernia, without obstruction or gangrene 11/14/2012  . Adenocarcinoma of cecum ([place]) 03/19/2012  . GERD (gastroesophageal reflux disease) 03/19/2012  . Sleep apnea   . Essential hypertension      Past Surgical History:  Procedure Laterality Date  . CARDIAC CATHETERIZATION  2000's   "tx'd w/acid reflux RX" (12/22/2012)  . CHOLECYSTECTOMY  2011  . ESOPHAGEAL DILATION  1990's?  Marland Kitchen HERNIA REPAIR  12/22/2012   IHR w/mesh  . INGUINAL HERNIA REPAIR Left [id number]  . INSERTION OF MESH N/A 12/22/2012   Procedure: INSERTION OF MESH;  Surgeon: Harl Bowie, MD;  Location: [place];  Service: General;  Laterality: N/A;  . LITHOTRIPSY  2008   x2  . PARTIAL COLECTOMY  03/21/2012   Procedure: PARTIAL COLECTOMY;  Surgeon: Harl Bowie, MD;  Location: [place];  Service: General;  Laterality: Right;  . PILONIDAL CYST EXCISION  1960's?  Marland Kitchen SKIN CANCER EXCISION     "face; ?left ear" (12/22/2012)  . TONSILLECTOMY AND ADENOIDECTOMY  1946  . TOTAL HIP ARTHROPLASTY Left ~ 2002  . UPPER GASTROINTESTINAL ENDOSCOPY    . VENTRAL HERNIA REPAIR N/A  12/22/2012   Procedure: HERNIA REPAIR INCISIONAL ;  Surgeon: Harl Bowie, MD;  Location: [place];  Service: General;  Laterality: N/A;     Prior to Admission medications   Medication Sig Start Date End Date Taking? Authorizing Provider  acetaminophen (TYLENOL) 325 MG tablet Take 650 mg by mouth 3 (three) times daily.    [provider]  ALPRAZolam Duanne Moron) 0.5 MG tablet Take 1 tablet (0.5 mg total) by mouth at bedtime as needed for anxiety. Patient not  taking: Reported on 07/02/2017 02/02/17   Henreitta Leber, MD  donepezil (ARICEPT) 10 MG tablet take 1 tablet by mouth once daily 11/09/16   Dohmeier, Asencion Partridge, MD  FLUoxetine (PROZAC) 20 MG tablet Take 2 tablets (40 mg total) by mouth daily. 2 tab of 20 mg once a day. 04/19/17   Dohmeier, Asencion Partridge, MD  hydrOXYzine (ATARAX/VISTARIL) 10 MG tablet take 1 tablet by mouth at bedtime 12/07/16   Dohmeier, Asencion Partridge, MD  Melatonin 5 MG TABS Take 1 tablet (5 mg total) by mouth at bedtime. 04/19/17   Dohmeier, Asencion Partridge, MD  QUEtiapine (SEROQUEL) 25 MG tablet Take 1 tablet (25 mg total) by mouth at bedtime. 08/30/17   Dohmeier, Asencion Partridge, MD  spironolactone (ALDACTONE) 25 MG tablet Take 25 mg by mouth daily.    [provider]  sulfamethoxazole-trimethoprim (BACTRIM DS,SEPTRA DS) 800-160 MG tablet Take 1 tablet by mouth 2 (two) times daily.    [provider]  traMADol (ULTRAM) 50 MG tablet Take 50 mg by mouth 3 (three) times daily as needed (pain).    [provider]  traMADol (ULTRAM) 50 MG tablet Take 1 tablet (50 mg total) every 6 (six) hours as needed by mouth. 07/31/17 07/31/18  Gregor Hams, MD  Vitamin D, Ergocalciferol, (DRISDOL) 50000 units CAPS capsule Take 50,000 Units by mouth every 7 (seven) days.    [provider]     Allergies Aspirin; Chicken allergy; and Other   Family History  Problem Relation Age of Onset  . Colon cancer Mother   . Heart disease Mother   . Hypertension Mother   . Alcohol abuse Father   . Esophageal cancer Neg Hx   . Rectal cancer Neg Hx   . Stomach cancer Neg Hx     Social History Social History   Tobacco Use  . Smoking status: Former Smoker    Types: Pipe  . Smokeless tobacco: Never Used  . Tobacco comment: i4/06/2013 "only smoked pipe n college 1958"  Substance Use Topics  . Alcohol use: No    Alcohol/week: 0.0 oz  . Drug use: No    Review of Systems Unable to reliably obtained due to altered mental status and chronic  dementia Positive fever Positive cough Decreased oral intake ____________________________________________   PHYSICAL EXAM:  VITAL SIGNS: ED Triage Vitals [09/17/17 1458]  Enc Vitals Group     BP 137/66     Pulse Rate 76     Resp 18     Temp (!) 100.5 F (38.1 C)     Temp Source Oral     SpO2 94 %     Weight      Height      Head Circumference      Peak Flow      Pain Score      Pain Loc      Pain Edu?      Excl. in [place]?     Vital signs reviewed, nursing assessments reviewed.   Constitutional:  Awake and alert, oriented to self. Chronically ill-appearing, not in distress Eyes:   No scleral icterus.  EOMI. No nystagmus. No conjunctival pallor. PERRL. ENT   Head:   Normocephalic and atraumatic.   Nose:   No congestion/rhinnorhea.    Mouth/Throat:   Dry mucous membranes, no pharyngeal erythema. No peritonsillar mass.    Neck:   No meningismus. Full ROM. No midline spinal tenderness Hematological/Lymphatic/Immunilogical:   No cervical lymphadenopathy. Cardiovascular:   RRR. Symmetric bilateral radial and DP pulses.  No murmurs.  Respiratory:   Normal respiratory effort without tachypnea/retractions. Crackles in left lower lung  Gastrointestinal:   Soft and nontender. Non distended. There is no CVA tenderness.  No rebound, rigidity, or guarding. Genitourinary:   deferred Musculoskeletal:   Normal range of motion in all extremities. No joint effusions.  No lower extremity tenderness.  No edema. No midline spinal tenderness Neurologic:   Mumbling speech, unintelligible.  Motor grossly intact.  Skin:    Skin is warm, dry and intact. No rash noted.  No petechiae, purpura, or bullae.  ____________________________________________    LABS (pertinent positives/negatives) (all labs ordered are listed, but only abnormal results are displayed) Labs Reviewed  BASIC METABOLIC PANEL - Abnormal; Notable for the following components:      Result Value   Glucose, Bld  109 (*)    Calcium 8.2 (*)    All other components within normal limits  CBC - Abnormal; Notable for the following components:   RBC 2.27 (*)    Hemoglobin 8.2 (*)    HCT 24.3 (*)    MCV 107.1 (*)    MCH 36.0 (*)    RDW 15.4 (*)    Platelets 75 (*)    All other components within normal limits  TROPONIN I - Abnormal; Notable for the following components:   Troponin I 0.04 (*)    All other components within normal limits  LACTIC ACID, PLASMA  INFLUENZA PANEL BY PCR (TYPE A & B)  CBG MONITORING, ED   ____________________________________________   EKG    ____________________________________________    RADIOLOGY  Dg Chest Portable 1 View  Result Date: 09/17/2017 CLINICAL DATA:  84 y/o M; known urinary tract infection. 2-3 days of cough. Fall 2 days ago. EXAM: PORTABLE CHEST 1 VIEW COMPARISON:  08/14/2017 chest radiograph. FINDINGS: Normal cardiac silhouette. Aortic atherosclerosis with calcification. Mild reticular markings of the lungs. No consolidation. No pleural effusion or pneumothorax. No acute osseous abnormality is evident. Right upper quadrant surgical clips, presumably cholecystectomy. IMPRESSION: Mild reticular markings may represent interstitial edema or bronchitic changes. No consolidation. Electronically Signed   By: Kristine Garbe M.D.   On: 09/17/2017 16:41   Dg Hip Unilat W Or Wo Pelvis 2-3 Views Left  Result Date: 09/17/2017 CLINICAL DATA:  84 y/o  M; left hip pain after fall. EXAM: DG HIP (WITH OR WITHOUT PELVIS) 2-3V LEFT COMPARISON:  07/02/2017 left hip radiographs. FINDINGS: No acute fracture or pelvic diastases identified. Total left hip prosthesis is well seated. No periprosthetic lucency or fracture identified. IMPRESSION: No acute fracture or dislocation identified. Total left hip prosthesis without apparent hardware related complication. Electronically Signed   By: Kristine Garbe M.D.   On: 09/17/2017 16:43     ____________________________________________   PROCEDURES Procedures  ____________________________________________    CLINICAL IMPRESSION / ASSESSMENT AND PLAN / ED COURSE  Pertinent labs & imaging results that were available during my care of the patient were reviewed by me and considered in my medical decision making (  see chart for details).   Patient presents with low-grade fever, somewhat altered level of consciousness. Vital signs are otherwise unremarkable, patient does not meet criteria for sepsis protocol, but screening lactate will be obtained while he give IV fluids, repeat hip x-ray, chest x-ray, check labs and reassess.  Clinical Course as of Sep 17 1956  Fri Sep 17, 2017  1955 Results reviewed, all unremarkable. Trop at baseline. Flu neg. D/w family at bedside, agree with pt DC back to SNF. Has overnight sitter to assist as well. Bactrim rx has been filled and is available to start at [facility]. Will DC home. Pt much improved after ivf.   [PS]    Clinical Course User Index [PS] Carrie Mew, MD     ____________________________________________   FINAL CLINICAL IMPRESSION(S) / ED DIAGNOSES    Final diagnoses:  Dehydration  Cystitis       Portions of this note were generated with dragon dictation software. Dictation errors may occur despite best attempts at proofreading.      Carrie Mew, MD 09/17/17 1958

## 2017-09-17 NOTE — ED Triage Notes (Signed)
PT to ED from [facility] with a known UTI. Pt was prescribed antibiotics today but EMS reports pt has not started medication. Family is reporting pt is dehydrated and needs to be evaluated for fever. Pt presents with oral temp of 100.5.   Pt is altered at baseline. PT was reported to have had a fall two days ago. Details of fall are unknown but pt is tender to pressure applied to left hip and yelled out in pain when RN was removing pts left pant leg. No deformity noted. No shortening or rotation at this time.

## 2017-09-17 NOTE — Discharge Instructions (Signed)
Please start taking your Bactrim for the urinary tract infection as soon as possible (tonight) and continue taking it as prescribed.

## 2017-09-17 NOTE — ED Notes (Signed)
Disregard medical necessity entered earlier, patient's daughter decided to try and get him home herself.

## 2017-09-18 LAB — URINE CULTURE

## 2017-09-18 LAB — MICROSCOPIC EXAMINATION: Epithelial Cells (non renal): NONE SEEN /hpf (ref 0–10)

## 2017-09-18 LAB — URINALYSIS, ROUTINE W REFLEX MICROSCOPIC
Bilirubin, UA: NEGATIVE
GLUCOSE, UA: NEGATIVE
Ketones, UA: NEGATIVE
Leukocytes, UA: NEGATIVE
NITRITE UA: POSITIVE — AB
PH UA: 5 (ref 5.0–7.5)
RBC UA: NEGATIVE
Specific Gravity, UA: >=1.03 — AB (ref 1.005–1.030)
Urobilinogen, Ur: 0.2 mg/dL (ref 0.2–1.0)

## 2017-10-20 ENCOUNTER — Encounter: Payer: Self-pay | Admitting: Adult Health

## 2017-10-20 ENCOUNTER — Ambulatory Visit (INDEPENDENT_AMBULATORY_CARE_PROVIDER_SITE_OTHER): Payer: Medicare Other | Admitting: Adult Health

## 2017-10-20 VITALS — BP 130/80 | HR 75 | Ht 70.0 in | Wt 166.4 lb

## 2017-10-20 DIAGNOSIS — G3183 Dementia with Lewy bodies: Secondary | ICD-10-CM | POA: Diagnosis not present

## 2017-10-20 DIAGNOSIS — F0281 Dementia in other diseases classified elsewhere with behavioral disturbance: Secondary | ICD-10-CM

## 2017-10-20 DIAGNOSIS — R441 Visual hallucinations: Secondary | ICD-10-CM | POA: Diagnosis not present

## 2017-10-20 DIAGNOSIS — R451 Restlessness and agitation: Secondary | ICD-10-CM | POA: Diagnosis not present

## 2017-10-20 MED ORDER — MEMANTINE HCL 5 MG PO TABS: 5.0000 mg | ORAL_TABLET | Freq: Two times a day (BID) | ORAL | 5 refills | Status: DC

## 2017-10-20 NOTE — Patient Instructions (Signed)
-memory score has declined slightly  - continue aricept 10mg   -start nemenda 5mg  twice a day; call after 1 month to increase dose  -continue seroquel 25mg  at night   Memantine Tablets What is this medicine? MEMANTINE (MEM an teen) is used to treat dementia caused by Alzheimer's disease. This medicine may be used for other purposes; ask your health care provider or pharmacist if you have questions. COMMON BRAND NAME(S): Namenda What should I tell my health care provider before I take this medicine? They need to know if you have any of these conditions: -difficulty passing urine -kidney disease -liver disease -seizures -an unusual or allergic reaction to memantine, other medicines, foods, dyes, or preservatives -pregnant or trying to get pregnant -breast-feeding How should I use this medicine? Take this medicine by mouth with a glass of water. Follow the directions on the prescription label. You may take this medicine with or without food. Take your doses at regular intervals. Do not take your medicine more often than directed. Continue to take your medicine even if you feel better. Do not stop taking except on the advice of your doctor or health care professional. Talk to your pediatrician regarding the use of this medicine in children. Special care may be needed. Overdosage: If you think you have taken too much of this medicine contact a poison control center or emergency room at once. NOTE: This medicine is only for you. Do not share this medicine with others. What if I miss a dose? If you miss a dose, take it as soon as you can. If it is almost time for your next dose, take only that dose. Do not take double or extra doses. If you do not take your medicine for several days, contact your health care provider. Your dose may need to be changed. What may interact with this  medicine? -acetazolamide -amantadine -cimetidine -dextromethorphan -dofetilide -hydrochlorothiazide -ketamine -metformin -methazolamide -quinidine -ranitidine -sodium bicarbonate -triamterene This list may not describe all possible interactions. Give your health care provider a list of all the medicines, herbs, non-prescription drugs, or dietary supplements you use. Also tell them if you smoke, drink alcohol, or use illegal drugs. Some items may interact with your medicine. What should I watch for while using this medicine? Visit your doctor or health care professional for regular checks on your progress. Check with your doctor or health care professional if there is no improvement in your symptoms or if they get worse. You may get drowsy or dizzy. Do not drive, use machinery, or do anything that needs mental alertness until you know how this drug affects you. Do not stand or sit up quickly, especially if you are an older patient. This reduces the risk of dizzy or fainting spells. Alcohol can make you more drowsy and dizzy. Avoid alcoholic drinks. What side effects may I notice from receiving this medicine? Side effects that you should report to your doctor or health care professional as soon as possible: -allergic reactions like skin rash, itching or hives, swelling of the face, lips, or tongue -agitation or a feeling of restlessness -depressed mood -dizziness -hallucinations -redness, blistering, peeling or loosening of the skin, including inside the mouth -seizures -vomiting Side effects that usually do not require medical attention (report to your doctor or health care professional if they continue or are bothersome): -constipation -diarrhea -headache -nausea -trouble sleeping This list may not describe all possible side effects. Call your doctor for medical advice about side effects. You may report side effects to  FDA at 1-800-FDA-1088. Where should I keep my medicine? Keep  out of the reach of children. Store at room temperature between 15 degrees and 30 degrees C (59 degrees and 86 degrees F). Throw away any unused medicine after the expiration date. NOTE: This sheet is a summary. It may not cover all possible information. If you have questions about this medicine, talk to your doctor, pharmacist, or health care provider.  2018 Elsevier/Gold Standard (2013-06-19 14:10:42)

## 2017-10-20 NOTE — Progress Notes (Signed)
[facility] [address] [place]. [place] [address] [phone]       OFFICE FOLLOW-UP NOTE  Mr. John Summers Date of Birth:  1933/05/12 Medical Record Number:  [id number]   Reason for Visit: follow up Lewy body dementia  CHIEF COMPLAINT:  Chief Complaint  Patient presents with  . Follow-up  . Dementia    Resides at [facility] in [place], [place]    HPI: John Summers is being seen today in the office for Lewy body Dementia. History obtained from patient, daughter and chart review. Reviewed all radiology images and labs personally. Hallucinations are still present but have been better since last visit. Agitation has bene worse per daughter. MMSE on 04/2017 19. MMSE today 12. Pt fell 3 weeks ago and diagnosed with UTI x2. Pt currently using w/c and working with PT in assisted living. Pt having a difficult time ambulating as his feet "keep crossing" per daughter. Pt had 3x episodes of upper extremity shaking and crying during visit. Daughter says he does this when he is upset. Daughter also states patient is not happy and she feels as though he is depressed.    Interval History 08/02/17 John Summers is seen here today with his step son,  The patient lives at [facility] in [place], [place].  He had recently more frequent falls that were witnessed by some of the staff at the facility.  Their concern is that the patient may have dysautonomic syncope or TIAs, the patient underwent a CT head without contrast on 21 July 2017 right after 1 of his falls that was attributed to syncope.  There were no acute strokes but moderate brain atrophy and small vessel ischemic changes of the white matter is noted.  Also noted was covered at all to be calcification.  A caretaker at his facility had noticed that the patient slumped over while sitting in a chair and he did not fall out of the chair. These spells last less than 30 seconds and he is confused, disoriented afterwards.   PBA- He has begun crying uncontrollably- "emotionally incontinent ". He continues to see people invisible to others. He sees children in his room.  Interval history 04/19/2017. John Summers is 84,   seen today in the presence of his daughter, a Equities trader. He lost his wife in March 2018, moved to an assisted living facility after the death, had to go to a rehabilitation facility and when released could not stay in assisted living. So he has meanwhile lived at [facility] care [facility].  His memory overall is stable, he again scored 17 out of 30 points on the Mini-Mental Status Examination. Of concern are his nocturnal hallucinations- for the most part visual hallucinations. He states to me today that he has met all his friends yesterday night again, he has the Parkinsonian appearance with a reduced facial expression extremely slow gait, he relies on a walker he occasionally drools on the right side only, and his pseudobulbar affect has progressed. He is easily tearful. He is anxious, he is angry at times, yelling at imaginary people. He is less verbal. We need to address depression, sleep and hallucinations. Possibly use Seroquel- he could not tolerate CLONAZEPAM for REM BD.  Interval History 03-04-2016  in the presence of his wife, he is in physical therapy for his parkinsonian appearance and symptoms. His movements have slowed. He still and perhaps even increasing he has visual hallucinations but no auditory component results. People have moved into his home  after the election and have not left. They're not talking and there are silently residing.  His movements have slowed he is slightly hunched and he is more rigid. He has increasingly frequently fallen. It is difficult for him to rise from a seated position and all this has been addressed with physical therapy in the hope of stalling with disease progression. He has started to fall backwards. His stance is not as steady and he seems to sway. His head  this often dropped to his chest and he is looking at the floor while he walks. He cannot dress steadily, he is so extremely slow. His dysphonia is unchanged. Reduced facial expression, "dropped "open mouth. Appetite has remained strong, sleepiness is stronger but his wife is also reported that he seems to gurgle, and drool. He is also diaphoretic at night. His sleep cannot be evaluated in the lab, he has pseudobulbar affect, gets suddently tearful. PBA. He has no sign of PSP- Lewy body dementia is in my differential.  Interval History (MM) John Summers is an 84 year old male with a history of Lewy body dementia. He returns today for follow-up. The patient remains on Aricept 10 mg daily. He also takes Klonopin half a tablet at bedtime. The patient feels that his memory has gotten slightly worse. He requires assistance with ADLs such as dressing. He does not operate a motor vehicle. The patient does have trouble with his balance and is working with physical therapy currently. He does not use an assistive device when ambulating. The patient does have some anxiety. When he becomes frustrated his tremor worsens and he will need to take deep breaths. These episodes only last for several seconds.  The patient continues to have hallucinations. His hallucinations are not violent or fearful. He states that he will see people in his home. He states it looks as if they're having conversation but he cannot hear them. He continues to take Klonopin at bedtime. His wife states that he sleeps very well at bedtime and patient agrees. Patient denies any changes with his appetite. He denies any new neurological symptoms. He returns today for an evaluation. HISTORY 05/01/15: John Summers is an 84 year old male with a history of possible Lewy body dementia. He returns today for an evaluation. The patient is on Aricept 10 mg daily. He is tolerating this medication well. The patient also takes Klonopin 1/2 tablet at bedtime for REM sleep  behavior disorder. At the last visit the patient was asked to start melatonin 5 mg in conjunction to the Klonopin. Wife reports that she could not remember the name of this medication and therefore they did not start it. The patient feels that his memory has gotten worse. He is able to complete all ADLs independently. He reports he does have trouble dressing but this is due to his balance. The patient has had 2 falls since the last visit. The patient does not operate a motor vehicle. His wife now completes all the finances. He does report visual hallucinations. His hallucinations primarily consist of people. He reports that the hallucinations are not violent or fearful. They normally take place during the day rarely at night. The wife feels that his sleep is slightly better. He has been more calmer when he sleeps. The wife feels that he may be depressed. She states that he typically he has no motivation to do most things. She has encouraged him to complete basic needs such as bathing. Patient states that he does not want to start any medication  for this.   ROS:   14 system review of systems performed and negative with exception of appetite change (20lb weight loss since previous visit), leg swelling, diarrhea, sleep talking, food allergies, aching muscles, walking difficulty, tremors, agitation, depression, nervous/anxious.   PMH:  Past Medical History:  Diagnosis Date  . Colon adenocarcinoma ([place])   . Complication of anesthesia    severe confusion  . GERD (gastroesophageal reflux disease)   . History of colon polyps   . History of gallstones   . Hx of transfusion of platelets   . Hypertension   . ITP (idiopathic thrombocytopenic purpura)    Dr. Grayland Ormond follows  . Kidney stones   . Lewy body dementia with behavioral disturbance   . Major depressive disorder, recurrent episode, in partial remission with anxious distress ([place])   . Pancreatitis 2/11  . Pollen allergies   . Skin cancer    ears    . Sleep apnea    "won't wear mask anymore" (12/22/2012)  . Unspecified essential hypertension     PSH:  Past Surgical History:  Procedure Laterality Date  . CARDIAC CATHETERIZATION  2000's   "tx'd w/acid reflux RX" (12/22/2012)  . CHOLECYSTECTOMY  2011  . ESOPHAGEAL DILATION  1990's?  Marland Kitchen HERNIA REPAIR  12/22/2012   IHR w/mesh  . INGUINAL HERNIA REPAIR Left [id number]  . INSERTION OF MESH N/A 12/22/2012   Procedure: INSERTION OF MESH;  Surgeon: Harl Bowie, MD;  Location: [facility];  Service: General;  Laterality: N/A;  . LITHOTRIPSY  2008   x2  . PARTIAL COLECTOMY  03/21/2012   Procedure: PARTIAL COLECTOMY;  Surgeon: Harl Bowie, MD;  Location: [facility];  Service: General;  Laterality: Right;  . PILONIDAL CYST EXCISION  1960's?  Marland Kitchen SKIN CANCER EXCISION     "face; ?left ear" (12/22/2012)  . TONSILLECTOMY AND ADENOIDECTOMY  1946  . TOTAL HIP ARTHROPLASTY Left ~ 2002  . UPPER GASTROINTESTINAL ENDOSCOPY    . VENTRAL HERNIA REPAIR N/A 12/22/2012   Procedure: HERNIA REPAIR INCISIONAL ;  Surgeon: Harl Bowie, MD;  Location: [facility];  Service: General;  Laterality: N/A;    Social History:  Social History   Socioeconomic History  . Marital status: Widowed    Spouse name: Charlett Nose  . Number of children: 2  . Years of education: college  . Highest education level: Not on file  Social Needs  . Financial resource strain: Not on file  . Food insecurity - worry: Not on file  . Food insecurity - inability: Not on file  . Transportation needs - medical: Not on file  . Transportation needs - non-medical: Not on file  Occupational History    Employer: RETIRED    Comment: Retired, Production manager, Investment banker, corporate  Tobacco Use  . Smoking status: Former Smoker    Types: Pipe  . Smokeless tobacco: Never Used  . Tobacco comment: i4/06/2013 "only smoked pipe n college 1958"  Substance and Sexual Activity  . Alcohol use: No    Alcohol/week: 0.0 oz  . Drug use: No  . Sexual  activity: No  Other Topics Concern  . Not on file  Social History Narrative   Patient is widowed Charlett Nose) and lives at [place]   Patient has two children and his wife has two children..   Patient is retired, Office manager   Patient has a Financial risk analyst.   Patient is right-handed.   Patient drinks 1-2 cups of coffee daily.    Family History:  Family History  Problem Relation Age of Onset  . Colon cancer Mother   . Heart disease Mother   . Hypertension Mother   . Alcohol abuse Father   . Esophageal cancer Neg Hx   . Rectal cancer Neg Hx   . Stomach cancer Neg Hx     Medications:   Current Outpatient Medications on File Prior to Visit  Medication Sig Dispense Refill  . acetaminophen (TYLENOL) 325 MG tablet Take 650 mg by mouth 3 (three) times daily.    Marland Kitchen donepezil (ARICEPT) 10 MG tablet take 1 tablet by mouth once daily 30 tablet 5  . FLUoxetine (PROZAC) 20 MG tablet Take 2 tablets (40 mg total) by mouth daily. 2 tab of 20 mg once a day. 60 tablet 5  . hydrOXYzine (ATARAX/VISTARIL) 10 MG tablet take 1 tablet by mouth at bedtime 90 tablet 0  . Melatonin 5 MG TABS Take 1 tablet (5 mg total) by mouth at bedtime. 60 tablet 5  . QUEtiapine (SEROQUEL) 25 MG tablet Take 1 tablet (25 mg total) by mouth at bedtime. 30 tablet 10  . sulfamethoxazole-trimethoprim (BACTRIM DS,SEPTRA DS) 800-160 MG tablet Take 1 tablet by mouth 2 (two) times daily.    . traMADol (ULTRAM) 50 MG tablet Take 1 tablet (50 mg total) every 6 (six) hours as needed by mouth. 20 tablet 0  . ALPRAZolam (XANAX) 0.5 MG tablet Take 1 tablet (0.5 mg total) by mouth at bedtime as needed for anxiety. (Patient not taking: Reported on 07/02/2017) 30 tablet 0  . Vitamin D, Ergocalciferol, (DRISDOL) 50000 units CAPS capsule Take 50,000 Units by mouth every 7 (seven) days.     Current Facility-Administered Medications on File Prior to Visit  Medication Dose Route Frequency Provider Last Rate Last Dose  . calcium-vitamin D  (OSCAL WITH D) 500-200 MG-UNIT per tablet 1 tablet  1 tablet Oral Q breakfast Ngetich, Dinah C, NP        Allergies:   Allergies  Allergen Reactions  . Aspirin Other (See Comments)  . Chicken Allergy Nausea And Vomiting    Unknown   . Other Nausea And Vomiting    [place] per daughter     Physical Exam  Vitals:   10/20/17 1456  BP: 130/80  Pulse: 75  Weight: 166 lb 6.4 oz (75.5 kg)  Height: _0  (1.778 m)   Body mass index is 23.88 kg/m. No exam data present  General: elderly caucasian male, well nourished, seated in w/c, in no evident distress Head: head normocephalic and atraumatic.   Neck: supple with no carotid or supraclavicular bruits Cardiovascular: regular rate and rhythm, no murmurs Musculoskeletal: no deformity Skin:  no rash/petichiae Vascular:  Normal pulses all extremities  Neurologic Exam Mental Status: Awake and fully alert. Mood and affect fluctuates from being agitated to joking.  MMSE - Mini Mental State Exam 10/20/2017 04/19/2017 06/04/2016  Orientation to time 0 4 1  Orientation to Place _1 Registration _2 Attention/ Calculation 2 0 0  Recall 0 0 0  Language- name 2 objects _3 Language- repeat 0 1 1  Language- follow 3 step command _4 Language- read & follow direction _5 Write a sentence 0 1 1  Copy design 0 0 1  Total score _6 Cranial Nerves: Pupils equal, briskly reactive to light.  Hearing loss bilaterally. Facial sensation intact. Face, tongue, palate moves normally and symmetrically.  Motor:  Normal bulk and tone. Normal strength in all tested extremity muscles. Sensory.: unable to test d/t uncooperative with testing Coordination: mild decrease in alternating movements with tremor in all extremities. Finger-to-nose and heel-to-shin performed but with moderate tremor.  Gait and Station: Gait not tested d/t patient being in w/c after fall 3 weeks ago and difficulty with ambulation Reflexes: 2+ and symmetric. Toes  downgoing.    Diagnostic Data (Labs, Imaging, Testing) Recent Results (from the past 2160 hour(s))  Urinalysis, Routine w reflex microscopic     Status: Abnormal   Collection Time: 09/16/17  3:11 PM  Result Value Ref Range   Specific Gravity, UA      >=1.030 (A) 1.005 - 1.030   pH, UA 5.0 5.0 - 7.5   Color, UA Yellow Yellow   Appearance Ur Cloudy (A) Clear   Leukocytes, UA Negative Negative   Protein, UA 1+ (A) Negative/Trace   Glucose, UA Negative Negative   Ketones, UA Negative Negative   RBC, UA Negative Negative   Bilirubin, UA Negative Negative   Urobilinogen, Ur 0.2 0.2 - 1.0 mg/dL   Nitrite, UA Positive (A) Negative   Microscopic Examination See below:     Comment: Microscopic was indicated and was performed.  Microscopic Examination     Status: Abnormal   Collection Time: 09/16/17  3:11 PM  Result Value Ref Range   WBC, UA 0-5 0 - 5 /hpf   RBC, UA 0-2 0 - 2 /hpf   Epithelial Cells (non renal) None seen 0 - 10 /hpf   Casts Present (A) None seen /lpf   Cast Type Hyaline casts N/A   Mucus, UA Present Not Estab.   Bacteria, UA Few None seen/Few  Urine Culture     Status: None   Collection Time: 09/16/17  3:11 PM  Result Value Ref Range   Urine Culture, Routine Final report    Organism ID, Bacteria Comment     Comment: Mixed urogenital flora 25,000-50,000 colony forming units per mL   Basic metabolic panel     Status: Abnormal   Collection Time: 09/17/17  3:44 PM  Result Value Ref Range   Sodium 139 135 - 145 mmol/L   Potassium 3.8 3.5 - 5.1 mmol/L   Chloride 105 101 - 111 mmol/L   CO2 25 22 - 32 mmol/L   Glucose, Bld 109 (H) 65 - 99 mg/dL   BUN 20 6 - 20 mg/dL   Creatinine, Ser 0.91 0.61 - 1.24 mg/dL   Calcium 8.2 (L) 8.9 - 10.3 mg/dL   GFR calc non Af Amer >60 >60 mL/min   GFR calc Af Amer >60 >60 mL/min    Comment: (NOTE) The eGFR has been calculated using the CKD EPI equation. This calculation has not been validated in all clinical situations. eGFR's  persistently <60 mL/min signify possible Chronic Kidney Disease.    Anion gap 9 5 - 15    Comment: Performed at [facility], [address] [place], [place] [address]  CBC     Status: Abnormal   Collection Time: 09/17/17  3:44 PM  Result Value Ref Range   WBC 5.0 3.8 - 10.6 K/uL   RBC 2.27 (L) 4.40 - 5.90 MIL/uL   Hemoglobin 8.2 (L) 13.0 - 18.0 g/dL   HCT 24.3 (L) 40.0 - 52.0 %   MCV 107.1 (H) 80.0 - 100.0 fL   MCH 36.0 (H) 26.0 - 34.0 pg   MCHC 33.6 32.0 - 36.0 g/dL   RDW 15.4 (  H) 11.5 - 14.5 %   Platelets 75 (L) 150 - 440 K/uL    Comment: Performed at [facility], [address] [place], [place] [address]  Troponin I     Status: Abnormal   Collection Time: 09/17/17  3:44 PM  Result Value Ref Range   Troponin I 0.04 (HH) <0.03 ng/mL    Comment: CRITICAL RESULT CALLED TO, READ BACK BY AND VERIFIED WITH FELICIA STAROPOLI @ 6948 ON 09/17/2017 BY CAF Performed at [facility], [address] [place], [place] [address]   Lactic acid, plasma     Status: None   Collection Time: 09/17/17  3:51 PM  Result Value Ref Range   Lactic Acid, Venous 1.3 0.5 - 1.9 mmol/L    Comment: Performed at [facility], [address], [place], [place] [address]  Influenza panel by PCR (type A & B)     Status: None   Collection Time: 09/17/17  5:34 PM  Result Value Ref Range   Influenza A By PCR NEGATIVE NEGATIVE   Influenza B By PCR NEGATIVE NEGATIVE    Comment: (NOTE) The Xpert Xpress Flu assay is intended as an aid in the diagnosis of  influenza and should not be used as a sole basis for treatment.  This  assay is FDA approved for nasopharyngeal swab specimens only. Nasal  washings and aspirates are unacceptable for Xpert Xpress Flu testing. Performed at [facility], [address] [place],  [address]      ASSESSMENT: 84 y.o. year old male here with lewy body dementia, increased agitation and hallucinations.    1. Lewy body  dementia with behavioral disturbance   2. Agitation   3. Visual hallucination        PLAN: -decrease in MMSE 19 to 12 - namenda 83m BID    - important to monitor for increase in hallucinations, increased  irritability or dizziness as side effect  - unable to initiate titration due to patient being in facility. Daughter will  call after 1 month to increase dose if tolerated -considered increase in seroquel but will hold off at this time due to difficulty in balance and gait at this time -consider nudexta for PBA in the future -follow up in 6 months   I agree with plan and assessment. I personally spoke with the patient and daughter regarding plan of care.   MClabe Seal MEGAN 10/20/17   Meds ordered this encounter  Medications  . memantine (NAMENDA) 5 MG tablet    Sig: Take 1 tablet (5 mg total) by mouth 2 (two) times daily.    Dispense:  60 tablet    Refill:  5    Order Specific Question:   Supervising Provider    Answer:   PPenni Bombard[[id number]]   Return in about 6 months (around 04/19/2018).  Greater than 50% of time during this 25  minute visit was spent on counseling,explanation of diagnosis, planning of further management, discussion with patient and family and coordination of care.

## 2017-10-30 ENCOUNTER — Emergency Department: Payer: Medicare Other

## 2017-10-30 ENCOUNTER — Emergency Department
Admission: EM | Admit: 2017-10-30 | Discharge: 2017-10-30 | Disposition: A | Discharge status: Other Acute Inpatient Hospital | Payer: Medicare Other | Attending: Emergency Medicine | Admitting: Emergency Medicine

## 2017-10-30 ENCOUNTER — Other Ambulatory Visit: Payer: Self-pay

## 2017-10-30 ENCOUNTER — Encounter (HOSPITAL_COMMUNITY): Payer: Self-pay | Admitting: Emergency Medicine

## 2017-10-30 ENCOUNTER — Inpatient Hospital Stay (HOSPITAL_COMMUNITY)
Admission: EM | Admit: 2017-10-30 | Discharge: 2017-11-05 | DRG: 183 | Disposition: A | Discharge status: Skilled Nursing Facility | Payer: Medicare Other | Attending: General Surgery | Admitting: General Surgery

## 2017-10-30 ENCOUNTER — Encounter: Payer: Self-pay | Admitting: Emergency Medicine

## 2017-10-30 DIAGNOSIS — Z96641 Presence of right artificial hip joint: Secondary | ICD-10-CM | POA: Diagnosis present

## 2017-10-30 DIAGNOSIS — K219 Gastro-esophageal reflux disease without esophagitis: Secondary | ICD-10-CM | POA: Diagnosis present

## 2017-10-30 DIAGNOSIS — Z87891 Personal history of nicotine dependence: Secondary | ICD-10-CM

## 2017-10-30 DIAGNOSIS — Y9301 Activity, walking, marching and hiking: Secondary | ICD-10-CM | POA: Diagnosis not present

## 2017-10-30 DIAGNOSIS — Z87442 Personal history of urinary calculi: Secondary | ICD-10-CM

## 2017-10-30 DIAGNOSIS — S2242XA Multiple fractures of ribs, left side, initial encounter for closed fracture: Secondary | ICD-10-CM

## 2017-10-30 DIAGNOSIS — Z85828 Personal history of other malignant neoplasm of skin: Secondary | ICD-10-CM

## 2017-10-30 DIAGNOSIS — J939 Pneumothorax, unspecified: Secondary | ICD-10-CM | POA: Insufficient documentation

## 2017-10-30 DIAGNOSIS — S2239XA Fracture of one rib, unspecified side, initial encounter for closed fracture: Secondary | ICD-10-CM | POA: Diagnosis present

## 2017-10-30 DIAGNOSIS — G4730 Sleep apnea, unspecified: Secondary | ICD-10-CM | POA: Diagnosis present

## 2017-10-30 DIAGNOSIS — Z9049 Acquired absence of other specified parts of digestive tract: Secondary | ICD-10-CM

## 2017-10-30 DIAGNOSIS — J942 Hemothorax: Secondary | ICD-10-CM | POA: Diagnosis not present

## 2017-10-30 DIAGNOSIS — Y92129 Unspecified place in nursing home as the place of occurrence of the external cause: Secondary | ICD-10-CM | POA: Diagnosis not present

## 2017-10-30 DIAGNOSIS — R1010 Upper abdominal pain, unspecified: Secondary | ICD-10-CM | POA: Diagnosis not present

## 2017-10-30 DIAGNOSIS — S299XXA Unspecified injury of thorax, initial encounter: Secondary | ICD-10-CM | POA: Diagnosis present

## 2017-10-30 DIAGNOSIS — S32009A Unspecified fracture of unspecified lumbar vertebra, initial encounter for closed fracture: Secondary | ICD-10-CM | POA: Diagnosis not present

## 2017-10-30 DIAGNOSIS — E440 Moderate protein-calorie malnutrition: Secondary | ICD-10-CM

## 2017-10-30 DIAGNOSIS — D649 Anemia, unspecified: Secondary | ICD-10-CM | POA: Diagnosis not present

## 2017-10-30 DIAGNOSIS — D709 Neutropenia, unspecified: Secondary | ICD-10-CM | POA: Diagnosis present

## 2017-10-30 DIAGNOSIS — F329 Major depressive disorder, single episode, unspecified: Secondary | ICD-10-CM | POA: Diagnosis present

## 2017-10-30 DIAGNOSIS — S2249XA Multiple fractures of ribs, unspecified side, initial encounter for closed fracture: Secondary | ICD-10-CM

## 2017-10-30 DIAGNOSIS — W19XXXA Unspecified fall, initial encounter: Secondary | ICD-10-CM | POA: Diagnosis present

## 2017-10-30 DIAGNOSIS — D696 Thrombocytopenia, unspecified: Secondary | ICD-10-CM | POA: Insufficient documentation

## 2017-10-30 DIAGNOSIS — I10 Essential (primary) hypertension: Secondary | ICD-10-CM | POA: Insufficient documentation

## 2017-10-30 DIAGNOSIS — G3183 Dementia with Lewy bodies: Secondary | ICD-10-CM | POA: Diagnosis present

## 2017-10-30 DIAGNOSIS — Y999 Unspecified external cause status: Secondary | ICD-10-CM | POA: Insufficient documentation

## 2017-10-30 DIAGNOSIS — F0281 Dementia in other diseases classified elsewhere with behavioral disturbance: Secondary | ICD-10-CM | POA: Diagnosis present

## 2017-10-30 DIAGNOSIS — Z79899 Other long term (current) drug therapy: Secondary | ICD-10-CM

## 2017-10-30 DIAGNOSIS — W050XXA Fall from non-moving wheelchair, initial encounter: Secondary | ICD-10-CM | POA: Insufficient documentation

## 2017-10-30 DIAGNOSIS — S271XXA Traumatic hemothorax, initial encounter: Secondary | ICD-10-CM | POA: Diagnosis present

## 2017-10-30 DIAGNOSIS — Z86010 Personal history of colonic polyps: Secondary | ICD-10-CM

## 2017-10-30 HISTORY — DX: Multiple fractures of ribs, unspecified side, initial encounter for closed fracture: S22.49XA

## 2017-10-30 LAB — CBC WITH DIFFERENTIAL/PLATELET
BASOS ABS: 0 10*3/uL (ref 0–0.1)
Basophils Relative: 0 %
EOS PCT: 0 %
Eosinophils Absolute: 0 10*3/uL (ref 0–0.7)
HEMATOCRIT: 25.3 % — AB (ref 40.0–52.0)
Hemoglobin: 8.5 g/dL — ABNORMAL LOW (ref 13.0–18.0)
LYMPHS ABS: 0.6 10*3/uL — AB (ref 1.0–3.6)
LYMPHS PCT: 26 %
MCH: 37 pg — AB (ref 26.0–34.0)
MCHC: 33.5 g/dL (ref 32.0–36.0)
MCV: 110.6 fL — ABNORMAL HIGH (ref 80.0–100.0)
MONO ABS: 0.5 10*3/uL (ref 0.2–1.0)
MONOS PCT: 21 %
NEUTROS ABS: 1.2 10*3/uL — AB (ref 1.4–6.5)
Neutrophils Relative %: 53 %
PLATELETS: 99 10*3/uL — AB (ref 150–440)
RBC: 2.28 MIL/uL — ABNORMAL LOW (ref 4.40–5.90)
RDW: 15.8 % — AB (ref 11.5–14.5)
WBC: 2.2 10*3/uL — ABNORMAL LOW (ref 3.8–10.6)

## 2017-10-30 LAB — BASIC METABOLIC PANEL
ANION GAP: 7 (ref 5–15)
BUN: 19 mg/dL (ref 6–20)
CHLORIDE: 105 mmol/L (ref 101–111)
CO2: 25 mmol/L (ref 22–32)
Calcium: 8.3 mg/dL — ABNORMAL LOW (ref 8.9–10.3)
Creatinine, Ser: 0.97 mg/dL (ref 0.61–1.24)
GFR calc Af Amer: >60 mL/min (ref >60)
Glucose, Bld: 109 mg/dL — ABNORMAL HIGH (ref 65–99)
POTASSIUM: 4.2 mmol/L (ref 3.5–5.1)
Sodium: 137 mmol/L (ref 135–145)

## 2017-10-30 LAB — URINALYSIS, COMPLETE (UACMP) WITH MICROSCOPIC
BACTERIA UA: NONE SEEN
BILIRUBIN URINE: NEGATIVE
Glucose, UA: NEGATIVE mg/dL
Hgb urine dipstick: NEGATIVE
KETONES UR: NEGATIVE mg/dL
Leukocytes, UA: NEGATIVE
Nitrite: NEGATIVE
PROTEIN: NEGATIVE mg/dL
SPECIFIC GRAVITY, URINE: 1.033 — AB (ref 1.005–1.030)
SQUAMOUS EPITHELIAL / LPF: NONE SEEN
pH: 6 (ref 5.0–8.0)

## 2017-10-30 LAB — TROPONIN I: Troponin I: <0.03 ng/mL (ref <0.03)

## 2017-10-30 MED ORDER — ACETAMINOPHEN 325 MG PO TABS
650.0000 mg | ORAL_TABLET | Freq: Once | ORAL | Status: AC
  Administered 2017-10-30: 650 mg via ORAL
  Filled 2017-10-30: qty 2

## 2017-10-30 MED ORDER — FLUOXETINE HCL 20 MG PO CAPS
40.0000 mg | ORAL_CAPSULE | Freq: Every day | ORAL | Status: DC
  Administered 2017-10-31 – 2017-11-05 (×6): 40 mg via ORAL
  Filled 2017-10-30 (×8): qty 2

## 2017-10-30 MED ORDER — MORPHINE SULFATE (PF) 2 MG/ML IV SOLN
2.0000 mg | Freq: Once | INTRAVENOUS | Status: AC
  Administered 2017-10-30: 2 mg via INTRAVENOUS
  Filled 2017-10-30: qty 1

## 2017-10-30 MED ORDER — QUETIAPINE FUMARATE 25 MG PO TABS
25.0000 mg | ORAL_TABLET | Freq: Every day | ORAL | Status: DC
  Administered 2017-10-30 – 2017-11-04 (×6): 25 mg via ORAL
  Filled 2017-10-30 (×6): qty 1

## 2017-10-30 MED ORDER — HYDROCODONE-ACETAMINOPHEN 5-325 MG PO TABS
1.0000 | ORAL_TABLET | ORAL | Status: DC | PRN
  Administered 2017-10-31 – 2017-11-01 (×5): 1 via ORAL
  Filled 2017-10-30 (×5): qty 1

## 2017-10-30 MED ORDER — SODIUM CHLORIDE 0.9 % IV SOLN: INTRAVENOUS | Status: DC

## 2017-10-30 MED ORDER — MORPHINE SULFATE (PF) 4 MG/ML IV SOLN
2.0000 mg | Freq: Once | INTRAVENOUS | Status: AC
  Administered 2017-10-30: 2 mg via INTRAVENOUS
  Filled 2017-10-30: qty 1

## 2017-10-30 MED ORDER — ENOXAPARIN SODIUM 40 MG/0.4ML ~~LOC~~ SOLN
40.0000 mg | SUBCUTANEOUS | Status: DC
Start: 2017-10-31 — End: 2017-11-05
  Administered 2017-10-31 – 2017-11-05 (×6): 40 mg via SUBCUTANEOUS
  Filled 2017-10-30 (×6): qty 0.4

## 2017-10-30 MED ORDER — ONDANSETRON 4 MG PO TBDP: 4.0000 mg | ORAL_TABLET | Freq: Four times a day (QID) | ORAL | Status: DC | PRN

## 2017-10-30 MED ORDER — ONDANSETRON HCL 4 MG/2ML IJ SOLN: 4.0000 mg | Freq: Four times a day (QID) | INTRAMUSCULAR | Status: DC | PRN

## 2017-10-30 MED ORDER — HYDROXYZINE HCL 10 MG PO TABS
10.0000 mg | ORAL_TABLET | Freq: Every evening | ORAL | Status: DC | PRN
  Administered 2017-10-31 – 2017-11-04 (×4): 10 mg via ORAL
  Filled 2017-10-30 (×7): qty 1

## 2017-10-30 MED ORDER — DONEPEZIL HCL 10 MG PO TABS
10.0000 mg | ORAL_TABLET | Freq: Every day | ORAL | Status: DC
  Administered 2017-10-31 – 2017-11-05 (×6): 10 mg via ORAL
  Filled 2017-10-30 (×6): qty 1

## 2017-10-30 MED ORDER — TRAMADOL HCL 50 MG PO TABS
50.0000 mg | ORAL_TABLET | Freq: Once | ORAL | Status: AC
  Administered 2017-10-30: 50 mg via ORAL
  Filled 2017-10-30: qty 1

## 2017-10-30 MED ORDER — ACETAMINOPHEN 325 MG PO TABS: 650.0000 mg | ORAL_TABLET | ORAL | Status: DC | PRN

## 2017-10-30 MED ORDER — IOPAMIDOL (ISOVUE-300) INJECTION 61%
100.0000 mL | Freq: Once | INTRAVENOUS | Status: AC | PRN
  Administered 2017-10-30: 100 mL via INTRAVENOUS

## 2017-10-30 NOTE — ED Notes (Signed)
Attempted report x1. 

## 2017-10-30 NOTE — ED Triage Notes (Signed)
Per [facility] RN:  Patient [facility] transfer from [facility] with multiple left sided rib fractures after a fall from sitting at the nursing home [facility]).  L2 wing fracture as well.  Last dose of pain meds at 1245, NAD, 95% RA

## 2017-10-30 NOTE — ED Notes (Signed)
Report given to Jerene Pitch, Therapist, sports at [facility] ED

## 2017-10-30 NOTE — ED Provider Notes (Signed)
[facility] Emergency Department Provider Note ____________________________________________   I have reviewed the triage vital signs and the triage nursing note.  HISTORY  Chief Complaint Fall   Historian Level 5 Caveat History Limited by patient poor historian Daughter-in-law provides baseline history  HPI John Summers is a 84 y.o. male from nursing home, patient was in the bathroom and apparently the staff stepped out to grab is closed and the patient had unwitnessed fall.  Unclear whether or not he hit his head.  No complaint of headache, however he was complaining of left side and right side chest pain and left arm pain.  Family member reports patient had a fall yesterday at 2 AM, but there is no clear signs of injury, patient was not evaluated at that point time.  He has recently been on Bactrim for urinary tract infection.  Denies abdominal pain.  Denies leg pain.  Denies pelvic pain.  No altered mental status.  Recently started on Namenda, daughter-in-law notes they have a camera to watch him and he did not really sleep much last night.   Past Medical History:  Diagnosis Date  . 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 Complication of anesthesia    severe confusion  . GERD (gastroesophageal reflux disease)   . History of colon polyps   . History of gallstones   . Hx of transfusion of platelets   . Hypertension   . ITP (idiopathic thrombocytopenic purpura)    Dr. Grayland Ormond follows  . Kidney stones   . Lewy body dementia with behavioral disturbance   . Major depressive disorder, recurrent episode, in partial remission with anxious distress ([place])   . Pancreatitis 2/11  . Pollen allergies   . Skin cancer    ears  . Sleep apnea    "won't wear mask anymore" (12/22/2012)  . Unspecified essential hypertension     Patient Active Problem List   Diagnosis Date Noted  . 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 Pancytopenia, acquired ([place])  08/02/2017  . Visual hallucination 08/02/2017  . Generalized osteoarthritis 03/05/2017  . AF (paroxysmal atrial fibrillation) ([place]) 02/01/2017  . Near syncope 01/06/2017  . Gait disturbance 12/30/2016  . MGUS (monoclonal gammopathy of unknown significance) 05/10/2016  . Pancreatic mass 04/24/2016  . Thrombocytopenia ([place]) 10/04/2015  . Uncontrolled REM sleep behavior disorder 11/16/2014  . Lewy body dementia with behavioral disturbance 11/16/2014  . Tremor of right hand 11/16/2014  . Major depressive disorder, recurrent episode, in partial remission with anxious distress ([place])   . REM sleep behavior disorder 12/07/2013  . Incisional hernia, without obstruction or gangrene 11/14/2012  . Adenocarcinoma of cecum ([place]) 03/19/2012  . GERD (gastroesophageal reflux disease) 03/19/2012  . Sleep apnea   . Essential hypertension     Past Surgical History:  Procedure Laterality Date  . CARDIAC CATHETERIZATION  2000's   "tx'd w/acid reflux RX" (12/22/2012)  . CHOLECYSTECTOMY  2011  . ESOPHAGEAL DILATION  1990's?  Marland Kitchen HERNIA REPAIR  12/22/2012   IHR w/mesh  . INGUINAL HERNIA REPAIR Left [id number]  . INSERTION OF MESH N/A 12/22/2012   Procedure: INSERTION OF MESH;  Surgeon: Harl Bowie, MD;  Location: [facility];  Service: General;  Laterality: N/A;  . LITHOTRIPSY  2008   x2  . PARTIAL COLECTOMY  03/21/2012   Procedure: PARTIAL COLECTOMY;  Surgeon: Harl Bowie, MD;  Location: [facility];  Service: General;  Laterality: Right;  . PILONIDAL CYST EXCISION  1960's?  Marland Kitchen SKIN CANCER EXCISION     "  face; ?left ear" (12/22/2012)  . TONSILLECTOMY AND ADENOIDECTOMY  1946  . TOTAL HIP ARTHROPLASTY Left ~ 2002  . UPPER GASTROINTESTINAL ENDOSCOPY    . VENTRAL HERNIA REPAIR N/A 12/22/2012   Procedure: HERNIA REPAIR INCISIONAL ;  Surgeon: Harl Bowie, MD;  Location: [facility];  Service: General;  Laterality: N/A;    Prior to Admission medications   Medication Sig Start Date End Date Taking? Authorizing  Provider  acetaminophen (TYLENOL) 325 MG tablet Take 650 mg by mouth 3 (three) times daily.   Yes [provider]  donepezil (ARICEPT) 10 MG tablet take 1 tablet by mouth once daily 11/09/16  Yes Dohmeier, Asencion Partridge, MD  FLUoxetine (PROZAC) 20 MG tablet Take 2 tablets (40 mg total) by mouth daily. 2 tab of 20 mg once a day. 04/19/17  Yes Dohmeier, Asencion Partridge, MD  hydrOXYzine (ATARAX/VISTARIL) 10 MG tablet take 1 tablet by mouth at bedtime 12/07/16  Yes Dohmeier, Asencion Partridge, MD  Melatonin 5 MG TABS Take 1 tablet (5 mg total) by mouth at bedtime. 04/19/17  Yes Dohmeier, Asencion Partridge, MD  memantine (NAMENDA) 5 MG tablet Take 1 tablet (5 mg total) by mouth 2 (two) times daily. 10/20/17  Yes Venancio Poisson, NP  QUEtiapine (SEROQUEL) 25 MG tablet Take 1 tablet (25 mg total) by mouth at bedtime. 08/30/17  Yes Dohmeier, Asencion Partridge, MD  traMADol (ULTRAM) 50 MG tablet Take 1 tablet (50 mg total) every 6 (six) hours as needed by mouth. 07/31/17 07/31/18 Yes Gregor Hams, MD  ALPRAZolam Duanne Moron) 0.5 MG tablet Take 1 tablet (0.5 mg total) by mouth at bedtime as needed for anxiety. Patient not taking: Reported on 07/02/2017 02/02/17   Henreitta Leber, MD    Allergies  Allergen Reactions  . Aspirin Other (See Comments)  . Chicken Allergy Nausea And Vomiting    Unknown   . Other Nausea And Vomiting    [place] per daughter     Family History  Problem Relation Age of Onset  . Colon cancer Mother   . Heart disease Mother   . Hypertension Mother   . Alcohol abuse Father   . Esophageal cancer Neg Hx   . Rectal cancer Neg Hx   . Stomach cancer Neg Hx     Social History Social History   Tobacco Use  . Smoking status: Former Smoker    Types: Pipe  . Smokeless tobacco: Never Used  . Tobacco comment: i4/06/2013 "only smoked pipe n college 1958"  Substance Use Topics  . Alcohol use: No    Alcohol/week: 0.0 oz  . Drug use: No    Review of Systems  Constitutional: Negative for fever. Eyes: Negative for  visual changes. ENT: Negative for sore throat. Cardiovascular: Positive as per HPI for chest pain. Respiratory: Negative for shortness of breath. Gastrointestinal: Sounds like may have initially reported abdominal pain, but no abdominal pain reported now.   Genitourinary: Negative for dysuria. Musculoskeletal: Positive for left-sided/midline back pain. Skin: Negative for rash. Neurological: Negative for headache.  ____________________________________________   PHYSICAL EXAM:  VITAL SIGNS: ED Triage Vitals  Enc Vitals Group     BP 10/30/17 0708 125/69     Pulse Rate 10/30/17 0708 74     Resp 10/30/17 0708 16     Temp 10/30/17 0708 98.6 F (37 C)     Temp Source 10/30/17 0708 Oral     SpO2 10/30/17 0708 92 %     Weight 10/30/17 0709 166 lb (75.3 kg)     Height --  Head Circumference --      Peak Flow --      Pain Score --      Pain Loc --      Pain Edu? --      Excl. in [place]? --      Constitutional: Alert and cooperative. Well appearing and in no distress when at rest. HEENT   Head: Normocephalic and atraumatic.      Eyes: Conjunctivae are normal. Pupils equal and round.       Ears:         Nose: No congestion/rhinnorhea.   Mouth/Throat: Mucous membranes are moist.   Neck: No stridor.  No C-spine step-off, but he does have soreness both sides as well as centrally/posterior. Cardiovascular/Chest: Chest wall severely painful to the left side of the chest, no flail chest visible.  Normal rate, regular rhythm.  No murmurs, rubs, or gallops. Respiratory: Normal respiratory effort without tachypnea nor retractions. Breath sounds are clear and equal bilaterally. No wheezes/rales/rhonchi. Gastrointestinal: Soft. No distention, no guarding, no rebound. Nontender.    Genitourinary/rectal:Deferred Musculoskeletal: Stable pelvis.  Nontender lower extremities.  Patient reporting some pain with movement of the left shoulder, unclear whether or not this is rib or shoulder  pain.  Neurovascularly intact in that left upper extremity.   No edema. Neurologic:  Normal speech and language. No gross or focal neurologic deficits are appreciated. Skin:  Skin is warm, dry and intact. No rash noted. Psychiatric: Mood and affect are normal.    ____________________________________________  LABS (pertinent positives/negatives) I, Lisa Roca, MD the attending physician have reviewed the labs noted below.  Labs Reviewed  URINALYSIS, COMPLETE (UACMP) WITH MICROSCOPIC - Abnormal; Notable for the following components:      Result Value   Color, Urine YELLOW (*)    APPearance CLEAR (*)    Specific Gravity, Urine 1.033 (*)    All other components within normal limits  BASIC METABOLIC PANEL - Abnormal; Notable for the following components:   Glucose, Bld 109 (*)    Calcium 8.3 (*)    All other components within normal limits  CBC WITH DIFFERENTIAL/PLATELET - Abnormal; Notable for the following components:   WBC 2.2 (*)    RBC 2.28 (*)    Hemoglobin 8.5 (*)    HCT 25.3 (*)    MCV 110.6 (*)    MCH 37.0 (*)    RDW 15.8 (*)    Platelets 99 (*)    Neutro Abs 1.2 (*)    Lymphs Abs 0.6 (*)    All other components within normal limits  TROPONIN I    ____________________________________________    EKG I, Lisa Roca, MD, the attending physician have personally viewed and interpreted all ECGs.  73 bpm.  Normal sinus rhythm.  Nonspecific intraventricular conduction delay.  Left axis deviation.  Nonspecific ST and T wave ____________________________________________  RADIOLOGY All Xrays were viewed by me.  Imaging interpreted by Radiologist, and I, Lisa Roca, MD the attending physician have reviewed the radiologist interpretation noted below.  CT chest abd pel with contrast:  IMPRESSION: Multiple acute displaced left rib fractures 5 through 12. Small left pneumothorax/hemothorax. Bibasilar atelectasis.  No acute intra-abdominal injury.  Suggestion of subtle  acute fracture along the base of the right L2 transverse process.  Nonobstructing 2 mm left renal stone. Subcentimeter hypodensity left mid pole renal cortex too small to characterize but likely a cyst.  Midline ventral hernia below the umbilicus containing several small bowel loops. No evidence of bowel  obstruction.  Aortic Atherosclerosis (ICD10-I70.0). Mild atherosclerotic coronary artery disease.  Right hip iliopsoas bursitis.  Critical Value/emergent results were called by telephone at the time of interpretation on 10/30/2017 at 10:28 am to Dr. Conni Slipper, who verbally acknowledged these results.  DG left shoulder:  Negative  DG left ribs with chest -- multiple left sided rib fractures Radiologist interp -   CLINICAL DATA: Left rib and shoulder pain after fall.  EXAM: LEFT RIBS AND CHEST - 3+ VIEW  COMPARISON: 09/17/2017  FINDINGS: Retrocardiac densities compatible with consolidation or focal volume loss. There are multiple displaced left rib fractures. Fractures involving the left fifth, sixth, seventh, eighth, ninth and tenth ribs. Possible fracture of the left eleventh rib. Haziness at the left costophrenic angle is suggestive for atelectasis and probable small pleural effusion. Negative for a pneumothorax. Heart size is normal and within normal limits. Trachea is midline. Left clavicle appears to be intact. No gross abnormality to the left shoulder. Old right lower rib fractures.  IMPRESSION: Numerous displaced left rib fractures.  Left basilar chest densities are suggestive for atelectasis and suspect small left pleural effusion.     Ct head and c spine without contrast -discussed with radiologist: IMPRESSION: 1. Small left apical hydropneumothorax, incompletely imaged. Recommend dedicated CT chest with contrast in further evaluation. Critical Value/emergent results were called by telephone at the time of interpretation on 10/30/2017 at 9:47 am to Dr.  Lisa Roca , who verbally acknowledged these results. 2. Otherwise, no evidence of acute trauma in the head or cervical spine. 3. Atrophy and chronic microvascular white matter ischemic changes. 4. Multilevel degenerative disc disease and facet hypertrophy. __________________________________________  PROCEDURES  Procedure(s) performed: None  Critical Care performed: None   ____________________________________________  ED COURSE / ASSESSMENT AND PLAN  Pertinent labs & imaging results that were available during my care of the patient were reviewed by me and considered in my medical decision making (see chart for details).    2 falls in about 24 hours, will assess for traumatic injury as well as cause of frequent falls.  CXR with multiple rib fx. CT head/cs called to me with small ptx -- will CT cap for further invest.  CT CAP discussed with me -- 5-12 rib fx with small assoc hemo-pneumotx. Patient clinically well appearing with no significant pain at rest and no hypoxia.  No head, cs or intraabd traumatic injury.  Labs not significant for potential source of multiple falls.  Family feels like maybe his lack of sleep recently due to new Namenda may have been the reason for increased falls.  He is noted to have slight anemia and slight thrombocytopenia, as well as low white blood cell count.  This may be further worked up as patient is going to be hospitalized.  I discussed with trauma surgeon, Dr. Donne Hazel, with [facility], patient will be accepted ED to ED to trauma service for traumatic injuries.   CONSULTATIONS:   None   Patient / Family / Caregiver informed of clinical course, medical decision-making process, and agree with plan.  ___________________________________________   FINAL CLINICAL IMPRESSION(S) / ED DIAGNOSES   Final diagnoses:  Multiple rib fractures involving four or more ribs  Hemothorax on left  Pneumothorax, left  Lumbar transverse process  fracture, closed, initial encounter ([place])  Thrombocytopenia ([place])  Anemia, unspecified type      ___________________________________________        Note: This dictation was prepared with Dragon dictation. Any transcriptional errors that result from this process  are unintentional    Lisa Roca, MD 10/30/17 1221

## 2017-10-30 NOTE — ED Provider Notes (Signed)
[place] EMERGENCY DEPARTMENT Provider Note   CSN: [id number] Arrival date & time: 10/30/17  1338     History   Chief Complaint Chief Complaint  Patient presents with  . Rib Fracture    HPI John Summers is a 84 y.o. male.  84 year old male is presenting as a trauma transfer from [facility] ED.  Patient was accepted in transfer by Dr. Donne Hazel of the trauma service.  Patient with recent fall.  Workup today at [facility] revealed multiple left-sided rib fractures.  Patient also with a small pneumo/hemothorax found on CT imaging.  Upon arrival to the ED patient appears to be comfortable.  He is not in distress.    The history is provided by medical records and a relative. The history is limited by the condition of the patient.  Illness  This is a new problem. The current episode started 6 to 12 hours ago. The problem occurs rarely. The problem has not changed since onset.Nothing aggravates the symptoms. Nothing relieves the symptoms.    Past Medical History:  Diagnosis Date  . Colon adenocarcinoma ([place])   . Complication of anesthesia    severe confusion  . GERD (gastroesophageal reflux disease)   . History of colon polyps   . History of gallstones   . Hx of transfusion of platelets   . Hypertension   . ITP (idiopathic thrombocytopenic purpura)    Dr. Grayland Ormond follows  . Kidney stones   . Lewy body dementia with behavioral disturbance   . Major depressive disorder, recurrent episode, in partial remission with anxious distress ([place])   . Pancreatitis 2/11  . Pollen allergies   . Skin cancer    ears  . Sleep apnea    "won't wear mask anymore" (12/22/2012)  . Unspecified essential hypertension     Patient Active Problem List   Diagnosis Date Noted  . 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 Pancytopenia, acquired ([place]) 08/02/2017  . Visual hallucination 08/02/2017  . Generalized osteoarthritis 03/05/2017  . AF (paroxysmal atrial fibrillation)  ([place]) 02/01/2017  . Near syncope 01/06/2017  . Gait disturbance 12/30/2016  . MGUS (monoclonal gammopathy of unknown significance) 05/10/2016  . Pancreatic mass 04/24/2016  . Thrombocytopenia ([place]) 10/04/2015  . Uncontrolled REM sleep behavior disorder 11/16/2014  . Lewy body dementia with behavioral disturbance 11/16/2014  . Tremor of right hand 11/16/2014  . Major depressive disorder, recurrent episode, in partial remission with anxious distress ([place])   . REM sleep behavior disorder 12/07/2013  . Incisional hernia, without obstruction or gangrene 11/14/2012  . Adenocarcinoma of cecum ([place]) 03/19/2012  . GERD (gastroesophageal reflux disease) 03/19/2012  . Sleep apnea   . Essential hypertension     Past Surgical History:  Procedure Laterality Date  . CARDIAC CATHETERIZATION  2000's   "tx'd w/acid reflux RX" (12/22/2012)  . CHOLECYSTECTOMY  2011  . ESOPHAGEAL DILATION  1990's?  Marland Kitchen HERNIA REPAIR  12/22/2012   IHR w/mesh  . INGUINAL HERNIA REPAIR Left [id number]  . INSERTION OF MESH N/A 12/22/2012   Procedure: INSERTION OF MESH;  Surgeon: Harl Bowie, MD;  Location: [place];  Service: General;  Laterality: N/A;  . LITHOTRIPSY  2008   x2  . PARTIAL COLECTOMY  03/21/2012   Procedure: PARTIAL COLECTOMY;  Surgeon: Harl Bowie, MD;  Location: [place];  Service: General;  Laterality: Right;  . PILONIDAL CYST EXCISION  1960's?  Marland Kitchen SKIN CANCER EXCISION     "face; ?left ear" (12/22/2012)  . TONSILLECTOMY AND ADENOIDECTOMY  [place] Left ~ 2002  . UPPER GASTROINTESTINAL ENDOSCOPY    . VENTRAL HERNIA REPAIR N/A 12/22/2012   Procedure: HERNIA REPAIR INCISIONAL ;  Surgeon: Harl Bowie, MD;  Location: [place];  Service: General;  Laterality: N/A;       Home Medications    Prior to Admission medications   Medication Sig Start Date End Date Taking? Authorizing Provider  acetaminophen (TYLENOL) 325 MG tablet Take 650 mg by mouth 3 (three) times daily.     [provider]  ALPRAZolam Duanne Moron) 0.5 MG tablet Take 1 tablet (0.5 mg total) by mouth at bedtime as needed for anxiety. Patient not taking: Reported on 07/02/2017 02/02/17   Henreitta Leber, MD  donepezil (ARICEPT) 10 MG tablet take 1 tablet by mouth once daily 11/09/16   Dohmeier, Asencion Partridge, MD  FLUoxetine (PROZAC) 20 MG tablet Take 2 tablets (40 mg total) by mouth daily. 2 tab of 20 mg once a day. 04/19/17   Dohmeier, Asencion Partridge, MD  hydrOXYzine (ATARAX/VISTARIL) 10 MG tablet take 1 tablet by mouth at bedtime 12/07/16   Dohmeier, Asencion Partridge, MD  Melatonin 5 MG TABS Take 1 tablet (5 mg total) by mouth at bedtime. 04/19/17   Dohmeier, Asencion Partridge, MD  memantine (NAMENDA) 5 MG tablet Take 1 tablet (5 mg total) by mouth 2 (two) times daily. 10/20/17   Venancio Poisson, NP  QUEtiapine (SEROQUEL) 25 MG tablet Take 1 tablet (25 mg total) by mouth at bedtime. 08/30/17   Dohmeier, Asencion Partridge, MD  traMADol Veatrice Bourbon) 50 MG tablet Take 1 tablet (50 mg total) every 6 (six) hours as needed by mouth. 07/31/17 07/31/18  Gregor Hams, MD    Family History Family History  Problem Relation Age of Onset  . Colon cancer Mother   . Heart disease Mother   . Hypertension Mother   . Alcohol abuse Father   . Esophageal cancer Neg Hx   . Rectal cancer Neg Hx   . Stomach cancer Neg Hx     Social History Social History   Tobacco Use  . Smoking status: Former Smoker    Types: Pipe  . Smokeless tobacco: Never Used  . Tobacco comment: i4/06/2013 "only smoked pipe n college 1958"  Substance Use Topics  . Alcohol use: No    Alcohol/week: 0.0 oz  . Drug use: No     Allergies   Aspirin; Chicken allergy; and Other   Review of Systems Review of Systems  Unable to perform ROS: Dementia  All other systems reviewed and are negative.    Physical Exam Updated Vital Signs BP 102/71 (BP Location: Left Arm)   Pulse 77   Temp 99.6 F (37.6 C) (Oral)   Resp 18   SpO2 95%   Physical Exam  Constitutional: He  appears well-developed and well-nourished. No distress.  HENT:  Head: Normocephalic and atraumatic.  Mouth/Throat: Oropharynx is clear and moist.  Eyes: Conjunctivae and EOM are normal. Pupils are equal, round, and reactive to light.  Neck: Normal range of motion. Neck supple.  Cardiovascular: Normal rate, regular rhythm and normal heart sounds.  Pulmonary/Chest: Effort normal and breath sounds normal. No respiratory distress.  Tender to left thorax   Abdominal: Soft. He exhibits no distension. There is no tenderness.  Musculoskeletal: Normal range of motion. He exhibits no edema or deformity.  Neurological: He is alert.  Skin: Skin is warm and dry.  Nursing note and vitals reviewed.    ED Treatments / Results  Labs (  all labs ordered are listed, but only abnormal results are displayed) Labs Reviewed - No data to display  EKG  EKG Interpretation None       Radiology Dg Ribs Unilateral W/chest Left  Result Date: 10/30/2017 CLINICAL DATA:  Left rib and shoulder pain after fall. EXAM: LEFT RIBS AND CHEST - 3+ VIEW COMPARISON:  09/17/2017 FINDINGS: Retrocardiac densities compatible with consolidation or focal volume loss. There are multiple displaced left rib fractures. Fractures involving the left fifth, sixth, seventh, eighth, ninth and tenth ribs. Possible fracture of the left eleventh rib. Haziness at the left costophrenic angle is suggestive for atelectasis and probable small pleural effusion. Negative for a pneumothorax. Heart size is normal and within normal limits. Trachea is midline. Left clavicle appears to be intact. No gross abnormality to the left shoulder. Old right lower rib fractures. IMPRESSION: Numerous displaced left rib fractures. Left basilar chest densities are suggestive for atelectasis and suspect small left pleural effusion. Electronically Signed   By: Markus Daft M.D.   On: 10/30/2017 09:50   Ct Head Wo Contrast  Result Date: 10/30/2017 CLINICAL DATA:  Fall,  chest wall pain and upper abdominal pain. Bilateral arm pain. EXAM: CT HEAD WITHOUT CONTRAST CT CERVICAL SPINE WITHOUT CONTRAST TECHNIQUE: Multidetector CT imaging of the head and cervical spine was performed following the standard protocol without intravenous contrast. Multiplanar CT image reconstructions of the cervical spine were also generated. COMPARISON:  07/31/2017. FINDINGS: CT HEAD FINDINGS Brain: No evidence of an acute infarct, acute hemorrhage, mass lesion, mass effect or hydrocephalus. Atrophy. Periventricular low attenuation. Vascular: No hyperdense vessel or unexpected calcification. Skull: Normal. Negative for fracture or focal lesion. Sinuses/Orbits: No acute finding. Other: None. CT CERVICAL SPINE FINDINGS Alignment: Minimal grade 1 anterolisthesis of C7 on T1, likely degenerative. Alignment is otherwise anatomic. Skull base and vertebrae: No acute fracture. No primary bone lesion or focal pathologic process. Soft tissues and spinal canal: No prevertebral fluid or swelling. No visible canal hematoma. Disc levels:  At C1-2, marked degenerative change along the dens. At C3-4, moderate right neural foraminal narrowing with uncovertebral and bilateral facet hypertrophy. At C4-5, severe right neural foraminal narrowing with uncovertebral and bilateral facet hypertrophy. At C5-6, moderate right and mild left neural foraminal narrowing with uncovertebral/bilateral facet hypertrophy and disc space narrowing. At C6-7, moderate right and mild left neural foraminal narrowing with uncovertebral/bilateral facet hypertrophy and disc space narrowing. Upper chest: Small left apical pneumothorax and small left pleural effusion, incompletely imaged. Other: None. IMPRESSION: 1. Small left apical hydropneumothorax, incompletely imaged. Recommend dedicated CT chest with contrast in further evaluation. Critical Value/emergent results were called by telephone at the time of interpretation on 10/30/2017 at 9:47 am to Dr.  Lisa Roca , who verbally acknowledged these results. 2. Otherwise, no evidence of acute trauma in the head or cervical spine. 3. Atrophy and chronic microvascular white matter ischemic changes. 4. Multilevel degenerative disc disease and facet hypertrophy. Electronically Signed   By: Lorin Picket M.D.   On: 10/30/2017 09:47   Ct Chest W Contrast  Result Date: 10/30/2017 CLINICAL DATA:  Fall today with left-sided pain. EXAM: CT CHEST, ABDOMEN, AND PELVIS WITH CONTRAST TECHNIQUE: Multidetector CT imaging of the chest, abdomen and pelvis was performed following the standard protocol during bolus administration of intravenous contrast. CONTRAST:  164mL ISOVUE-300 IOPAMIDOL (ISOVUE-300) INJECTION 61% COMPARISON:  CT chest 03/09/2012 and CT abdomen/pelvis 01/06/2017 FINDINGS: CT CHEST FINDINGS Cardiovascular: Heart is normal size. Mild calcified plaque over the descending thoracic aorta. Mild calcified  plaque over the left main and proximal 3 vessel coronary arteries. Mediastinum/Nodes: No mediastinal or hilar adenopathy. Remaining mediastinal structures are within normal. Lungs/Pleura: Small left apical pneumothorax as well as evidence of small loculated pneumothorax over the anteromedial and anterior left base. Small amount left pleural fluid/hemorrhage with left basilar airspace opacification likely atelectasis. Posterior right basilar atelectasis. Airways are within normal. Musculoskeletal: Several bilateral anterolateral rib fractures. There are acute displaced fractures of the left ribs 5 through 12. CT ABDOMEN PELVIS FINDINGS Hepatobiliary: Previous cholecystectomy. Liver and biliary tree are within normal. Pancreas: Normal. Spleen: Normal. Adrenals/Urinary Tract: Adrenal glands are normal and symmetric. Kidneys are normal in size without hydronephrosis. 2 mm stone over the mid pole collecting system of the left kidney. Subcentimeter hypodensity over the mid pole cortex of the left kidney too small to  characterize but likely a cyst. Visualized ureters and bladder are normal. Stomach/Bowel: Stomach and small bowel are within normal. There is a midline ventral hernia just below the level of the umbilicus containing multiple loops of small bowel which are normal in caliber without adjacent inflammatory change or free fluid. Appendix is not well visualized. There is a surgical suture line over the right colon. Vascular/Lymphatic: Calcified plaque over the abdominal aorta and iliac arteries. No adenopathy. Reproductive: Within normal. Other: No significant free fluid or focal inflammatory change. No free peritoneal air. Musculoskeletal: Fracture along the right transverse process of L2 which is chronic as well as subtle fracture at the base of the right L2 transverse process which appears to be acute. Old right L3 transverse process fracture. Evidence of a iliopsoas bursitis anterior to the right hip. IMPRESSION: Multiple acute displaced left rib fractures 5 through 12. Small left pneumothorax/hemothorax. Bibasilar atelectasis. No acute intra-abdominal injury. Suggestion of subtle acute fracture along the base of the right L2 transverse process. Nonobstructing 2 mm left renal stone. Subcentimeter hypodensity left mid pole renal cortex too small to characterize but likely a cyst. Midline ventral hernia below the umbilicus containing several small bowel loops. No evidence of bowel obstruction. Aortic Atherosclerosis (ICD10-I70.0). Mild atherosclerotic coronary artery disease. Right hip iliopsoas bursitis. Critical Value/emergent results were called by telephone at the time of interpretation on 10/30/2017 at 10:28 am to Dr. Conni Slipper, who verbally acknowledged these results. Electronically Signed   By: Marin Olp M.D.   On: 10/30/2017 10:28   Ct Cervical Spine Wo Contrast  Result Date: 10/30/2017 CLINICAL DATA:  Fall, chest wall pain and upper abdominal pain. Bilateral arm pain. EXAM: CT HEAD WITHOUT CONTRAST CT  CERVICAL SPINE WITHOUT CONTRAST TECHNIQUE: Multidetector CT imaging of the head and cervical spine was performed following the standard protocol without intravenous contrast. Multiplanar CT image reconstructions of the cervical spine were also generated. COMPARISON:  07/31/2017. FINDINGS: CT HEAD FINDINGS Brain: No evidence of an acute infarct, acute hemorrhage, mass lesion, mass effect or hydrocephalus. Atrophy. Periventricular low attenuation. Vascular: No hyperdense vessel or unexpected calcification. Skull: Normal. Negative for fracture or focal lesion. Sinuses/Orbits: No acute finding. Other: None. CT CERVICAL SPINE FINDINGS Alignment: Minimal grade 1 anterolisthesis of C7 on T1, likely degenerative. Alignment is otherwise anatomic. Skull base and vertebrae: No acute fracture. No primary bone lesion or focal pathologic process. Soft tissues and spinal canal: No prevertebral fluid or swelling. No visible canal hematoma. Disc levels:  At C1-2, marked degenerative change along the dens. At C3-4, moderate right neural foraminal narrowing with uncovertebral and bilateral facet hypertrophy. At C4-5, severe right neural foraminal narrowing with uncovertebral and bilateral  facet hypertrophy. At C5-6, moderate right and mild left neural foraminal narrowing with uncovertebral/bilateral facet hypertrophy and disc space narrowing. At C6-7, moderate right and mild left neural foraminal narrowing with uncovertebral/bilateral facet hypertrophy and disc space narrowing. Upper chest: Small left apical pneumothorax and small left pleural effusion, incompletely imaged. Other: None. IMPRESSION: 1. Small left apical hydropneumothorax, incompletely imaged. Recommend dedicated CT chest with contrast in further evaluation. Critical Value/emergent results were called by telephone at the time of interpretation on 10/30/2017 at 9:47 am to Dr. Lisa Roca , who verbally acknowledged these results. 2. Otherwise, no evidence of acute trauma  in the head or cervical spine. 3. Atrophy and chronic microvascular white matter ischemic changes. 4. Multilevel degenerative disc disease and facet hypertrophy. Electronically Signed   By: Lorin Picket M.D.   On: 10/30/2017 09:47   Ct Abdomen Pelvis W Contrast  Result Date: 10/30/2017 CLINICAL DATA:  Fall today with left-sided pain. EXAM: CT CHEST, ABDOMEN, AND PELVIS WITH CONTRAST TECHNIQUE: Multidetector CT imaging of the chest, abdomen and pelvis was performed following the standard protocol during bolus administration of intravenous contrast. CONTRAST:  116mL ISOVUE-300 IOPAMIDOL (ISOVUE-300) INJECTION 61% COMPARISON:  CT chest 03/09/2012 and CT abdomen/pelvis 01/06/2017 FINDINGS: CT CHEST FINDINGS Cardiovascular: Heart is normal size. Mild calcified plaque over the descending thoracic aorta. Mild calcified plaque over the left main and proximal 3 vessel coronary arteries. Mediastinum/Nodes: No mediastinal or hilar adenopathy. Remaining mediastinal structures are within normal. Lungs/Pleura: Small left apical pneumothorax as well as evidence of small loculated pneumothorax over the anteromedial and anterior left base. Small amount left pleural fluid/hemorrhage with left basilar airspace opacification likely atelectasis. Posterior right basilar atelectasis. Airways are within normal. Musculoskeletal: Several bilateral anterolateral rib fractures. There are acute displaced fractures of the left ribs 5 through 12. CT ABDOMEN PELVIS FINDINGS Hepatobiliary: Previous cholecystectomy. Liver and biliary tree are within normal. Pancreas: Normal. Spleen: Normal. Adrenals/Urinary Tract: Adrenal glands are normal and symmetric. Kidneys are normal in size without hydronephrosis. 2 mm stone over the mid pole collecting system of the left kidney. Subcentimeter hypodensity over the mid pole cortex of the left kidney too small to characterize but likely a cyst. Visualized ureters and bladder are normal. Stomach/Bowel:  Stomach and small bowel are within normal. There is a midline ventral hernia just below the level of the umbilicus containing multiple loops of small bowel which are normal in caliber without adjacent inflammatory change or free fluid. Appendix is not well visualized. There is a surgical suture line over the right colon. Vascular/Lymphatic: Calcified plaque over the abdominal aorta and iliac arteries. No adenopathy. Reproductive: Within normal. Other: No significant free fluid or focal inflammatory change. No free peritoneal air. Musculoskeletal: Fracture along the right transverse process of L2 which is chronic as well as subtle fracture at the base of the right L2 transverse process which appears to be acute. Old right L3 transverse process fracture. Evidence of a iliopsoas bursitis anterior to the right hip. IMPRESSION: Multiple acute displaced left rib fractures 5 through 12. Small left pneumothorax/hemothorax. Bibasilar atelectasis. No acute intra-abdominal injury. Suggestion of subtle acute fracture along the base of the right L2 transverse process. Nonobstructing 2 mm left renal stone. Subcentimeter hypodensity left mid pole renal cortex too small to characterize but likely a cyst. Midline ventral hernia below the umbilicus containing several small bowel loops. No evidence of bowel obstruction. Aortic Atherosclerosis (ICD10-I70.0). Mild atherosclerotic coronary artery disease. Right hip iliopsoas bursitis. Critical Value/emergent results were called by telephone at the  time of interpretation on 10/30/2017 at 10:28 am to Dr. Conni Slipper, who verbally acknowledged these results. Electronically Signed   By: Marin Olp M.D.   On: 10/30/2017 10:28   Dg Shoulder Left  Result Date: 10/30/2017 CLINICAL DATA:  Shoulder pain EXAM: LEFT SHOULDER - 2+ VIEW COMPARISON:  None. FINDINGS: There is no evidence of fracture or dislocation. There is no evidence of arthropathy or other focal bone abnormality. Soft tissues  are unremarkable. IMPRESSION: Negative. Electronically Signed   By: Misty Stanley M.D.   On: 10/30/2017 09:42    Procedures Procedures (including critical care time)  Medications Ordered in ED Medications - No data to display   Initial Impression / Assessment and Plan / ED Course  I have reviewed the triage vital signs and the nursing notes.  Pertinent labs & imaging results that were available during my care of the patient were reviewed by me and considered in my medical decision making (see chart for details).    MDM  Screen complete  Patient is arriving as a trauma transfer from [facility].  Dr. Donne Hazel of the trauma service is aware of the case and will see the patient in the ED.  Final Clinical Impressions(s) / ED Diagnoses   Final diagnoses:  Closed fracture of multiple ribs of left side, initial encounter    ED Discharge Orders    None       Valarie Merino, MD 10/31/17 [phone]

## 2017-10-30 NOTE — ED Notes (Signed)
emtala reviewed by this RN 

## 2017-10-30 NOTE — ED Notes (Signed)
Patient's family requesting

## 2017-10-30 NOTE — H&P (Signed)
John Summers is an 84 y.o. male.   Chief Complaint: fall HPI:  53 yom tx from [facility] after fall today. He has a couple of falls recently and some changes since Namenda was started not long ago.  Family thinks this has made things worse for him. He complains of left sided chest pain. No sob.  Has recent treatment for uti.  Past Medical History:  Diagnosis Date  . Colon adenocarcinoma ([place])   . Complication of anesthesia    severe confusion  . GERD (gastroesophageal reflux disease)   . History of colon polyps   . History of gallstones   . Hx of transfusion of platelets   . Hypertension   . ITP (idiopathic thrombocytopenic purpura)    Dr. Grayland Ormond follows  . Kidney stones   . Lewy body dementia with behavioral disturbance   . Major depressive disorder, recurrent episode, in partial remission with anxious distress ([place])   . Pancreatitis 2/11  . Pollen allergies   . Skin cancer    ears  . Sleep apnea    "won't wear mask anymore" (12/22/2012)  . Unspecified essential hypertension     Past Surgical History:  Procedure Laterality Date  . CARDIAC CATHETERIZATION  2000's   "tx'd w/acid reflux RX" (12/22/2012)  . CHOLECYSTECTOMY  2011  . ESOPHAGEAL DILATION  1990's?  Marland Kitchen HERNIA REPAIR  12/22/2012   IHR w/mesh  . INGUINAL HERNIA REPAIR Left [id number]  . INSERTION OF MESH N/A 12/22/2012   Procedure: INSERTION OF MESH;  Surgeon: Harl Bowie, MD;  Location: [place];  Service: General;  Laterality: N/A;  . LITHOTRIPSY  2008   x2  . PARTIAL COLECTOMY  03/21/2012   Procedure: PARTIAL COLECTOMY;  Surgeon: Harl Bowie, MD;  Location: [place];  Service: General;  Laterality: Right;  . PILONIDAL CYST EXCISION  1960's?  Marland Kitchen SKIN CANCER EXCISION     "face; ?left ear" (12/22/2012)  . TONSILLECTOMY AND ADENOIDECTOMY  1946  . TOTAL HIP ARTHROPLASTY Left ~ 2002  . UPPER GASTROINTESTINAL ENDOSCOPY    . VENTRAL HERNIA REPAIR N/A 12/22/2012   Procedure: HERNIA REPAIR INCISIONAL ;  Surgeon:  Harl Bowie, MD;  Location: [place];  Service: General;  Laterality: N/A;    Family History  Problem Relation Age of Onset  . Colon cancer Mother   . Heart disease Mother   . Hypertension Mother   . Alcohol abuse Father   . Esophageal cancer Neg Hx   . Rectal cancer Neg Hx   . Stomach cancer Neg Hx    Social History:  reports that he has quit smoking. His smoking use included pipe. he has never used smokeless tobacco. He reports that he does not drink alcohol or use drugs.  Allergies:  Allergies  Allergen Reactions  . Aspirin Other (See Comments)  . Chicken Allergy Nausea And Vomiting    Unknown   . Other Nausea And Vomiting    [place] per daughter     Medications reviewed  Results for orders placed or performed during the hospital encounter of 10/30/17 (from the past 48 hour(s))  Urinalysis, Complete w Microscopic     Status: Abnormal   Collection Time: 10/30/17 10:04 AM  Result Value Ref Range   Color, Urine YELLOW (A) YELLOW   APPearance CLEAR (A) CLEAR   Specific Gravity, Urine 1.033 (H) 1.005 - 1.030   pH 6.0 5.0 - 8.0   Glucose, UA NEGATIVE NEGATIVE mg/dL   Hgb urine dipstick NEGATIVE NEGATIVE  Bilirubin Urine NEGATIVE NEGATIVE   Ketones, ur NEGATIVE NEGATIVE mg/dL   Protein, ur NEGATIVE NEGATIVE mg/dL   Nitrite NEGATIVE NEGATIVE   Leukocytes, UA NEGATIVE NEGATIVE   RBC / HPF 0-5 0 - 5 RBC/hpf   WBC, UA 0-5 0 - 5 WBC/hpf   Bacteria, UA NONE SEEN NONE SEEN   Squamous Epithelial / LPF NONE SEEN NONE SEEN   Mucus PRESENT    Hyaline Casts, UA PRESENT     Comment: Performed at [facility], [address] [place], [place] [address]  Basic metabolic panel     Status: Abnormal   Collection Time: 10/30/17 10:04 AM  Result Value Ref Range   Sodium 137 135 - 145 mmol/L   Potassium 4.2 3.5 - 5.1 mmol/L   Chloride 105 101 - 111 mmol/L   CO2 25 22 - 32 mmol/L   Glucose, Bld 109 (H) 65 - 99 mg/dL   BUN 19 6 - 20 mg/dL   Creatinine, Ser 0.97 0.61 -  1.24 mg/dL   Calcium 8.3 (L) 8.9 - 10.3 mg/dL   GFR calc non Af Amer >60 >60 mL/min   GFR calc Af Amer >60 >60 mL/min    Comment: (NOTE) The eGFR has been calculated using the CKD EPI equation. This calculation has not been validated in all clinical situations. eGFR's persistently <60 mL/min signify possible Chronic Kidney Disease.    Anion gap 7 5 - 15    Comment: Performed at [facility], [address] [place], [place] [address]  CBC with Differential     Status: Abnormal   Collection Time: 10/30/17 10:04 AM  Result Value Ref Range   WBC 2.2 (L) 3.8 - 10.6 K/uL   RBC 2.28 (L) 4.40 - 5.90 MIL/uL   Hemoglobin 8.5 (L) 13.0 - 18.0 g/dL   HCT 25.3 (L) 40.0 - 52.0 %   MCV 110.6 (H) 80.0 - 100.0 fL   MCH 37.0 (H) 26.0 - 34.0 pg   MCHC 33.5 32.0 - 36.0 g/dL   RDW 15.8 (H) 11.5 - 14.5 %   Platelets 99 (L) 150 - 440 K/uL   Neutrophils Relative % 53 %   Neutro Abs 1.2 (L) 1.4 - 6.5 K/uL   Lymphocytes Relative 26 %   Lymphs Abs 0.6 (L) 1.0 - 3.6 K/uL   Monocytes Relative 21 %   Monocytes Absolute 0.5 0.2 - 1.0 K/uL   Eosinophils Relative 0 %   Eosinophils Absolute 0.0 0 - 0.7 K/uL   Basophils Relative 0 %   Basophils Absolute 0.0 0 - 0.1 K/uL    Comment: Performed at [facility], [address] [place], [place] [address]  Troponin I     Status: None   Collection Time: 10/30/17 10:04 AM  Result Value Ref Range   Troponin I <0.03 <0.03 ng/mL    Comment: Performed at [facility], [address], [place], [place] [address]   Dg Ribs Unilateral W/chest Left  Result Date: 10/30/2017 CLINICAL DATA:  Left rib and shoulder pain after fall. EXAM: LEFT RIBS AND CHEST - 3+ VIEW COMPARISON:  09/17/2017 FINDINGS: Retrocardiac densities compatible with consolidation or focal volume loss. There are multiple displaced left rib fractures. Fractures involving the left fifth, sixth, seventh, eighth, ninth and tenth ribs. Possible fracture of the left eleventh rib.  Haziness at the left costophrenic angle is suggestive for atelectasis and probable small pleural effusion. Negative for a pneumothorax. Heart size is normal and within normal limits. Trachea  is midline. Left clavicle appears to be intact. No gross abnormality to the left shoulder. Old right lower rib fractures. IMPRESSION: Numerous displaced left rib fractures. Left basilar chest densities are suggestive for atelectasis and suspect small left pleural effusion. Electronically Signed   By: Markus Daft M.D.   On: 10/30/2017 09:50   Ct Head Wo Contrast  Result Date: 10/30/2017 CLINICAL DATA:  Fall, chest wall pain and upper abdominal pain. Bilateral arm pain. EXAM: CT HEAD WITHOUT CONTRAST CT CERVICAL SPINE WITHOUT CONTRAST TECHNIQUE: Multidetector CT imaging of the head and cervical spine was performed following the standard protocol without intravenous contrast. Multiplanar CT image reconstructions of the cervical spine were also generated. COMPARISON:  07/31/2017. FINDINGS: CT HEAD FINDINGS Brain: No evidence of an acute infarct, acute hemorrhage, mass lesion, mass effect or hydrocephalus. Atrophy. Periventricular low attenuation. Vascular: No hyperdense vessel or unexpected calcification. Skull: Normal. Negative for fracture or focal lesion. Sinuses/Orbits: No acute finding. Other: None. CT CERVICAL SPINE FINDINGS Alignment: Minimal grade 1 anterolisthesis of C7 on T1, likely degenerative. Alignment is otherwise anatomic. Skull base and vertebrae: No acute fracture. No primary bone lesion or focal pathologic process. Soft tissues and spinal canal: No prevertebral fluid or swelling. No visible canal hematoma. Disc levels:  At C1-2, marked degenerative change along the dens. At C3-4, moderate right neural foraminal narrowing with uncovertebral and bilateral facet hypertrophy. At C4-5, severe right neural foraminal narrowing with uncovertebral and bilateral facet hypertrophy. At C5-6, moderate right and mild left  neural foraminal narrowing with uncovertebral/bilateral facet hypertrophy and disc space narrowing. At C6-7, moderate right and mild left neural foraminal narrowing with uncovertebral/bilateral facet hypertrophy and disc space narrowing. Upper chest: Small left apical pneumothorax and small left pleural effusion, incompletely imaged. Other: None. IMPRESSION: 1. Small left apical hydropneumothorax, incompletely imaged. Recommend dedicated CT chest with contrast in further evaluation. Critical Value/emergent results were called by telephone at the time of interpretation on 10/30/2017 at 9:47 am to Dr. Lisa Roca , who verbally acknowledged these results. 2. Otherwise, no evidence of acute trauma in the head or cervical spine. 3. Atrophy and chronic microvascular white matter ischemic changes. 4. Multilevel degenerative disc disease and facet hypertrophy. Electronically Signed   By: Lorin Picket M.D.   On: 10/30/2017 09:47   Ct Chest W Contrast  Result Date: 10/30/2017 CLINICAL DATA:  Fall today with left-sided pain. EXAM: CT CHEST, ABDOMEN, AND PELVIS WITH CONTRAST TECHNIQUE: Multidetector CT imaging of the chest, abdomen and pelvis was performed following the standard protocol during bolus administration of intravenous contrast. CONTRAST:  175m ISOVUE-300 IOPAMIDOL (ISOVUE-300) INJECTION 61% COMPARISON:  CT chest 03/09/2012 and CT abdomen/pelvis 01/06/2017 FINDINGS: CT CHEST FINDINGS Cardiovascular: Heart is normal size. Mild calcified plaque over the descending thoracic aorta. Mild calcified plaque over the left main and proximal 3 vessel coronary arteries. Mediastinum/Nodes: No mediastinal or hilar adenopathy. Remaining mediastinal structures are within normal. Lungs/Pleura: Small left apical pneumothorax as well as evidence of small loculated pneumothorax over the anteromedial and anterior left base. Small amount left pleural fluid/hemorrhage with left basilar airspace opacification likely atelectasis.  Posterior right basilar atelectasis. Airways are within normal. Musculoskeletal: Several bilateral anterolateral rib fractures. There are acute displaced fractures of the left ribs 5 through 12. CT ABDOMEN PELVIS FINDINGS Hepatobiliary: Previous cholecystectomy. Liver and biliary tree are within normal. Pancreas: Normal. Spleen: Normal. Adrenals/Urinary Tract: Adrenal glands are normal and symmetric. Kidneys are normal in size without hydronephrosis. 2 mm stone over the mid pole collecting system of the  left kidney. Subcentimeter hypodensity over the mid pole cortex of the left kidney too small to characterize but likely a cyst. Visualized ureters and bladder are normal. Stomach/Bowel: Stomach and small bowel are within normal. There is a midline ventral hernia just below the level of the umbilicus containing multiple loops of small bowel which are normal in caliber without adjacent inflammatory change or free fluid. Appendix is not well visualized. There is a surgical suture line over the right colon. Vascular/Lymphatic: Calcified plaque over the abdominal aorta and iliac arteries. No adenopathy. Reproductive: Within normal. Other: No significant free fluid or focal inflammatory change. No free peritoneal air. Musculoskeletal: Fracture along the right transverse process of L2 which is chronic as well as subtle fracture at the base of the right L2 transverse process which appears to be acute. Old right L3 transverse process fracture. Evidence of a iliopsoas bursitis anterior to the right hip. IMPRESSION: Multiple acute displaced left rib fractures 5 through 12. Small left pneumothorax/hemothorax. Bibasilar atelectasis. No acute intra-abdominal injury. Suggestion of subtle acute fracture along the base of the right L2 transverse process. Nonobstructing 2 mm left renal stone. Subcentimeter hypodensity left mid pole renal cortex too small to characterize but likely a cyst. Midline ventral hernia below the umbilicus  containing several small bowel loops. No evidence of bowel obstruction. Aortic Atherosclerosis (ICD10-I70.0). Mild atherosclerotic coronary artery disease. Right hip iliopsoas bursitis. Critical Value/emergent results were called by telephone at the time of interpretation on 10/30/2017 at 10:28 am to Dr. Conni Slipper, who verbally acknowledged these results. Electronically Signed   By: Marin Olp M.D.   On: 10/30/2017 10:28   Ct Cervical Spine Wo Contrast  Result Date: 10/30/2017 CLINICAL DATA:  Fall, chest wall pain and upper abdominal pain. Bilateral arm pain. EXAM: CT HEAD WITHOUT CONTRAST CT CERVICAL SPINE WITHOUT CONTRAST TECHNIQUE: Multidetector CT imaging of the head and cervical spine was performed following the standard protocol without intravenous contrast. Multiplanar CT image reconstructions of the cervical spine were also generated. COMPARISON:  07/31/2017. FINDINGS: CT HEAD FINDINGS Brain: No evidence of an acute infarct, acute hemorrhage, mass lesion, mass effect or hydrocephalus. Atrophy. Periventricular low attenuation. Vascular: No hyperdense vessel or unexpected calcification. Skull: Normal. Negative for fracture or focal lesion. Sinuses/Orbits: No acute finding. Other: None. CT CERVICAL SPINE FINDINGS Alignment: Minimal grade 1 anterolisthesis of C7 on T1, likely degenerative. Alignment is otherwise anatomic. Skull base and vertebrae: No acute fracture. No primary bone lesion or focal pathologic process. Soft tissues and spinal canal: No prevertebral fluid or swelling. No visible canal hematoma. Disc levels:  At C1-2, marked degenerative change along the dens. At C3-4, moderate right neural foraminal narrowing with uncovertebral and bilateral facet hypertrophy. At C4-5, severe right neural foraminal narrowing with uncovertebral and bilateral facet hypertrophy. At C5-6, moderate right and mild left neural foraminal narrowing with uncovertebral/bilateral facet hypertrophy and disc space  narrowing. At C6-7, moderate right and mild left neural foraminal narrowing with uncovertebral/bilateral facet hypertrophy and disc space narrowing. Upper chest: Small left apical pneumothorax and small left pleural effusion, incompletely imaged. Other: None. IMPRESSION: 1. Small left apical hydropneumothorax, incompletely imaged. Recommend dedicated CT chest with contrast in further evaluation. Critical Value/emergent results were called by telephone at the time of interpretation on 10/30/2017 at 9:47 am to Dr. Lisa Roca , who verbally acknowledged these results. 2. Otherwise, no evidence of acute trauma in the head or cervical spine. 3. Atrophy and chronic microvascular white matter ischemic changes. 4. Multilevel degenerative disc disease and  facet hypertrophy. Electronically Signed   By: Lorin Picket M.D.   On: 10/30/2017 09:47   Ct Abdomen Pelvis W Contrast  Result Date: 10/30/2017 CLINICAL DATA:  Fall today with left-sided pain. EXAM: CT CHEST, ABDOMEN, AND PELVIS WITH CONTRAST TECHNIQUE: Multidetector CT imaging of the chest, abdomen and pelvis was performed following the standard protocol during bolus administration of intravenous contrast. CONTRAST:  176m ISOVUE-300 IOPAMIDOL (ISOVUE-300) INJECTION 61% COMPARISON:  CT chest 03/09/2012 and CT abdomen/pelvis 01/06/2017 FINDINGS: CT CHEST FINDINGS Cardiovascular: Heart is normal size. Mild calcified plaque over the descending thoracic aorta. Mild calcified plaque over the left main and proximal 3 vessel coronary arteries. Mediastinum/Nodes: No mediastinal or hilar adenopathy. Remaining mediastinal structures are within normal. Lungs/Pleura: Small left apical pneumothorax as well as evidence of small loculated pneumothorax over the anteromedial and anterior left base. Small amount left pleural fluid/hemorrhage with left basilar airspace opacification likely atelectasis. Posterior right basilar atelectasis. Airways are within normal. Musculoskeletal:  Several bilateral anterolateral rib fractures. There are acute displaced fractures of the left ribs 5 through 12. CT ABDOMEN PELVIS FINDINGS Hepatobiliary: Previous cholecystectomy. Liver and biliary tree are within normal. Pancreas: Normal. Spleen: Normal. Adrenals/Urinary Tract: Adrenal glands are normal and symmetric. Kidneys are normal in size without hydronephrosis. 2 mm stone over the mid pole collecting system of the left kidney. Subcentimeter hypodensity over the mid pole cortex of the left kidney too small to characterize but likely a cyst. Visualized ureters and bladder are normal. Stomach/Bowel: Stomach and small bowel are within normal. There is a midline ventral hernia just below the level of the umbilicus containing multiple loops of small bowel which are normal in caliber without adjacent inflammatory change or free fluid. Appendix is not well visualized. There is a surgical suture line over the right colon. Vascular/Lymphatic: Calcified plaque over the abdominal aorta and iliac arteries. No adenopathy. Reproductive: Within normal. Other: No significant free fluid or focal inflammatory change. No free peritoneal air. Musculoskeletal: Fracture along the right transverse process of L2 which is chronic as well as subtle fracture at the base of the right L2 transverse process which appears to be acute. Old right L3 transverse process fracture. Evidence of a iliopsoas bursitis anterior to the right hip. IMPRESSION: Multiple acute displaced left rib fractures 5 through 12. Small left pneumothorax/hemothorax. Bibasilar atelectasis. No acute intra-abdominal injury. Suggestion of subtle acute fracture along the base of the right L2 transverse process. Nonobstructing 2 mm left renal stone. Subcentimeter hypodensity left mid pole renal cortex too small to characterize but likely a cyst. Midline ventral hernia below the umbilicus containing several small bowel loops. No evidence of bowel obstruction. Aortic  Atherosclerosis (ICD10-I70.0). Mild atherosclerotic coronary artery disease. Right hip iliopsoas bursitis. Critical Value/emergent results were called by telephone at the time of interpretation on 10/30/2017 at 10:28 am to Dr. PConni Slipper who verbally acknowledged these results. Electronically Signed   By: DMarin OlpM.D.   On: 10/30/2017 10:28   Dg Shoulder Left  Result Date: 10/30/2017 CLINICAL DATA:  Shoulder pain EXAM: LEFT SHOULDER - 2+ VIEW COMPARISON:  None. FINDINGS: There is no evidence of fracture or dislocation. There is no evidence of arthropathy or other focal bone abnormality. Soft tissues are unremarkable. IMPRESSION: Negative. Electronically Signed   By: EMisty StanleyM.D.   On: 10/30/2017 09:42    Review of Systems  Cardiovascular: Positive for chest pain.  Musculoskeletal: Positive for falls.  All other systems reviewed and are negative.   Blood pressure 102/71, pulse  77, temperature 99.6 F (37.6 C), temperature source Oral, resp. rate 18, SpO2 95 %. Physical Exam  Vitals reviewed. Constitutional: He appears well-developed and well-nourished.  HENT:  Head: Normocephalic and atraumatic.  Right Ear: External ear normal.  Left Ear: External ear normal.  Mouth/Throat: Oropharynx is clear and moist.  Eyes: Pupils are equal, round, and reactive to light. No scleral icterus.  Neck: Normal range of motion. Neck supple.  Cardiovascular: Normal rate, regular rhythm, normal heart sounds and intact distal pulses.  Respiratory: Effort normal and breath sounds normal. He exhibits tenderness.  GI: Soft. Bowel sounds are normal. He exhibits no distension. There is no tenderness. A hernia is present. Hernia confirmed positive in the ventral area (incisional).    Musculoskeletal: He exhibits no tenderness.  Lymphadenopathy:    He has no cervical adenopathy.  Neurological: He is alert. Cranial nerve deficit: left lateral. GCS eye subscore is 4. GCS verbal subscore is 5. GCS motor  subscore is 6.  Skin: Skin is warm and dry. He is not diaphoretic.  Psychiatric: Cognition and memory are impaired.     Assessment/Plan Fall  Rib fractures- pulmonary toilet, pain control, repeat cxr in am due to small hptx Dementia- restart home meds except for namenda which family believes may be contributing Anemia- appears chronic, will recheck in am Possible Lumbar tp fx- pain control, work with therapies in am Lovenox, regular diet, scds  Rolm Bookbinder, MD 10/30/2017, 3:05 PM

## 2017-10-30 NOTE — ED Triage Notes (Signed)
Pt to ED via [facility] from [facility] for fall from Wheelchair per EMS pt was sitting in his wheelchair and fell forward. Pt c/o chest wall pain and upper abdominal pain on palpation. Pt also c/o bilateral arm pain. Pt in NAD at this time.

## 2017-10-30 NOTE — ED Notes (Signed)
[facility] at bedside 

## 2017-10-31 ENCOUNTER — Inpatient Hospital Stay (HOSPITAL_COMMUNITY): Payer: Medicare Other

## 2017-10-31 LAB — BASIC METABOLIC PANEL
Anion gap: 11 (ref 5–15)
BUN: 16 mg/dL (ref 6–20)
CALCIUM: 8.5 mg/dL — AB (ref 8.9–10.3)
CHLORIDE: 106 mmol/L (ref 101–111)
CO2: 21 mmol/L — AB (ref 22–32)
CREATININE: 0.84 mg/dL (ref 0.61–1.24)
GFR calc non Af Amer: >60 mL/min (ref >60)
GLUCOSE: 108 mg/dL — AB (ref 65–99)
Potassium: 4.1 mmol/L (ref 3.5–5.1)
Sodium: 138 mmol/L (ref 135–145)

## 2017-10-31 LAB — CBC
HEMATOCRIT: 25.6 % — AB (ref 39.0–52.0)
HEMOGLOBIN: 8.3 g/dL — AB (ref 13.0–17.0)
MCH: 35.8 pg — ABNORMAL HIGH (ref 26.0–34.0)
MCHC: 32.4 g/dL (ref 30.0–36.0)
MCV: 110.3 fL — AB (ref 78.0–100.0)
Platelets: 105 10*3/uL — ABNORMAL LOW (ref 150–400)
RBC: 2.32 MIL/uL — ABNORMAL LOW (ref 4.22–5.81)
RDW: 14.6 % (ref 11.5–15.5)
WBC: 2.6 10*3/uL — ABNORMAL LOW (ref 4.0–10.5)

## 2017-10-31 NOTE — Progress Notes (Signed)
OT Cancellation Note  Patient Details Name: John Summers MRN: [id number] DOB: [birth year]   Cancelled Treatment:    Reason Eval/Treat Not Completed: Other (comment): Attempted to see pt in conjunction with PT. Discussed pt status with RN who reports pt remains combative. Will check back next date to initiate OT evaluation.   Norman Herrlich, MS OTR/L  Pager: [phone]   Norman Herrlich 10/31/2017, 3:18 PM

## 2017-10-31 NOTE — Progress Notes (Signed)
Pt reaching for items in the air at the beginning of the shift today.  Gave the Pt Norco x2 this shift, and seems to be more relaxed through the day.
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
## 2017-10-31 NOTE — Progress Notes (Signed)
PT Cancellation Note  Patient Details Name: John Summers MRN: [id number] DOB: [birth year]   Cancelled Treatment:    Reason Eval/Treat Not Completed: Other (comment)   Discussed pt status with RN, who reports he continues to show combative behaviors;   Will hold off on PT eval for now, and check back tomorrow for readiness for PT eval;   Roney Marion, [place] Pager [phone] Office [phone]    [place] 10/31/2017, 3:11 PM
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
## 2017-11-01 LAB — POCT I-STAT CREATININE: CREATININE: 0.9 mg/dL (ref 0.61–1.24)

## 2017-11-01 MED ORDER — OXYCODONE HCL 5 MG PO TABS
2.5000 mg | ORAL_TABLET | ORAL | Status: DC | PRN
  Administered 2017-11-01 – 2017-11-05 (×5): 5 mg via ORAL
  Filled 2017-11-01 (×5): qty 1

## 2017-11-01 MED ORDER — TRAMADOL HCL 50 MG PO TABS
50.0000 mg | ORAL_TABLET | Freq: Four times a day (QID) | ORAL | Status: DC | PRN
  Administered 2017-11-02 – 2017-11-04 (×3): 50 mg via ORAL
  Filled 2017-11-01 (×3): qty 1

## 2017-11-01 MED ORDER — ACETAMINOPHEN 325 MG PO TABS
650.0000 mg | ORAL_TABLET | Freq: Four times a day (QID) | ORAL | Status: DC
  Administered 2017-11-01 – 2017-11-05 (×13): 650 mg via ORAL
  Filled 2017-11-01 (×13): qty 2

## 2017-11-01 NOTE — Progress Notes (Signed)
Pt combative upon attempt to connect him to IV fluids. Notified MD.

## 2017-11-01 NOTE — Evaluation (Signed)
Occupational Therapy Evaluation Patient Details Name: John Summers MRN: [id number] DOB: 06-03-1933 Today's Date: 11/01/2017    History of Present Illness 84 y.o. male admitted on 10/30/17 for fall with resultant L dised multiple rib fractures, lumbar transverse process fx.  Pt with significant PMH of Lewy body dementia at memory care SNF, essential HTN, and L THA.   Clinical Impression   PTA, pt was at ALF with assistance for ADL and aides present during daytime hours. Pt has been utilizing wheelchair there and working with physical therapy. Pt currently limited due to pain but willing to participate and benefits from encouragement and slow, calming tone when speaking with him. Additionally noted that pt demonstrates diminished peripheral vision and benefits greatly from caregivers/staff entering direct midline visual field prior to engaging with him. He currently requires max assist for dressing and bathing tasks secondary to pain. Feel that pt will recover best in familiar environment of ALF with 24/7 assistance and follow-up with home health OT. OT will continue to follow while admitted.     Follow Up Recommendations  Supervision/Assistance - 24 hour;Home health OT(HHOT at ALF with 24/7 assist)    Equipment Recommendations  Other (comment)(TBD at next venue)    Recommendations for Other Services       Precautions / Restrictions Precautions Precautions: Fall Restrictions Weight Bearing Restrictions: No      Mobility Bed Mobility Overal bed mobility: Needs Assistance Bed Mobility: Supine to Sit;Sit to Sidelying     Supine to sit: Min assist;HOB elevated;+2 for physical assistance   Sit to sidelying: +2 for physical assistance;Mod assist;HOB elevated General bed mobility comments: Pt needed extra time and encouragement as well as hand over hand assist to find rails during transitions.  If given extra time and a chance for him to initiate his own movments, he was min assist.   Mod assist to return to supine due to assisting at trunk and legs.  Significantly extra time needed for transitions to gain trust and rapport as well as help control pain.   Transfers Overall transfer level: Needs assistance Equipment used: 2 person hand held assist Transfers: Sit to/from Stand Sit to Stand: Min assist;+2 physical assistance         General transfer comment: Two person min hand held assist to stand x 2, pt stood for 30-45 seconds each time before sitting down, but we were unable to progress to gait either due to pain or cognitive deficits.      Balance Overall balance assessment: Needs assistance Sitting-balance support: Feet supported;Single extremity supported Sitting balance-Leahy Scale: Fair Sitting balance - Comments: supervision EOB with one hand on rail for support.    Standing balance support: Bilateral upper extremity supported Standing balance-Leahy Scale: Poor Standing balance comment: two person light hand held assist in standing, unable to get pt to march in place or take steps forward before he sat down again.                            ADL either performed or assessed with clinical judgement   ADL Overall ADL's : Needs assistance/impaired Eating/Feeding: Minimal assistance;Bed level   Grooming: Moderate assistance;Sitting Grooming Details (indicate cue type and reason): Pain when raising B UE over head Upper Body Bathing: Maximal assistance;Sitting   Lower Body Bathing: Maximal assistance;Sit to/from stand   Upper Body Dressing : Maximal assistance;Sitting   Lower Body Dressing: Maximal assistance;Sit to/from Health and safety inspector  Details (indicate cue type and reason): Pt able to complete sit<>stand with min assist +2 but unable to take steps due to pain.  Toileting- Clothing Manipulation and Hygiene: Maximal assistance;Sit to/from stand         General ADL Comments: Pt benefits from slow, calm tone when speaking and being  directly in his visual fields. Limited peripheral vision.      Vision   Additional Comments: Will continue assessment. Closing eyes throughout the majority of session. Peripheral vision (especially to the R) appears limited. Pt will frequently stare off into environment requiring therapist to be in visual field to regain attention.      Perception     Praxis      Pertinent Vitals/Pain Pain Assessment: Faces Faces Pain Scale: Hurts whole lot Pain Location: back and left ribs Pain Descriptors / Indicators: Grimacing;Guarding Pain Intervention(s): Limited activity within patient's tolerance;Monitored during session;Repositioned     Hand Dominance     Extremity/Trunk Assessment Upper Extremity Assessment Upper Extremity Assessment: Generalized weakness   Lower Extremity Assessment Lower Extremity Assessment: Defer to PT evaluation   Cervical / Trunk Assessment Cervical / Trunk Assessment: Kyphotic(and painful with mobility due to transverse process fx)   Communication Communication Communication: No difficulties   Cognition Arousal/Alertness: Awake/alert Behavior During Therapy: WFL for tasks assessed/performed Overall Cognitive Status: History of cognitive impairments - at baseline                                 General Comments: Pt with advanced dementia. [name], his step daughter reports his current cognition is closer to normal than when he was first admitted.    General Comments  Pt will demonstrate shaking in extremities when overwhelmed with pain or when he becomes upset. Per [name], step-daughter, this is typical of pt when in pain.     Exercises     Shoulder Instructions      Home Living Family/patient expects to be discharged to:: Other (Comment)(memory care unit)                                 Additional Comments: Per pt and step daughter [name], he was mostly WC transfers for safety, but was actively working with PT and they  were assisting with his walking.       Prior Functioning/Environment Level of Independence: Needs assistance  Gait / Transfers Assistance Needed: Pt has stopped ambulating except for when assisted and is now mostly in his [facility] for mobility unless working with therapy.  ADL's / Homemaking Assistance Needed: Has personal care attendants during all awake hours. Assistance needed for ADL. ALF assists with meals, meds, etc.             OT Problem List: Decreased strength;Decreased range of motion;Decreased activity tolerance;Impaired balance (sitting and/or standing);Decreased safety awareness;Decreased knowledge of use of DME or AE;Decreased knowledge of precautions;Pain      OT Treatment/Interventions: Self-care/ADL training;Therapeutic exercise;Energy conservation;DME and/or AE instruction;Therapeutic activities;Cognitive remediation/compensation;Patient/family education;Balance training    OT Goals(Current goals can be found in the care plan section) Acute Rehab OT Goals Patient Stated Goal: to decrease pain OT Goal Formulation: With patient/family Time For Goal Achievement: 11/15/17 Potential to Achieve Goals: Good ADL Goals Pt Will Perform Eating: with set-up;sitting Pt Will Perform Grooming: with set-up;sitting Pt Will Perform Lower Body Dressing: with mod assist;sit to/from stand Pt Will Transfer to  Toilet: with min assist;ambulating;bedside commode(BSC over toilet) Pt Will Perform Toileting - Clothing Manipulation and hygiene: with mod assist;sit to/from stand  OT Frequency: Min 2X/week   Barriers to D/C:            Co-evaluation PT/OT/SLP Co-Evaluation/Treatment: Yes Reason for Co-Treatment: Complexity of the patient's impairments (multi-system involvement);Necessary to address cognition/behavior during functional activity;For patient/therapist safety;To address functional/ADL transfers PT goals addressed during session: Mobility/safety with  mobility;Balance;Strengthening/ROM OT goals addressed during session: ADL's and self-care;Strengthening/ROM      AM-PAC PT "6 Clicks" Daily Activity     Outcome Measure Help from another person eating meals?: A Little Help from another person taking care of personal grooming?: A Lot Help from another person toileting, which includes using toliet, bedpan, or urinal?: A Lot Help from another person bathing (including washing, rinsing, drying)?: A Lot Help from another person to put on and taking off regular upper body clothing?: A Lot Help from another person to put on and taking off regular lower body clothing?: A Lot 6 Click Score: 13   End of Session Nurse Communication: Mobility status  Activity Tolerance: Patient tolerated treatment well Patient left: in bed;with call bell/phone within reach;with bed alarm set  OT Visit Diagnosis: Other abnormalities of gait and mobility (R26.89);Pain;Other symptoms and signs involving cognitive function Pain - part of body: (back)                Time: 4492-0100 OT Time Calculation (min): 37 min Charges:  OT General Charges $OT Visit: 1 Visit OT Evaluation $OT Eval Moderate Complexity: 1 Mod G-Codes:     Norman Herrlich, MS OTR/L  Pager: [place] A Keyontae Hucke[place] 11/01/2017, 5:52 PM

## 2017-11-01 NOTE — Evaluation (Signed)
Physical Therapy Evaluation Patient Details Name: John Summers MRN: [id number] DOB: [birth year] Today's Date: 11/01/2017   History of Present Illness  84 y.o. male admitted on 10/30/17 for fall with resultant L dised multiple rib fractures, lumbar transverse process fx.  Pt with significant PMH of Lewy body dementia at memory care SNF, essential HTN, and L THA.  Clinical Impression  Pt needed significantly extra time for EOB and standing mobility due to pain and baseline dementia.  He did well with slow steady encouragement.  He would do best returning to ALF (familiar environment) with round the clock aids (step daughter reports they were thinking about increasing his aides to 24/7 as they had them all day, but he started getting up at night too) and PT f/u at his memory care unit.  PT will follow acutely while he is here in the hospital.    Follow Up Recommendations Home health PT;Supervision for mobility/OOB;Other (comment)(return to ALF with therapy follow up.  )    Equipment Recommendations  None recommended by PT    Recommendations for Other Services    NA    Precautions / Restrictions Precautions Precautions: Fall      Mobility  Bed Mobility Overal bed mobility: Needs Assistance Bed Mobility: Supine to Sit;Sit to Sidelying     Supine to sit: Min assist;HOB elevated;+2 for physical assistance   Sit to sidelying: +2 for physical assistance;Mod assist;HOB elevated General bed mobility comments: Pt needed extra time and encouragement as well as hand over hand assist to find rails during transitions.  If given extra time and a chance for him to initiate his own movments, he was min assist.  Mod assist to return to supine due to assisting at trunk and legs.  Significantly extra time needed for transitions to gain trust and rapport as well as help control pain.   Transfers Overall transfer level: Needs assistance Equipment used: 2 person hand held assist Transfers: Sit  to/from Stand Sit to Stand: Min assist;+2 physical assistance         General transfer comment: Two person min hand held assist to stand x 2, pt stood for 30-45 seconds each time before sitting down, but we were unable to progress to gait either due to pain or cognitive deficits.    Ambulation/Gait             General Gait Details: unable at this time, likely due to pain and fear.  He seemed like he could physically do it with assist, but was not willing to try likely due to pain.         Balance Overall balance assessment: Needs assistance Sitting-balance support: Feet supported;Single extremity supported Sitting balance-Leahy Scale: Fair Sitting balance - Comments: supervision EOB with one hand on rail for support.    Standing balance support: Bilateral upper extremity supported Standing balance-Leahy Scale: Poor Standing balance comment: two person light hand held assist in standing, unable to get pt to march in place or take steps forward before he sat down again.                              Pertinent Vitals/Pain Pain Assessment: Faces Faces Pain Scale: Hurts whole lot Pain Location: back and left ribs Pain Descriptors / Indicators: Grimacing;Guarding Pain Intervention(s): Limited activity within patient's tolerance;Monitored during session;Repositioned    Home Living Family/patient expects to be discharged to:: Other (Comment)(memory care unit)  Additional Comments: Per pt and step daughter [name], he was mostly WC transfers for safety, but was actively working with PT and they were assisting with his walking.     Prior Function Level of Independence: Needs assistance   Gait / Transfers Assistance Needed: Pt has stopped ambulating except for when assisted and is now mostly in his [facility] for mobility unless working with therapy.               Extremity/Trunk Assessment   Upper Extremity Assessment Upper Extremity Assessment:  Defer to OT evaluation    Lower Extremity Assessment Lower Extremity Assessment: Generalized weakness    Cervical / Trunk Assessment Cervical / Trunk Assessment: Kyphotic(and painful with mobility due to TP fx)  Communication   Communication: No difficulties  Cognition Arousal/Alertness: Awake/alert Behavior During Therapy: WFL for tasks assessed/performed Overall Cognitive Status: History of cognitive impairments - at baseline                                 General Comments: Pt with advanced dementia Kristi, his step daughter reports his current cognition is closer to normal than when he was first admitted.              Assessment/Plan    PT Assessment Patient needs continued PT services  PT Problem List Decreased strength;Decreased activity tolerance;Decreased balance;Decreased mobility;Decreased cognition;Decreased knowledge of use of DME;Decreased safety awareness;Decreased knowledge of precautions;Impaired sensation;Pain       PT Treatment Interventions Gait training;Stair training;Functional mobility training;Therapeutic activities;Therapeutic exercise;Balance training;Cognitive remediation;Patient/family education    PT Goals (Current goals can be found in the Care Plan section)  Acute Rehab PT Goals Patient Stated Goal: to decrease pain PT Goal Formulation: With patient/family Time For Goal Achievement: 11/15/17 Potential to Achieve Goals: Good    Frequency Min 3X/week        Co-evaluation PT/OT/SLP Co-Evaluation/Treatment: Yes Reason for Co-Treatment: Complexity of the patient's impairments (multi-system involvement);Necessary to address cognition/behavior during functional activity;For patient/therapist safety;To address functional/ADL transfers PT goals addressed during session: Mobility/safety with mobility;Balance;Strengthening/ROM         AM-PAC PT "6 Clicks" Daily Activity  Outcome Measure Difficulty turning over in bed (including  adjusting bedclothes, sheets and blankets)?: Unable Difficulty moving from lying on back to sitting on the side of the bed? : Unable Difficulty sitting down on and standing up from a chair with arms (e.g., wheelchair, bedside commode, etc,.)?: Unable Help needed moving to and from a bed to chair (including a wheelchair)?: A Little Help needed walking in hospital room?: A Lot Help needed climbing 3-5 steps with a railing? : Total 6 Click Score: 9    End of Session   Activity Tolerance: Patient limited by pain Patient left: in bed;with call bell/phone within reach;with bed alarm set   PT Visit Diagnosis: Muscle weakness (generalized) (M62.81);Difficulty in walking, not elsewhere classified (R26.2);Pain Pain - Right/Left: Left Pain - part of body: (ribs and low back)    Time: 2595-6387 PT Time Calculation (min) (ACUTE ONLY): 36 min   Charges:         Wells Guiles B. Eisha Chatterjee, PT, DPT [phone]   PT Evaluation $PT Eval Moderate Complexity: 1 Mod     11/01/2017, 5:02 PM

## 2017-11-01 NOTE — Progress Notes (Signed)
[facility] Progress Note     Subjective: CC: no complaints Patient resting this AM. Private sitter at bedside. Per report patient was having some pain yesterday but pain medication was controlling. Was able to sleep overnight. Patient lives at [facility] facility in [place], [place]. Was pretty mobile prior to this. Had not been using IS. VSS.   Objective: Vital signs in last 24 hours: Temp:  [98.2 F (36.8 C)-98.9 F (37.2 C)] 98.7 F (37.1 C) (02/18 0521) Pulse Rate:  [73-112] 73 (02/18 0521) Resp:  [17-18] 18 (02/18 0521) BP: (106-158)/(60-138) 111/78 (02/18 0521) SpO2:  [93 %-99 %] 93 % (02/18 0159)    Intake/Output from previous day: 02/17 0701 - 02/18 0700 In: 150 [P.O.:150] Out: 200 [Urine:200] Intake/Output this shift: No intake/output data recorded.  PE: Gen:  resting, NAD,  Card:  Regular rate and rhythm, pedal pulses 2+ BL Pulm:  Normal effort, clear to auscultation bilaterally Abd: Soft, non-tender, non-distended, bowel sounds present  Skin: warm and dry, no rashes   Lab Results:  Recent Labs    10/30/17 1004 10/31/17 0343  WBC 2.2* 2.6*  HGB 8.5* 8.3*  HCT 25.3* 25.6*  PLT 99* 105*   BMET Recent Labs    10/30/17 1004 10/31/17 0343  NA 137 138  K 4.2 4.1  CL 105 106  CO2 25 21*  GLUCOSE 109* 108*  BUN 19 16  CREATININE 0.97 0.84  CALCIUM 8.3* 8.5*   PT/INR No results for input(s): LABPROT, INR in the last 72 hours. CMP     Component Value Date/Time   NA 138 10/31/2017 0343   NA 146 02/09/2017   NA 144 10/10/2014 1051   K 4.1 10/31/2017 0343   K 3.9 10/10/2014 1051   CL 106 10/31/2017 0343   CL 106 10/10/2014 1051   CO2 21 (L) 10/31/2017 0343   CO2 30 10/10/2014 1051   GLUCOSE 108 (H) 10/31/2017 0343   GLUCOSE 133 (H) 10/10/2014 1051   BUN 16 10/31/2017 0343   BUN 20 02/09/2017   BUN 23 (H) 10/10/2014 1051   CREATININE 0.84 10/31/2017 0343   CREATININE 1.17 10/10/2014 1051   CALCIUM 8.5 (L) 10/31/2017 0343   CALCIUM 8.4  (L) 10/10/2014 1051   PROT 7.3 07/21/2017 2047   ALBUMIN 3.5 07/21/2017 2047   AST 22 07/21/2017 2047   ALT 12 (L) 07/21/2017 2047   ALKPHOS 77 07/21/2017 2047   BILITOT 0.5 07/21/2017 2047   GFRNONAA >60 10/31/2017 0343   GFRNONAA >60 10/10/2014 1051   GFRNONAA 59 (L) 04/02/2014 1045   GFRAA >60 10/31/2017 0343   GFRAA >60 10/10/2014 1051   GFRAA >60 04/02/2014 1045   Lipase     Component Value Date/Time   LIPASE 92 (H) 07/21/2017 2047       Studies/Results: Dg Ribs Unilateral W/chest Left  Result Date: 10/30/2017 CLINICAL DATA:  Left rib and shoulder pain after fall. EXAM: LEFT RIBS AND CHEST - 3+ VIEW COMPARISON:  09/17/2017 FINDINGS: Retrocardiac densities compatible with consolidation or focal volume loss. There are multiple displaced left rib fractures. Fractures involving the left fifth, sixth, seventh, eighth, ninth and tenth ribs. Possible fracture of the left eleventh rib. Haziness at the left costophrenic angle is suggestive for atelectasis and probable small pleural effusion. Negative for a pneumothorax. Heart size is normal and within normal limits. Trachea is midline. Left clavicle appears to be intact. No gross abnormality to the left shoulder. Old right lower rib fractures. IMPRESSION: Numerous displaced left rib  fractures. Left basilar chest densities are suggestive for atelectasis and suspect small left pleural effusion. Electronically Signed   By: Markus Daft M.D.   On: 10/30/2017 09:50   Ct Head Wo Contrast  Result Date: 10/30/2017 CLINICAL DATA:  Fall, chest wall pain and upper abdominal pain. Bilateral arm pain. EXAM: CT HEAD WITHOUT CONTRAST CT CERVICAL SPINE WITHOUT CONTRAST TECHNIQUE: Multidetector CT imaging of the head and cervical spine was performed following the standard protocol without intravenous contrast. Multiplanar CT image reconstructions of the cervical spine were also generated. COMPARISON:  07/31/2017. FINDINGS: CT HEAD FINDINGS Brain: No evidence  of an acute infarct, acute hemorrhage, mass lesion, mass effect or hydrocephalus. Atrophy. Periventricular low attenuation. Vascular: No hyperdense vessel or unexpected calcification. Skull: Normal. Negative for fracture or focal lesion. Sinuses/Orbits: No acute finding. Other: None. CT CERVICAL SPINE FINDINGS Alignment: Minimal grade 1 anterolisthesis of C7 on T1, likely degenerative. Alignment is otherwise anatomic. Skull base and vertebrae: No acute fracture. No primary bone lesion or focal pathologic process. Soft tissues and spinal canal: No prevertebral fluid or swelling. No visible canal hematoma. Disc levels:  At C1-2, marked degenerative change along the dens. At C3-4, moderate right neural foraminal narrowing with uncovertebral and bilateral facet hypertrophy. At C4-5, severe right neural foraminal narrowing with uncovertebral and bilateral facet hypertrophy. At C5-6, moderate right and mild left neural foraminal narrowing with uncovertebral/bilateral facet hypertrophy and disc space narrowing. At C6-7, moderate right and mild left neural foraminal narrowing with uncovertebral/bilateral facet hypertrophy and disc space narrowing. Upper chest: Small left apical pneumothorax and small left pleural effusion, incompletely imaged. Other: None. IMPRESSION: 1. Small left apical hydropneumothorax, incompletely imaged. Recommend dedicated CT chest with contrast in further evaluation. Critical Value/emergent results were called by telephone at the time of interpretation on 10/30/2017 at 9:47 am to Dr. Lisa Roca , who verbally acknowledged these results. 2. Otherwise, no evidence of acute trauma in the head or cervical spine. 3. Atrophy and chronic microvascular white matter ischemic changes. 4. Multilevel degenerative disc disease and facet hypertrophy. Electronically Signed   By: Lorin Picket M.D.   On: 10/30/2017 09:47   Ct Chest W Contrast  Result Date: 10/30/2017 CLINICAL DATA:  Fall today with  left-sided pain. EXAM: CT CHEST, ABDOMEN, AND PELVIS WITH CONTRAST TECHNIQUE: Multidetector CT imaging of the chest, abdomen and pelvis was performed following the standard protocol during bolus administration of intravenous contrast. CONTRAST:  116mL ISOVUE-300 IOPAMIDOL (ISOVUE-300) INJECTION 61% COMPARISON:  CT chest 03/09/2012 and CT abdomen/pelvis 01/06/2017 FINDINGS: CT CHEST FINDINGS Cardiovascular: Heart is normal size. Mild calcified plaque over the descending thoracic aorta. Mild calcified plaque over the left main and proximal 3 vessel coronary arteries. Mediastinum/Nodes: No mediastinal or hilar adenopathy. Remaining mediastinal structures are within normal. Lungs/Pleura: Small left apical pneumothorax as well as evidence of small loculated pneumothorax over the anteromedial and anterior left base. Small amount left pleural fluid/hemorrhage with left basilar airspace opacification likely atelectasis. Posterior right basilar atelectasis. Airways are within normal. Musculoskeletal: Several bilateral anterolateral rib fractures. There are acute displaced fractures of the left ribs 5 through 12. CT ABDOMEN PELVIS FINDINGS Hepatobiliary: Previous cholecystectomy. Liver and biliary tree are within normal. Pancreas: Normal. Spleen: Normal. Adrenals/Urinary Tract: Adrenal glands are normal and symmetric. Kidneys are normal in size without hydronephrosis. 2 mm stone over the mid pole collecting system of the left kidney. Subcentimeter hypodensity over the mid pole cortex of the left kidney too small to characterize but likely a cyst. Visualized ureters and bladder  are normal. Stomach/Bowel: Stomach and small bowel are within normal. There is a midline ventral hernia just below the level of the umbilicus containing multiple loops of small bowel which are normal in caliber without adjacent inflammatory change or free fluid. Appendix is not well visualized. There is a surgical suture line over the right colon.  Vascular/Lymphatic: Calcified plaque over the abdominal aorta and iliac arteries. No adenopathy. Reproductive: Within normal. Other: No significant free fluid or focal inflammatory change. No free peritoneal air. Musculoskeletal: Fracture along the right transverse process of L2 which is chronic as well as subtle fracture at the base of the right L2 transverse process which appears to be acute. Old right L3 transverse process fracture. Evidence of a iliopsoas bursitis anterior to the right hip. IMPRESSION: Multiple acute displaced left rib fractures 5 through 12. Small left pneumothorax/hemothorax. Bibasilar atelectasis. No acute intra-abdominal injury. Suggestion of subtle acute fracture along the base of the right L2 transverse process. Nonobstructing 2 mm left renal stone. Subcentimeter hypodensity left mid pole renal cortex too small to characterize but likely a cyst. Midline ventral hernia below the umbilicus containing several small bowel loops. No evidence of bowel obstruction. Aortic Atherosclerosis (ICD10-I70.0). Mild atherosclerotic coronary artery disease. Right hip iliopsoas bursitis. Critical Value/emergent results were called by telephone at the time of interpretation on 10/30/2017 at 10:28 am to Dr. Conni Slipper, who verbally acknowledged these results. Electronically Signed   By: Marin Olp M.D.   On: 10/30/2017 10:28   Ct Cervical Spine Wo Contrast  Result Date: 10/30/2017 CLINICAL DATA:  Fall, chest wall pain and upper abdominal pain. Bilateral arm pain. EXAM: CT HEAD WITHOUT CONTRAST CT CERVICAL SPINE WITHOUT CONTRAST TECHNIQUE: Multidetector CT imaging of the head and cervical spine was performed following the standard protocol without intravenous contrast. Multiplanar CT image reconstructions of the cervical spine were also generated. COMPARISON:  07/31/2017. FINDINGS: CT HEAD FINDINGS Brain: No evidence of an acute infarct, acute hemorrhage, mass lesion, mass effect or hydrocephalus.  Atrophy. Periventricular low attenuation. Vascular: No hyperdense vessel or unexpected calcification. Skull: Normal. Negative for fracture or focal lesion. Sinuses/Orbits: No acute finding. Other: None. CT CERVICAL SPINE FINDINGS Alignment: Minimal grade 1 anterolisthesis of C7 on T1, likely degenerative. Alignment is otherwise anatomic. Skull base and vertebrae: No acute fracture. No primary bone lesion or focal pathologic process. Soft tissues and spinal canal: No prevertebral fluid or swelling. No visible canal hematoma. Disc levels:  At C1-2, marked degenerative change along the dens. At C3-4, moderate right neural foraminal narrowing with uncovertebral and bilateral facet hypertrophy. At C4-5, severe right neural foraminal narrowing with uncovertebral and bilateral facet hypertrophy. At C5-6, moderate right and mild left neural foraminal narrowing with uncovertebral/bilateral facet hypertrophy and disc space narrowing. At C6-7, moderate right and mild left neural foraminal narrowing with uncovertebral/bilateral facet hypertrophy and disc space narrowing. Upper chest: Small left apical pneumothorax and small left pleural effusion, incompletely imaged. Other: None. IMPRESSION: 1. Small left apical hydropneumothorax, incompletely imaged. Recommend dedicated CT chest with contrast in further evaluation. Critical Value/emergent results were called by telephone at the time of interpretation on 10/30/2017 at 9:47 am to Dr. Lisa Roca , who verbally acknowledged these results. 2. Otherwise, no evidence of acute trauma in the head or cervical spine. 3. Atrophy and chronic microvascular white matter ischemic changes. 4. Multilevel degenerative disc disease and facet hypertrophy. Electronically Signed   By: Lorin Picket M.D.   On: 10/30/2017 09:47   Ct Abdomen Pelvis W Contrast  Result  Date: 10/30/2017 CLINICAL DATA:  Fall today with left-sided pain. EXAM: CT CHEST, ABDOMEN, AND PELVIS WITH CONTRAST TECHNIQUE:  Multidetector CT imaging of the chest, abdomen and pelvis was performed following the standard protocol during bolus administration of intravenous contrast. CONTRAST:  136mL ISOVUE-300 IOPAMIDOL (ISOVUE-300) INJECTION 61% COMPARISON:  CT chest 03/09/2012 and CT abdomen/pelvis 01/06/2017 FINDINGS: CT CHEST FINDINGS Cardiovascular: Heart is normal size. Mild calcified plaque over the descending thoracic aorta. Mild calcified plaque over the left main and proximal 3 vessel coronary arteries. Mediastinum/Nodes: No mediastinal or hilar adenopathy. Remaining mediastinal structures are within normal. Lungs/Pleura: Small left apical pneumothorax as well as evidence of small loculated pneumothorax over the anteromedial and anterior left base. Small amount left pleural fluid/hemorrhage with left basilar airspace opacification likely atelectasis. Posterior right basilar atelectasis. Airways are within normal. Musculoskeletal: Several bilateral anterolateral rib fractures. There are acute displaced fractures of the left ribs 5 through 12. CT ABDOMEN PELVIS FINDINGS Hepatobiliary: Previous cholecystectomy. Liver and biliary tree are within normal. Pancreas: Normal. Spleen: Normal. Adrenals/Urinary Tract: Adrenal glands are normal and symmetric. Kidneys are normal in size without hydronephrosis. 2 mm stone over the mid pole collecting system of the left kidney. Subcentimeter hypodensity over the mid pole cortex of the left kidney too small to characterize but likely a cyst. Visualized ureters and bladder are normal. Stomach/Bowel: Stomach and small bowel are within normal. There is a midline ventral hernia just below the level of the umbilicus containing multiple loops of small bowel which are normal in caliber without adjacent inflammatory change or free fluid. Appendix is not well visualized. There is a surgical suture line over the right colon. Vascular/Lymphatic: Calcified plaque over the abdominal aorta and iliac arteries.  No adenopathy. Reproductive: Within normal. Other: No significant free fluid or focal inflammatory change. No free peritoneal air. Musculoskeletal: Fracture along the right transverse process of L2 which is chronic as well as subtle fracture at the base of the right L2 transverse process which appears to be acute. Old right L3 transverse process fracture. Evidence of a iliopsoas bursitis anterior to the right hip. IMPRESSION: Multiple acute displaced left rib fractures 5 through 12. Small left pneumothorax/hemothorax. Bibasilar atelectasis. No acute intra-abdominal injury. Suggestion of subtle acute fracture along the base of the right L2 transverse process. Nonobstructing 2 mm left renal stone. Subcentimeter hypodensity left mid pole renal cortex too small to characterize but likely a cyst. Midline ventral hernia below the umbilicus containing several small bowel loops. No evidence of bowel obstruction. Aortic Atherosclerosis (ICD10-I70.0). Mild atherosclerotic coronary artery disease. Right hip iliopsoas bursitis. Critical Value/emergent results were called by telephone at the time of interpretation on 10/30/2017 at 10:28 am to Dr. Conni Slipper, who verbally acknowledged these results. Electronically Signed   By: Marin Olp M.D.   On: 10/30/2017 10:28   Dg Chest Port 1 View  Result Date: 10/31/2017 CLINICAL DATA:  Pneumothorax. EXAM: PORTABLE CHEST 1 VIEW COMPARISON:  CT scan 10/30/2017 FINDINGS: The tiny left-sided pneumothorax seen on the CT scan from yesterday is not evident on today's x-ray. Prominent skin folds overlie the left mid lung. There is a tiny left pleural effusion with left basilar atelectasis. Asymmetric elevation right hemidiaphragm again noted. The cardiopericardial silhouette is within normal limits for size. The visualized bony structures of the thorax are intact. IMPRESSION: No evidence for pneumothorax on today's x-ray. Left base atelectasis with small left pleural effusion.  Electronically Signed   By: Misty Stanley M.D.   On: 10/31/2017 08:56  Dg Shoulder Left  Result Date: 10/30/2017 CLINICAL DATA:  Shoulder pain EXAM: LEFT SHOULDER - 2+ VIEW COMPARISON:  None. FINDINGS: There is no evidence of fracture or dislocation. There is no evidence of arthropathy or other focal bone abnormality. Soft tissues are unremarkable. IMPRESSION: Negative. Electronically Signed   By: Misty Stanley M.D.   On: 10/30/2017 09:42    Anti-infectives: Anti-infectives (From admission, onward)   None       Assessment/Plan Fall Multiple rib fractures of the left side with small HPTX - repeat CXR 2/17 without ptx, left base atelectasis - IS, pulm toilet - pain control - will schedule tylenol to try to minimize narcotics ?Lumbar TP fracture - PT/OT, pain control Dementia - home meds, except Namenda per family request  Anemia - likely chronic, Hgb 8.3 and stable, VSS  FEN: regular diet VTE: SCDs, lovenox ID: no abx indicated   Dispo: PT/OT. Hopefully back to SNF later today or tomorrow. Encourage IS.    LOS: 2 days    Brigid Re , [facility] 11/01/2017, 8:20 AM Pager: [phone] Trauma Pager: [phone] Mon-Fri 7:00 am-4:30 pm Sat-Sun 7:00 am-11:30 am

## 2017-11-01 NOTE — Clinical Social Work Note (Signed)
Clinical Social Work Assessment  Patient Details  Name: John Summers MRN: [id number] Date of Birth: July 09, 1933  Date of referral:  11/01/17               Reason for consult:  Facility Placement, Trauma                Permission sought to share information with:  Family Supports Permission granted to share information::     Name::     Edd Arbour  Relationship::  Daughter  Contact Information:  [phone]  Housing/Transportation Living arrangements for the past 2 months:  Assisted Living Facility([facility]) Source of Information:  Adult Children Patient Interpreter Needed:  None Criminal Activity/Legal Involvement Pertinent to Current Situation/Hospitalization:  No - Comment as needed Significant Relationships:  Adult Children, Community Support Lives with:  Facility Resident, Other (Comment)(Daytime Caregivers) Do you feel safe going back to the place where you live?  Yes Need for family participation in patient care:  Yes (Comment)  Care giving concerns:  Patient daughter states that patient has been to [facility] in the past and family prefers not to return.  Patient daughter also states that patient has caregivers during the day but the falls happen at night when patient is alone.   Social Worker assessment / plan:  Holiday representative spoke with patient daughter over the phone to offer support and discuss patient needs at discharge.  Patient daughter states that patient has been living at [facility] in [place], [place] and has daytime caregivers.  Patient has previously done SNF rehab with return to [facility] and family is agreeable to try again in hopes of patient return.  CSW to initiate referral in [place] area per family request and will follow up with available bed offers.  CSW remains available for support and to facilitate patient discharge needs once medically stable.  Employment status:  Retired Office manager PT Recommendations:  Not assessed at this time Information / Referral to community resources:  [place]  Patient/Family's Response to care:  Patient family verbalized understanding and appreciation of CSW involvement and role in finding adequate placement.  Patient daughter is agreeable with SNF placement and hopeful for recovery for patient to return to [facility].  Patient/Family's Understanding of and Emotional Response to Diagnosis, Current Treatment, and Prognosis:  Patient daughter very realistic about patient care and plan moving forward.  Patient family aware of patient mental status and potential for continued falls.  Emotional Assessment Appearance:  Appears stated age Attitude/Demeanor/Rapport:  Unable to Assess Affect (typically observed):  Unable to Assess Orientation:  Oriented to Self Alcohol / Substance use:  Not Applicable Psych involvement (Current and /or in the community):  No (Comment)  Discharge Needs  Concerns to be addressed:  Discharge Planning Concerns Readmission within the last 30 days:  No Current discharge risk:  Cognitively Impaired, Dependent with Mobility Barriers to Discharge:  Continued Medical Work up   No SBIRT completed due to patient current cognitive status.  Per patient daughter there are no concerns with any drug and/or alcohol use at this time.  Barbette Or, [place]

## 2017-11-01 NOTE — NC FL2 (Signed)
[place] LEVEL OF CARE SCREENING TOOL     IDENTIFICATION  Patient Name: John Summers Birthdate: Mar 05, 1933 Sex: male Admission Date (Current Location): 10/30/2017  [facility] and [place] Number:  Engineering geologist and Address:  The [place]. [facility], [place] [address] [place], [place] [address]      Provider Number: [id number]  Attending Physician Name and Address:  Md, Trauma, MD  Relative Name and Phone Number:       Current Level of Care: Hospital Recommended Level of Care: [place] Prior Approval Number:    Date Approved/Denied:   PASRR Number: [id number]  Discharge Plan: SNF    Current Diagnoses: Patient Active Problem List   Diagnosis Date Noted  . Rib fractures 10/30/2017  . 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 Pancytopenia, acquired ([place]) 08/02/2017  . Visual hallucination 08/02/2017  . Generalized osteoarthritis 03/05/2017  . AF (paroxysmal atrial fibrillation) ([place]) 02/01/2017  . Near syncope 01/06/2017  . Gait disturbance 12/30/2016  . MGUS (monoclonal gammopathy of unknown significance) 05/10/2016  . Pancreatic mass 04/24/2016  . Thrombocytopenia ([place]) 10/04/2015  . Uncontrolled REM sleep behavior disorder 11/16/2014  . Lewy body dementia with behavioral disturbance 11/16/2014  . Tremor of right hand 11/16/2014  . Major depressive disorder, recurrent episode, in partial remission with anxious distress ([place])   . REM sleep behavior disorder 12/07/2013  . Incisional hernia, without obstruction or gangrene 11/14/2012  . Adenocarcinoma of cecum ([place]) 03/19/2012  . GERD (gastroesophageal reflux disease) 03/19/2012  . Sleep apnea   . Essential hypertension     Orientation RESPIRATION BLADDER Height & Weight     Self  Normal Incontinent Weight:   Height:     BEHAVIORAL SYMPTOMS/MOOD NEUROLOGICAL BOWEL NUTRITION STATUS      Incontinent Diet(Regular Diet with Thin Liquids)  AMBULATORY STATUS  COMMUNICATION OF NEEDS Skin   Extensive Assist Verbally Normal                       Personal Care Assistance Level of Assistance  Bathing, Feeding, Dressing Bathing Assistance: Limited assistance Feeding assistance: Limited assistance Dressing Assistance: Limited assistance     Functional Limitations Info  Sight, Hearing, Speech Sight Info: Impaired Hearing Info: Impaired Speech Info: (Missing Teeth)    SPECIAL CARE FACTORS FREQUENCY  PT (By licensed PT), OT (By licensed OT)     PT Frequency: 3x weel OT Frequency: 3x weel            Contractures Contractures Info: Not present    Additional Factors Info  Code Status, Allergies, Psychotropic Code Status Info: Full Code Allergies Info: Aspirin, Chicken Allergy, Other Psychotropic Info: Aricpet, Prozac, Seroquel         Current Medications (11/01/2017):  This is the current hospital active medication list Current Facility-Administered Medications  Medication Dose Route Frequency Provider Last Rate Last Dose  . acetaminophen (TYLENOL) tablet 650 mg  650 mg Oral Q6H Rayburn, Kelly A, PA-C   650 mg at 11/01/17 1017  . donepezil (ARICEPT) tablet 10 mg  10 mg Oral Daily Rolm Bookbinder, MD   10 mg at 11/01/17 1017  . enoxaparin (LOVENOX) injection 40 mg  40 mg Subcutaneous Q24H Rolm Bookbinder, MD   40 mg at 11/01/17 0750  . FLUoxetine (PROZAC) capsule 40 mg  40 mg Oral Daily Rolm Bookbinder, MD   40 mg at 11/01/17 1017  . hydrOXYzine (ATARAX/VISTARIL) tablet 10 mg  10 mg Oral QHS PRN Rolm Bookbinder,  MD   10 mg at 10/31/17 0127  . ondansetron (ZOFRAN-ODT) disintegrating tablet 4 mg  4 mg Oral Q6H PRN Rolm Bookbinder, MD       Or  . ondansetron [facility]) injection 4 mg  4 mg Intravenous Q6H PRN Rolm Bookbinder, MD      . oxyCODONE (Oxy IR/ROXICODONE) immediate release tablet 2.5-5 mg  2.5-5 mg Oral Q4H PRN Rayburn, Kelly A, PA-C   5 mg at 11/01/17 1233  . QUEtiapine (SEROQUEL) tablet 25 mg  25 mg Oral  QHS Rolm Bookbinder, MD   25 mg at 10/31/17 2040  . traMADol (ULTRAM) tablet 50 mg  50 mg Oral Q6H PRN Rayburn, Floyce Stakes, PA-C         Discharge Medications: Please see discharge summary for a list of discharge medications.  Relevant Imaging Results:  Relevant Lab Results:   Additional Information SSN [id number]   Barbette Or, [place]

## 2017-11-02 ENCOUNTER — Encounter (HOSPITAL_COMMUNITY): Payer: Self-pay | Admitting: General Practice

## 2017-11-02 LAB — CBC
HCT: 25.3 % — ABNORMAL LOW (ref 39.0–52.0)
HEMOGLOBIN: 8.4 g/dL — AB (ref 13.0–17.0)
MCH: 36.2 pg — AB (ref 26.0–34.0)
MCHC: 33.2 g/dL (ref 30.0–36.0)
MCV: 109.1 fL — ABNORMAL HIGH (ref 78.0–100.0)
PLATELETS: 111 10*3/uL — AB (ref 150–400)
RBC: 2.32 MIL/uL — ABNORMAL LOW (ref 4.22–5.81)
RDW: 13.9 % (ref 11.5–15.5)
WBC: 1.8 10*3/uL — ABNORMAL LOW (ref 4.0–10.5)

## 2017-11-02 LAB — BASIC METABOLIC PANEL
Anion gap: 10 (ref 5–15)
BUN: 18 mg/dL (ref 6–20)
CALCIUM: 8.7 mg/dL — AB (ref 8.9–10.3)
CHLORIDE: 104 mmol/L (ref 101–111)
CO2: 27 mmol/L (ref 22–32)
CREATININE: 0.74 mg/dL (ref 0.61–1.24)
GFR calc non Af Amer: >60 mL/min (ref >60)
Glucose, Bld: 113 mg/dL — ABNORMAL HIGH (ref 65–99)
Potassium: 4 mmol/L (ref 3.5–5.1)
SODIUM: 141 mmol/L (ref 135–145)

## 2017-11-02 MED ORDER — KCL IN DEXTROSE-NACL 20-5-0.45 MEQ/L-%-% IV SOLN
INTRAVENOUS | Status: DC
  Administered 2017-11-02 – 2017-11-03 (×4): via INTRAVENOUS
  Filled 2017-11-02 (×3): qty 1000

## 2017-11-02 MED ORDER — ADULT MULTIVITAMIN W/MINERALS CH
1.0000 | ORAL_TABLET | Freq: Every day | ORAL | Status: DC
  Administered 2017-11-02 – 2017-11-05 (×4): 1 via ORAL
  Filled 2017-11-02 (×4): qty 1

## 2017-11-02 MED ORDER — BOOST / RESOURCE BREEZE PO LIQD CUSTOM
1.0000 | Freq: Three times a day (TID) | ORAL | Status: DC
  Administered 2017-11-02 – 2017-11-05 (×8): 1 via ORAL

## 2017-11-02 MED ORDER — ENSURE ENLIVE PO LIQD: 237.0000 mL | Freq: Two times a day (BID) | ORAL | Status: DC

## 2017-11-02 NOTE — NC FL2 (Signed)
[place] LEVEL OF CARE SCREENING TOOL     IDENTIFICATION  Patient Name: John Summers Birthdate: 07-Feb-1933 Sex: male Admission Date (Current Location): 10/30/2017  [facility] and [place] Number:  Engineering geologist and Address:  The [place]. [facility], [place] [address], [place], [place] [address]      Provider Number: [id number]  Attending Physician Name and Address:  Md, Trauma, MD  Relative Name and Phone Number:       Current Level of Care: Hospital Recommended Level of Care: [place] Prior Approval Number:    Date Approved/Denied:   PASRR Number:  Discharge Plan: Other (Comment)(Assisted Living - Memory Care)    Current Diagnoses: Patient Active Problem List   Diagnosis Date Noted  . Rib fractures 10/30/2017  . 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 Pancytopenia, acquired ([place]) 08/02/2017  . Visual hallucination 08/02/2017  . Generalized osteoarthritis 03/05/2017  . AF (paroxysmal atrial fibrillation) ([place]) 02/01/2017  . Near syncope 01/06/2017  . Gait disturbance 12/30/2016  . MGUS (monoclonal gammopathy of unknown significance) 05/10/2016  . Pancreatic mass 04/24/2016  . Thrombocytopenia ([place]) 10/04/2015  . Uncontrolled REM sleep behavior disorder 11/16/2014  . Lewy body dementia with behavioral disturbance 11/16/2014  . Tremor of right hand 11/16/2014  . Major depressive disorder, recurrent episode, in partial remission with anxious distress ([place])   . REM sleep behavior disorder 12/07/2013  . Incisional hernia, without obstruction or gangrene 11/14/2012  . Adenocarcinoma of cecum ([place]) 03/19/2012  . GERD (gastroesophageal reflux disease) 03/19/2012  . Sleep apnea   . Essential hypertension     Orientation RESPIRATION BLADDER Height & Weight     Self  Normal Incontinent Weight:   Height:     BEHAVIORAL SYMPTOMS/MOOD NEUROLOGICAL BOWEL NUTRITION STATUS      Incontinent Diet(Regular Diet with Thin  Liquids)  AMBULATORY STATUS COMMUNICATION OF NEEDS Skin   Extensive Assist Verbally Normal                       Personal Care Assistance Level of Assistance  Bathing, Feeding, Dressing Bathing Assistance: Limited assistance Feeding assistance: Limited assistance Dressing Assistance: Limited assistance     Functional Limitations Info  Sight, Hearing, Speech Sight Info: Impaired Hearing Info: Impaired Speech Info: (Missing Teeth)    SPECIAL CARE FACTORS FREQUENCY  PT (By licensed PT), OT (By licensed OT)     PT Frequency: 3x weel OT Frequency: 3x weel            Contractures Contractures Info: Not present    Additional Factors Info  Code Status, Allergies, Psychotropic Code Status Info: Full Code Allergies Info: Aspirin, Chicken Allergy, Other Psychotropic Info: Aricpet, Prozac, Seroquel         Current Medications (11/02/2017):  This is the current hospital active medication list Current Facility-Administered Medications  Medication Dose Route Frequency Provider Last Rate Last Dose  . acetaminophen (TYLENOL) tablet 650 mg  650 mg Oral Q6H Rayburn, Kelly A, PA-C   650 mg at 11/02/17 0800  . dextrose 5 % and 0.45 % NaCl with KCl 20 mEq/L infusion   Intravenous Continuous Rayburn, Kelly A, PA-C 50 mL/hr at 11/02/17 1012    . donepezil (ARICEPT) tablet 10 mg  10 mg Oral Daily Rolm Bookbinder, MD   10 mg at 11/02/17 1007  . enoxaparin (LOVENOX) injection 40 mg  40 mg Subcutaneous Q24H Rolm Bookbinder, MD   40 mg at 11/02/17 0800  . feeding supplement (  BOOST / RESOURCE BREEZE) liquid 1 Container  1 Container Oral TID BM Georganna Skeans, MD      . FLUoxetine (PROZAC) capsule 40 mg  40 mg Oral Daily Rolm Bookbinder, MD   40 mg at 11/02/17 1007  . hydrOXYzine (ATARAX/VISTARIL) tablet 10 mg  10 mg Oral QHS PRN Rolm Bookbinder, MD   10 mg at 10/31/17 0127  . multivitamin with minerals tablet 1 tablet  1 tablet Oral Daily Georganna Skeans, MD      . ondansetron  (ZOFRAN-ODT) disintegrating tablet 4 mg  4 mg Oral Q6H PRN Rolm Bookbinder, MD       Or  . ondansetron [facility]) injection 4 mg  4 mg Intravenous Q6H PRN Rolm Bookbinder, MD      . oxyCODONE (Oxy IR/ROXICODONE) immediate release tablet 2.5-5 mg  2.5-5 mg Oral Q4H PRN Rayburn, Kelly A, PA-C   5 mg at 11/02/17 1450  . QUEtiapine (SEROQUEL) tablet 25 mg  25 mg Oral QHS Rolm Bookbinder, MD   25 mg at 11/01/17 2103  . traMADol (ULTRAM) tablet 50 mg  50 mg Oral Q6H PRN Rayburn, Floyce Stakes, PA-C         Discharge Medications: Please see discharge summary for a list of discharge medications.  Relevant Imaging Results:  Relevant Lab Results:   Additional Information SSN [id number]  Barbette Or, [place]

## 2017-11-02 NOTE — Clinical Social Work Note (Signed)
Clinical Social Worker continuing to follow patient and family for support and discharge planning needs.  CSW spoke with patient daughter, who is agreeable with return to [facility] with home health following.  CSW contacted facility and spoke with [place] who states that patient can return and to fax [phone]) DC summary and FL2 on day of discharge.  CSW updated patient daughter and PA.  CSW remains available for support and to facilitate patient discharge needs once medically stable.  Barbette Or, [place]

## 2017-11-02 NOTE — Care Management Note (Signed)
Case Management Note  Patient Details  Name: ELHADJ GIRTON MRN: [id number] Date of Birth: 08/16/1933  Subjective/Objective:  84 y.o. male admitted on 10/30/17 for fall with resultant L dised multiple rib fractures, lumbar transverse process fx.  Pt with significant PMH of Lewy body dementia; resides at [facility].                   Action/Plan: PT/OT recommending HH follow up at [place].  CSW following to facilitate return to ALF upon medical stability.  Will follow up with facility regarding [place] in ALF.      Expected Discharge Date:                  Expected Discharge Plan:  Assisted Living / Rest Home  In-House Referral:  Clinical Social Work  Discharge planning Services  CM Consult  Post Acute Care Choice:    Choice offered to:     DME Arranged:    DME Agency:     HH Arranged:    HH Agency:     Status of Service:  In process, will continue to follow  If discussed at Long Length of Stay Meetings, dates discussed:    Additional Comments:  Reinaldo Raddle, RN, BSN  Trauma/Neuro ICU Case Manager [phone]

## 2017-11-02 NOTE — Progress Notes (Signed)
[facility] Progress Note     Subjective: CC: pain in ribs Patient with some pain on deep inspiration, pain medication helps. Patient has Charity fundraiser in the room. She states he has not been drinking much and his daughter is concerned with urine appearing dark.   Objective: Vital signs in last 24 hours: Temp:  [97.5 F (36.4 C)-98.6 F (37 C)] 97.6 F (36.4 C) (02/19 0355) Pulse Rate:  [71-74] 71 (02/19 0355) Resp:  [18] 18 (02/19 0355) BP: (99-120)/(55-78) 119/78 (02/19 0355) SpO2:  [96 %-99 %] 99 % (02/19 0355)    Intake/Output from previous day: 02/18 0701 - 02/19 0700 In: 320 [P.O.:320] Out: 75 [Urine:75] Intake/Output this shift: No intake/output data recorded.  PE: Gen:  resting, NAD Card:  Regular rate and rhythm, pedal pulses 2+ BL Pulm:  Normal effort, clear to auscultation bilaterally, pulled 1000 on IS Abd: Soft, non-tender, non-distended, bowel sounds present in all 4 quadrants, no HSM Skin: warm and dry, no rashes   Lab Results:  Recent Labs    10/30/17 1004 10/31/17 0343  WBC 2.2* 2.6*  HGB 8.5* 8.3*  HCT 25.3* 25.6*  PLT 99* 105*   BMET Recent Labs    10/30/17 1004 10/31/17 0343  NA 137 138  K 4.2 4.1  CL 105 106  CO2 25 21*  GLUCOSE 109* 108*  BUN 19 16  CREATININE 0.97 0.84  CALCIUM 8.3* 8.5*   PT/INR No results for input(s): LABPROT, INR in the last 72 hours. CMP     Component Value Date/Time   NA 138 10/31/2017 0343   NA 146 02/09/2017   NA 144 10/10/2014 1051   K 4.1 10/31/2017 0343   K 3.9 10/10/2014 1051   CL 106 10/31/2017 0343   CL 106 10/10/2014 1051   CO2 21 (L) 10/31/2017 0343   CO2 30 10/10/2014 1051   GLUCOSE 108 (H) 10/31/2017 0343   GLUCOSE 133 (H) 10/10/2014 1051   BUN 16 10/31/2017 0343   BUN 20 02/09/2017   BUN 23 (H) 10/10/2014 1051   CREATININE 0.84 10/31/2017 0343   CREATININE 1.17 10/10/2014 1051   CALCIUM 8.5 (L) 10/31/2017 0343   CALCIUM 8.4 (L) 10/10/2014 1051   PROT 7.3 07/21/2017  2047   ALBUMIN 3.5 07/21/2017 2047   AST 22 07/21/2017 2047   ALT 12 (L) 07/21/2017 2047   ALKPHOS 77 07/21/2017 2047   BILITOT 0.5 07/21/2017 2047   GFRNONAA >60 10/31/2017 0343   GFRNONAA >60 10/10/2014 1051   GFRNONAA 59 (L) 04/02/2014 1045   GFRAA >60 10/31/2017 0343   GFRAA >60 10/10/2014 1051   GFRAA >60 04/02/2014 1045   Lipase     Component Value Date/Time   LIPASE 92 (H) 07/21/2017 2047       Studies/Results: No results found.  Anti-infectives: Anti-infectives (From admission, onward)   None       Assessment/Plan Fall Multiple rib fractures of the left side with small HPTX - repeat CXR 2/17 without ptx, left base atelectasis - IS, pulm toilet - pain control - will schedule tylenol to try to minimize narcotics ?Lumbar TP fracture - PT/OT, pain control Dementia - home meds, except Namenda per family request  Anemia - likely chronic, VSS  FEN: regular diet VTE: SCDs, lovenox ID: no abx indicated   Dispo: Recheck CBC. PT/OT. SNF    LOS: 3 days    Brigid Re , [facility] 11/02/2017, 8:22 AM Pager: [phone] Trauma Pager: [phone] Mon-Fri 7:00 am-4:30  pm Sat-Sun 7:00 am-11:30 am

## 2017-11-02 NOTE — Progress Notes (Signed)
Initial Nutrition Assessment  DOCUMENTATION CODES:   Non-severe (moderate) malnutrition in context of chronic illness  INTERVENTION:   -Boost Breeze po TID, each supplement provides 250 kcal and 9 grams of protein -MVI daily  NUTRITION DIAGNOSIS:   Moderate Malnutrition related to chronic illness(dementia) as evidenced by percent weight loss, mild fat depletion, moderate fat depletion, mild muscle depletion, moderate muscle depletion.  GOAL:   Patient will meet greater than or equal to 90% of their needs  MONITOR:   PO intake, Supplement acceptance, Labs, Weight trends, Skin, I & O's  REASON FOR ASSESSMENT:   Malnutrition Screening Tool    ASSESSMENT:   12 yom tx from [facility] after fall today. He has a couple of falls recently and some changes since Namenda was started not long ago.  Pt admitted with multiple lt side rib fractures with small HPTX.   Pt unable to provide hx due to cognitive deficit. Spoke with pt's caregiver at bedside, who reports pt has been a resident of [facility] in [place], [place] for the past 6 months. Caregivers shares pt has experienced a general decline in health since entering facility.   Pt with variable appetite. Per caregiver, pt is a very selective eater, however, will eat well if he is provided with items he likes (pt will not eat if he does not like items being served). Pt did not eat breakfast this morning, but caregiver ordered cheeseburger and french fries for lunch which she thinks he will accept well. Pt does feed himself. Pt does not like Ensure supplements, however, caregivers suspects that he will like [employer].   Caregiver confirmed severe wt loss over the past 6 months. Per wt hx, pt has experienced a 17% wt loss over the past 6 months, which is significant for time frame.   Discussed importance of good nutritional intake of meals and supplements to promote healing.   NUTRITION - FOCUSED PHYSICAL EXAM:    Most Recent Value   Orbital Region  Moderate depletion  Upper Arm Region  Moderate depletion  Thoracic and Lumbar Region  Mild depletion  Buccal Region  Mild depletion  Temple Region  Mild depletion  Clavicle Bone Region  Moderate depletion  Clavicle and Acromion Bone Region  Moderate depletion  Scapular Bone Region  Moderate depletion  Dorsal Hand  Moderate depletion  Patellar Region  Moderate depletion  Anterior Thigh Region  Mild depletion  Posterior Calf Region  Moderate depletion  Edema (RD Assessment)  Mild  Hair  Reviewed  Eyes  Reviewed  Mouth  Reviewed  Skin  Reviewed  Nails  Reviewed       Diet Order:  Diet regular Room service appropriate? Yes; Fluid consistency: Thin Fall precautions  EDUCATION NEEDS:   Education needs have been addressed  Skin:  Skin Assessment: Reviewed RN Assessment  Last BM:  PTA  Height:   Ht Readings from Last 1 Encounters:  11/02/17 5\' 10"  (1.778 m)    Weight:   Wt Readings from Last 1 Encounters:  11/02/17 166 lb 0.1 oz (75.3 kg)    Ideal Body Weight:  75.5 kg  BMI:  Body mass index is 23.82 kg/m.  Estimated Nutritional Needs:   Kcal:  1800-2000  Protein:  90-105 grams  Fluid:  1.8-2.0 L    Mahati Vajda A. Jimmye Norman, RD, LDN, CDE Pager: [phone] After hours Pager: [phone]

## 2017-11-03 DIAGNOSIS — E440 Moderate protein-calorie malnutrition: Secondary | ICD-10-CM

## 2017-11-03 NOTE — NC FL2 (Signed)
[place] LEVEL OF CARE SCREENING TOOL     IDENTIFICATION  Patient Name: John Summers Birthdate: 10/10/1932 Sex: male Admission Date (Current Location): 10/30/2017  [facility] and [place] Number:  Engineering geologist and Address:  The [place]. [facility], [place] [address] [place], [place] [address]      Provider Number: [id number]  Attending Physician Name and Address:  Md, Trauma, MD  Relative Name and Phone Number:       Current Level of Care: Hospital Recommended Level of Care: [place] Prior Approval Number:    Date Approved/Denied:   PASRR Number: [id number]  Discharge Plan: SNF    Current Diagnoses: Patient Active Problem List   Diagnosis Date Noted  . Malnutrition of moderate degree 11/03/2017  . Rib fractures 10/30/2017  . 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 Pancytopenia, acquired ([place]) 08/02/2017  . Visual hallucination 08/02/2017  . Generalized osteoarthritis 03/05/2017  . AF (paroxysmal atrial fibrillation) ([place]) 02/01/2017  . Near syncope 01/06/2017  . Gait disturbance 12/30/2016  . MGUS (monoclonal gammopathy of unknown significance) 05/10/2016  . Pancreatic mass 04/24/2016  . Thrombocytopenia ([place]) 10/04/2015  . Uncontrolled REM sleep behavior disorder 11/16/2014  . Lewy body dementia with behavioral disturbance 11/16/2014  . Tremor of right hand 11/16/2014  . Major depressive disorder, recurrent episode, in partial remission with anxious distress ([place])   . REM sleep behavior disorder 12/07/2013  . Incisional hernia, without obstruction or gangrene 11/14/2012  . Adenocarcinoma of cecum ([place]) 03/19/2012  . GERD (gastroesophageal reflux disease) 03/19/2012  . Sleep apnea   . Essential hypertension     Orientation RESPIRATION BLADDER Height & Weight     Self  Normal Incontinent Weight: 166 lb 0.1 oz (75.3 kg) Height:  5\' 10"  (177.8 cm)  BEHAVIORAL SYMPTOMS/MOOD NEUROLOGICAL BOWEL  NUTRITION STATUS      Incontinent Diet(Regular Diet with Thin Liquids)  AMBULATORY STATUS COMMUNICATION OF NEEDS Skin   Extensive Assist Verbally Normal                       Personal Care Assistance Level of Assistance  Bathing, Feeding, Dressing Bathing Assistance: Limited assistance Feeding assistance: Limited assistance Dressing Assistance: Limited assistance     Functional Limitations Info  Sight, Hearing, Speech Sight Info: Impaired Hearing Info: Impaired Speech Info: (Missing Teeth)    SPECIAL CARE FACTORS FREQUENCY  PT (By licensed PT), OT (By licensed OT)     PT Frequency: 3x weel OT Frequency: 3x weel            Contractures Contractures Info: Not present    Additional Factors Info  Code Status, Allergies, Psychotropic Code Status Info: Full Code Allergies Info: Aspirin, Chicken Allergy, Other Psychotropic Info: Aricpet, Prozac, Seroquel         Current Medications (11/03/2017):  This is the current hospital active medication list Current Facility-Administered Medications  Medication Dose Route Frequency Provider Last Rate Last Dose  . acetaminophen (TYLENOL) tablet 650 mg  650 mg Oral Q6H Rayburn, Kelly A, PA-C   650 mg at 11/03/17 1052  . dextrose 5 % and 0.45 % NaCl with KCl 20 mEq/L infusion   Intravenous Continuous Rayburn, Kelly A, PA-C 50 mL/hr at 11/03/17 1233    . donepezil (ARICEPT) tablet 10 mg  10 mg Oral Daily Rolm Bookbinder, MD   10 mg at 11/03/17 1052  . enoxaparin (LOVENOX) injection 40 mg  40 mg Subcutaneous Q24H Rolm Bookbinder, MD  40 mg at 11/03/17 1052  . feeding supplement (BOOST / RESOURCE BREEZE) liquid 1 Container  1 Container Oral TID BM Georganna Skeans, MD   1 Container at 11/03/17 1053  . FLUoxetine (PROZAC) capsule 40 mg  40 mg Oral Daily Rolm Bookbinder, MD   40 mg at 11/03/17 1052  . hydrOXYzine (ATARAX/VISTARIL) tablet 10 mg  10 mg Oral QHS PRN Rolm Bookbinder, MD   10 mg at 11/02/17 2207  . multivitamin  with minerals tablet 1 tablet  1 tablet Oral Daily Georganna Skeans, MD   1 tablet at 11/03/17 1052  . ondansetron (ZOFRAN-ODT) disintegrating tablet 4 mg  4 mg Oral Q6H PRN Rolm Bookbinder, MD       Or  . ondansetron [facility]) injection 4 mg  4 mg Intravenous Q6H PRN Rolm Bookbinder, MD      . oxyCODONE (Oxy IR/ROXICODONE) immediate release tablet 2.5-5 mg  2.5-5 mg Oral Q4H PRN Rayburn, Kelly A, PA-C   5 mg at 11/02/17 1450  . QUEtiapine (SEROQUEL) tablet 25 mg  25 mg Oral QHS Rolm Bookbinder, MD   25 mg at 11/02/17 2207  . traMADol (ULTRAM) tablet 50 mg  50 mg Oral Q6H PRN Rayburn, Kelly A, PA-C   50 mg at 11/03/17 1207     Discharge Medications: Please see discharge summary for a list of discharge medications.  Relevant Imaging Results:  Relevant Lab Results:   Additional Information SSN [id number]   Barbette Or, [place]

## 2017-11-03 NOTE — Progress Notes (Signed)
Physical Therapy Treatment Patient Details Name: John Summers MRN: [id number] DOB: July 29, 1933 Today's Date: 11/03/2017    History of Present Illness 84 y.o. male admitted on 10/30/17 for fall with resultant L dised multiple rib fractures, lumbar transverse process fx.  Pt with significant PMH of Lewy body dementia at memory care ALF, essential HTN, and L THA.    PT Comments    Multiple attempts were made today at gait with RW, without RW, with aide (familiar face) attempting to assist and pt outright refused to ambulate.  We attempted stern approach and gentle approach and the only thing he was agreeable to was to get back in the bed.  Without being able to walk (and knowing he can with only one person assist) I am fearful he is not safe to return to his memory care unit.  PT changed recs to SNF at this time.     Follow Up Recommendations  SNF     Equipment Recommendations  None recommended by PT    Recommendations for Other Services   NA     Precautions / Restrictions Precautions Precautions: Fall    Mobility  Bed Mobility Overal bed mobility: Needs Assistance Bed Mobility: Sit to Supine       Sit to supine: Mod assist   General bed mobility comments: Mod assist to support trunk and legs to return to supine.   Transfers Overall transfer level: Needs assistance Equipment used: Rolling walker (2 wheeled);2 person hand held assist;None Transfers: Sit to/from [facility] Sit to Stand: Mod assist;+2 physical assistance Stand pivot transfers: Mod assist       General transfer comment: Two person mod assist to stand multiple times from lower recliner chair to RW.  Assist needed at trunk to power up.  Tried two person hand held assist, tried RW and tried one person hand held assist and all required mod assist today.  Stand pivot with one person mod assist was a bit tenuous as pt wanted to sit down before he got his hips turned all the way around.    Ambulation/Gait             General Gait Details: Unable.  Tried multiple times and pt refusing/trying to sit down in protest.       Balance Overall balance assessment: Needs assistance Sitting-balance support: Feet supported;Bilateral upper extremity supported Sitting balance-Leahy Scale: Poor     Standing balance support: Bilateral upper extremity supported Standing balance-Leahy Scale: Poor                              Cognition Arousal/Alertness: Awake/alert Behavior During Therapy: Agitated Overall Cognitive Status: History of cognitive impairments - at baseline                                 General Comments: Pt emotionally labile today going from angry to crying to shaking (which his aide says if you walk away will stop and it did).               Pertinent Vitals/Pain Pain Assessment: Faces Faces Pain Scale: Hurts even more Pain Location: back and left ribs Pain Descriptors / Indicators: Grimacing;Guarding Pain Intervention(s): Limited activity within patient's tolerance;Monitored during session;Repositioned    Home Living Family/patient expects to be discharged to:: Assisted living  PT Goals (current goals can now be found in the care plan section) Progress towards PT goals: Not progressing toward goals - comment    Frequency    Min 2X/week      PT Plan Discharge plan needs to be updated;Frequency needs to be updated       AM-PAC PT "6 Clicks" Daily Activity  Outcome Measure  Difficulty turning over in bed (including adjusting bedclothes, sheets and blankets)?: Unable Difficulty moving from lying on back to sitting on the side of the bed? : Unable Difficulty sitting down on and standing up from a chair with arms (e.g., wheelchair, bedside commode, etc,.)?: Unable Help needed moving to and from a bed to chair (including a wheelchair)?: A Lot Help needed walking in hospital room?: A  Lot Help needed climbing 3-5 steps with a railing? : Total 6 Click Score: 8    End of Session   Activity Tolerance: Patient limited by pain;Other (comment)(limited by cognition) Patient left: in bed;with call bell/phone within reach;with bed alarm set Nurse Communication: Mobility status PT Visit Diagnosis: Muscle weakness (generalized) (M62.81);Difficulty in walking, not elsewhere classified (R26.2);Pain Pain - Right/Left: Left Pain - part of body: (side and back)     Time: 1610-9604 PT Time Calculation (min) (ACUTE ONLY): 39 min  Charges:  $Therapeutic Activity: 38-52 mins          Jenny Lai B. Swade Shonka, [place], DPT [phone]            11/03/2017, 10:47 AM

## 2017-11-03 NOTE — Clinical Social Work Note (Signed)
Clinical Social Worker continuing to follow patient and family for support and discharge planning needs.  CSW spoke with patient daughter about patient disposition - patient daughter would like to further pursue SNF placement prior to return to [facility].  CSW provided bed offers and family has chosen [place] updated facility who will initiate insurance authorization with plans for patient to discharge tomorrow.  Patient to likely transport by [facility] but await patient disposition tomorrow to be sure.  CSW remains available for support and to facilitate patient discharge needs once medically stable.  Barbette Or, [place]

## 2017-11-03 NOTE — Care Management Important Message (Signed)
Important Message  Patient Details  Name: John Summers MRN: [id number] Date of Birth: [birth year]   Medicare Important Message Given:  Yes    Orbie Pyo 11/03/2017, 1:53 PM

## 2017-11-03 NOTE — Clinical Social Work Placement (Signed)
   CLINICAL SOCIAL WORK PLACEMENT  NOTE  Date:  11/03/2017  Patient Details  Name: John Summers MRN: [id number] Date of Birth: Mar 21, 1933  Clinical Social Work is seeking post-discharge placement for this patient at the [place] level of care (*CSW will initial, date and re-position this form in  chart as items are completed):  Yes   Patient/family provided with [place] Work Department's list of facilities offering this level of care within the geographic area requested by the patient (or if unable, by the patient's family).  Yes   Patient/family informed of their freedom to choose among providers that offer the needed level of care, that participate in Medicare, Medicaid or managed care program needed by the patient, have an available bed and are willing to accept the patient.  Yes   Patient/family informed of [place]'s ownership interest in [facility] and [facility], as well as of the fact that they are under no obligation to receive care at these facilities.  PASRR submitted to EDS on       PASRR number received on       Existing PASRR number confirmed on 11/01/17     FL2 transmitted to all facilities in geographic area requested by pt/family on 11/01/17     FL2 transmitted to all facilities within larger geographic area on       Patient informed that his/her managed care company has contracts with or will negotiate with certain facilities, including the following:        Yes   Patient/family informed of bed offers received.  Patient chooses bed at [facility]     Physician recommends and patient chooses bed at      Patient to be transferred to [facility] on 11/04/17.  Patient to be transferred to facility by Ambulance     Patient family notified on 11/04/17 of transfer.  Name of family member notified:  Patient daughter [name]     PHYSICIAN Please prepare priority discharge  summary, including medications, Please sign FL2     Additional Comment:   Barbette Or, [place]

## 2017-11-03 NOTE — Progress Notes (Signed)
[facility]/Trauma Progress Note      Assessment/Plan Fall Multiple rib fractures of the left side with small HPTX - repeat CXR 2/17 without ptx, left base atelectasis - IS, pulm toilet - pain control - will schedule tylenol to try to minimize narcotics ?Lumbar TP fracture - PT/OT, pain control Dementia- home meds, except Namenda per family request  Anemia- likely chronic, VSS, Hg stable  FEN: regular diet VTE: SCDs, lovenox ID: no abx indicated   Dispo: PT/OT today and likely will return to memory care facilty later today or tomorrow    LOS: 4 days    Subjective: CC: rib pain  Pt states continued rib pain. Pt was just in the bathroom and had a BM. Private sitter states pt is not walking well and he was difficult to transfer. She states he needs a lot of help with his ADL's at the ALF. She is concerned that he will be unstable and not able to ambulate when he returns to the ALF. Spoke with daughter on the phone and informed her that PT will work with him today and we will make our decisions about discharge based on their recommendations.   Objective: Vital signs in last 24 hours: Temp:  [97.6 F (36.4 C)-98.4 F (36.9 C)] 98.4 F (36.9 C) (02/20 0517) Pulse Rate:  [62-83] 62 (02/20 0517) Resp:  [17-18] 17 (02/20 0517) BP: (114-118)/(58-82) 118/58 (02/20 0517) SpO2:  [94 %-99 %] 94 % (02/20 0517) Weight:  [166 lb 0.1 oz (75.3 kg)] 166 lb 0.1 oz (75.3 kg) (02/19 1543) Last BM Date: 11/02/17  Intake/Output from previous day: 02/19 0701 - 02/20 0700 In: 1565.3 [P.O.:537; I.V.:1028.3] Out: 400 [Urine:400] Intake/Output this shift: Total I/O In: 360 [P.O.:360] Out: -   PE: Gen:  NAD, sitting up in chair Card:  Regular rate and rhythm Pulm:  Normal effort, clear to auscultation bilaterally Skin: warm and dry, no rashes Extremities: no edema   Anti-infectives: Anti-infectives (From admission, onward)   None      Lab Results:  Recent Labs    11/02/17 1014  WBC 1.8*  HGB 8.4*  HCT 25.3*  PLT 111*   BMET Recent Labs    11/02/17 1014  NA 141  K 4.0  CL 104  CO2 27  GLUCOSE 113*  BUN 18  CREATININE 0.74  CALCIUM 8.7*   PT/INR No results for input(s): LABPROT, INR in the last 72 hours. CMP     Component Value Date/Time   NA 141 11/02/2017 1014   NA 146 02/09/2017   NA 144 10/10/2014 1051   K 4.0 11/02/2017 1014   K 3.9 10/10/2014 1051   CL 104 11/02/2017 1014   CL 106 10/10/2014 1051   CO2 27 11/02/2017 1014   CO2 30 10/10/2014 1051   GLUCOSE 113 (H) 11/02/2017 1014   GLUCOSE 133 (H) 10/10/2014 1051   BUN 18 11/02/2017 1014   BUN 20 02/09/2017   BUN 23 (H) 10/10/2014 1051   CREATININE 0.74 11/02/2017 1014   CREATININE 1.17 10/10/2014 1051   CALCIUM 8.7 (L) 11/02/2017 1014   CALCIUM 8.4 (L) 10/10/2014 1051   PROT 7.3 07/21/2017 2047   ALBUMIN 3.5 07/21/2017 2047   AST 22 07/21/2017 2047   ALT 12 (L) 07/21/2017 2047   ALKPHOS 77 07/21/2017 2047   BILITOT 0.5 07/21/2017 2047   GFRNONAA >60 11/02/2017 1014   GFRNONAA >60 10/10/2014 1051   GFRNONAA 59 (L) 04/02/2014 1045   GFRAA >60 11/02/2017 1014  GFRAA >60 10/10/2014 1051   GFRAA >60 04/02/2014 1045   Lipase     Component Value Date/Time   LIPASE 92 (H) 07/21/2017 2047    Studies/Results: No results found.    Kalman Drape , [facility] 11/03/2017, 9:52 AM Pager: [phone] Consults: [phone] Mon-Fri 7:00 am-4:30 pm Sat-Sun 7:00 am-11:30 am

## 2017-11-04 ENCOUNTER — Telehealth: Payer: Self-pay | Admitting: Neurology

## 2017-11-04 MED ORDER — ADULT MULTIVITAMIN W/MINERALS CH: 1.0000 | ORAL_TABLET | Freq: Every day | ORAL | Status: DC

## 2017-11-04 MED ORDER — TRAMADOL HCL 50 MG PO TABS: 50.0000 mg | ORAL_TABLET | Freq: Four times a day (QID) | ORAL | 1 refills | Status: AC | PRN

## 2017-11-04 MED ORDER — ACETAMINOPHEN 325 MG PO TABS: 650.0000 mg | ORAL_TABLET | Freq: Four times a day (QID) | ORAL | Status: DC | PRN

## 2017-11-04 NOTE — Progress Notes (Signed)
Spoke to [place] at [facility] and gave report for Pt's transfer to facility.

## 2017-11-04 NOTE — Progress Notes (Addendum)
Pt OOB to [facility] this afternoon at approximately 1700 for preparation for transport to SNF, took the Pt's VS and he was 76/55 automatic, check manually and he was 72/52.  Pt was returned to bed and laid his head on his pillow and did not respond to his step daughter, Pt then responded.  [name] said that the Pt does this periodically.  At that time EMS left due to Pt being hypotensive.  EMS asked to be called if Pt to be discharge tonight, after hypotensive episode addressed.   Pt's blood pressure was taken again and he was 125/73, when he was lying in bed a couple of minutes later. MD paged x2 awaiting his response, if Dr. Ninfa Linden would like [place] to continue with discharge to SNF or hold Patient overnight due to episode

## 2017-11-04 NOTE — Progress Notes (Signed)
Spoke to Dr. Ninfa Linden. Advised him of the episode, and the EMS had left.  Dr. Ninfa Linden indicated that the Pt will need to stay for the night, and see how he does and possibly discharge in the morning, since EMS has left tonight.

## 2017-11-04 NOTE — Care Management Note (Signed)
Case Management Note  Patient Details  Name: REYHAN MORONTA MRN: [id number] Date of Birth: 08-25-1933  Subjective/Objective:  84 y.o. male admitted on 10/30/17 for fall with resultant L dised multiple rib fractures, lumbar transverse process fx.  Pt with significant PMH of Lewy body dementia; resides at [facility].                   Action/Plan: PT/OT recommending HH follow up at [place].  CSW following to facilitate return to ALF upon medical stability.  Will follow up with facility regarding [place] in ALF.      Expected Discharge Date:  11/04/17               Expected Discharge Plan:  Skilled Nursing Facility  In-House Referral:  Clinical Social Work  Discharge planning Services  CM Consult  Post Acute Care Choice:    Choice offered to:     DME Arranged:    DME Agency:     HH Arranged:    [place] Agency:     Status of Service:  Completed, signed off  If discussed at [employer], dates discussed:    Additional Comments:  11/04/17 J. Nnamdi Dacus, RN, BSN PT changed recommendation to SNF, as pt refusing/unable to ambulate and lives at [place].  Pt discharging to SNF today, per CSW arrangements.    Reinaldo Raddle, RN, BSN  Trauma/Neuro ICU Case Manager [phone]

## 2017-11-04 NOTE — Discharge Instructions (Signed)
Rib Fracture A rib fracture is a break or crack in one of the bones of the ribs. The ribs are like a cage that goes around your upper chest. A broken or cracked rib is often painful, but most do not cause other problems. Most rib fractures heal on their own in 1-3 months. Follow these instructions at home:  Avoid activities that cause pain to the injured area. Protect your injured area.  Slowly increase activity as told by your doctor.  Take medicine as told by your doctor.  Put ice on the injured area for the first 1-2 days after you have been treated or as told by your doctor. ? Put ice in a plastic bag. ? Place a towel between your skin and the bag. ? Leave the ice on for 15-20 minutes at a time, every 2 hours while you are awake.  Do deep breathing as told by your doctor. You may be told to: ? Take deep breaths many times a day. ? Cough many times a day while hugging a pillow. ? Use a device (incentive spirometer) to perform deep breathing many times a day.  Drink enough fluids to keep your pee (urine) clear or pale yellow.  Do not wear a rib belt or binder. These do not allow you to breathe deeply. Get help right away if:  You have a fever.  You have trouble breathing.  You cannot stop coughing.  You cough up thick or bloody spit (mucus).  You feel sick to your stomach (nauseous), throw up (vomit), or have belly (abdominal) pain.  Your pain gets worse and medicine does not help. This information is not intended to replace advice given to you by your health care provider. Make sure you discuss any questions you have with your health care provider. Document Released: 06/09/2008 Document Revised: 02/06/2016 Document Reviewed: 11/02/2012 Elsevier Interactive Patient Education  [employer].  1. PAIN CONTROL:  1. Pain is best controlled by a usual combination of three different methods TOGETHER:  1. Ice/Heat 2. Over the counter pain medication 3. Prescription pain  medication 2. Most patients will experience some swelling and bruising around wounds. Ice packs or heating pads (30-60 minutes up to 6 times a day) will help. Use ice for the first few days to help decrease swelling and bruising, then switch to heat to help relax tight/sore spots and speed recovery. Some people prefer to use ice alone, heat alone, alternating between ice & heat. Experiment to what works for you. Swelling and bruising can take several weeks to resolve.  3. It is helpful to take an over-the-counter pain medication regularly for the first few weeks. Choose one of the following that works best for you:  1. Naproxen (Aleve, etc) Two 220mg  tabs twice a day 2. Ibuprofen (Advil, etc) Three 200mg  tabs four times a day (every meal & bedtime) 3. Acetaminophen (Tylenol, etc) 500-650mg  four times a day (every meal & bedtime) 4. A prescription for pain medication (such as oxycodone, hydrocodone, etc) should be given to you upon discharge. Take your pain medication as prescribed.  1. If you are having problems/concerns with the prescription medicine (does not control pain, nausea, vomiting, rash, itching, etc), please call us [phone] to see if we need to switch you to a different pain medicine that will work better for you and/or control your side effect better. 2. If you need a refill on your pain medication, please contact your pharmacy. They will contact our office to request  authorization. Prescriptions will not be filled after 5 pm or on week-ends. 4. Avoid getting constipated. When taking pain medications, it is common to experience some constipation. Increasing fluid intake and taking a fiber supplement (such as Metamucil, Citrucel, FiberCon, MiraLax, etc) 1-2 times a day regularly will usually help prevent this problem from occurring. A mild laxative (prune juice, Milk of Magnesia, MiraLax, etc) should be taken according to package directions if there are no bowel movements after 48 hours.    5. Watch out for diarrhea. If you have many loose bowel movements, simplify your diet to bland foods & liquids for a few days. Stop any stool softeners and decrease your fiber supplement. Switching to mild anti-diarrheal medications (Kayopectate, Pepto Bismol) can help. If this worsens or does not improve, please call us. 6. Wash / shower every day. You may shower daily and replace your bandges after showering. No bathing or submerging your wounds in water until they heal. 7. FOLLOW UP in our office  1. Please call [facility] at [phone] to set up an appointment for a follow-up appointment approximately 2-3 weeks after discharge for wound check  WHEN TO CALL us [phone]:  1. Poor pain control 2. Reactions / problems with new medications (rash/itching, nausea, etc)  3. Fever over 101.5 F (38.5 C) 4. Worsening swelling or bruising 5. Continued bleeding from wounds. 6. Increased pain, redness, or drainage from the wounds which could be signs of infection  The clinic staff is available to answer your questions during regular business hours (8:30am-5pm). Please dont hesitate to call and ask to speak to one of our nurses for clinical concerns.  If you have a medical emergency, go to the nearest emergency room or call 911.  A surgeon from [facility] is always on call at the [facility], [place], [place], [place], [place] [address] ?  MAIN: [phone] ? TOLL FREE: [phone] ?  FAX [phone]  [email or web address]

## 2017-11-04 NOTE — Telephone Encounter (Signed)
Called John Summers, patient is scheduled for a apt on Monday. He was recently seen on 10/20/2017. This apt for Monday was made in November. I just think this is a mistake. LVM for the daughter to call back and address if this apt is necessary. If she feels it is not please cancel the apt for the patient.

## 2017-11-04 NOTE — Clinical Social Work Note (Signed)
Clinical Social Worker facilitated patient discharge including contacting patient family and facility to confirm patient discharge plans.  Clinical information faxed to facility and family agreeable with plan.  CSW arranged ambulance transport via [facility] to [facility].  RN to call report prior to discharge.  Clinical Social Worker will sign off for now as social work intervention is no longer needed. Please consult [place] again if new need arises.  Barbette Or, [place]

## 2017-11-04 NOTE — Discharge Summary (Signed)
Physician Discharge Summary  Patient ID: John Summers MRN: [id number] DOB/AGE: Jun 09, 1933 84 y.o.  Admit date: 10/30/2017 Discharge date: 11/04/2017  Discharge Diagnoses Fall  Multiple rib fractures on the left with small hemopneumothorax - hemopneumothorax  Resolved Dementia Anemia, chronic Neutropenia, chronic  Consultants None  Procedures None  Hospital Course: 84 y.o. male transfered from [facility] after fall today. He has had a couple of falls recently and some changes since Namenda was started not long ago. Family thinks this has made things worse for him. He complains of left sided chest pain. No SOB. Has recent treatment for UTI. Patient was admitted to the trauma service for pain control and PT/OT. Follow up CXR showed no pneumothorax. PT/OT evaluated patient and recommended 24 hour supervision with continued therapies. Initially it was thought that patient could return to his memory care facility with private sitters and home health, but after reevaluation felt that he would need SNF placement. [place] was held during hospital admission per family request, will leave decision of whether or not to restart this medication to family and prescribing provider.   On 11/04/17, he will be discharged to SNF in stable condition. He may follow up with his primary care provider in a few weeks and call trauma clinic as needed.   Physical Exam: Gen:NAD, sitting in bed Card: Regular rate and rhythm Pulm: Normal effort, clear to auscultation bilaterally Skin: warm and dry, no rashes Extremities: no edema   Allergies as of 11/04/2017      Reactions   Aspirin Other (See Comments)   Chicken Allergy Nausea And Vomiting   Unknown   Other Nausea And Vomiting   [place] per daughter       Medication List    STOP taking these medications   memantine 5 MG tablet Commonly known as:  NAMENDA     TAKE these medications   acetaminophen 325 MG tablet Commonly known as:  TYLENOL Take 2  tablets (650 mg total) by mouth every 6 (six) hours as needed for mild pain. What changed:    when to take this  reasons to take this   ALPRAZolam 0.5 MG tablet Commonly known as:  XANAX Take 1 tablet (0.5 mg total) by mouth at bedtime as needed for anxiety.   donepezil 10 MG tablet Commonly known as:  ARICEPT take 1 tablet by mouth once daily   FLUoxetine 20 MG tablet Commonly known as:  PROZAC Take 2 tablets (40 mg total) by mouth daily. 2 tab of 20 mg once a day. What changed:  additional instructions   hydrOXYzine 10 MG tablet Commonly known as:  ATARAX/VISTARIL take 1 tablet by mouth at bedtime   Melatonin 5 MG Tabs Take 1 tablet (5 mg total) by mouth at bedtime.   multivitamin with minerals Tabs tablet Take 1 tablet by mouth daily.   QUEtiapine 25 MG tablet Commonly known as:  SEROQUEL Take 1 tablet (25 mg total) by mouth at bedtime.   traMADol 50 MG tablet Commonly known as:  ULTRAM Take 1 tablet (50 mg total) by mouth every 6 (six) hours as needed for moderate pain. What changed:  reasons to take this         Contact information for follow-up providers    [place], Dahlia Client, [place]. Call.   Specialty:  Internal Medicine Why:  Call to follow up in 3-4 weeks for rib fractures if needed. Contact information: [place] [address] [phone]  [facility]. Call.   Why:  Call as needed.  Contact information: [place] [address] [phone]           Contact information for after-discharge care    Destination    [facility] Follow up.   Service:  Skilled Nursing Contact information: [place] [phone]                  Signed: Brigid Re , [facility] 11/04/2017, 8:56 AM Pager: [phone] Trauma: [phone] Mon-Fri 7:00 am-4:30 pm Sat-Sun 7:00 am-11:30  am

## 2017-11-05 NOTE — Progress Notes (Signed)
PT Cancellation Note  Patient Details Name: CEPHAS REVARD MRN: [id number] DOB: 12-05-1932   Cancelled Treatment:    Reason Eval/Treat Not Completed: Other (comment) patient discharging to SNF via [facility] today, no skilled PT provided today due to transfer to facility.    Deniece Ree PT, DPT, CBIS  Supplemental Physical Therapist [facility]   Pager [phone]

## 2017-11-05 NOTE — Clinical Social Work Placement (Signed)
   [place]  NOTE [place] Room 508 RN to call report to [phone]  Date:  11/05/2017  Patient Details  Name: John Summers MRN: [id number] Date of Birth: 1933/06/01  Clinical Social Work is seeking post-discharge placement for this patient at the [place] level of care (*CSW will initial, date and re-position this form in  chart as items are completed):  Yes   Patient/family provided with [place] Work Department's list of facilities offering this level of care within the geographic area requested by the patient (or if unable, by the patient's family).  Yes   Patient/family informed of their freedom to choose among providers that offer the needed level of care, that participate in Medicare, Medicaid or managed care program needed by the patient, have an available bed and are willing to accept the patient.  Yes   Patient/family informed of [facility]'s ownership interest in [facility] and [facility], as well as of the fact that they are under no obligation to receive care at these facilities.  PASRR submitted to EDS on       PASRR number received on       Existing PASRR number confirmed on 11/01/17     FL2 transmitted to all facilities in geographic area requested by pt/family on 11/01/17     FL2 transmitted to all facilities within larger geographic area on       Patient informed that his/her managed care company has contracts with or will negotiate with certain facilities, including the following:        Yes   Patient/family informed of bed offers received.  Patient chooses bed at [facility]     Physician recommends and patient chooses bed at      Patient to be transferred to [facility] on 11/05/17.  Patient to be transferred to facility by Ambulance/PTAR     Patient family notified on 11/05/17 of transfer.  Name of family member notified:   Patient daughter [name]     PHYSICIAN Please prepare priority discharge summary, including medications, Please sign FL2     Additional Comment:    _______________________________________________ Alexander Mt, LCSWA 11/05/2017, 9:36 AM

## 2017-11-05 NOTE — Discharge Summary (Signed)
Physician Discharge Summary  Patient ID: ILAI HILLER MRN: [id number] DOB/AGE: 03/30/1933 84 y.o.  Admit date: 10/30/2017 Discharge date: 11/05/2017  Discharge Diagnoses Fall  Multiple rib fractures on the left with small hemopneumothorax - hemopneumothorax        Resolved Dementia Anemia, chronic Neutropenia, chronic  Consultants None  Procedures None  Hospital Course: 84 y.o. male transfered from [facility] after fall today. He has had a couple of falls recently and some changes since Namenda was started not long ago. Family thinks this has made things worse for him. He complains of left sided chest pain. No SOB. Has recent treatment for UTI. Patient was admitted to the trauma service for pain control and PT/OT. Follow up CXR showed no pneumothorax. PT/OT evaluated patient and recommended 24 hour supervision with continued therapies. Initially it was thought that patient could return to his memory care facility with private sitters and home health, but after reevaluation felt that he would need SNF placement. [place] was held during hospital admission per family request, will leave decision of whether or not to restart this medication to family and prescribing provider. Patient was discharged 11/04/17 but had an episode of transient hypotension which resolved on its own without intervention.   On 11/05/17, he will be discharged to SNF in stable condition. He may follow up with his primary care provider in a few weeks and call trauma clinic as needed.   Physical Exam: Gen:NAD, sitting in bed Card: Regular rate and rhythm Pulm: Normal effort, clear to auscultation bilaterally Skin: warm and dry, no rashes Extremities: no edema   Allergies as of 11/05/2017      Reactions   Aspirin Other (See Comments)   Chicken Allergy Nausea And Vomiting   Unknown   Other Nausea And Vomiting   [place] per daughter       Medication List    STOP taking these medications   memantine 5 MG  tablet Commonly known as:  NAMENDA     TAKE these medications   acetaminophen 325 MG tablet Commonly known as:  TYLENOL Take 2 tablets (650 mg total) by mouth every 6 (six) hours as needed for mild pain. What changed:    when to take this  reasons to take this   ALPRAZolam 0.5 MG tablet Commonly known as:  XANAX Take 1 tablet (0.5 mg total) by mouth at bedtime as needed for anxiety.   donepezil 10 MG tablet Commonly known as:  ARICEPT take 1 tablet by mouth once daily   FLUoxetine 20 MG tablet Commonly known as:  PROZAC Take 2 tablets (40 mg total) by mouth daily. 2 tab of 20 mg once a day. What changed:  additional instructions   hydrOXYzine 10 MG tablet Commonly known as:  ATARAX/VISTARIL take 1 tablet by mouth at bedtime   Melatonin 5 MG Tabs Take 1 tablet (5 mg total) by mouth at bedtime.   multivitamin with minerals Tabs tablet Take 1 tablet by mouth daily.   QUEtiapine 25 MG tablet Commonly known as:  SEROQUEL Take 1 tablet (25 mg total) by mouth at bedtime.   traMADol 50 MG tablet Commonly known as:  ULTRAM Take 1 tablet (50 mg total) by mouth every 6 (six) hours as needed for moderate pain. What changed:  reasons to take this         Contact information for follow-up providers    [place], Dahlia Client, [place]. Call.   Specialty:  Internal Medicine Why:  Call to follow up in  3-4 weeks for rib fractures if needed. Contact information: [place] [address] [phone]        [place]. Call.   Why:  Call as needed.  Contact information: [place] [phone]           Contact information for after-discharge care    Destination    [facility] Follow up.   Service:  Skilled Nursing Contact information: [place] [phone]                  Signed: Brigid Re ,  [facility] 11/05/2017, 7:25 AM Pager: [phone] Trauma: [phone] Mon-Fri 7:00 am-4:30 pm Sat-Sun 7:00 am-11:30 am

## 2017-11-05 NOTE — Progress Notes (Signed)
Discharge to [facility] transported by [facility].

## 2017-11-05 NOTE — Progress Notes (Signed)
Report given to Claiborne Billings, RN ([place].

## 2017-11-05 NOTE — Social Work (Addendum)
CSW has f/u with SNF, they are okay to take pt still today.  CSW called daughter, she is off of work today and okay with pt transferring via PTAR at any time today.   9:49am- Clinical Social Worker facilitated patient discharge including contacting patient family and facility to confirm patient discharge plans.  Clinical information faxed to facility and family agreeable with plan.  CSW arranged ambulance transport via [place] to [facility] at 10:30am. Room 508.  RN to call [phone] with report  prior to discharge.  Clinical Social Worker will sign off for now as social work intervention is no longer needed. Please consult [place] again if new need arises.  Alexander Mt, [place] Social Worker

## 2017-11-08 ENCOUNTER — Ambulatory Visit: Payer: Medicare Other | Admitting: Neurology

## 2017-11-08 ENCOUNTER — Telehealth: Payer: Self-pay | Admitting: Neurology

## 2017-11-08 NOTE — Telephone Encounter (Signed)
Pt was no show to apt but I did call the daughter on Thursday to attempt to cancel because he was recently seen by the NP. I dont think this apt was cancelled or something. There may have been confusion on whether the patient was suppose to have come to this apt since I called to see on thurs if they wanted to cancel. I left a message on the daughters voice mail.

## 2017-12-12 ENCOUNTER — Emergency Department: Payer: Medicare Other

## 2017-12-12 ENCOUNTER — Other Ambulatory Visit: Payer: Self-pay

## 2017-12-12 ENCOUNTER — Encounter: Payer: Self-pay | Admitting: Emergency Medicine

## 2017-12-12 ENCOUNTER — Inpatient Hospital Stay
Admission: EM | Admit: 2017-12-12 | Discharge: 2017-12-14 | DRG: 194 | Disposition: A | Discharge status: Home Health | Payer: Medicare Other | Attending: Internal Medicine | Admitting: Internal Medicine

## 2017-12-12 DIAGNOSIS — W19XXXA Unspecified fall, initial encounter: Secondary | ICD-10-CM | POA: Diagnosis present

## 2017-12-12 DIAGNOSIS — I951 Orthostatic hypotension: Secondary | ICD-10-CM | POA: Diagnosis present

## 2017-12-12 DIAGNOSIS — G8929 Other chronic pain: Secondary | ICD-10-CM | POA: Diagnosis present

## 2017-12-12 DIAGNOSIS — Z79899 Other long term (current) drug therapy: Secondary | ICD-10-CM | POA: Diagnosis not present

## 2017-12-12 DIAGNOSIS — Z886 Allergy status to analgesic agent status: Secondary | ICD-10-CM | POA: Diagnosis not present

## 2017-12-12 DIAGNOSIS — Z9119 Patient's noncompliance with other medical treatment and regimen: Secondary | ICD-10-CM

## 2017-12-12 DIAGNOSIS — R531 Weakness: Secondary | ICD-10-CM

## 2017-12-12 DIAGNOSIS — I10 Essential (primary) hypertension: Secondary | ICD-10-CM | POA: Diagnosis present

## 2017-12-12 DIAGNOSIS — J181 Lobar pneumonia, unspecified organism: Secondary | ICD-10-CM | POA: Diagnosis present

## 2017-12-12 DIAGNOSIS — F3341 Major depressive disorder, recurrent, in partial remission: Secondary | ICD-10-CM | POA: Diagnosis present

## 2017-12-12 DIAGNOSIS — E876 Hypokalemia: Secondary | ICD-10-CM | POA: Diagnosis not present

## 2017-12-12 DIAGNOSIS — R296 Repeated falls: Secondary | ICD-10-CM | POA: Diagnosis present

## 2017-12-12 DIAGNOSIS — S2242XA Multiple fractures of ribs, left side, initial encounter for closed fracture: Secondary | ICD-10-CM | POA: Diagnosis present

## 2017-12-12 DIAGNOSIS — Z87891 Personal history of nicotine dependence: Secondary | ICD-10-CM

## 2017-12-12 DIAGNOSIS — Z96642 Presence of left artificial hip joint: Secondary | ICD-10-CM | POA: Diagnosis present

## 2017-12-12 DIAGNOSIS — D638 Anemia in other chronic diseases classified elsewhere: Secondary | ICD-10-CM | POA: Diagnosis present

## 2017-12-12 DIAGNOSIS — K219 Gastro-esophageal reflux disease without esophagitis: Secondary | ICD-10-CM | POA: Diagnosis present

## 2017-12-12 DIAGNOSIS — G4730 Sleep apnea, unspecified: Secondary | ICD-10-CM | POA: Diagnosis present

## 2017-12-12 DIAGNOSIS — D72819 Decreased white blood cell count, unspecified: Secondary | ICD-10-CM | POA: Diagnosis present

## 2017-12-12 DIAGNOSIS — F0281 Dementia in other diseases classified elsewhere with behavioral disturbance: Secondary | ICD-10-CM | POA: Diagnosis present

## 2017-12-12 DIAGNOSIS — R402413 Glasgow coma scale score 13-15, at hospital admission: Secondary | ICD-10-CM | POA: Diagnosis present

## 2017-12-12 DIAGNOSIS — Y95 Nosocomial condition: Secondary | ICD-10-CM | POA: Diagnosis present

## 2017-12-12 DIAGNOSIS — R4789 Other speech disturbances: Secondary | ICD-10-CM | POA: Diagnosis present

## 2017-12-12 DIAGNOSIS — G3183 Dementia with Lewy bodies: Secondary | ICD-10-CM | POA: Diagnosis present

## 2017-12-12 DIAGNOSIS — J189 Pneumonia, unspecified organism: Secondary | ICD-10-CM | POA: Diagnosis present

## 2017-12-12 DIAGNOSIS — D61818 Other pancytopenia: Secondary | ICD-10-CM | POA: Diagnosis present

## 2017-12-12 DIAGNOSIS — Z91018 Allergy to other foods: Secondary | ICD-10-CM

## 2017-12-12 DIAGNOSIS — D693 Immune thrombocytopenic purpura: Secondary | ICD-10-CM | POA: Diagnosis present

## 2017-12-12 LAB — CBC WITH DIFFERENTIAL/PLATELET
Basophils Absolute: 0 10*3/uL (ref 0–0.1)
Basophils Relative: 0 %
EOS ABS: 0 10*3/uL (ref 0–0.7)
Eosinophils Relative: 0 %
HEMATOCRIT: 30.9 % — AB (ref 40.0–52.0)
HEMOGLOBIN: 10.1 g/dL — AB (ref 13.0–18.0)
LYMPHS ABS: 0.3 10*3/uL — AB (ref 1.0–3.6)
Lymphocytes Relative: 12 %
MCH: 35.9 pg — AB (ref 26.0–34.0)
MCHC: 32.8 g/dL (ref 32.0–36.0)
MCV: 109.6 fL — ABNORMAL HIGH (ref 80.0–100.0)
MONO ABS: 0.3 10*3/uL (ref 0.2–1.0)
MONOS PCT: 11 %
Neutro Abs: 2.3 10*3/uL (ref 1.4–6.5)
Neutrophils Relative %: 77 %
Platelets: 87 10*3/uL — ABNORMAL LOW (ref 150–440)
RBC: 2.82 MIL/uL — ABNORMAL LOW (ref 4.40–5.90)
RDW: 14.7 % — AB (ref 11.5–14.5)
WBC: 2.9 10*3/uL — ABNORMAL LOW (ref 3.8–10.6)

## 2017-12-12 LAB — URINALYSIS, COMPLETE (UACMP) WITH MICROSCOPIC
BACTERIA UA: NONE SEEN
BILIRUBIN URINE: NEGATIVE
Glucose, UA: NEGATIVE mg/dL
Hgb urine dipstick: NEGATIVE
KETONES UR: NEGATIVE mg/dL
LEUKOCYTES UA: NEGATIVE
Nitrite: NEGATIVE
Protein, ur: NEGATIVE mg/dL
Specific Gravity, Urine: 1.024 (ref 1.005–1.030)
pH: 6 (ref 5.0–8.0)

## 2017-12-12 LAB — COMPREHENSIVE METABOLIC PANEL
ALBUMIN: 3.4 g/dL — AB (ref 3.5–5.0)
ALK PHOS: 79 U/L (ref 38–126)
ALT: 21 U/L (ref 17–63)
AST: 28 U/L (ref 15–41)
Anion gap: 7 (ref 5–15)
BUN: 21 mg/dL — ABNORMAL HIGH (ref 6–20)
CALCIUM: 8.4 mg/dL — AB (ref 8.9–10.3)
CHLORIDE: 108 mmol/L (ref 101–111)
CO2: 26 mmol/L (ref 22–32)
CREATININE: 0.95 mg/dL (ref 0.61–1.24)
GFR calc non Af Amer: >60 mL/min (ref >60)
GLUCOSE: 124 mg/dL — AB (ref 65–99)
Potassium: 4 mmol/L (ref 3.5–5.1)
Sodium: 141 mmol/L (ref 135–145)
Total Bilirubin: 0.7 mg/dL (ref 0.3–1.2)
Total Protein: 7.1 g/dL (ref 6.5–8.1)

## 2017-12-12 LAB — INFLUENZA PANEL BY PCR (TYPE A & B)
INFLAPCR: NEGATIVE
Influenza B By PCR: NEGATIVE

## 2017-12-12 LAB — TROPONIN I: TROPONIN I: 0.04 ng/mL — AB (ref <0.03)

## 2017-12-12 LAB — MRSA PCR SCREENING: MRSA BY PCR: NEGATIVE

## 2017-12-12 MED ORDER — FLUOXETINE HCL 20 MG PO TABS
40.0000 mg | ORAL_TABLET | Freq: Every day | ORAL | Status: DC
  Administered 2017-12-13: 40 mg via ORAL
  Filled 2017-12-12 (×3): qty 2

## 2017-12-12 MED ORDER — VANCOMYCIN HCL IN DEXTROSE 1-5 GM/200ML-% IV SOLN: 1000.0000 mg | Freq: Once | INTRAVENOUS | Status: DC

## 2017-12-12 MED ORDER — ACETAMINOPHEN 325 MG PO TABS
650.0000 mg | ORAL_TABLET | Freq: Four times a day (QID) | ORAL | Status: DC | PRN
  Administered 2017-12-12 – 2017-12-13 (×3): 650 mg via ORAL
  Filled 2017-12-12 (×3): qty 2

## 2017-12-12 MED ORDER — ONDANSETRON HCL 4 MG/2ML IJ SOLN: 4.0000 mg | Freq: Four times a day (QID) | INTRAMUSCULAR | Status: DC | PRN

## 2017-12-12 MED ORDER — ONDANSETRON HCL 4 MG PO TABS: 4.0000 mg | ORAL_TABLET | Freq: Four times a day (QID) | ORAL | Status: DC | PRN

## 2017-12-12 MED ORDER — CEFTRIAXONE SODIUM 1 G IJ SOLR
1.0000 g | Freq: Once | INTRAMUSCULAR | Status: AC
  Administered 2017-12-12: 1 g via INTRAVENOUS
  Filled 2017-12-12: qty 10

## 2017-12-12 MED ORDER — HYDROXYZINE HCL 10 MG PO TABS
10.0000 mg | ORAL_TABLET | Freq: Every day | ORAL | Status: DC
  Administered 2017-12-12 – 2017-12-13 (×2): 10 mg via ORAL
  Filled 2017-12-12 (×3): qty 1

## 2017-12-12 MED ORDER — MELATONIN 5 MG PO TABS
5.0000 mg | ORAL_TABLET | Freq: Every day | ORAL | Status: DC
  Administered 2017-12-12 – 2017-12-13 (×2): 5 mg via ORAL
  Filled 2017-12-12 (×3): qty 1

## 2017-12-12 MED ORDER — SODIUM CHLORIDE 0.9 % IV SOLN
2.0000 g | Freq: Three times a day (TID) | INTRAVENOUS | Status: DC
  Administered 2017-12-13 – 2017-12-14 (×4): 2 g via INTRAVENOUS
  Filled 2017-12-12 (×8): qty 2

## 2017-12-12 MED ORDER — QUETIAPINE FUMARATE 25 MG PO TABS
25.0000 mg | ORAL_TABLET | Freq: Every day | ORAL | Status: DC
  Administered 2017-12-12 – 2017-12-13 (×2): 25 mg via ORAL
  Filled 2017-12-12 (×2): qty 1

## 2017-12-12 MED ORDER — AZITHROMYCIN 500 MG IV SOLR
500.0000 mg | Freq: Once | INTRAVENOUS | Status: AC
  Administered 2017-12-12: 500 mg via INTRAVENOUS
  Filled 2017-12-12: qty 500

## 2017-12-12 MED ORDER — ACETAMINOPHEN 650 MG RE SUPP: 650.0000 mg | Freq: Four times a day (QID) | RECTAL | Status: DC | PRN

## 2017-12-12 MED ORDER — ENOXAPARIN SODIUM 40 MG/0.4ML ~~LOC~~ SOLN
40.0000 mg | SUBCUTANEOUS | Status: DC
  Administered 2017-12-12: 16:00:00 40 mg via SUBCUTANEOUS
  Filled 2017-12-12: qty 0.4

## 2017-12-12 MED ORDER — DONEPEZIL HCL 10 MG PO TABS
10.0000 mg | ORAL_TABLET | Freq: Every day | ORAL | Status: DC
  Administered 2017-12-13: 10 mg via ORAL
  Filled 2017-12-12 (×3): qty 1

## 2017-12-12 MED ORDER — CALCIUM CARBONATE-VITAMIN D 500-200 MG-UNIT PO TABS
1.0000 | ORAL_TABLET | Freq: Every day | ORAL | Status: DC
  Administered 2017-12-13: 10:00:00 1 via ORAL
  Filled 2017-12-12: qty 1

## 2017-12-12 MED ORDER — SODIUM CHLORIDE 0.9 % IV SOLN
1250.0000 mg | INTRAVENOUS | Status: DC
  Administered 2017-12-12: 1250 mg via INTRAVENOUS
  Filled 2017-12-12 (×2): qty 1250

## 2017-12-12 MED ORDER — TRAMADOL HCL 50 MG PO TABS: 50.0000 mg | ORAL_TABLET | Freq: Four times a day (QID) | ORAL | Status: DC | PRN

## 2017-12-12 NOTE — Progress Notes (Signed)
Pharmacy Antibiotic Note  John Summers is a 84 y.o. male admitted on 12/12/2017 with pneumonia.  Pharmacy has been consulted for cefepime and vancomycin dosing.  Plan: Vancomycin 1250mg   IV every 24 hours.  Goal trough 15-20 mcg/mL. cefepime 2gm iv q8h  Weight: 166 lb (75.3 kg)  Temp (24hrs), Avg:100.1 F (37.8 C), Min:99.5 F (37.5 C), Max:100.6 F (38.1 C)  Recent Labs  Lab 12/12/17 1029  WBC 2.9*  CREATININE 0.95    Estimated Creatinine Clearance: 60.8 mL/min (by C-G formula based on SCr of 0.95 mg/dL).    Allergies  Allergen Reactions  . Aspirin Other (See Comments)  . Chicken Allergy Nausea And Vomiting    Unknown   . Nsaids     Low platelets  . Other Nausea And Vomiting    [place] per daughter     Antimicrobials this admission: Anti-infectives (From admission, onward)   Start     Dose/Rate Route Frequency Ordered Stop   12/12/17 1700  vancomycin (VANCOCIN) 1,250 mg in sodium chloride 0.9 % 250 mL IVPB     1,250 mg 166.7 mL/hr over 90 Minutes Intravenous Every 24 hours 12/12/17 1319     12/12/17 1400  ceFEPIme (MAXIPIME) 2 g in sodium chloride 0.9 % 100 mL IVPB     2 g 200 mL/hr over 30 Minutes Intravenous Every 8 hours 12/12/17 1239     12/12/17 1245  vancomycin (VANCOCIN) IVPB 1000 mg/200 mL premix     1,000 mg 200 mL/hr over 60 Minutes Intravenous  Once 12/12/17 1239     12/12/17 1145  cefTRIAXone (ROCEPHIN) 1 g in sodium chloride 0.9 % 100 mL IVPB     1 g 200 mL/hr over 30 Minutes Intravenous  Once 12/12/17 1139 12/12/17 1250   12/12/17 1145  azithromycin (ZITHROMAX) 500 mg in sodium chloride 0.9 % 250 mL IVPB     500 mg 250 mL/hr over 60 Minutes Intravenous  Once 12/12/17 1139        Microbiology results: No results found for this or any previous visit (from the past 240 hour(s)).   Thank you for allowing pharmacy to be a part of this patient's care.  Donna Christen Naria Abbey 12/12/2017 1:23 PM

## 2017-12-12 NOTE — Plan of Care (Signed)
Pt admitted from the ED. VSS. Tylenol given once for intermittent L rib cage pain with improvement.

## 2017-12-12 NOTE — ED Notes (Signed)
Per Dr. Cinda Quest, only one set of blood cultures needed.

## 2017-12-12 NOTE — ED Provider Notes (Signed)
[facility] Emergency Department Provider Note   ____________________________________________   First MD Initiated Contact with Patient 12/12/17 1003     (approximate)  I have reviewed the triage vital signs and the nursing notes.   HISTORY  Chief Complaint Weakness  History somewhat limited by dementia  HPI John Summers is a 84 y.o. male who reportedly fell last week and has some rib fractures on the left side. For the last 2 days has been feeling weak having a cough and running a fever. He got worse today as far as his weakness. No other history is currently available.   Past Medical History:  Diagnosis Date  . Colon adenocarcinoma ([place])   . Complication of anesthesia    severe confusion  . GERD (gastroesophageal reflux disease)   . History of colon polyps   . History of gallstones   . Hx of transfusion of platelets   . Hypertension   . ITP (idiopathic thrombocytopenic purpura)    Dr. Grayland Ormond follows  . Kidney stones   . Lewy body dementia with behavioral disturbance   . Major depressive disorder, recurrent episode, in partial remission with anxious distress ([place])   . Pancreatitis 2/11  . Pollen allergies   . Ribs, multiple fractures   . Skin cancer    ears  . Sleep apnea    "won't wear mask anymore" (12/22/2012)  . Unspecified essential hypertension     Patient Active Problem List   Diagnosis Date Noted  . Pneumonia 12/12/2017  . Malnutrition of moderate degree 11/03/2017  . Rib fractures 10/30/2017  . 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 Pancytopenia, acquired ([place]) 08/02/2017  . Visual hallucination 08/02/2017  . Generalized osteoarthritis 03/05/2017  . AF (paroxysmal atrial fibrillation) ([place]) 02/01/2017  . Near syncope 01/06/2017  . Gait disturbance 12/30/2016  . MGUS (monoclonal gammopathy of unknown significance) 05/10/2016  . Pancreatic mass 04/24/2016  . Thrombocytopenia ([place]) 10/04/2015  .  Uncontrolled REM sleep behavior disorder 11/16/2014  . Lewy body dementia with behavioral disturbance 11/16/2014  . Tremor of right hand 11/16/2014  . Major depressive disorder, recurrent episode, in partial remission with anxious distress ([place])   . REM sleep behavior disorder 12/07/2013  . Incisional hernia, without obstruction or gangrene 11/14/2012  . Adenocarcinoma of cecum ([place]) 03/19/2012  . GERD (gastroesophageal reflux disease) 03/19/2012  . Sleep apnea   . Essential hypertension     Past Surgical History:  Procedure Laterality Date  . CARDIAC CATHETERIZATION  2000's   "tx'd w/acid reflux RX" (12/22/2012)  . CHOLECYSTECTOMY  2011  . ESOPHAGEAL DILATION  1990's?  Marland Kitchen HERNIA REPAIR  12/22/2012   IHR w/mesh  . INGUINAL HERNIA REPAIR Left [id number]  . INSERTION OF MESH N/A 12/22/2012   Procedure: INSERTION OF MESH;  Surgeon: Harl Bowie, MD;  Location: [facility];  Service: General;  Laterality: N/A;  . LITHOTRIPSY  2008   x2  . PARTIAL COLECTOMY  03/21/2012   Procedure: PARTIAL COLECTOMY;  Surgeon: Harl Bowie, MD;  Location: [facility];  Service: General;  Laterality: Right;  . PILONIDAL CYST EXCISION  1960's?  Marland Kitchen SKIN CANCER EXCISION     "face; ?left ear" (12/22/2012)  . TONSILLECTOMY AND ADENOIDECTOMY  1946  . TOTAL HIP ARTHROPLASTY Left ~ 2002  . UPPER GASTROINTESTINAL ENDOSCOPY    . VENTRAL HERNIA REPAIR N/A 12/22/2012   Procedure: HERNIA REPAIR INCISIONAL ;  Surgeon: Harl Bowie, MD;  Location: [facility];  Service: General;  Laterality: N/A;  Prior to Admission medications   Medication Sig Start Date End Date Taking? Authorizing Provider  acetaminophen (TYLENOL) 325 MG tablet Take 2 tablets (650 mg total) by mouth every 6 (six) hours as needed for mild pain. Patient taking differently: Take 500 mg by mouth 3 (three) times daily.  11/04/17  Yes Rayburn, Claiborne Billings A, PA-C  donepezil (ARICEPT) 10 MG tablet take 1 tablet by mouth once daily 11/09/16  Yes Dohmeier, Asencion Partridge, MD    FLUoxetine (PROZAC) 20 MG tablet Take 2 tablets (40 mg total) by mouth daily. 2 tab of 20 mg once a day. Patient taking differently: Take 40 mg by mouth daily.  04/19/17  Yes Dohmeier, Asencion Partridge, MD  hydrOXYzine (ATARAX/VISTARIL) 10 MG tablet take 1 tablet by mouth at bedtime 12/07/16  Yes Dohmeier, Asencion Partridge, MD  Melatonin 5 MG TABS Take 1 tablet (5 mg total) by mouth at bedtime. 04/19/17  Yes Dohmeier, Asencion Partridge, MD  QUEtiapine (SEROQUEL) 25 MG tablet Take 1 tablet (25 mg total) by mouth at bedtime. 08/30/17  Yes Dohmeier, Asencion Partridge, MD  traMADol (ULTRAM) 50 MG tablet Take 1 tablet (50 mg total) by mouth every 6 (six) hours as needed for moderate pain. 11/04/17  Yes Rayburn, Floyce Stakes, PA-C  ALPRAZolam (XANAX) 0.5 MG tablet Take 1 tablet (0.5 mg total) by mouth at bedtime as needed for anxiety. Patient not taking: Reported on 12/12/2017 02/02/17   Henreitta Leber, MD  Multiple Vitamin (MULTIVITAMIN WITH MINERALS) TABS tablet Take 1 tablet by mouth daily. Patient not taking: Reported on 12/12/2017 11/04/17   Rayburn, Claiborne Billings A, PA-C    Allergies Aspirin; Chicken allergy; Nsaids; and Other  Family History  Problem Relation Age of Onset  . Colon cancer Mother   . Heart disease Mother   . Hypertension Mother   . Alcohol abuse Father   . Esophageal cancer Neg Hx   . Rectal cancer Neg Hx   . Stomach cancer Neg Hx     Social History Social History   Tobacco Use  . Smoking status: Former Smoker    Types: Pipe  . Smokeless tobacco: Never Used  . Tobacco comment: i4/06/2013 "only smoked pipe n college 1958"  Substance Use Topics  . Alcohol use: No    Alcohol/week: 0.0 oz  . Drug use: No    Review of Systems  Constitutional:  fever Eyes: No visual changes. ENT: No sore throat. Cardiovascular: chest pain in area of broken ribs. Respiratory: Denies shortness of breath. Gastrointestinal: No abdominal pain.  No nausea, no vomiting.  No diarrhea.  No constipation. Genitourinary: Negative for  dysuria. Musculoskeletal: Negative for back pain. Skin: Negative for rash. Neurological: Negative for headaches, focal weakness  ____________________________________________   PHYSICAL EXAM:  VITAL SIGNS: ED Triage Vitals  Enc Vitals Group     BP --      Pulse Rate 12/12/17 1008 87     Resp 12/12/17 1008 18     Temp 12/12/17 1008 (!) 100.6 F (38.1 C)     Temp Source 12/12/17 1008 Rectal     SpO2 12/12/17 1004 94 %     Weight 12/12/17 1010 166 lb (75.3 kg)     Height --      Head Circumference --      Peak Flow --      Pain Score --      Pain Loc --      Pain Edu? --      Excl. in [place]? --     Constitutional: Alert  and oriented. Well appearing and in no acute distress. Eyes: Conjunctivae are normal.  Head: Atraumatic. Nose: No congestion/rhinnorhea. Mouth/Throat: Mucous membranes are moist.  Oropharynx non-erythematous. Neck: No stridor.  Cardiovascular: Normal rate, regular rhythm. Grossly normal heart sounds.  Good peripheral circulation. Respiratory: Normal respiratory effort.  No retractions. Lungs crackles especially on the right side Gastrointestinal: Soft and nontender. No distention. No abdominal bruits. No CVA tenderness. Musculoskeletal: No lower extremity tenderness nor edema.  No joint effusions. Neurologic:  Normal speech and language. No gross focal neurologic deficits are appreciated.  Skin:  Skin is warm, dry and intact. No rash noted. Psychiatric: Mood and affect are normal. Speech and behavior are normal.  ____________________________________________   LABS (all labs ordered are listed, but only abnormal results are displayed)  Labs Reviewed  COMPREHENSIVE METABOLIC PANEL - Abnormal; Notable for the following components:      Result Value   Glucose, Bld 124 (*)    BUN 21 (*)    Calcium 8.4 (*)    Albumin 3.4 (*)    All other components within normal limits  CBC WITH DIFFERENTIAL/PLATELET - Abnormal; Notable for the following components:   WBC  2.9 (*)    RBC 2.82 (*)    Hemoglobin 10.1 (*)    HCT 30.9 (*)    MCV 109.6 (*)    MCH 35.9 (*)    RDW 14.7 (*)    Platelets 87 (*)    Lymphs Abs 0.3 (*)    All other components within normal limits  URINALYSIS, COMPLETE (UACMP) WITH MICROSCOPIC - Abnormal; Notable for the following components:   Color, Urine YELLOW (*)    APPearance CLEAR (*)    Squamous Epithelial / LPF 0-5 (*)    All other components within normal limits  TROPONIN I - Abnormal; Notable for the following components:   Troponin I 0.04 (*)    All other components within normal limits  CULTURE, BLOOD (ROUTINE X 2)  CULTURE, BLOOD (ROUTINE X 2)  MRSA PCR SCREENING  INFLUENZA PANEL BY PCR (TYPE A & B)   ____________________________________________  EKG  EKG is still pending  EKG is now available already interpreted by me shows normal sinus rhythm rate of 76 left axis flipped T's inferiorly laterally which are new from February. ____________________________________________  RADIOLOGY  chest x-ray shows pneumonia per radiology I reviewed the filmsultrasound the abdomen is negative Official radiology report(s): Dg Chest 2 View  Result Date: 12/12/2017 CLINICAL DATA:  Fall.  Left-sided rib fractures.  Fever and cough. EXAM: CHEST - 2 VIEW COMPARISON:  10/31/2017 FINDINGS: The heart size and mediastinal contours are within normal limits. There is diminished aeration to the left lower lobe suspicious for pneumonia. Left lateral rib fracture deformities are again noted. The visualized skeletal structures are unremarkable. IMPRESSION: 1. New left lower lobe opacities worrisome for pneumonia. 2. Left rib fractures as before. Electronically Signed   By: Kerby Moors M.D.   On: 12/12/2017 11:23   US Abdomen Complete  Result Date: 12/12/2017 CLINICAL DATA:  84 year old male with abdominal pain and LEFT-sided bruising following fall. History of cholecystectomy. EXAM: ABDOMEN ULTRASOUND COMPLETE COMPARISON:  None.  FINDINGS: Portions of the abdomen not visualized secondary to bowel gas and patient's inability to move. Gallbladder: Not visualized compatible with cholecystectomy. Common bile duct: Diameter: 8 mm. No intrahepatic or extrahepatic biliary dilatation. Liver: No focal lesion identified. Within normal limits in parenchymal echogenicity. Portal vein is patent on color Doppler imaging with normal direction of blood flow  towards the liver. IVC: Not visualized Pancreas: Not visualized Spleen: Unremarkable. No definite focal splenic abnormality or adjacent fluid noted. Right Kidney: Length: 11.6 cm. Echogenicity within normal limits. No mass or hydronephrosis visualized. Left Kidney: Length: 11.9 cm. Echogenicity within normal limits. No mass or hydronephrosis visualized. Abdominal aorta: Not visualized Other findings: None. IMPRESSION: 1. No evidence of acute or significant abnormality. 2. IVC, pancreas and aorta not visualized. Electronically Signed   By: Margarette Canada M.D.   On: 12/12/2017 11:15    ____________________________________________   PROCEDURES  Procedure(s) performed:   Procedures  Critical Care performed:   ____________________________________________   INITIAL IMPRESSION / ASSESSMENT AND PLAN / ED COURSE  patient is too weak to walk at home he has pancytopenia and fever and pneumonia. I do not believe it would be safe to send him home.         ____________________________________________   FINAL CLINICAL IMPRESSION(S) / ED DIAGNOSES  Final diagnoses:  Weakness  Community acquired pneumonia of left lower lobe of lung [facility])     ED Discharge Orders    None       Note:  This document was prepared using Dragon voice recognition software and may include unintentional dictation errors.    Nena Polio, MD 12/12/17 [phone]

## 2017-12-12 NOTE — H&P (Signed)
[facility] [place] NAME: John Summers    MR#:  [id number]  DATE OF BIRTH:  Feb 10, 1933  DATE OF ADMISSION:  12/12/2017  PRIMARY CARE PHYSICIAN: Owens Loffler, MD   REQUESTING/REFERRING PHYSICIAN: Dr. Conni Slipper  CHIEF COMPLAINT:   Chief Complaint  Patient presents with  . Weakness    HISTORY OF PRESENT ILLNESS:  John Summers  is a 83 y.o. male with multiple medical problems including ITP, GERD, history of colon cancer, parkinsonism, prior falls, depression and anxiety brought in from home secondary to worsening cough and fevers. Patient has garbled speech and very slow to respond.  Most of the history is obtained from his daughter at bedside.  Patient was at an assisted living facility was having multiple falls, was recently at a nursing home for short-term rehab and had 3 falls over there.  Also had left-sided rib fractures recently because of the falls.  He was recently taken home a week ago and was relatively doing well though he had thick congested cough.  However since last night his cough got worse, complained of some shortness of breath and was also having fevers and chills of brought to the emergency room.  Chest x-ray here shows left lower lobe pneumonia.  PAST MEDICAL HISTORY:   Past Medical History:  Diagnosis Date  . Colon adenocarcinoma ([place])   . Complication of anesthesia    severe confusion  . GERD (gastroesophageal reflux disease)   . History of colon polyps   . History of gallstones   . Hx of transfusion of platelets   . Hypertension   . ITP (idiopathic thrombocytopenic purpura)    Dr. Grayland Ormond follows  . Kidney stones   . Lewy body dementia with behavioral disturbance   . Major depressive disorder, recurrent episode, in partial remission with anxious distress ([place])   . Pancreatitis 2/11  . Pollen allergies   . Ribs, multiple fractures   . Skin cancer    ears  . Sleep apnea    "won't wear mask  anymore" (12/22/2012)  . Unspecified essential hypertension     PAST SURGICAL HISTORY:   Past Surgical History:  Procedure Laterality Date  . CARDIAC CATHETERIZATION  2000's   "tx'd w/acid reflux RX" (12/22/2012)  . CHOLECYSTECTOMY  2011  . ESOPHAGEAL DILATION  1990's?  Marland Kitchen HERNIA REPAIR  12/22/2012   IHR w/mesh  . INGUINAL HERNIA REPAIR Left [id number]  . INSERTION OF MESH N/A 12/22/2012   Procedure: INSERTION OF MESH;  Surgeon: Harl Bowie, MD;  Location: [facility];  Service: General;  Laterality: N/A;  . LITHOTRIPSY  2008   x2  . PARTIAL COLECTOMY  03/21/2012   Procedure: PARTIAL COLECTOMY;  Surgeon: Harl Bowie, MD;  Location: [facility];  Service: General;  Laterality: Right;  . PILONIDAL CYST EXCISION  1960's?  Marland Kitchen SKIN CANCER EXCISION     "face; ?left ear" (12/22/2012)  . TONSILLECTOMY AND ADENOIDECTOMY  1946  . TOTAL HIP ARTHROPLASTY Left ~ 2002  . UPPER GASTROINTESTINAL ENDOSCOPY    . VENTRAL HERNIA REPAIR N/A 12/22/2012   Procedure: HERNIA REPAIR INCISIONAL ;  Surgeon: Harl Bowie, MD;  Location: [facility];  Service: General;  Laterality: N/A;    SOCIAL HISTORY:   Social History   Tobacco Use  . Smoking status: Former Smoker    Types: Pipe  . Smokeless tobacco: Never Used  . Tobacco comment: i4/06/2013 "only smoked pipe n college 1958"  Substance Use Topics  .  Alcohol use: No    Alcohol/week: 0.0 oz    FAMILY HISTORY:   Family History  Problem Relation Age of Onset  . Colon cancer Mother   . Heart disease Mother   . Hypertension Mother   . Alcohol abuse Father   . Esophageal cancer Neg Hx   . Rectal cancer Neg Hx   . Stomach cancer Neg Hx     DRUG ALLERGIES:   Allergies  Allergen Reactions  . Aspirin Other (See Comments)  . Chicken Allergy Nausea And Vomiting    Unknown   . Nsaids     Low platelets  . Other Nausea And Vomiting    [place] per daughter     REVIEW OF SYSTEMS:   Review of Systems  Constitutional: Positive for chills, fever  and malaise/fatigue. Negative for weight loss.  HENT: Positive for hearing loss. Negative for ear discharge, ear pain and nosebleeds.   Eyes: Positive for pain. Negative for blurred vision, double vision and photophobia.  Respiratory: Positive for cough and shortness of breath. Negative for hemoptysis and wheezing.   Cardiovascular: Negative for chest pain, palpitations, orthopnea and leg swelling.  Gastrointestinal: Negative for abdominal pain, constipation, diarrhea, melena, nausea and vomiting.  Genitourinary: Negative for dysuria and urgency.  Musculoskeletal: Positive for back pain and myalgias. Negative for neck pain.  Skin: Negative for rash.  Neurological: Negative for dizziness, tingling, sensory change, speech change, focal weakness and headaches.  Endo/Heme/Allergies: Does not bruise/bleed easily.  Psychiatric/Behavioral: Negative for depression.    MEDICATIONS AT HOME:   Prior to Admission medications   Medication Sig Start Date End Date Taking? Authorizing Provider  acetaminophen (TYLENOL) 325 MG tablet Take 2 tablets (650 mg total) by mouth every 6 (six) hours as needed for mild pain. Patient taking differently: Take 500 mg by mouth 3 (three) times daily.  11/04/17  Yes Rayburn, Claiborne Billings A, PA-C  donepezil (ARICEPT) 10 MG tablet take 1 tablet by mouth once daily 11/09/16  Yes Dohmeier, Asencion Partridge, MD  FLUoxetine (PROZAC) 20 MG tablet Take 2 tablets (40 mg total) by mouth daily. 2 tab of 20 mg once a day. Patient taking differently: Take 40 mg by mouth daily.  04/19/17  Yes Dohmeier, Asencion Partridge, MD  hydrOXYzine (ATARAX/VISTARIL) 10 MG tablet take 1 tablet by mouth at bedtime 12/07/16  Yes Dohmeier, Asencion Partridge, MD  Melatonin 5 MG TABS Take 1 tablet (5 mg total) by mouth at bedtime. 04/19/17  Yes Dohmeier, Asencion Partridge, MD  QUEtiapine (SEROQUEL) 25 MG tablet Take 1 tablet (25 mg total) by mouth at bedtime. 08/30/17  Yes Dohmeier, Asencion Partridge, MD  traMADol (ULTRAM) 50 MG tablet Take 1 tablet (50 mg total) by  mouth every 6 (six) hours as needed for moderate pain. 11/04/17  Yes Rayburn, Floyce Stakes, PA-C  ALPRAZolam (XANAX) 0.5 MG tablet Take 1 tablet (0.5 mg total) by mouth at bedtime as needed for anxiety. Patient not taking: Reported on 12/12/2017 02/02/17   Henreitta Leber, MD  Multiple Vitamin (MULTIVITAMIN WITH MINERALS) TABS tablet Take 1 tablet by mouth daily. Patient not taking: Reported on 12/12/2017 11/04/17   Rayburn, Claiborne Billings A, PA-C      VITAL SIGNS:  Blood pressure 136/72, pulse 76, temperature (!) 100.6 F (38.1 C), temperature source Rectal, resp. rate 17, weight 75.3 kg (166 lb), SpO2 93 %.  PHYSICAL EXAMINATION:   Physical Exam  GENERAL:  84 y.o.-year-old elderly patient lying in the bed with no acute distress.  EYES: Pupils equal, round, reactive to light and accommodation.  No scleral icterus. Extraocular muscles intact.  HEENT: Head atraumatic, normocephalic. Oropharynx and nasopharynx clear.  NECK:  Supple, no jugular venous distention. No thyroid enlargement, no tenderness.  LUNGS: Normal breath sounds bilaterally, no wheezing, rales,rhonchi or crepitation. Significantly decreased basilar breath sounds.  No use of accessory muscles of respiration.  CARDIOVASCULAR: S1, S2 normal. No  rubs, or gallops. 2/6 systolic murmur present ABDOMEN: Soft, nontender, nondistended. Bowel sounds present. No organomegaly or mass.  EXTREMITIES: No pedal edema, cyanosis, or clubbing.  NEUROLOGIC: Cranial nerves II through XII are intact.  Thick and difficult to understand speech.  No new focal motor deficits.  Follows simple commands.. Sensation intact. Gait not checked. Global weakness noted, stiff on exam. PSYCHIATRIC: The patient is alert and oriented x 1-2.  SKIN: No obvious rash, lesion, or ulcer.   LABORATORY PANEL:   CBC Recent Labs  Lab 12/12/17 1029  WBC 2.9*  HGB 10.1*  HCT 30.9*  PLT 87*    ------------------------------------------------------------------------------------------------------------------  Chemistries  Recent Labs  Lab 12/12/17 1029  NA 141  K 4.0  CL 108  CO2 26  GLUCOSE 124*  BUN 21*  CREATININE 0.95  CALCIUM 8.4*  AST 28  ALT 21  ALKPHOS 79  BILITOT 0.7   ------------------------------------------------------------------------------------------------------------------  Cardiac Enzymes No results for input(s): TROPONINI in the last 168 hours. ------------------------------------------------------------------------------------------------------------------  RADIOLOGY:  Dg Chest 2 View  Result Date: 12/12/2017 CLINICAL DATA:  Fall.  Left-sided rib fractures.  Fever and cough. EXAM: CHEST - 2 VIEW COMPARISON:  10/31/2017 FINDINGS: The heart size and mediastinal contours are within normal limits. There is diminished aeration to the left lower lobe suspicious for pneumonia. Left lateral rib fracture deformities are again noted. The visualized skeletal structures are unremarkable. IMPRESSION: 1. New left lower lobe opacities worrisome for pneumonia. 2. Left rib fractures as before. Electronically Signed   By: Kerby Moors M.D.   On: 12/12/2017 11:23   US Abdomen Complete  Result Date: 12/12/2017 CLINICAL DATA:  84 year old male with abdominal pain and LEFT-sided bruising following fall. History of cholecystectomy. EXAM: ABDOMEN ULTRASOUND COMPLETE COMPARISON:  None. FINDINGS: Portions of the abdomen not visualized secondary to bowel gas and patient's inability to move. Gallbladder: Not visualized compatible with cholecystectomy. Common bile duct: Diameter: 8 mm. No intrahepatic or extrahepatic biliary dilatation. Liver: No focal lesion identified. Within normal limits in parenchymal echogenicity. Portal vein is patent on color Doppler imaging with normal direction of blood flow towards the liver. IVC: Not visualized Pancreas: Not visualized Spleen:  Unremarkable. No definite focal splenic abnormality or adjacent fluid noted. Right Kidney: Length: 11.6 cm. Echogenicity within normal limits. No mass or hydronephrosis visualized. Left Kidney: Length: 11.9 cm. Echogenicity within normal limits. No mass or hydronephrosis visualized. Abdominal aorta: Not visualized Other findings: None. IMPRESSION: 1. No evidence of acute or significant abnormality. 2. IVC, pancreas and aorta not visualized. Electronically Signed   By: Margarette Canada M.D.   On: 12/12/2017 11:15    EKG:   Orders placed or performed during the hospital encounter of 12/12/17  . ED EKG  . ED EKG  . ED EKG  . ED EKG  . EKG 12-Lead  . EKG 12-Lead  . EKG 12-Lead  . EKG 12-Lead    IMPRESSION AND PLAN:   John Summers  is a 84 y.o. male with multiple medical problems including ITP, GERD, history of colon cancer, parkinsonism, prior falls, depression and anxiety brought in from home secondary to worsening cough and fevers.  1. HCAP-due to  being in a nursing home recently, will cover for H CAP -Left lower lobe pneumonia likely from recent rib fractures and hypoventilation. -Pain is much improved.  Blood and sputum cultures -Started on vancomycin and cefepime.  Check MRSA PCR -Not requiring supplemental oxygen at this time.  2.  Idiopathic thrombocytopenic purpura-low platelets, continue to monitor, no indication for transfusion at this time.  Also has pancytopenia.  Outpatient follow-up with hematology as prior scheduled  3.  Chronic low back pain-continue pain medications as needed.  4.  Dementia with parkinsonism-continue home medications.  Seems to be at baseline, pleasantly confused but interacts well.  5.  DVT prophylaxis-on Lovenox  Physical therapy consulted.  At baseline able to ambulate with walker or 1 person assist.    All the records are reviewed and case discussed with ED provider. Management plans discussed with the patient, family and they are in  agreement.  CODE STATUS: Full Code  TOTAL TIME TAKING CARE OF THIS PATIENT: 55 minutes.    Gladstone Lighter M.D on 12/12/2017 at 1:07 PM  Between 7am to 6pm - Pager - [phone]  After 6pm go to [email or web address] - password [facility]  Office  [phone]  CC: Primary care physician; Owens Loffler, MD

## 2017-12-12 NOTE — Progress Notes (Signed)
   [facility] at [facility] Day: 0 days John Summers is a 84 y.o. male presenting with Weakness .   Advance care planning discussed with patient  And his daughter at bedside. All questions in regards to overall condition and expected prognosis answered.  Patient does have a living will stating that he would want to be full code and attempt resuscitation- but does not want to be on ventilator long term.  the decision was made to continue current code status  CODE STATUS: Full Code Time spent: 18 minutes

## 2017-12-12 NOTE — ED Notes (Signed)
Patient's step-daughter at bedside. Patient has urinal available for urine specimen.

## 2017-12-12 NOTE — ED Triage Notes (Signed)
Pt to ED via [facility] from home for generalized weakness and fever for the past few days. Per EMS pt had rhonchi throughout all lungs fields. Pt had recent fall with broken ribs on the left side. Pt in NAD at this time.

## 2017-12-13 LAB — BASIC METABOLIC PANEL
ANION GAP: 6 (ref 5–15)
BUN: 21 mg/dL — ABNORMAL HIGH (ref 6–20)
CHLORIDE: 107 mmol/L (ref 101–111)
CO2: 24 mmol/L (ref 22–32)
Calcium: 7.9 mg/dL — ABNORMAL LOW (ref 8.9–10.3)
Creatinine, Ser: 0.79 mg/dL (ref 0.61–1.24)
Glucose, Bld: 83 mg/dL (ref 65–99)
POTASSIUM: 3.3 mmol/L — AB (ref 3.5–5.1)
Sodium: 137 mmol/L (ref 135–145)

## 2017-12-13 LAB — CBC
HCT: 26.6 % — ABNORMAL LOW (ref 40.0–52.0)
HEMOGLOBIN: 9 g/dL — AB (ref 13.0–18.0)
MCH: 36.9 pg — ABNORMAL HIGH (ref 26.0–34.0)
MCHC: 33.7 g/dL (ref 32.0–36.0)
MCV: 109.5 fL — AB (ref 80.0–100.0)
Platelets: 66 10*3/uL — ABNORMAL LOW (ref 150–440)
RBC: 2.43 MIL/uL — AB (ref 4.40–5.90)
RDW: 14.2 % (ref 11.5–14.5)
WBC: 2 10*3/uL — AB (ref 3.8–10.6)

## 2017-12-13 MED ORDER — VANCOMYCIN HCL IN DEXTROSE 1-5 GM/200ML-% IV SOLN
1000.0000 mg | Freq: Two times a day (BID) | INTRAVENOUS | Status: DC
  Filled 2017-12-13 (×2): qty 200

## 2017-12-13 MED ORDER — POTASSIUM CHLORIDE CRYS ER 20 MEQ PO TBCR
40.0000 meq | EXTENDED_RELEASE_TABLET | Freq: Once | ORAL | Status: AC
  Administered 2017-12-13: 40 meq via ORAL
  Filled 2017-12-13: qty 2

## 2017-12-13 NOTE — Progress Notes (Signed)
[facility] [place] NAME: John Summers    MR#:  [id number]  DATE OF BIRTH:  [birth year]  SUBJECTIVE:   Patient feels weak this am Left side hurts from rib fractures  Patient was coughing while eating while I was in the room  REVIEW OF SYSTEMS:    Review of Systems  Constitutional: Negative for fever, chills weight loss positive generalized fatigue HENT: Negative for ear pain, nosebleeds, congestion, facial swelling, rhinorrhea, neck pain, neck stiffness and ear discharge.   Respiratory: Positive for cough and shortness of breath   cardiovascular: Negative for chest pain, palpitations and leg swelling.  Gastrointestinal: Negative for heartburn, abdominal pain, vomiting, diarrhea or consitpation Genitourinary: Negative for dysuria, urgency, frequency, hematuria Musculoskeletal: Positive for left-sided rib pain Neurological: Negative for dizziness, seizures, syncope, focal weakness,  numbness and headaches.  Hematological: Does not bruise/bleed easily.  Psychiatric/Behavioral: Negative for hallucinations, confusion, dysphoric mood    Tolerating Diet: yes      DRUG ALLERGIES:   Allergies  Allergen Reactions  . Aspirin Other (See Comments)  . Chicken Allergy Nausea And Vomiting    Unknown   . Nsaids     Low platelets  . Other Nausea And Vomiting    [place] per daughter     VITALS:  Blood pressure 136/71, pulse 61, temperature 98.1 F (36.7 C), temperature source Oral, resp. rate 18, weight 75.3 kg (166 lb), SpO2 96 %.  PHYSICAL EXAMINATION:  Constitutional: Appears well-developed and well-nourished. No distress. HENT: Normocephalic. Marland Kitchen Oropharynx is clear and moist.  Eyes: Conjunctivae and EOM are normal. PERRLA, no scleral icterus.  Neck: Normal ROM. Neck supple. No JVD. No tracheal deviation. CVS: RRR, S1/S2 +, no murmurs, no gallops, no carotid bruit.  Pulmonary: Normal respiratory effort with decreased breath  sounds Abdominal: Soft. BS +,  no distension, tenderness, rebound or guarding.  Musculoskeletal: Normal range of motion. No edema and no tenderness.  Neuro: Alert. CN 2-12 grossly intact. No focal deficits. Skin: Skin is warm and dry. No rash noted. Psychiatric: Normal mood    LABORATORY PANEL:   CBC Recent Labs  Lab 12/13/17 0434  WBC 2.0*  HGB 9.0*  HCT 26.6*  PLT 66*   ------------------------------------------------------------------------------------------------------------------  Chemistries  Recent Labs  Lab 12/12/17 1029 12/13/17 0434  NA 141 137  K 4.0 3.3*  CL 108 107  CO2 26 24  GLUCOSE 124* 83  BUN 21* 21*  CREATININE 0.95 0.79  CALCIUM 8.4* 7.9*  AST 28  --   ALT 21  --   ALKPHOS 79  --   BILITOT 0.7  --    ------------------------------------------------------------------------------------------------------------------  Cardiac Enzymes Recent Labs  Lab 12/12/17 1440  TROPONINI 0.04*   ------------------------------------------------------------------------------------------------------------------  RADIOLOGY:  Dg Chest 2 View  Result Date: 12/12/2017 CLINICAL DATA:  Fall.  Left-sided rib fractures.  Fever and cough. EXAM: CHEST - 2 VIEW COMPARISON:  10/31/2017 FINDINGS: The heart size and mediastinal contours are within normal limits. There is diminished aeration to the left lower lobe suspicious for pneumonia. Left lateral rib fracture deformities are again noted. The visualized skeletal structures are unremarkable. IMPRESSION: 1. New left lower lobe opacities worrisome for pneumonia. 2. Left rib fractures as before. Electronically Signed   By: Kerby Moors M.D.   On: 12/12/2017 11:23   US Abdomen Complete  Result Date: 12/12/2017 CLINICAL DATA:  84 year old male with abdominal pain and LEFT-sided bruising following fall. History of cholecystectomy. EXAM: ABDOMEN ULTRASOUND COMPLETE COMPARISON:  None.  FINDINGS: Portions of the abdomen not  visualized secondary to bowel gas and patient's inability to move. Gallbladder: Not visualized compatible with cholecystectomy. Common bile duct: Diameter: 8 mm. No intrahepatic or extrahepatic biliary dilatation. Liver: No focal lesion identified. Within normal limits in parenchymal echogenicity. Portal vein is patent on color Doppler imaging with normal direction of blood flow towards the liver. IVC: Not visualized Pancreas: Not visualized Spleen: Unremarkable. No definite focal splenic abnormality or adjacent fluid noted. Right Kidney: Length: 11.6 cm. Echogenicity within normal limits. No mass or hydronephrosis visualized. Left Kidney: Length: 11.9 cm. Echogenicity within normal limits. No mass or hydronephrosis visualized. Abdominal aorta: Not visualized Other findings: None. IMPRESSION: 1. No evidence of acute or significant abnormality. 2. IVC, pancreas and aorta not visualized. Electronically Signed   By: Margarette Canada M.D.   On: 12/12/2017 11:15     ASSESSMENT AND PLAN:   84 year old male with ITP, colon cancer, Parkinson's and Lewy body dementia who presents with shortness of breath and cough.  1.HCAP, left lower lobe: Continue cefepime MRSA PCR was negative and therefore I have discontinued vancomycin. Patient was coughing while I was in the room.  I am concerned patient may be aspirating.  Speech consultation requested.   2.  Recent left-sided rib fractures after mechanical fall: Continue incentive spirometer  3.  ITP: Platelets are low but no need for transfusion at this time. Continue to monitor  4.  Leukopenia: This may be due from pneumonia Repeat CBC in a.m.  5.  Anemia of chronic disease: Continue to monitor  6.  Hypokalemia: Replete  7.  Lewy body dementia with behavioral disturbance: Continue Aricept  8.  Depression: Continue Prozac and Seroquel  PT consultation for discharge planning      Management plans discussed with the patient and he is in  agreement.  CODE STATUS: FULL  TOTAL TIME TAKING CARE OF THIS PATIENT: 30 minutes.     POSSIBLE D/C tomorrow, DEPENDING ON CLINICAL CONDITION.   Genevieve Arbaugh M.D on 12/13/2017 at 11:05 AM  Between 7am to 6pm - Pager - [phone] After 6pm go to [email or web address] - password [email or web address] [facility]  Office  [phone]  CC: Primary care physician; Owens Loffler, MD  Note: This dictation was prepared with Dragon dictation along with smaller phrase technology. Any transcriptional errors that result from this process are unintentional.

## 2017-12-13 NOTE — Care Management Note (Addendum)
Case Management Note  Patient Details  Name: John Summers MRN: [id number] Date of Birth: September 01, 1933  Subjective/Objective:   Admitted to [facility] with the diagnosis of pneumonia. Lives with daughter, [name] [phone]) x 1 week.  Sees Dr. Lorelei Pont at [facility] for physician care. [facility] 11/05/17. Prior to [facility] was at [facility].  24/7 caregivers  in the home per [facility]. Rolling walker in the home,  Frequent falls.  Sitter at the bedside. No family present at this time.                 Action/Plan: Physical therapy evaluation completed. Recommending home with home health/physical therapy. Will discuss these recommendations with the family.   Expected Discharge Date:                  Expected Discharge Plan:     In-House Referral:    yes Discharge planning Services   yes  Post Acute Care Choice:    Choice offered to:     DME Arranged:    DME Agency:     HH Arranged:    HH Agency:     Status of Service:     If discussed at [employer] of Stay Meetings, dates discussed:    Additional Comments:  Shelbie Ammons, RN MSN CCM Care Management [phone] 12/13/2017, 11:59 AM

## 2017-12-13 NOTE — Evaluation (Signed)
Physical Therapy Evaluation Patient Details Name: John Summers MRN: [id number] DOB: 07-15-1933 Today's Date: 12/13/2017   History of Present Illness  Pt is an 84 y.o. male presenting to hospital 12/12/17 with weakness, cough, fever; of note pt with recent fall with L rib fx's.  Pt admitted to hospital with L LL PNA (HCAP).  PMH includes fall 10/30/17 with L lateral multiple rib fx's and lumbar transverse process fx, Lewy body dementia, htn, L THA, htn, and Parkinsonism.  Clinical Impression  Prior to hospital admission, pt was ambulating around 20 feet with RW (pt has 24/7 assist with functional mobility for safety; pt's son also reports pt has h/o orthostatic hypotension).  Pt has lived with his daughter (who is a Marine scientist) for the last week in 1 level home with small 3 inch step to enter.  Currently pt is mod assist with transfers and CGA to min assist ambulating a few feet with RW.  Posterior lean noted in standing requiring assist to correct.  Increased time required to initiate movement with activities and pt keeping eyes closed majority of session too (d/t eyes "burning" if opened--nursing notified) which also complicated sessions activities.  Pt would benefit from skilled PT to address noted impairments and functional limitations (see below for any additional details).  Upon hospital discharge, recommend pt discharge to home with 24/7 assist (anticipate pt will need to be w/c level initially for safety) and HHPT.    Follow Up Recommendations Home health PT;Supervision/Assistance - 24 hour    Equipment Recommendations  Rolling walker with 5" wheels;Wheelchair (measurements PT);Wheelchair cushion (measurements PT)    Recommendations for Other Services       Precautions / Restrictions Precautions Precautions: Fall Precaution Comments: Orthostatic hypotension Restrictions Weight Bearing Restrictions: No      Mobility  Bed Mobility Overal bed mobility: Needs Assistance Bed Mobility:  Supine to Sit;Sit to Supine     Supine to sit: Mod assist;Max assist;HOB elevated Sit to supine: +2 for physical assistance   General bed mobility comments: assist for trunk and B LE's supine to sit; 2 assist to lay back down in bed end of session and get boosted up in bed  Transfers Overall transfer level: Needs assistance Equipment used: Rolling walker (2 wheeled);None Transfers: Sit to/from [facility] Sit to Stand: Mod assist Stand pivot transfers: Mod assist       General transfer comment: x3 trials sit to/from stand from bed with RW; posterior lean noted; vc's required to scoot forward and "nose over toes" technique; assist for LE placement; stand pivot recliner to bed with vc's for technique  Ambulation/Gait Ambulation/Gait assistance: Min guard;Min assist Ambulation Distance (Feet): 3 Feet(bed to recliner) Assistive device: Rolling walker (2 wheeled)   Gait velocity: decreased   General Gait Details: decreased B step length/foot clearance/heelstrike; pt with eyes closed (d/t reporting eyes "burning" if opened) and pt requiring considerable extra time to take steps bed to recliner d/t keeping eyes closed and also d/t increased time to initiate movement in general)  Stairs            Wheelchair Mobility    Modified Rankin (Stroke Patients Only)       Balance Overall balance assessment: Needs assistance;History of Falls Sitting-balance support: No upper extremity supported;Feet supported Sitting balance-Leahy Scale: Good Sitting balance - Comments: steady sitting reaching within BOS   Standing balance support: Bilateral upper extremity supported(on RW) Standing balance-Leahy Scale: Poor Standing balance comment: posterior lean in standing initially requiring assist to correct (  with B UE support on RW)                             Pertinent Vitals/Pain Pain Assessment: Faces Faces Pain Scale: Hurts even more Pain Location: L  ribs Pain Descriptors / Indicators: Grimacing;Guarding Pain Intervention(s): Limited activity within patient's tolerance;Monitored during session;Repositioned;Other (comment)(RN present end of session and aware)  HR and O2 on room air WFL during session. Pt's BP noted to be 70/53 and then 90/56 sitting in chair end of session (pt assisted back to bed for safety).    Home Living Family/patient expects to be discharged to:: Private residence Living Arrangements: Children(Pt's daughter who is a Marine scientist) Available Help at Discharge: Family;Personal care attendant;Available 24 hours/day Type of Home: House Home Access: Stairs to enter   [employer] of Steps: 3 inch step to enter Home Layout: One level Home Equipment: Walker - 2 wheels;Tub bench(grab bars placed in bathroom area (but not in tub/shower or next to toilet))      Prior Function Level of Independence: Needs assistance   Gait / Transfers Assistance Needed: Ambulates around 20 feet with walker with assist.  Always has assist with functional mobility (but pt has been known to get up on his own if not monitored).  ADL's / Homemaking Assistance Needed: Assist for ADL's.  Comments: Has PCA during the day; pt's daughter or son assists at night; 24/7 assist     Hand Dominance        Extremity/Trunk Assessment   Upper Extremity Assessment Upper Extremity Assessment: Generalized weakness(increased time to initiate movement)    Lower Extremity Assessment Lower Extremity Assessment: Generalized weakness(increased time to initiate movement)    Cervical / Trunk Assessment Cervical / Trunk Assessment: Kyphotic  Communication   Communication: No difficulties  Cognition Arousal/Alertness: Awake/alert Behavior During Therapy: Flat affect Overall Cognitive Status: History of cognitive impairments - at baseline                                 General Comments: Increased time to respond to one step  commands.      General Comments General comments (skin integrity, edema, etc.): Pt resting in bed upon PT entry; pt's son present beginning of session; private PCA present during session.  Nursing cleared pt for participation in physical therapy.  Pt agreeable to PT session.    Exercises  Transfer training   Assessment/Plan    PT Assessment Patient needs continued PT services  PT Problem List Decreased strength;Decreased activity tolerance;Decreased balance;Decreased mobility;Decreased knowledge of use of DME;Pain;Decreased knowledge of precautions       PT Treatment Interventions DME instruction;Gait training;Stair training;Functional mobility training;Therapeutic activities;Therapeutic exercise;Balance training;Patient/family education    PT Goals (Current goals can be found in the Care Plan section)  Acute Rehab PT Goals Patient Stated Goal: to go home PT Goal Formulation: With patient Time For Goal Achievement: 12/27/17 Potential to Achieve Goals: Fair    Frequency Min 2X/week   Barriers to discharge        Co-evaluation               AM-PAC PT "6 Clicks" Daily Activity  Outcome Measure Difficulty turning over in bed (including adjusting bedclothes, sheets and blankets)?: Unable Difficulty moving from lying on back to sitting on the side of the bed? : Unable Difficulty sitting down on and standing up from a chair with  arms (e.g., wheelchair, bedside commode, etc,.)?: Unable Help needed moving to and from a bed to chair (including a wheelchair)?: A Lot Help needed walking in hospital room?: A Lot Help needed climbing 3-5 steps with a railing? : Total 6 Click Score: 8    End of Session Equipment Utilized During Treatment: Gait belt Activity Tolerance: Patient tolerated treatment well Patient left: in bed;with call bell/phone within reach;with bed alarm set;with nursing/sitter in room;with family/visitor present Nurse Communication: Mobility  status;Precautions PT Visit Diagnosis: Other abnormalities of gait and mobility (R26.89);Muscle weakness (generalized) (M62.81);History of falling (Z91.81);Difficulty in walking, not elsewhere classified (R26.2);Other symptoms and signs involving the nervous system (R29.898)    Time: 6387-5643 PT Time Calculation (min) (ACUTE ONLY): 42 min   Charges:   PT Evaluation $PT Eval Low Complexity: 1 Low PT Treatments $Therapeutic Activity: 23-37 mins   PT G CodesLeitha Bleak, PT 12/13/17, 11:38 AM [phone]

## 2017-12-14 LAB — BASIC METABOLIC PANEL
ANION GAP: 5 (ref 5–15)
BUN: 18 mg/dL (ref 6–20)
CO2: 26 mmol/L (ref 22–32)
Calcium: 8.3 mg/dL — ABNORMAL LOW (ref 8.9–10.3)
Chloride: 111 mmol/L (ref 101–111)
Creatinine, Ser: 0.79 mg/dL (ref 0.61–1.24)
Glucose, Bld: 91 mg/dL (ref 65–99)
Potassium: 3.6 mmol/L (ref 3.5–5.1)
Sodium: 142 mmol/L (ref 135–145)

## 2017-12-14 LAB — CBC
HCT: 30.5 % — ABNORMAL LOW (ref 40.0–52.0)
Hemoglobin: 10.2 g/dL — ABNORMAL LOW (ref 13.0–18.0)
MCH: 36.3 pg — ABNORMAL HIGH (ref 26.0–34.0)
MCHC: 33.4 g/dL (ref 32.0–36.0)
MCV: 108.5 fL — ABNORMAL HIGH (ref 80.0–100.0)
PLATELETS: 86 10*3/uL — AB (ref 150–440)
RBC: 2.81 MIL/uL — ABNORMAL LOW (ref 4.40–5.90)
RDW: 14.1 % (ref 11.5–14.5)
WBC: 2 10*3/uL — AB (ref 3.8–10.6)

## 2017-12-14 MED ORDER — HYDRALAZINE HCL 20 MG/ML IJ SOLN
10.0000 mg | Freq: Four times a day (QID) | INTRAMUSCULAR | Status: DC | PRN
  Administered 2017-12-14: 06:00:00 10 mg via INTRAVENOUS
  Filled 2017-12-14: qty 1

## 2017-12-14 MED ORDER — AMOXICILLIN-POT CLAVULANATE 875-125 MG PO TABS: 1.0000 | ORAL_TABLET | Freq: Two times a day (BID) | ORAL | 0 refills | Status: DC

## 2017-12-14 MED ORDER — FLUOXETINE HCL 20 MG PO TABS: 40.0000 mg | ORAL_TABLET | Freq: Every day | ORAL | Status: DC

## 2017-12-14 NOTE — Discharge Instructions (Signed)

## 2017-12-14 NOTE — Care Management (Signed)
Dr, Benjie Karvonen spoke with daughter. States that they would like Home Health services per [facility]. Telephone call to [facility] representative Vickie. Requested a call back to confirm this message from [place]. Son would like his father transported home per [facility].  Discharge to home today per Dr. Mirna Mires RN MSN CCM Care Management [phone]

## 2017-12-14 NOTE — Evaluation (Signed)
Clinical/Bedside Swallow Evaluation Patient Details  Name: John Summers MRN: [id number] Date of Birth: 11-18-1932  Today's Date: 12/14/2017 Time: SLP Start Time (ACUTE ONLY): 0902 SLP Stop Time (ACUTE ONLY): 1002 SLP Time Calculation (min) (ACUTE ONLY): 60 min  Past Medical History:  Past Medical History:  Diagnosis Date  . Colon adenocarcinoma ([employer])   . Complication of anesthesia    severe confusion  . GERD (gastroesophageal reflux disease)   . History of colon polyps   . History of gallstones   . Hx of transfusion of platelets   . Hypertension   . ITP (idiopathic thrombocytopenic purpura)    Dr. Grayland Ormond follows  . Kidney stones   . Lewy body dementia with behavioral disturbance   . Major depressive disorder, recurrent episode, in partial remission with anxious distress ([place])   . Pancreatitis 2/11  . Pollen allergies   . Ribs, multiple fractures   . Skin cancer    ears  . Sleep apnea    "won't wear mask anymore" (12/22/2012)  . Unspecified essential hypertension    Past Surgical History:  Past Surgical History:  Procedure Laterality Date  . CARDIAC CATHETERIZATION  2000's   "tx'd w/acid reflux RX" (12/22/2012)  . CHOLECYSTECTOMY  2011  . ESOPHAGEAL DILATION  1990's?  Marland Kitchen HERNIA REPAIR  12/22/2012   IHR w/mesh  . INGUINAL HERNIA REPAIR Left [id number]  . INSERTION OF MESH N/A 12/22/2012   Procedure: INSERTION OF MESH;  Surgeon: Harl Bowie, MD;  Location: [place];  Service: General;  Laterality: N/A;  . LITHOTRIPSY  2008   x2  . PARTIAL COLECTOMY  03/21/2012   Procedure: PARTIAL COLECTOMY;  Surgeon: Harl Bowie, MD;  Location: [place];  Service: General;  Laterality: Right;  . PILONIDAL CYST EXCISION  1960's?  Marland Kitchen SKIN CANCER EXCISION     "face; ?left ear" (12/22/2012)  . TONSILLECTOMY AND ADENOIDECTOMY  1946  . TOTAL HIP ARTHROPLASTY Left ~ 2002  . UPPER GASTROINTESTINAL ENDOSCOPY    . VENTRAL HERNIA REPAIR N/A 12/22/2012   Procedure: HERNIA REPAIR  INCISIONAL ;  Surgeon: Harl Bowie, MD;  Location: [facility];  Service: General;  Laterality: N/A;   HPI:  Pt is a 84 y.o. male with multiple medical problems including Lewy Body Dementia (extra processing time needed during information/instruction), ITP, GERD, history of colon cancer, parkinsonism, prior falls, depression and anxiety brought in from home secondary to worsening cough and a fever. Patient has garbled speech and very slow to respond.  Most of the history is obtained from his daughter at bedside.  Patient was at an assisted living facility was having multiple falls, was recently at a nursing home for short-term rehab and had 3 falls over there.  Also had LEFT-sided rib fractures recently because of the falls.  He was recently taken home a week ago and was relatively doing well though he had thick congested cough, min SOB.  Brought to the ER.  Chest x-ray here shows Left lower lobe pneumonia.  Pt has private Caregivers 24/7 who assist him.    Assessment / Plan / Recommendation Clinical Impression  Pt appeared to present w/ adequate oropharyngeal phase swallow functioning w/ reduced risk for aspiration when following general aspiration precautions. Pt consumed po's during his breakfast meal to include mech soft foods, purees and thin liquids (multiple ozs via Straw) w/ no overt s/s of aspiration noted; no decline in respiratory status or vocal quality post trials. Pt consumed all liquid trials via Straw as  at baseline per Caregiver. Oral phase appeared grossly wfl w/ trials that were in small bolus amounts, moistened/soft. Caregiver reported someone cuts his meats/foods at home - suggested small, cut pieces moistened well for easier mastication. Pt does have 24/7 private Caregivers at home to monitor pt during meals and assist as needed. Recommended maintaining current diet as ordered w/ general aspiration precautions; Pills can be swallowed in a Puree for safer, easier swallowing if needed d/t  Cognitive status(baseline). NSG to educate family/Caregiver on this. No further skilled ST services indicated at this time as pt appears at his baseline re: swallowing and strategy/option was given for swallowing Pills more comfortably and safely if needed. Pt may have been distracted during his meal yesterday and talking while eating/drinking - recommend reducing ALL distractions during meals/oral intake - education given to pt but unsure of his level of comprehension d/t Dementia. Pt agreed. Recommend diet as below. NSG updated.  SLP Visit Diagnosis: Dysphagia, unspecified (R13.10)(may have been distracted yesterday while eating)    Aspiration Risk  (reduced following general aspiration precautions)    Diet Recommendation  Regular/Mech Soft foods; Thin liquids (meats cut well, moistened).  General aspiration precautions and reducing distractions during meals, monitoring as needed d/t baseline Dementia.  Medication Administration: Whole meds with puree(if needed for easier, safer swallowing)    Other  Recommendations Recommended Consults: (Dietician f/u; private caregiver) Oral Care Recommendations: Oral care BID;Patient independent with oral care;Staff/trained caregiver to provide oral care Other Recommendations: (n/a)   Follow up Recommendations None      Frequency and Duration (n/a)  (n/a)       Prognosis Prognosis for Safe Diet Advancement: Good Barriers to Reach Goals: Cognitive deficits      Swallow Study   General Date of Onset: 12/12/17 HPI: Pt is a 84 y.o. male with multiple medical problems including Lewy Body Dementia (extra processing time needed during information/instruction), ITP, GERD, history of colon cancer, parkinsonism, prior falls, depression and anxiety brought in from home secondary to worsening cough and a fever. Patient has garbled speech and very slow to respond.  Most of the history is obtained from his daughter at bedside.  Patient was at an assisted living  facility was having multiple falls, was recently at a nursing home for short-term rehab and had 3 falls over there.  Also had LEFT-sided rib fractures recently because of the falls.  He was recently taken home a week ago and was relatively doing well though he had thick congested cough, min SOB.  Brought to the ER.  Chest x-ray here shows Left lower lobe pneumonia.  Pt has private Caregivers 24/7 who assist him.  Type of Study: Bedside Swallow Evaluation Previous Swallow Assessment: none reported Diet Prior to this Study: Regular;Thin liquids Temperature Spikes Noted: No(wbc 2.0 (low)) Respiratory Status: Room air History of Recent Intubation: No Behavior/Cognition: Alert;Cooperative;Pleasant mood;Confused;Distractible;Requires cueing(slow to process - baseline per report) Oral Cavity Assessment: Within Functional Limits Oral Care Completed by SLP: Recent completion by staff Oral Cavity - Dentition: Adequate natural dentition Vision: Functional for self-feeding Self-Feeding Abilities: Able to feed self;Needs set up(given at home) Patient Positioning: Upright in bed(needed assistance) Baseline Vocal Quality: Normal Volitional Cough: Strong Volitional Swallow: Able to elicit    Oral/Motor/Sensory Function Overall Oral Motor/Sensory Function: Within functional limits   Ice Chips Ice chips: Not tested   Thin Liquid Thin Liquid: Within functional limits Presentation: Cup;Self Fed;Straw(~10+ ozs)    Nectar Thick Nectar Thick Liquid: Not tested   Honey Thick Honey  Thick Liquid: Not tested   Puree Puree: Within functional limits Presentation: Self Fed;Spoon(4 ozs)   Solid   GO   Solid: Within functional limits(mech soft foods at meal) Presentation: Self Fed;Spoon(4+ ozs)         Orinda Kenner, MS, CCC-SLP Watson,Katherine 12/14/2017,10:19 AM

## 2017-12-14 NOTE — Plan of Care (Signed)
  Problem: Activity: Goal: Ability to tolerate increased activity will improve Outcome: Progressing   Problem: Clinical Measurements: Goal: Ability to maintain a body temperature in the normal range will improve Outcome: Progressing   Problem: Respiratory: Goal: Ability to maintain adequate ventilation will improve Outcome: Progressing Goal: Ability to maintain a clear airway will improve Outcome: Progressing   Problem: Education: Goal: Knowledge of General Education information will improve Outcome: Progressing   Problem: Health Behavior/Discharge Planning: Goal: Ability to manage health-related needs will improve Outcome: Progressing   Problem: Clinical Measurements: Goal: Ability to maintain clinical measurements within normal limits will improve Outcome: Progressing Goal: Diagnostic test results will improve Outcome: Progressing Goal: Respiratory complications will improve Outcome: Progressing   Problem: Nutrition: Goal: Adequate nutrition will be maintained Outcome: Progressing   Problem: Pain Managment: Goal: General experience of comfort will improve Outcome: Progressing   Problem: Safety: Goal: Ability to remain free from injury will improve Outcome: Progressing   Problem: Skin Integrity: Goal: Risk for impaired skin integrity will decrease Outcome: Progressing

## 2017-12-14 NOTE — Discharge Summary (Signed)
[facility] NAME: John Summers    MR#:  [id number]  DATE OF BIRTH:  01/01/1933  DATE OF ADMISSION:  12/12/2017 ADMITTING PHYSICIAN: Gladstone Lighter, MD  DATE OF DISCHARGE: 12/14/2017  PRIMARY CARE PHYSICIAN: Owens Loffler, MD    ADMISSION DIAGNOSIS:  Weakness [R53.1] Community acquired pneumonia of left lower lobe of lung ([place]) [J18.1]  DISCHARGE DIAGNOSIS:  Active Problems:   Pneumonia   SECONDARY DIAGNOSIS:   Past Medical History:  Diagnosis Date  . Colon adenocarcinoma ([place])   . Complication of anesthesia    severe confusion  . GERD (gastroesophageal reflux disease)   . History of colon polyps   . History of gallstones   . Hx of transfusion of platelets   . Hypertension   . ITP (idiopathic thrombocytopenic purpura)    Dr. Grayland Ormond follows  . Kidney stones   . Lewy body dementia with behavioral disturbance   . Major depressive disorder, recurrent episode, in partial remission with anxious distress ([place])   . Pancreatitis 2/11  . Pollen allergies   . Ribs, multiple fractures   . Skin cancer    ears  . Sleep apnea    "won't wear mask anymore" (12/22/2012)  . Unspecified essential hypertension     HOSPITAL COURSE:   84 year old male with ITP, colon cancer, Parkinson's and Lewy body dementia who presents with shortness of breath and cough.  1.HCAP, left lower lobe: He was started on cefepime and vancomycin on admission. MRSA PCR was negative and therefore I discontinued vancomycin. I had speech evaluation to make sure he does not aspirate.  She does not feel patient is aspirating.  He will be discharged on oral Augmentin.  2.  Recent left-sided rib fractures after mechanical fall: Continue incentive spirometer  3.  ITP: No acute issues  4.  Leukopenia: White blood cell count remains stable at 2.0.  Patient would benefit from outpatient follow-up.  5.  Anemia of chronic disease: Hemoglobin  remained relatively stable.  6.  Hypokalemia: This was repleted7.  Lewy body dementia with behavioral disturbance: Continue Aricept  7 Depression: Continue Prozac and Seroquel  8.  History of orthostatic hypotension: Patient has had outpatient evaluation regarding this.  He is already wearing lower extremity compression hose. Continue with general orthostatic hypotension instructions.  Medical therapy had recommended home with home health.    DISCHARGE CONDITIONS AND DIET:   Stable for discharge  Regular/Mech Soft foods; Thin liquids (meats cut well, moistened).  General aspiration precautions and reducing distractions during meals, monitoring as needed d/t baseline Dementia.  Medication Administration: Whole meds with puree(if needed for easier, safer swallowing)    CONSULTS OBTAINED:  Treatment Team:  Hollice Espy, MD  DRUG ALLERGIES:   Allergies  Allergen Reactions  . Aspirin Other (See Comments)  . Chicken Allergy Nausea And Vomiting    Unknown   . Nsaids     Low platelets  . Other Nausea And Vomiting    [place] per daughter     DISCHARGE MEDICATIONS:   Allergies as of 12/14/2017      Reactions   Aspirin Other (See Comments)   Chicken Allergy Nausea And Vomiting   Unknown   Nsaids    Low platelets   Other Nausea And Vomiting   [place] per daughter       Medication List    STOP taking these medications   ALPRAZolam 0.5 MG tablet Commonly known as:  XANAX   multivitamin with minerals  Tabs tablet     TAKE these medications   acetaminophen 325 MG tablet Commonly known as:  TYLENOL Take 2 tablets (650 mg total) by mouth every 6 (six) hours as needed for mild pain. What changed:    how much to take  when to take this   amoxicillin-clavulanate 875-125 MG tablet Commonly known as:  AUGMENTIN Take 1 tablet by mouth 2 (two) times daily.   donepezil 10 MG tablet Commonly known as:  ARICEPT take 1 tablet by mouth once daily   FLUoxetine 20 MG  tablet Commonly known as:  PROZAC Take 2 tablets (40 mg total) by mouth daily. 2 tab of 20 mg once a day. What changed:  additional instructions   hydrOXYzine 10 MG tablet Commonly known as:  ATARAX/VISTARIL take 1 tablet by mouth at bedtime   Melatonin 5 MG Tabs Take 1 tablet (5 mg total) by mouth at bedtime.   QUEtiapine 25 MG tablet Commonly known as:  SEROQUEL Take 1 tablet (25 mg total) by mouth at bedtime.   traMADol 50 MG tablet Commonly known as:  ULTRAM Take 1 tablet (50 mg total) by mouth every 6 (six) hours as needed for moderate pain.         Today   CHIEF COMPLAINT:  Patient with dementia   VITAL SIGNS:  Blood pressure (!) 162/95, pulse 69, temperature 97.9 F (36.6 C), temperature source Oral, resp. rate 20, height 6' (1.829 m), weight 75.3 kg (166 lb), SpO2 98 %.   REVIEW OF SYSTEMS:  Review of Systems  Unable to perform ROS: Dementia     PHYSICAL EXAMINATION:  GENERAL:  84 y.o.-year-old patient lying in the bed with no acute distress.  NECK:  Supple, no jugular venous distention. No thyroid enlargement, no tenderness.  LUNGS: Normal breath sounds bilaterally, no wheezing, rales,rhonchi  No use of accessory muscles of respiration.  CARDIOVASCULAR: S1, S2 normal. No murmurs, rubs, or gallops.  ABDOMEN: Soft, non-tender, non-distended. Bowel sounds present. No organomegaly or mass.  EXTREMITIES: No pedal edema, cyanosis, or clubbing.  PSYCHIATRIC: The patient is alert and oriented x name only SKIN: No obvious rash, lesion, or ulcer.   DATA REVIEW:   CBC Recent Labs  Lab 12/14/17 0639  WBC 2.0*  HGB 10.2*  HCT 30.5*  PLT 86*    Chemistries  Recent Labs  Lab 12/12/17 1029  12/14/17 0639  NA 141   < > 142  K 4.0   < > 3.6  CL 108   < > 111  CO2 26   < > 26  GLUCOSE 124*   < > 91  BUN 21*   < > 18  CREATININE 0.95   < > 0.79  CALCIUM 8.4*   < > 8.3*  AST 28  --   --   ALT 21  --   --   ALKPHOS 79  --   --   BILITOT 0.7  --   --     < > = values in this interval not displayed.    Cardiac Enzymes Recent Labs  Lab 12/12/17 1440  TROPONINI 0.04*    Microbiology Results  @[id number] @  RADIOLOGY:  Dg Chest 2 View  Result Date: 12/12/2017 CLINICAL DATA:  Fall.  Left-sided rib fractures.  Fever and cough. EXAM: CHEST - 2 VIEW COMPARISON:  10/31/2017 FINDINGS: The heart size and mediastinal contours are within normal limits. There is diminished aeration to the left lower lobe suspicious for pneumonia. Left lateral rib fracture  deformities are again noted. The visualized skeletal structures are unremarkable. IMPRESSION: 1. New left lower lobe opacities worrisome for pneumonia. 2. Left rib fractures as before. Electronically Signed   By: Kerby Moors M.D.   On: 12/12/2017 11:23   US Abdomen Complete  Result Date: 12/12/2017 CLINICAL DATA:  84 year old male with abdominal pain and LEFT-sided bruising following fall. History of cholecystectomy. EXAM: ABDOMEN ULTRASOUND COMPLETE COMPARISON:  None. FINDINGS: Portions of the abdomen not visualized secondary to bowel gas and patient's inability to move. Gallbladder: Not visualized compatible with cholecystectomy. Common bile duct: Diameter: 8 mm. No intrahepatic or extrahepatic biliary dilatation. Liver: No focal lesion identified. Within normal limits in parenchymal echogenicity. Portal vein is patent on color Doppler imaging with normal direction of blood flow towards the liver. IVC: Not visualized Pancreas: Not visualized Spleen: Unremarkable. No definite focal splenic abnormality or adjacent fluid noted. Right Kidney: Length: 11.6 cm. Echogenicity within normal limits. No mass or hydronephrosis visualized. Left Kidney: Length: 11.9 cm. Echogenicity within normal limits. No mass or hydronephrosis visualized. Abdominal aorta: Not visualized Other findings: None. IMPRESSION: 1. No evidence of acute or significant abnormality. 2. IVC, pancreas and aorta not visualized. Electronically  Signed   By: Margarette Canada M.D.   On: 12/12/2017 11:15      Allergies as of 12/14/2017      Reactions   Aspirin Other (See Comments)   Chicken Allergy Nausea And Vomiting   Unknown   Nsaids    Low platelets   Other Nausea And Vomiting   [place] per daughter       Medication List    STOP taking these medications   ALPRAZolam 0.5 MG tablet Commonly known as:  XANAX   multivitamin with minerals Tabs tablet     TAKE these medications   acetaminophen 325 MG tablet Commonly known as:  TYLENOL Take 2 tablets (650 mg total) by mouth every 6 (six) hours as needed for mild pain. What changed:    how much to take  when to take this   amoxicillin-clavulanate 875-125 MG tablet Commonly known as:  AUGMENTIN Take 1 tablet by mouth 2 (two) times daily.   donepezil 10 MG tablet Commonly known as:  ARICEPT take 1 tablet by mouth once daily   FLUoxetine 20 MG tablet Commonly known as:  PROZAC Take 2 tablets (40 mg total) by mouth daily. 2 tab of 20 mg once a day. What changed:  additional instructions   hydrOXYzine 10 MG tablet Commonly known as:  ATARAX/VISTARIL take 1 tablet by mouth at bedtime   Melatonin 5 MG Tabs Take 1 tablet (5 mg total) by mouth at bedtime.   QUEtiapine 25 MG tablet Commonly known as:  SEROQUEL Take 1 tablet (25 mg total) by mouth at bedtime.   traMADol 50 MG tablet Commonly known as:  ULTRAM Take 1 tablet (50 mg total) by mouth every 6 (six) hours as needed for moderate pain.           Management plans discussed with the patient's family and she  is in agreement. Stable for discharge home with [facility]  Patient should follow up with pcp  CODE STATUS:     Code Status Orders  (From admission, onward)        Start     Ordered   12/12/17 1430  Full code  Continuous     12/12/17 1429    Code Status History    Date Active Date Inactive Code Status Order ID Comments User  Context   10/30/2017 1514 11/05/2017 1444 Full Code [id number]   Rolm Bookbinder, MD ED   02/01/2017 0031 02/02/2017 1823 Full Code [id number]  Lance Coon, MD Inpatient   01/07/2017 0040 01/07/2017 2017 Full Code [id number]  Hugelmeyer, [place], DO Inpatient   04/24/2016 2152 04/25/2016 1935 Full Code [id number]  Lance Coon, MD Inpatient   12/22/2012 1233 12/23/2012 1359 Full Code [id number]  Harl Bowie, MD Inpatient   03/21/2012 1457 03/28/2012 2018 Full Code [id number]  Suezanne Cheshire, RN Inpatient    Advance Directive Documentation     Most Recent Value  Type of Advance Directive  Healthcare Power of Attorney, Living will  Pre-existing out of facility DNR order (yellow form or pink MOST form)  -  "MOST" Form in Place?  -      TOTAL TIME TAKING CARE OF THIS PATIENT: 38 minutes.    Note: This dictation was prepared with Dragon dictation along with smaller phrase technology. Any transcriptional errors that result from this process are unintentional.  Georgiann Neider M.D on 12/14/2017 at 10:44 AM  Between 7am to 6pm - Pager - [phone] After 6pm go to [email or web address] - password [facility]  Office  [phone]  CC: Primary care physician; Owens Loffler, MD

## 2017-12-14 NOTE — Progress Notes (Signed)
Physical Therapy Treatment Patient Details Name: John Summers MRN: [id number] DOB: Jul 11, 1933 Today's Date: 12/14/2017    History of Present Illness Pt is an 84 y.o. male presenting to hospital 12/12/17 with weakness, cough, fever; of note pt with recent fall with L rib fx's.  Pt admitted to hospital with L LL PNA (HCAP).  PMH includes fall 10/30/17 with L lateral multiple rib fx's and lumbar transverse process fx, Lewy body dementia, htn, L THA, htn, and Parkinsonism.    PT Comments    Pt demonstrating improved ability to stand today (CGA with RW) and able to walk a couple feet with RW CGA; pt then appearing orthostatic in standing (pt started falling backwards "straight as a board" requiring assist of therapist to safely sit pt in recliner; therapist then quickly reclined pt in recliner with LE's elevated; pt with brief moment of not talking with therapist but then pt quickly talking again and able to joke around; nursing, nursing tech, and MD notified of this event and also pt's vitals).  BP supine beginning of session 113/71; sitting 95/63; and reclined in recliner (after above noted event) 101/62.  Gradually sat pt up in chair and end of session pt's BP 101/66 sitting upright in chair.  Pt's caregiver present during session and therapist educated her on safety with transfers/functional mobility d/t concerns of orthostatic hypotension and risks for passing out/falling d/t this; therapist also recommended limiting time standing initially d/t these concerns (transfer to w/c in a timely manner and pt w/c level initially at home for safety): pt's caregiver reported she would notify pt's daughter regarding above and that she did not have any further questions regarding this (pt has had h/o orthostatic hypotension and has had prior experiences with it already).   Follow Up Recommendations  Home health PT;Supervision/Assistance - 24 hour     Equipment Recommendations  Rolling walker with 5"  wheels;Wheelchair (measurements PT);Wheelchair cushion (measurements PT)    Recommendations for Other Services       Precautions / Restrictions Precautions Precautions: Fall Precaution Comments: Orthostatic hypotension Restrictions Weight Bearing Restrictions: No    Mobility  Bed Mobility Overal bed mobility: Needs Assistance Bed Mobility: Supine to Sit     Supine to sit: Mod assist;HOB elevated     General bed mobility comments: pt able to move B LE's towards edge of bed with increased time; pt able to partially sit up in bed but required mod assist for trunk and B LE's to scoot to edge of bed (caregiver present reports that is pt's normal assist requirements)  Transfers Overall transfer level: Needs assistance Equipment used: Rolling walker (2 wheeled) Transfers: Sit to/from Stand Sit to Stand: Min guard         General transfer comment: able to stand with RW CGA; no vc's given  Ambulation/Gait Ambulation/Gait assistance: Min guard Ambulation Distance (Feet): 2 Feet Assistive device: Rolling walker (2 wheeled)   Gait velocity: decreased   General Gait Details: decreased B step length/foot clearance/heelstrike; steady with RW initially (CGA) until pt appearing orthostatic requiring max assist to sit in chair safely   Stairs            Wheelchair Mobility    Modified Rankin (Stroke Patients Only)       Balance Overall balance assessment: Needs assistance;History of Falls Sitting-balance support: No upper extremity supported;Feet supported Sitting balance-Leahy Scale: Poor Sitting balance - Comments: B UE support for static sitting balance (posterior lean noted requiring occasional vc's to lean forward)  Standing balance support: Bilateral upper extremity supported(on RW) Standing balance-Leahy Scale: Poor Standing balance comment: requires B UE support on RW for balance with functional mobility                            Cognition  Arousal/Alertness: Awake/alert Behavior During Therapy: Flat affect Overall Cognitive Status: History of cognitive impairments - at baseline                                 General Comments: Increased time to respond to one step commands.      Exercises      General Comments General comments (skin integrity, edema, etc.): Pt resting in bed upon PT entry; pt's caregiver Loree Fee) present throughout session.  Pt's caregiver reports pt has a manual w/c and rollator at home.      Pertinent Vitals/Pain Pain Assessment: Faces Faces Pain Scale: Hurts little more(occasionally during therapy session) Pain Location: L ribs Pain Descriptors / Indicators: Grimacing;Guarding Pain Intervention(s): Limited activity within patient's tolerance;Monitored during session;Repositioned    Home Living                      Prior Function            PT Goals (current goals can now be found in the care plan section) Acute Rehab PT Goals Patient Stated Goal: to go home PT Goal Formulation: With patient Time For Goal Achievement: 12/27/17 Potential to Achieve Goals: Fair Progress towards PT goals: Progressing toward goals    Frequency    Min 2X/week      PT Plan Current plan remains appropriate    Co-evaluation              AM-PAC PT "6 Clicks" Daily Activity  Outcome Measure  Difficulty turning over in bed (including adjusting bedclothes, sheets and blankets)?: Unable Difficulty moving from lying on back to sitting on the side of the bed? : Unable Difficulty sitting down on and standing up from a chair with arms (e.g., wheelchair, bedside commode, etc,.)?: Unable Help needed moving to and from a bed to chair (including a wheelchair)?: A Little Help needed walking in hospital room?: A Lot Help needed climbing 3-5 steps with a railing? : Total 6 Click Score: 9    End of Session Equipment Utilized During Treatment: Gait belt Activity Tolerance: Other  (comment)(Pt appearing orthostatic in standing falling straight backwards requiring assist for safe sitting in chair) Patient left: in chair;with call bell/phone within reach;with chair alarm set(Private sitter in room) Nurse Communication: Mobility status;Precautions;Other (comment)(Pt appearing orthostatic in standing) PT Visit Diagnosis: Other abnormalities of gait and mobility (R26.89);Muscle weakness (generalized) (M62.81);History of falling (Z91.81);Difficulty in walking, not elsewhere classified (R26.2);Other symptoms and signs involving the nervous system (R29.898)     Time: 7989-2119 PT Time Calculation (min) (ACUTE ONLY): 38 min  Charges:  $Therapeutic Exercise: 8-22 mins $Therapeutic Activity: 23-37 mins                    G CodesLeitha Bleak, PT 12/14/17, 11:20 AM [phone]

## 2017-12-14 NOTE — Plan of Care (Signed)
  Problem: Activity: Goal: Ability to tolerate increased activity will improve Outcome: Progressing   Problem: Clinical Measurements: Goal: Ability to maintain a body temperature in the normal range will improve Outcome: Progressing   Problem: Respiratory: Goal: Ability to maintain adequate ventilation will improve Outcome: Progressing Goal: Ability to maintain a clear airway will improve Outcome: Progressing Problem: Education: Goal: Knowledge of General Education information will improve Outcome: Progressing   Problem: Health Behavior/Discharge Planning: Goal: Ability to manage health-related needs will improve Outcome: Progressing   Problem: Clinical Measurements: Goal: Ability to maintain clinical measurements within normal limits will improve Outcome: Progressing Goal: Diagnostic test results will improve Outcome: Progressing Goal: Respiratory complications will improve Outcome: Progressing   Problem: Nutrition: Goal: Adequate nutrition will be maintained Outcome: Progressing   Problem: Pain Managment: Goal: General experience of comfort will improve Outcome: Progressing   Problem: Safety: Goal: Ability to remain free from injury will improve Outcome: Progressing   Problem: Skin Integrity: Goal: Risk for impaired skin integrity will decrease Outcome: Progressing

## 2017-12-15 DIAGNOSIS — Z9181 History of falling: Secondary | ICD-10-CM | POA: Diagnosis not present

## 2017-12-15 DIAGNOSIS — S2242XD Multiple fractures of ribs, left side, subsequent encounter for fracture with routine healing: Secondary | ICD-10-CM | POA: Diagnosis not present

## 2017-12-15 DIAGNOSIS — I10 Essential (primary) hypertension: Secondary | ICD-10-CM | POA: Diagnosis not present

## 2017-12-15 DIAGNOSIS — R2689 Other abnormalities of gait and mobility: Secondary | ICD-10-CM | POA: Diagnosis not present

## 2017-12-15 DIAGNOSIS — D649 Anemia, unspecified: Secondary | ICD-10-CM | POA: Diagnosis not present

## 2017-12-15 DIAGNOSIS — F329 Major depressive disorder, single episode, unspecified: Secondary | ICD-10-CM | POA: Diagnosis not present

## 2017-12-15 DIAGNOSIS — F0280 Dementia in other diseases classified elsewhere without behavioral disturbance: Secondary | ICD-10-CM | POA: Diagnosis not present

## 2017-12-15 DIAGNOSIS — G3183 Dementia with Lewy bodies: Secondary | ICD-10-CM | POA: Diagnosis not present

## 2017-12-15 DIAGNOSIS — M6281 Muscle weakness (generalized): Secondary | ICD-10-CM | POA: Diagnosis not present

## 2017-12-16 NOTE — Telephone Encounter (Signed)
Erroneous encounter

## 2017-12-17 LAB — CULTURE, BLOOD (ROUTINE X 2): Culture: NO GROWTH

## 2017-12-22 ENCOUNTER — Ambulatory Visit (INDEPENDENT_AMBULATORY_CARE_PROVIDER_SITE_OTHER): Payer: Medicare Other | Admitting: Family Medicine

## 2017-12-22 ENCOUNTER — Encounter: Payer: Self-pay | Admitting: Family Medicine

## 2017-12-22 VITALS — BP 145/75 | HR 70 | Temp 98.4°F | Ht 72.0 in | Wt 168.5 lb

## 2017-12-22 DIAGNOSIS — C180 Malignant neoplasm of cecum: Secondary | ICD-10-CM | POA: Diagnosis not present

## 2017-12-22 DIAGNOSIS — D729 Disorder of white blood cells, unspecified: Secondary | ICD-10-CM

## 2017-12-22 DIAGNOSIS — G3183 Dementia with Lewy bodies: Secondary | ICD-10-CM

## 2017-12-22 DIAGNOSIS — J181 Lobar pneumonia, unspecified organism: Secondary | ICD-10-CM | POA: Diagnosis not present

## 2017-12-22 DIAGNOSIS — E440 Moderate protein-calorie malnutrition: Secondary | ICD-10-CM | POA: Diagnosis not present

## 2017-12-22 DIAGNOSIS — F0281 Dementia in other diseases classified elsewhere with behavioral disturbance: Secondary | ICD-10-CM | POA: Diagnosis not present

## 2017-12-22 DIAGNOSIS — D696 Thrombocytopenia, unspecified: Secondary | ICD-10-CM | POA: Diagnosis not present

## 2017-12-22 DIAGNOSIS — J189 Pneumonia, unspecified organism: Secondary | ICD-10-CM

## 2017-12-22 NOTE — Progress Notes (Signed)
Dr. Frederico Hamman T. Takia Runyon, MD, [facility] [place], [address] Phone: [phone] Fax: [phone]  12/22/2017  Patient: John Summers, MRN: [id number], DOB: 09-15-1932, 84 y.o.  Primary Physician:  Owens Loffler, MD   Chief Complaint  Patient presents with  . Hospitalization Follow-up   Subjective:   John Summers is a 84 y.o. very pleasant male patient who presents with the following:  DATE OF ADMISSION:  12/12/2017     DATE OF DISCHARGE: 12/14/2017  Pleasant patient with Lewy body dementia who I recall quite well as well as his wife John Summers who passed away last year due to complications from liver failure.  He has been in an several different nursing home settings since his wife passed, and was just discharged from having left lower lobe pneumonia.  Significant prior history is fall with multiple rib fractures prior to this in February.  In terms of his pneumonia, he is feeling much better, doing much better according to his stepdaughter.  Status post initial treatment with vancomycin and cefepime, but transitioned to Augmentin.  He is now moved back into his stepdaughter's home with a sitter present for about half of the day while she is at work.  Thus far, this seems to be a better situation and more agreeable to him in general.  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  He also has a low white blood cell count now which is ranged from about 1.8-2.9 over the last month.  His depression has done better according to his stepdaughter on an increased dose of Prozac as well as Seroquel.  PNA on Augmentin Initially on Cefepime and Vanc  Now living with - John Summers. HCPOA At home and home with sitters. Eating well.    Past Medical History, Surgical History, Social History, Family History, Problem List, Medications, and Allergies have been reviewed and updated if relevant.  Patient  Active Problem List   Diagnosis Date Noted  . Lewy body dementia with behavioral disturbance 11/16/2014    Priority: High  . Adenocarcinoma of cecum ([place]) 03/19/2012    Priority: High  . Sleep apnea     Priority: Medium  . Essential hypertension     Priority: Medium  . Pneumonia 12/12/2017  . Malnutrition of moderate degree 11/03/2017  . Rib fractures 10/30/2017  . 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 Pancytopenia, acquired ([place]) 08/02/2017  . Visual hallucination 08/02/2017  . Generalized osteoarthritis 03/05/2017  . AF (paroxysmal atrial fibrillation) ([place]) 02/01/2017  . Near syncope 01/06/2017  . Gait disturbance 12/30/2016  . MGUS (monoclonal gammopathy of unknown significance) 05/10/2016  . Pancreatic mass 04/24/2016  . Thrombocytopenia ([place]) 10/04/2015  . Uncontrolled REM sleep behavior disorder 11/16/2014  . Tremor of right hand 11/16/2014  . Major depressive disorder, recurrent episode, in partial remission with anxious distress ([place])   . REM sleep behavior disorder 12/07/2013  . Incisional hernia, without obstruction or gangrene 11/14/2012  . GERD (gastroesophageal reflux disease) 03/19/2012    Past Medical History:  Diagnosis Date  . Colon adenocarcinoma ([place])   . Complication of anesthesia    severe confusion  . GERD (gastroesophageal reflux disease)   . History of colon polyps   . History of gallstones   . Hx of transfusion of platelets   . Hypertension   . ITP (idiopathic thrombocytopenic purpura)    Dr. Grayland Ormond follows  . Kidney stones   .  Lewy body dementia with behavioral disturbance   . Major depressive disorder, recurrent episode, in partial remission with anxious distress ([place])   . Pancreatitis 2/11  . Pollen allergies   . Ribs, multiple fractures   . Skin cancer    ears  . Sleep apnea    "won't wear mask anymore" (12/22/2012)  . Unspecified essential hypertension     Past Surgical History:  Procedure Laterality Date  . CARDIAC  CATHETERIZATION  2000's   "tx'd w/acid reflux RX" (12/22/2012)  . CHOLECYSTECTOMY  2011  . ESOPHAGEAL DILATION  1990's?  Marland Kitchen HERNIA REPAIR  12/22/2012   IHR w/mesh  . INGUINAL HERNIA REPAIR Left [id number]  . INSERTION OF MESH N/A 12/22/2012   Procedure: INSERTION OF MESH;  Surgeon: Harl Bowie, MD;  Location: [facility];  Service: General;  Laterality: N/A;  . LITHOTRIPSY  2008   x2  . PARTIAL COLECTOMY  03/21/2012   Procedure: PARTIAL COLECTOMY;  Surgeon: Harl Bowie, MD;  Location: [facility];  Service: General;  Laterality: Right;  . PILONIDAL CYST EXCISION  1960's?  Marland Kitchen SKIN CANCER EXCISION     "face; ?left ear" (12/22/2012)  . TONSILLECTOMY AND ADENOIDECTOMY  1946  . TOTAL HIP ARTHROPLASTY Left ~ 2002  . UPPER GASTROINTESTINAL ENDOSCOPY    . VENTRAL HERNIA REPAIR N/A 12/22/2012   Procedure: HERNIA REPAIR INCISIONAL ;  Surgeon: Harl Bowie, MD;  Location: [facility];  Service: General;  Laterality: N/A;    Social History   Socioeconomic History  . Marital status: Widowed    Spouse name: John Summers  . Number of children: 2  . Years of education: college  . Highest education level: Not on file  Occupational History    Employer: RETIRED    Comment: Retired, Production manager, [employer]  Social Needs  . Financial resource strain: Not on file  . Food insecurity:    Worry: Not on file    Inability: Not on file  . Transportation needs:    Medical: Not on file    Non-medical: Not on file  Tobacco Use  . Smoking status: Former Smoker    Types: Pipe  . Smokeless tobacco: Never Used  . Tobacco comment: i4/06/2013 "only smoked pipe n college 1958"  Substance and Sexual Activity  . Alcohol use: No    Alcohol/week: 0.0 oz  . Drug use: No  . Sexual activity: Never  Lifestyle  . Physical activity:    Days per week: Not on file    Minutes per session: Not on file  . Stress: Not on file  Relationships  . Social connections:    Talks on phone: Not on file    Gets together:  Not on file    Attends religious service: Not on file    Active member of club or organization: Not on file    Attends meetings of clubs or organizations: Not on file    Relationship status: Not on file  . Intimate partner violence:    Fear of current or ex partner: Not on file    Emotionally abused: Not on file    Physically abused: Not on file    Forced sexual activity: Not on file  Other Topics Concern  . Not on file  Social History Narrative   Patient is widowed John Summers) and lives at home with his daughter now   Patient has two children and his wife has two children..   Patient is retired, Office manager   Patient has  a college education.   Patient is right-handed.   Patient drinks 1-2 cups of coffee daily.    Family History  Problem Relation Age of Onset  . Colon cancer Mother   . Heart disease Mother   . Hypertension Mother   . Alcohol abuse Father   . Esophageal cancer Neg Hx   . Rectal cancer Neg Hx   . Stomach cancer Neg Hx     Allergies  Allergen Reactions  . Aspirin Other (See Comments)  . Chicken Allergy Nausea And Vomiting    Unknown   . Nsaids     Low platelets  . Other Nausea And Vomiting    [place] per daughter     Medication list reviewed and updated in full in [facility].   GEN: No acute illnesses, no fevers, chills. GI: No n/v/d, eating normally Pulm: No SOB Interactive and getting along well at home.  Otherwise, ROS is as per the HPI.  Objective:   BP (!) 145/75   Pulse 70   Temp 98.4 F (36.9 C) (Oral)   Ht 6' (1.829 m)   Wt 168 lb 8 oz (76.4 kg)   BMI 22.85 kg/m   GEN: WDWN, NAD, Non-toxic, pleasantly demented.  HEENT: Atraumatic, Normocephalic. Neck supple. No masses, No LAD. Ears and Summers: No external deformity. CV: RRR, No M/G/R. No JVD. No thrill. No extra heart sounds. PULM: CTA B, no wheezes, crackles, rhonchi. No retractions. No resp. distress. No accessory muscle use. EXTR: No c/c/e NEURO Normal gait.  PSYCH:  Normally interactive. Conversant. Not depressed or anxious appearing.  Calm demeanor.   Laboratory and Imaging Data: Hospital data reviewed.   Assessment and Plan:   Community acquired pneumonia of left lower lobe of lung ([place])  Thrombocytopenia ([place]) - Plan: Ambulatory referral to Hematology  Abnormal white blood cell (WBC) count - Plan: Ambulatory referral to Hematology  Adenocarcinoma of cecum ([facility] [place]) - Plan: Ambulatory referral to Hematology  Malnutrition of moderate degree  Lewy body dementia with behavioral disturbance  Overall, he appears to be doing better than I would have anticipated.  He appears to have adjusted well back in the transition into the home with home health and sitters available.  Biggest issues from my standpoint are nutrition as well as strength.  He is going to keep working with home health PT.  White blood cell count as well as platelet count are low.  He has not seen his hematologist in some time.  I think that that is reasonable to touch base with Dr. Grayland Ormond.  I discussed goals of care with the patient and his healthcare power of attorney, his daughter-in-law.  Previously I had extensive conversations with his wife, who is now passed away.  They do not really want to have aggressive measures taken, and do not really want to have aggressive interventions done including surveillance colonoscopy, and he does have a prior history of cancer at the cecum.  This also includes follow-up of a pancreatic lesion that was seen previously -not seen on most recent scan. I asked Alyse Low to bring in [facility] doccuments, so that we can keep them in the [facility] computer systems.   Follow-up: Return in about 3 months (around 03/23/2018).  Orders Placed This Encounter  Procedures  . Ambulatory referral to Hematology    Signed,  Maud Deed. Kiree Dejarnette, MD   Allergies as of 12/22/2017      Reactions   Aspirin Other (See Comments)   Chicken Allergy Nausea And  Vomiting    Unknown   Nsaids    Low platelets   Other Nausea And Vomiting   [place] per daughter       Medication List        Accurate as of 12/22/17 11:59 PM. Always use your most recent med list.          acetaminophen 500 MG tablet Commonly known as:  TYLENOL Take 500 mg by mouth 2 (two) times daily.   amoxicillin-clavulanate 875-125 MG tablet Commonly known as:  AUGMENTIN Take 1 tablet by mouth 2 (two) times daily.   donepezil 10 MG tablet Commonly known as:  ARICEPT take 1 tablet by mouth once daily   esomeprazole 40 MG capsule Commonly known as:  NEXIUM Take 40 mg by mouth daily at 12 noon.   FLUoxetine 20 MG tablet Commonly known as:  PROZAC Take 2 tablets (40 mg total) by mouth daily. 2 tab of 20 mg once a day.   hydrOXYzine 10 MG tablet Commonly known as:  ATARAX/VISTARIL take 1 tablet by mouth at bedtime   Medical Compression Socks Misc by Does not apply route.   Melatonin 5 MG Tabs Take 1 tablet (5 mg total) by mouth at bedtime.   QUEtiapine 25 MG tablet Commonly known as:  SEROQUEL Take 1 tablet (25 mg total) by mouth at bedtime.   traMADol 50 MG tablet Commonly known as:  ULTRAM Take 1 tablet (50 mg total) by mouth every 6 (six) hours as needed for moderate pain.

## 2017-12-26 NOTE — Progress Notes (Deleted)
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  INTERVAL HISTORY: Patient returns to clinic today for laboratory work and routine clinical evaluation. He feels his memory continues to decline, but otherwise feels well. He has had no recent fevers.  He denies any easy bleeding or bruising.  He denies any weakness or fatigue. He has a good appetite and has maintained his weight.  He denies any chest pain, shortness breath, cough, or hemoptysis.  He denies any bony pain. He denies nausea or constipation.  He denies any melena or hematochezia.  Patient offers no further specific complaints today.   REVIEW OF SYSTEMS:   Review of Systems  Constitutional: Positive for malaise/fatigue. Negative for fever and weight loss.  Eyes: Negative.   Respiratory: Negative.  Negative for cough and shortness of breath.   Cardiovascular: Negative.  Negative for chest pain.  Gastrointestinal: Negative.  Negative for diarrhea and vomiting.  Genitourinary: Negative.   Musculoskeletal: Negative.   Skin: Negative.   Neurological: Positive for weakness.  Endo/Heme/Allergies: Negative.   Psychiatric/Behavioral: Positive for memory loss. The patient is nervous/anxious.     As per HPI. Otherwise, a complete review of systems is negatve.  PAST MEDICAL HISTORY: Past Medical History:  Diagnosis Date  . Colon adenocarcinoma ([place])   . Complication of anesthesia      severe confusion  . GERD (gastroesophageal reflux disease)   . History of colon polyps   . History of gallstones   . Hx of transfusion of platelets   . Hypertension   . ITP (idiopathic thrombocytopenic purpura)    Dr. Grayland Ormond follows  . Kidney stones   . Lewy body dementia with behavioral disturbance   . Major depressive disorder, recurrent episode, in partial remission with anxious distress ([place])   . Pancreatitis 2/11  . Pollen allergies   . Ribs, multiple fractures   . Skin cancer    ears  . Sleep apnea    "won't wear mask anymore" (12/22/2012)  . Unspecified essential hypertension     PAST SURGICAL HISTORY: Past Surgical History:  Procedure Laterality Date  . CARDIAC CATHETERIZATION  2000's   "tx'd w/acid reflux RX" (12/22/2012)  . CHOLECYSTECTOMY  2011  . ESOPHAGEAL DILATION  1990's?  Marland Kitchen HERNIA REPAIR  12/22/2012   IHR w/mesh  . INGUINAL HERNIA REPAIR Left [id number]  . INSERTION OF MESH N/A 12/22/2012   Procedure: INSERTION OF MESH;  Surgeon: Harl Bowie, MD;  Location: [facility];  Service: General;  Laterality: N/A;  . LITHOTRIPSY  2008   x2  . PARTIAL COLECTOMY  03/21/2012   Procedure: PARTIAL COLECTOMY;  Surgeon: Harl Bowie, MD;  Location: [facility];  Service: General;  Laterality: Right;  . PILONIDAL CYST EXCISION  1960's?  Marland Kitchen SKIN CANCER EXCISION     "face; ?left ear" (12/22/2012)  . TONSILLECTOMY AND ADENOIDECTOMY  1946  . TOTAL HIP ARTHROPLASTY Left ~ 2002  . UPPER GASTROINTESTINAL ENDOSCOPY    . VENTRAL HERNIA REPAIR N/A 12/22/2012   Procedure: HERNIA REPAIR INCISIONAL ;  Surgeon: Harl Bowie,  MD;  [facility];  Service: General;  Laterality: N/A;    FAMILY HISTORY Family History  Problem Relation Age of Onset  . Colon cancer Mother   . Heart disease Mother   . Hypertension Mother   . Alcohol abuse Father   . Esophageal cancer Neg Hx   . Rectal cancer Neg Hx   . Stomach cancer Neg Hx        ADVANCED DIRECTIVES:    HEALTH  MAINTENANCE: Social History   Tobacco Use  . Smoking status: Former Smoker    Types: Pipe  . Smokeless tobacco: Never Used  . Tobacco comment: i4/06/2013 "only smoked pipe n college 1958"  Substance Use Topics  . Alcohol use: No    Alcohol/week: 0.0 oz  . Drug use: No     Colonoscopy:  PAP:  Bone density:  Lipid panel:  Allergies  Allergen Reactions  . Aspirin Other (See Comments)  . Chicken Allergy Nausea And Vomiting    Unknown   . Nsaids     Low platelets  . Other Nausea And Vomiting    [place] per daughter     Current Outpatient Medications  Medication Sig Dispense Refill  . acetaminophen (TYLENOL) 500 MG tablet Take 500 mg by mouth 2 (two) times daily.    Marland Kitchen amoxicillin-clavulanate (AUGMENTIN) 875-125 MG tablet Take 1 tablet by mouth 2 (two) times daily. 16 tablet 0  . donepezil (ARICEPT) 10 MG tablet take 1 tablet by mouth once daily 30 tablet 5  . Elastic Bandages & Supports (MEDICAL COMPRESSION SOCKS) MISC by Does not apply route.    Marland Kitchen esomeprazole (NEXIUM) 40 MG capsule Take 40 mg by mouth daily at 12 noon.    Marland Kitchen FLUoxetine (PROZAC) 20 MG tablet Take 2 tablets (40 mg total) by mouth daily. 2 tab of 20 mg once a day.    . hydrOXYzine (ATARAX/VISTARIL) 10 MG tablet take 1 tablet by mouth at bedtime 90 tablet 0  . Melatonin 5 MG TABS Take 1 tablet (5 mg total) by mouth at bedtime. 60 tablet 5  . QUEtiapine (SEROQUEL) 25 MG tablet Take 1 tablet (25 mg total) by mouth at bedtime. 30 tablet 10  . traMADol (ULTRAM) 50 MG tablet Take 1 tablet (50 mg total) by mouth every 6 (six) hours as needed for moderate pain. 30 tablet 1   Current Facility-Administered Medications  Medication Dose Route Frequency Provider Last Rate Last Dose  . calcium-vitamin D (OSCAL WITH D) 500-200 MG-UNIT per tablet 1 tablet  1 tablet Oral Q breakfast Ngetich, Dinah C, NP        OBJECTIVE: There were no vitals filed for this visit.   There is no height or weight on file to calculate BMI.     ECOG FS:0 - Asymptomatic  General: Well-developed, well-nourished, no acute distress. Eyes: Pink conjunctiva, anicteric sclera. Lungs: Clear to auscultation bilaterally. Heart: Regular rate and rhythm. No rubs, murmurs, or gallops. Abdomen: Soft, nontender, nondistended. No organomegaly noted, normoactive bowel sounds. Musculoskeletal: No edema, cyanosis, or clubbing. Neuro: Alert, answering all questions appropriately. Cranial nerves grossly intact. Skin: No rashes or petechiae noted. Psych: Normal affect.   LAB RESULTS:  Lab Results  Component Value Date   NA 142 12/14/2017   K 3.6 12/14/2017   CL 111 12/14/2017   CO2 26 12/14/2017   GLUCOSE 91 12/14/2017   BUN 18 12/14/2017   CREATININE 0.79 12/14/2017   CALCIUM 8.3 (L) 12/14/2017   PROT 7.1 12/12/2017   ALBUMIN  3.4 (L) 12/12/2017   AST 28 12/12/2017   ALT 21 12/12/2017   ALKPHOS 79 12/12/2017   BILITOT 0.7 12/12/2017   GFRNONAA >60 12/14/2017   GFRAA >60 12/14/2017    Lab Results  Component Value Date   WBC 2.0 (L) 12/14/2017   NEUTROABS 2.3 12/12/2017   HGB 10.2 (L) 12/14/2017   HCT 30.5 (L) 12/14/2017   MCV 108.5 (H) 12/14/2017   PLT 86 (L) 12/14/2017   Lab Results  Component Value Date   TOTALPROTELP 7.2 05/06/2016   ALBUMINELP 3.7 05/06/2016   A1GS 0.2 05/06/2016   A2GS 0.6 05/06/2016   BETS 2.1 (H) 05/06/2016   GAMS 0.6 05/06/2016   MSPIKE 1.5 (H) 05/06/2016   SPEI Comment 05/06/2016     STUDIES: Dg Chest 2 View  Result Date: 12/12/2017 CLINICAL DATA:  Fall.  Left-sided rib fractures.  Fever and cough. EXAM: CHEST - 2 VIEW COMPARISON:  10/31/2017 FINDINGS: The heart size and mediastinal contours are within normal limits. There is diminished aeration to the left lower lobe suspicious for pneumonia. Left lateral rib fracture deformities are again noted. The visualized skeletal structures are unremarkable. IMPRESSION: 1. New left lower lobe opacities worrisome for pneumonia. 2. Left rib fractures as  before. Electronically Signed   By: Kerby Moors M.D.   On: 12/12/2017 11:23   US Abdomen Complete  Result Date: 12/12/2017 CLINICAL DATA:  84 year old male with abdominal pain and LEFT-sided bruising following fall. History of cholecystectomy. EXAM: ABDOMEN ULTRASOUND COMPLETE COMPARISON:  None. FINDINGS: Portions of the abdomen not visualized secondary to bowel gas and patient's inability to move. Gallbladder: Not visualized compatible with cholecystectomy. Common bile duct: Diameter: 8 mm. No intrahepatic or extrahepatic biliary dilatation. Liver: No focal lesion identified. Within normal limits in parenchymal echogenicity. Portal vein is patent on color Doppler imaging with normal direction of blood flow towards the liver. IVC: Not visualized Pancreas: Not visualized Spleen: Unremarkable. No definite focal splenic abnormality or adjacent fluid noted. Right Kidney: Length: 11.6 cm. Echogenicity within normal limits. No mass or hydronephrosis visualized. Left Kidney: Length: 11.9 cm. Echogenicity within normal limits. No mass or hydronephrosis visualized. Abdominal aorta: Not visualized Other findings: None. IMPRESSION: 1. No evidence of acute or significant abnormality. 2. IVC, pancreas and aorta not visualized. Electronically Signed   By: Margarette Canada M.D.   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  PLAN:    1.  MGUS: Patient's M spike remains essentially unchanged at 1.5.  Given the stability of his lab work and the fact that he is asymptomatic, a bone marrow biopsy is not necessary at this time. Previously, metastatic bone survey and CT scan were negative.  Because he has a suppressed IgA and IgM, it is possible that this is smoldering IgG multiple myeloma rather than MGUS. Smoldering multiple myeloma does not require treatment.  If he shows evidence of further endorgan damage would then proceed with a  bone marrow biopsy and consideration of treatment.  Return to clinic in 3 months for cbc and then in 6 months for repeat laboratory work and further evaluation.   2.  Thrombocytopenia: Patient's platelet count appears proximally his baseline, monitor.  3.  Leukopenia: Stable and unchanged, monitor. 4.  Stage IIa adenocarcinoma of the cecum: Patient is stage IIa with no high risk features, therefore adjuvant chemotherapy was not necessary.  CEA continues to be within normal limits.   5.  Lewy Body  Dementia:  Continue follow up with neurologist.  6.  Pancreas lesion: CT scan results reviewed independently and reported as above with increasing size of lesion. Patient reports he has an MRI later this week to further evaluate. Continue monitoring and diagnostic planning per GI.  Patient expressed understanding and was in agreement with this plan. He also understands that He can call clinic at any time with any questions, concerns, or complaints.   Lloyd Huger, MD   12/26/2017 11:10 PM

## 2017-12-28 ENCOUNTER — Inpatient Hospital Stay: Payer: Medicare Other | Admitting: Oncology

## 2017-12-28 ENCOUNTER — Encounter: Payer: Self-pay | Admitting: Oncology

## 2018-01-02 NOTE — Progress Notes (Signed)
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  INTERVAL HISTORY: Patient last evaluated in clinic on August 2017.  He is referred back for further evaluation.  His performance status continues to slowly decline mostly secondary to his underlying dementia.  He has no neurologic complaints.  He denies any recent fevers or illnesses.  He has a fair appetite and denies weight loss.  He has no chest pain or shortness of breath. He denies any bony pain.  He denies any nausea, vomiting, constipation, or diarrhea.  He has no melena or hematochezia.  He has no urinary complaints.  Patient offers no further specific complaints today.    REVIEW OF SYSTEMS:   Review of Systems  Constitutional: Positive for malaise/fatigue. Negative for fever and weight loss.  Eyes: Negative.   Respiratory: Negative.  Negative for cough and shortness of breath.   Cardiovascular: Negative.  Negative for chest pain and leg swelling.  Gastrointestinal: Negative.  Negative for blood in stool, diarrhea, melena and vomiting.  Genitourinary: Negative.   Musculoskeletal: Negative.   Skin: Negative.   Neurological: Positive for weakness.  Endo/Heme/Allergies: Negative.  Does not bruise/bleed easily.  Psychiatric/Behavioral: Positive for memory loss. The patient is not nervous/anxious.     As per HPI. Otherwise, a complete review of systems is negative.  PAST MEDICAL  HISTORY: Past Medical History:  Diagnosis Date  . Colon adenocarcinoma ([place])   . Complication of anesthesia    severe confusion  . GERD (gastroesophageal reflux disease)   . History of colon polyps   . History of gallstones   . Hx of transfusion of platelets   . Hypertension   . ITP (idiopathic thrombocytopenic purpura)    Dr. Grayland Ormond follows  . Kidney stones   . Lewy body dementia with behavioral disturbance   . Major depressive disorder, recurrent episode, in partial remission with anxious distress ([place])   . Pancreatitis 2/11  . Pollen allergies   . Ribs, multiple fractures   . Skin cancer    ears  . Sleep apnea    "won't wear mask anymore" (12/22/2012)  . Unspecified essential hypertension     PAST SURGICAL HISTORY: Past Surgical History:  Procedure Laterality Date  . CARDIAC CATHETERIZATION  2000's   "tx'd w/acid reflux RX" (12/22/2012)  . CHOLECYSTECTOMY  2011  . ESOPHAGEAL DILATION  1990's?  Marland Kitchen HERNIA REPAIR  12/22/2012   IHR w/mesh  . INGUINAL HERNIA REPAIR Left [id number]  . INSERTION OF MESH N/A 12/22/2012   Procedure: INSERTION OF MESH;  Surgeon: Harl Bowie, MD;  Location: [facility];  Service: General;  Laterality: N/A;  . LITHOTRIPSY  2008   x2  . PARTIAL COLECTOMY  03/21/2012   Procedure: PARTIAL COLECTOMY;  Surgeon: Harl Bowie, MD;  Location: [facility];  Service: General;  Laterality: Right;  . PILONIDAL CYST EXCISION  1960's?  Marland Kitchen SKIN CANCER EXCISION     "face; ?left ear" (12/22/2012)  . TONSILLECTOMY AND ADENOIDECTOMY  1946  . TOTAL HIP ARTHROPLASTY Left ~ 2002  . UPPER GASTROINTESTINAL ENDOSCOPY    .  VENTRAL HERNIA REPAIR N/A 12/22/2012   Procedure: HERNIA REPAIR INCISIONAL ;  Surgeon: Harl Bowie, MD;  Location: [facility];  Service: General;  Laterality: N/A;    FAMILY HISTORY Family History  Problem Relation Age of Onset  . Colon cancer Mother   . Heart disease Mother   . Hypertension Mother   . Alcohol abuse Father   . Esophageal cancer  Neg Hx   . Rectal cancer Neg Hx   . Stomach cancer Neg Hx        ADVANCED DIRECTIVES:    HEALTH MAINTENANCE: Social History   Tobacco Use  . Smoking status: Former Smoker    Types: Pipe  . Smokeless tobacco: Never Used  . Tobacco comment: i4/06/2013 "only smoked pipe n college 1958"  Substance Use Topics  . Alcohol use: No    Alcohol/week: 0.0 oz  . Drug use: No     Colonoscopy:  PAP:  Bone density:  Lipid panel:  Allergies  Allergen Reactions  . Aspirin Other (See Comments)  . Chicken Allergy Nausea And Vomiting    Unknown   . Nsaids     Low platelets  . Other Nausea And Vomiting    [place] per daughter     Current Outpatient Medications  Medication Sig Dispense Refill  . acetaminophen (TYLENOL) 500 MG tablet Take 500 mg by mouth 2 (two) times daily.    Marland Kitchen donepezil (ARICEPT) 10 MG tablet take 1 tablet by mouth once daily 30 tablet 5  . Elastic Bandages & Supports (MEDICAL COMPRESSION SOCKS) MISC by Does not apply route.    Marland Kitchen esomeprazole (NEXIUM) 40 MG capsule Take 40 mg by mouth daily at 12 noon.    Marland Kitchen FLUoxetine (PROZAC) 20 MG tablet Take 2 tablets (40 mg total) by mouth daily. 2 tab of 20 mg once a day.    . Melatonin 5 MG TABS Take 1 tablet (5 mg total) by mouth at bedtime. 60 tablet 5  . QUEtiapine (SEROQUEL) 25 MG tablet Take 1 tablet (25 mg total) by mouth at bedtime. 30 tablet 10  . traMADol (ULTRAM) 50 MG tablet Take 1 tablet (50 mg total) by mouth every 6 (six) hours as needed for moderate pain. 30 tablet 1  . hydrOXYzine (ATARAX/VISTARIL) 10 MG tablet Take 1 tablet (10 mg total) by mouth at bedtime. 90 tablet 1   Current Facility-Administered Medications  Medication Dose Route Frequency Provider Last Rate Last Dose  . calcium-vitamin D (OSCAL WITH D) 500-200 MG-UNIT per tablet 1 tablet  1 tablet Oral Q breakfast Ngetich, Dinah C, NP        OBJECTIVE: Vitals:   01/03/18 1525  BP: (!) 144/87  Pulse: 68  Resp: 18  Temp: (!) 97.4 F (36.3 C)      Body mass index is 23.34 kg/m.    ECOG FS:2 - Symptomatic, <50% confined to bed  General: Well-developed, well-nourished, no acute distress.  Sitting in a wheelchair. Eyes: Pink conjunctiva, anicteric sclera. HEENT: Normocephalic, moist mucous membranes, clear oropharnyx. Lungs: Clear to auscultation bilaterally. Heart: Regular rate and rhythm. No rubs, murmurs, or gallops. Abdomen: Soft, nontender, nondistended. No organomegaly noted, normoactive bowel sounds. Musculoskeletal: No edema, cyanosis, or clubbing. Neuro: Alert, answering all questions appropriately. Cranial nerves grossly intact. Skin: No rashes or petechiae noted. Psych: Normal affect.   LAB RESULTS:  Lab Results  Component Value Date   NA 141 01/03/2018   K 4.1 01/03/2018   CL 108 01/03/2018   CO2 28 01/03/2018  GLUCOSE 95 01/03/2018   BUN 20 01/03/2018   CREATININE 0.84 01/03/2018   CALCIUM 8.7 (L) 01/03/2018   PROT 7.1 12/12/2017   ALBUMIN 3.4 (L) 12/12/2017   AST 28 12/12/2017   ALT 21 12/12/2017   ALKPHOS 79 12/12/2017   BILITOT 0.7 12/12/2017   GFRNONAA >60 01/03/2018   GFRAA >60 01/03/2018    Lab Results  Component Value Date   WBC 2.4 (L) 01/03/2018   NEUTROABS 1.3 (L) 01/03/2018   HGB 10.5 (L) 01/03/2018   HCT 31.0 (L) 01/03/2018   MCV 108.0 (H) 01/03/2018   PLT 95 (L) 01/03/2018   Lab Results  Component Value Date   TOTALPROTELP 7.0 01/03/2018   ALBUMINELP 3.5 01/03/2018   A1GS 0.2 01/03/2018   A2GS 0.5 01/03/2018   BETS 2.2 (H) 01/03/2018   GAMS 0.5 01/03/2018   MSPIKE 1.6 (H) 01/03/2018   SPEI Comment 01/03/2018     STUDIES: Dg Chest 2 View  Result Date: 12/12/2017 CLINICAL DATA:  Fall.  Left-sided rib fractures.  Fever and cough. EXAM: CHEST - 2 VIEW COMPARISON:  10/31/2017 FINDINGS: The heart size and mediastinal contours are within normal limits. There is diminished aeration to the left lower lobe suspicious for pneumonia. Left lateral rib fracture deformities are again  noted. The visualized skeletal structures are unremarkable. IMPRESSION: 1. New left lower lobe opacities worrisome for pneumonia. 2. Left rib fractures as before. Electronically Signed   By: Kerby Moors M.D.   On: 12/12/2017 11:23   US Abdomen Complete  Result Date: 12/12/2017 CLINICAL DATA:  84 year old male with abdominal pain and LEFT-sided bruising following fall. History of cholecystectomy. EXAM: ABDOMEN ULTRASOUND COMPLETE COMPARISON:  None. FINDINGS: Portions of the abdomen not visualized secondary to bowel gas and patient's inability to move. Gallbladder: Not visualized compatible with cholecystectomy. Common bile duct: Diameter: 8 mm. No intrahepatic or extrahepatic biliary dilatation. Liver: No focal lesion identified. Within normal limits in parenchymal echogenicity. Portal vein is patent on color Doppler imaging with normal direction of blood flow towards the liver. IVC: Not visualized Pancreas: Not visualized Spleen: Unremarkable. No definite focal splenic abnormality or adjacent fluid noted. Right Kidney: Length: 11.6 cm. Echogenicity within normal limits. No mass or hydronephrosis visualized. Left Kidney: Length: 11.9 cm. Echogenicity within normal limits. No mass or hydronephrosis visualized. Abdominal aorta: Not visualized Other findings: None. IMPRESSION: 1. No evidence of acute or significant abnormality. 2. IVC, pancreas and aorta not visualized. Electronically Signed   By: Margarette Canada M.D.   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  PLAN:    1.  MGUS: Patient's M spike from January 03, 2018 is unchanged previous at 1.6. Previously, metastatic bone survey and CT scan were negative.  He continues to have an elevated IgG with decreased IgM and IgA.  It is possible that this is smoldering IgG multiple myeloma, but this would take a bone marrow biopsy to diagnose.  This is not necessary  treatment will be difficult given his decreased performance status.  No intervention is needed.  Return to clinic in 4 months with repeat laboratory work and further evaluation.   2.  Thrombocytopenia: Patient's platelet count remains decreased, and slightly above his baseline at 95.  No intervention is needed. 3.  Leukopenia: Decreased and stable.  4.  History of stage IIa adenocarcinoma of the cecum: Patient noted to have no high risk features, therefore adjuvant chemotherapy was not necessary.  CEA  is 2.3 and within normal limits.  No intervention is needed.  If patient had a recurrence, any further treatment would be difficult. 5.  Lewy Body Dementia:  Continue follow up with neurologist.  6.  Pancreas lesion: Most recent CT scan makes no mention of a pancreatic lesion.  Approximately 30 minutes was spent in discussion of which greater than 50% was consultation.  Patient expressed understanding and was in agreement with this plan. He also understands that He can call clinic at any time with any questions, concerns, or complaints.   Lloyd Huger, MD   01/07/2018 9:36 AM

## 2018-01-03 ENCOUNTER — Inpatient Hospital Stay: Payer: Medicare Other | Attending: Oncology | Admitting: Oncology

## 2018-01-03 ENCOUNTER — Other Ambulatory Visit: Payer: Self-pay

## 2018-01-03 ENCOUNTER — Inpatient Hospital Stay: Payer: Medicare Other

## 2018-01-03 ENCOUNTER — Encounter: Payer: Self-pay | Admitting: Oncology

## 2018-01-03 VITALS — BP 144/87 | HR 68 | Temp 97.4°F | Resp 18 | Wt 172.1 lb

## 2018-01-03 DIAGNOSIS — G3183 Dementia with Lewy bodies: Secondary | ICD-10-CM | POA: Diagnosis not present

## 2018-01-03 DIAGNOSIS — D696 Thrombocytopenia, unspecified: Secondary | ICD-10-CM | POA: Insufficient documentation

## 2018-01-03 DIAGNOSIS — Z85038 Personal history of other malignant neoplasm of large intestine: Secondary | ICD-10-CM | POA: Diagnosis not present

## 2018-01-03 DIAGNOSIS — C180 Malignant neoplasm of cecum: Secondary | ICD-10-CM

## 2018-01-03 DIAGNOSIS — Z79899 Other long term (current) drug therapy: Secondary | ICD-10-CM | POA: Insufficient documentation

## 2018-01-03 DIAGNOSIS — D472 Monoclonal gammopathy: Secondary | ICD-10-CM

## 2018-01-03 DIAGNOSIS — Z87891 Personal history of nicotine dependence: Secondary | ICD-10-CM | POA: Insufficient documentation

## 2018-01-03 DIAGNOSIS — D72819 Decreased white blood cell count, unspecified: Secondary | ICD-10-CM | POA: Insufficient documentation

## 2018-01-03 LAB — CBC WITH DIFFERENTIAL/PLATELET
Basophils Absolute: 0 10*3/uL (ref 0–0.1)
Basophils Relative: 0 %
EOS PCT: 0 %
Eosinophils Absolute: 0 10*3/uL (ref 0–0.7)
HCT: 31 % — ABNORMAL LOW (ref 40.0–52.0)
Hemoglobin: 10.5 g/dL — ABNORMAL LOW (ref 13.0–18.0)
LYMPHS PCT: 35 %
Lymphs Abs: 0.8 10*3/uL — ABNORMAL LOW (ref 1.0–3.6)
MCH: 36.5 pg — ABNORMAL HIGH (ref 26.0–34.0)
MCHC: 33.8 g/dL (ref 32.0–36.0)
MCV: 108 fL — ABNORMAL HIGH (ref 80.0–100.0)
MONO ABS: 0.3 10*3/uL (ref 0.2–1.0)
Monocytes Relative: 12 %
Neutro Abs: 1.3 10*3/uL — ABNORMAL LOW (ref 1.4–6.5)
Neutrophils Relative %: 53 %
PLATELETS: 95 10*3/uL — AB (ref 150–440)
RBC: 2.87 MIL/uL — ABNORMAL LOW (ref 4.40–5.90)
RDW: 14.2 % (ref 11.5–14.5)
WBC: 2.4 10*3/uL — ABNORMAL LOW (ref 3.8–10.6)

## 2018-01-03 LAB — BASIC METABOLIC PANEL
ANION GAP: 5 (ref 5–15)
BUN: 20 mg/dL (ref 6–20)
CALCIUM: 8.7 mg/dL — AB (ref 8.9–10.3)
CO2: 28 mmol/L (ref 22–32)
CREATININE: 0.84 mg/dL (ref 0.61–1.24)
Chloride: 108 mmol/L (ref 101–111)
GFR calc Af Amer: >60 mL/min (ref >60)
GLUCOSE: 95 mg/dL (ref 65–99)
Potassium: 4.1 mmol/L (ref 3.5–5.1)
Sodium: 141 mmol/L (ref 135–145)

## 2018-01-03 NOTE — Progress Notes (Signed)
Pt in for follow up last seen 1 year ago. Pt's stepson brought patient today, he had been in multiple nursing homes but is now at home with family and caregivers.  Pt saw Dr Edilia Bo who requested patient resume follow up at clinic.

## 2018-01-04 LAB — IGG, IGA, IGM
IGA: 90 mg/dL (ref 61–437)
IGM (IMMUNOGLOBULIN M), SRM: 36 mg/dL (ref 15–143)
IgG (Immunoglobin G), Serum: 2290 mg/dL — ABNORMAL HIGH (ref 700–1600)

## 2018-01-04 LAB — KAPPA/LAMBDA LIGHT CHAINS
KAPPA, LAMDA LIGHT CHAIN RATIO: 3.13 — AB (ref 0.26–1.65)
Kappa free light chain: 37.2 mg/L — ABNORMAL HIGH (ref 3.3–19.4)
Lambda free light chains: 11.9 mg/L (ref 5.7–26.3)

## 2018-01-04 LAB — CEA: CEA1: 2.3 ng/mL (ref 0.0–4.7)

## 2018-01-05 LAB — PROTEIN ELECTROPHORESIS, SERUM
A/G RATIO SPE: 1 (ref 0.7–1.7)
ALBUMIN ELP: 3.5 g/dL (ref 2.9–4.4)
ALPHA-1-GLOBULIN: 0.2 g/dL (ref 0.0–0.4)
ALPHA-2-GLOBULIN: 0.5 g/dL (ref 0.4–1.0)
BETA GLOBULIN: 2.2 g/dL — AB (ref 0.7–1.3)
GAMMA GLOBULIN: 0.5 g/dL (ref 0.4–1.8)
Globulin, Total: 3.5 g/dL (ref 2.2–3.9)
M-Spike, %: 1.6 g/dL — ABNORMAL HIGH
Total Protein ELP: 7 g/dL (ref 6.0–8.5)

## 2018-01-06 ENCOUNTER — Telehealth: Payer: Self-pay | Admitting: Family Medicine

## 2018-01-06 MED ORDER — HYDROXYZINE HCL 10 MG PO TABS: 10.0000 mg | ORAL_TABLET | Freq: Every day | ORAL | 1 refills | Status: DC

## 2018-01-06 NOTE — Telephone Encounter (Signed)
done

## 2018-01-06 NOTE — Telephone Encounter (Signed)
Spoke with John Summers Pt needs prior approvial for  [employer] is almost out of med  Best number [phone]

## 2018-01-06 NOTE — Telephone Encounter (Signed)
Pt need prior approval for hydroxyzine

## 2018-01-06 NOTE — Telephone Encounter (Signed)
[employer] faxed fmla paperwork   I spoke with Daleen Snook she stated she needed intermittant FMLA To help pt when sitters cannot be there, take back and forth to appointments  She wanted to get this for 1 time month Lasting 2-5 days per episode  In dr copland's in box

## 2018-01-06 NOTE — Telephone Encounter (Signed)
Refills sent to [facility] on [address].

## 2018-01-07 NOTE — Telephone Encounter (Signed)
Paperwork faxed °

## 2018-01-10 ENCOUNTER — Encounter: Payer: Self-pay | Admitting: Oncology

## 2018-01-10 NOTE — Telephone Encounter (Signed)
Copy for scan Copy for pt 

## 2018-01-11 ENCOUNTER — Encounter: Payer: Self-pay | Admitting: Family Medicine

## 2018-01-12 ENCOUNTER — Other Ambulatory Visit: Payer: Self-pay | Admitting: Family Medicine

## 2018-01-12 MED ORDER — FLUOXETINE HCL 40 MG PO CAPS: 40.0000 mg | ORAL_CAPSULE | Freq: Every day | ORAL | 3 refills | Status: AC

## 2018-01-12 NOTE — Telephone Encounter (Signed)
Copied from [place] [id number]. Topic: Quick Communication - See Telephone Encounter >> Jan 12, 2018 12:24 PM Synthia Innocent wrote: CRM for notification. See Telephone encounter for: 01/12/18. Checking status of Mychart message

## 2018-01-19 ENCOUNTER — Telehealth: Payer: Self-pay

## 2018-01-19 DIAGNOSIS — R4182 Altered mental status, unspecified: Secondary | ICD-10-CM

## 2018-01-19 NOTE — Telephone Encounter (Signed)
Copied from [place] [id number]. Topic: Quick Communication - See Telephone Encounter >> Jan 19, 2018  2:44 PM Hewitt Shorts wrote: [name] called to say that yes she feels that a urine needs to be ordered and collected mood is getting worse   Best number [phone] [facility] to leave a message

## 2018-01-19 NOTE — Telephone Encounter (Signed)
John Penny, do you anything about this request. It is very open ended and vague.

## 2018-01-19 NOTE — Telephone Encounter (Signed)
Left message for John Summers to collect urine and bring to office for [place] test.  Future orders entered in Epic for Urinalysis, Microscopic and Culture per Dr. Lorelei Pont.

## 2018-01-19 NOTE — Telephone Encounter (Signed)
Can we get a UA, micro, and urine culture for him?  He is living at home now with his step-daughter. Call [place] - she will need to get a urine specimin and bring back to [place].

## 2018-01-20 ENCOUNTER — Other Ambulatory Visit (INDEPENDENT_AMBULATORY_CARE_PROVIDER_SITE_OTHER): Payer: Medicare Other

## 2018-01-20 DIAGNOSIS — R4182 Altered mental status, unspecified: Secondary | ICD-10-CM

## 2018-01-20 LAB — URINALYSIS
Bilirubin Urine: NEGATIVE
HGB URINE DIPSTICK: NEGATIVE
KETONES UR: NEGATIVE
LEUKOCYTES UA: NEGATIVE
Nitrite: NEGATIVE
Specific Gravity, Urine: 1.025 (ref 1.000–1.030)
Total Protein, Urine: NEGATIVE
URINE GLUCOSE: NEGATIVE
UROBILINOGEN UA: 0.2 (ref 0.0–1.0)
pH: 6 (ref 5.0–8.0)

## 2018-01-20 LAB — URINALYSIS, MICROSCOPIC ONLY: RBC / HPF: NONE SEEN (ref 0–?)

## 2018-01-21 LAB — URINE CULTURE
MICRO NUMBER:: 90567633
SPECIMEN QUALITY:: ADEQUATE

## 2018-01-29 ENCOUNTER — Emergency Department: Payer: Medicare Other

## 2018-01-29 ENCOUNTER — Emergency Department
Admission: EM | Admit: 2018-01-29 | Discharge: 2018-01-29 | Disposition: A | Discharge status: Home / Self Care | Payer: Medicare Other | Attending: Emergency Medicine | Admitting: Emergency Medicine

## 2018-01-29 ENCOUNTER — Encounter: Payer: Self-pay | Admitting: Emergency Medicine

## 2018-01-29 ENCOUNTER — Other Ambulatory Visit: Payer: Self-pay

## 2018-01-29 DIAGNOSIS — Z87891 Personal history of nicotine dependence: Secondary | ICD-10-CM | POA: Diagnosis not present

## 2018-01-29 DIAGNOSIS — D72819 Decreased white blood cell count, unspecified: Secondary | ICD-10-CM | POA: Insufficient documentation

## 2018-01-29 DIAGNOSIS — Z79899 Other long term (current) drug therapy: Secondary | ICD-10-CM | POA: Insufficient documentation

## 2018-01-29 DIAGNOSIS — F0390 Unspecified dementia without behavioral disturbance: Secondary | ICD-10-CM | POA: Insufficient documentation

## 2018-01-29 DIAGNOSIS — R0789 Other chest pain: Secondary | ICD-10-CM

## 2018-01-29 DIAGNOSIS — I10 Essential (primary) hypertension: Secondary | ICD-10-CM | POA: Insufficient documentation

## 2018-01-29 DIAGNOSIS — Z96642 Presence of left artificial hip joint: Secondary | ICD-10-CM | POA: Insufficient documentation

## 2018-01-29 LAB — DIFFERENTIAL
BASOS ABS: 0 10*3/uL (ref 0–0.1)
BASOS PCT: 0 %
BLASTS: 1 %
Band Neutrophils: 0 %
Eosinophils Absolute: 0 10*3/uL (ref 0–0.7)
Eosinophils Relative: 0 %
LYMPHS ABS: 0.7 10*3/uL — AB (ref 1.0–3.6)
LYMPHS PCT: 43 %
MONO ABS: 0.2 10*3/uL (ref 0.2–1.0)
MYELOCYTES: 0 %
Metamyelocytes Relative: 0 %
Monocytes Relative: 14 %
NEUTROS ABS: 0.7 10*3/uL — AB (ref 1.4–6.5)
Neutrophils Relative %: 42 %
Other: 0 %
PROMYELOCYTES RELATIVE: 0 %
nRBC: 0 /100 WBC

## 2018-01-29 LAB — COMPREHENSIVE METABOLIC PANEL
ALK PHOS: 67 U/L (ref 38–126)
ALT: 11 U/L — ABNORMAL LOW (ref 17–63)
ANION GAP: 8 (ref 5–15)
AST: 14 U/L — ABNORMAL LOW (ref 15–41)
Albumin: 3.8 g/dL (ref 3.5–5.0)
BILIRUBIN TOTAL: 0.9 mg/dL (ref 0.3–1.2)
BUN: 14 mg/dL (ref 6–20)
CO2: 26 mmol/L (ref 22–32)
CREATININE: 0.76 mg/dL (ref 0.61–1.24)
Calcium: 8.5 mg/dL — ABNORMAL LOW (ref 8.9–10.3)
Chloride: 108 mmol/L (ref 101–111)
GFR calc Af Amer: >60 mL/min (ref >60)
GFR calc non Af Amer: >60 mL/min (ref >60)
GLUCOSE: 95 mg/dL (ref 65–99)
POTASSIUM: 4 mmol/L (ref 3.5–5.1)
Sodium: 142 mmol/L (ref 135–145)
Total Protein: 7.4 g/dL (ref 6.5–8.1)

## 2018-01-29 LAB — CBC
HEMATOCRIT: 31.2 % — AB (ref 40.0–52.0)
Hemoglobin: 10.6 g/dL — ABNORMAL LOW (ref 13.0–18.0)
MCH: 36.1 pg — ABNORMAL HIGH (ref 26.0–34.0)
MCHC: 33.9 g/dL (ref 32.0–36.0)
MCV: 106.5 fL — AB (ref 80.0–100.0)
PLATELETS: 81 10*3/uL — AB (ref 150–440)
RBC: 2.93 MIL/uL — ABNORMAL LOW (ref 4.40–5.90)
RDW: 14.1 % (ref 11.5–14.5)
WBC: 1.7 10*3/uL — ABNORMAL LOW (ref 3.8–10.6)

## 2018-01-29 LAB — TROPONIN I: Troponin I: <0.03 ng/mL (ref <0.03)

## 2018-01-29 NOTE — ED Provider Notes (Signed)
[facility] Emergency Department Provider Note  ____________________________________________  Time seen: Approximately 11:43 AM  I have reviewed the triage vital signs and the nursing notes.   HISTORY  Chief Complaint Chest Pain  Level 5 Caveat: Portions of the History and Physical were unable to be obtained due to altered mental status.   HPI TRAVARIUS LANGE is a 84 y.o. male with a history of chronic dementia, hypertension, gallstones who is brought to the ED by EMS due to reporting chest pain today. Patient is not able to provide any history due to his dementia, but does indicate that he has no pain currently. Earlier EMS report that the patient had pointed at his chest to his daughter to alert her of his symptoms. Daughter at bedside does report that the patient has had multiple falls recently. Was found to have rib fractures recently.      Past Medical History:  Diagnosis Date  . Colon adenocarcinoma ([place])   . Complication of anesthesia    severe confusion  . GERD (gastroesophageal reflux disease)   . History of colon polyps   . History of gallstones   . Hx of transfusion of platelets   . Hypertension   . ITP (idiopathic thrombocytopenic purpura)    Dr. Grayland Ormond follows  . Kidney stones   . Lewy body dementia with behavioral disturbance   . Major depressive disorder, recurrent episode, in partial remission with anxious distress ([place])   . Pancreatitis 2/11  . Pollen allergies   . Ribs, multiple fractures   . Skin cancer    ears  . Sleep apnea    "won't wear mask anymore" (12/22/2012)  . Unspecified essential hypertension      Patient Active Problem List   Diagnosis Date Noted  . Malnutrition of moderate degree 11/03/2017  . Rib fractures 10/30/2017  . 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 Pancytopenia, acquired ([place]) 08/02/2017  . Visual hallucination 08/02/2017  . Generalized osteoarthritis 03/05/2017  . AF (paroxysmal  atrial fibrillation) ([place]) 02/01/2017  . MGUS (monoclonal gammopathy of unknown significance) 05/10/2016  . Pancreatic mass 04/24/2016  . Thrombocytopenia ([place]) 10/04/2015  . Lewy body dementia with behavioral disturbance 11/16/2014  . Tremor of right hand 11/16/2014  . Major depressive disorder, recurrent episode, in partial remission with anxious distress ([place])   . REM sleep behavior disorder 12/07/2013  . Incisional hernia, without obstruction or gangrene 11/14/2012  . Adenocarcinoma of cecum ([place]) 03/19/2012  . GERD (gastroesophageal reflux disease) 03/19/2012  . Sleep apnea   . Essential hypertension      Past Surgical History:  Procedure Laterality Date  . CARDIAC CATHETERIZATION  2000's   "tx'd w/acid reflux RX" (12/22/2012)  . CHOLECYSTECTOMY  2011  . ESOPHAGEAL DILATION  1990's?  Marland Kitchen HERNIA REPAIR  12/22/2012   IHR w/mesh  . INGUINAL HERNIA REPAIR Left [id number]  . INSERTION OF MESH N/A 12/22/2012   Procedure: INSERTION OF MESH;  Surgeon: Harl Bowie, MD;  Location: [place];  Service: General;  Laterality: N/A;  . LITHOTRIPSY  2008   x2  . PARTIAL COLECTOMY  03/21/2012   Procedure: PARTIAL COLECTOMY;  Surgeon: Harl Bowie, MD;  Location: [place];  Service: General;  Laterality: Right;  . PILONIDAL CYST EXCISION  1960's?  Marland Kitchen SKIN CANCER EXCISION     "face; ?left ear" (12/22/2012)  . TONSILLECTOMY AND ADENOIDECTOMY  1946  . TOTAL HIP ARTHROPLASTY Left ~ 2002  . UPPER GASTROINTESTINAL ENDOSCOPY    . VENTRAL HERNIA REPAIR  N/A 12/22/2012   Procedure: HERNIA REPAIR INCISIONAL ;  Surgeon: Harl Bowie, MD;  Location: [place];  Service: General;  Laterality: N/A;     Prior to Admission medications   Medication Sig Start Date End Date Taking? Authorizing Provider  acetaminophen (TYLENOL) 500 MG tablet Take 500 mg by mouth 2 (two) times daily.    [provider]  donepezil (ARICEPT) 10 MG tablet take 1 tablet by mouth once daily 11/09/16   Dohmeier, Asencion Partridge, MD   Elastic Bandages & Supports (MEDICAL COMPRESSION SOCKS) MISC by Does not apply route.    [provider]  esomeprazole (NEXIUM) 40 MG capsule Take 40 mg by mouth daily at 12 noon.    [provider]  FLUoxetine (PROZAC) 40 MG capsule Take 1 capsule (40 mg total) by mouth daily. 01/12/18   Copland, Frederico Hamman, MD  hydrOXYzine (ATARAX/VISTARIL) 10 MG tablet Take 1 tablet (10 mg total) by mouth at bedtime. 01/06/18   Copland, Frederico Hamman, MD  Melatonin 5 MG TABS Take 1 tablet (5 mg total) by mouth at bedtime. 04/19/17   Dohmeier, Asencion Partridge, MD  QUEtiapine (SEROQUEL) 25 MG tablet Take 1 tablet (25 mg total) by mouth at bedtime. 08/30/17   Dohmeier, Asencion Partridge, MD  traMADol (ULTRAM) 50 MG tablet Take 1 tablet (50 mg total) by mouth every 6 (six) hours as needed for moderate pain. 11/04/17   Rayburn, Floyce Stakes, PA-C     Allergies Aspirin; Chicken allergy; Nsaids; and Other   Family History  Problem Relation Age of Onset  . Colon cancer Mother   . Heart disease Mother   . Hypertension Mother   . Alcohol abuse Father   . Esophageal cancer Neg Hx   . Rectal cancer Neg Hx   . Stomach cancer Neg Hx     Social History Social History   Tobacco Use  . Smoking status: Former Smoker    Types: Pipe  . Smokeless tobacco: Never Used  . Tobacco comment: i4/06/2013 "only smoked pipe n college 1958"  Substance Use Topics  . Alcohol use: No    Alcohol/week: 0.0 oz  . Drug use: No    Review of Systems  Level 5 Caveat: Portions of the History and Physical were unable to be obtained due to altered mental status. Constitutional:   No fever .   Cardiovascular:  positive chest pain without syncope.  ____________________________________________   PHYSICAL EXAM:  VITAL SIGNS: ED Triage Vitals  Enc Vitals Group     BP 01/29/18 1051 (!) 188/97     Pulse Rate 01/29/18 1051 69     Resp 01/29/18 1051 20     Temp 01/29/18 1051 97.9 F (36.6 C)     Temp src --      SpO2 01/29/18 1051 97 %      Weight 01/29/18 1053 170 lb (77.1 kg)     Height 01/29/18 1053 5\' 10"  (1.778 m)     Head Circumference --      Peak Flow --      Pain Score 01/29/18 1053 0     Pain Loc --      Pain Edu? --      Excl. in [place]? --     Vital signs reviewed, nursing assessments reviewed.   Constitutional:   Alert and oriented to self. Well appearing and in no distress. Eyes:   Conjunctivae are normal. EOMI. PERRL. ENT      Head:   Normocephalic and atraumatic.  Nose:   No congestion/rhinnorhea.       Mouth/Throat:   MMM, no pharyngeal erythema. No peritonsillar mass.       Neck:   No meningismus. Full ROM. Hematological/Lymphatic/Immunilogical:   No cervical lymphadenopathy. Cardiovascular:   RRR. Symmetric bilateral radial and DP pulses.  No murmurs.  Respiratory:   Normal respiratory effort without tachypnea/retractions. Breath sounds are clear and equal bilaterally. No wheezes/rales/rhonchi. Gastrointestinal:   Soft and nontender. Non distended. There is no CVA tenderness.  No rebound, rigidity, or guarding.  Musculoskeletal:   Normal range of motion in all extremities. No joint effusions.  No lower extremity tenderness.  No edema.chest wall tender over the right anterior chest reproducing his symptoms. No crepitus or deformity. Neurologic:   Normal speech , limited language.  Motor grossly intact. No acute focal neurologic deficits are appreciated.  Skin:    Skin is warm, dry and intact. No rash noted.  No petechiae, purpura, or bullae.  ____________________________________________    LABS (pertinent positives/negatives) (all labs ordered are listed, but only abnormal results are displayed) Labs Reviewed  CBC - Abnormal; Notable for the following components:      Result Value   WBC 1.7 (*)    RBC 2.93 (*)    Hemoglobin 10.6 (*)    HCT 31.2 (*)    MCV 106.5 (*)    MCH 36.1 (*)    Platelets 81 (*)    All other components within normal limits  COMPREHENSIVE METABOLIC PANEL - Abnormal;  Notable for the following components:   Calcium 8.5 (*)    AST 14 (*)    ALT 11 (*)    All other components within normal limits  DIFFERENTIAL - Abnormal; Notable for the following components:   Neutro Abs 0.7 (*)    Lymphs Abs 0.7 (*)    All other components within normal limits  TROPONIN I  TROPONIN I  CBC WITH DIFFERENTIAL/PLATELET   ____________________________________________   EKG  interpreted by me Sinus rhythm rate of 66, left axis, normal intervals. Poor R-wave progression in anterior precordial leads. Normal ST segments , inferior T wave inversions that are unchanged from April 2019. Nonischemic in appearance..  ____________________________________________    RADIOLOGY  Dg Chest Portable 1 View  Result Date: 01/29/2018 CLINICAL DATA:  84 year old male with a history chest pain EXAM: PORTABLE CHEST 1 VIEW COMPARISON:  12/12/2017, chest CT 10/30/2017 FINDINGS: Cardiomediastinal silhouette unchanged in size and contour. No evidence of central vascular congestion. No pneumothorax. No large pleural effusion. Similar appearance of asymmetric elevation of the right hemidiaphragm. Multiple fractures, previously diagnosed on prior chest CT imaging, with early callus formation. IMPRESSION: Similar appearance of chest x-ray with low lung volumes and chronic lung changes, with no evidence of lobar pneumonia. Left-sided rib fractures, present on prior CT imaging Electronically Signed   By: Corrie Mckusick D.O.   On: 01/29/2018 11:20    ____________________________________________   PROCEDURES Procedures  ____________________________________________  DIFFERENTIAL DIAGNOSIS   rib fracture, chest wall contusion, chest wall pain, non-STEMI  CLINICAL IMPRESSION / ASSESSMENT AND PLAN / ED COURSE  Pertinent labs & imaging results that were available during my care of the patient were reviewed by me and considered in my medical decision making (see chart for details).    patient  well-appearing no acute distress, presents with vague chest pain that is unable to describe. Chest wall very tender to the touch and seems to be the cause of his pain. Low suspicion for ACS PE dissection  or AAA pneumothorax or pericarditis. EKG is unchanged and nondiagnostic. We'll check 2 troponins chest x-ray and other labs, if negative patient be suitable for outpatient follow-up.       ----------------------------------------- 3:20 PM on 01/29/2018 -----------------------------------------  Do troponins negative, chest x-ray nonacute, labs otherwise unremarkable. Results discussed with the daughter at bedside who takes care of the patient. She will follow up with Dr. Grayland Ormond regarding the leukopenia. Otherwise no additional concerns at this time. Stable for discharge home. Patient is sitting upright in a chair, eating, feeling fine. ____________________________________________   FINAL CLINICAL IMPRESSION(S) / ED DIAGNOSES    Final diagnoses:  Chest wall pain  Dementia without behavioral disturbance, unspecified dementia type  Leukopenia, unspecified type     ED Discharge Orders    None      Portions of this note were generated with dragon dictation software. Dictation errors may occur despite best attempts at proofreading.    Carrie Mew, MD 01/29/18 1520

## 2018-01-29 NOTE — ED Triage Notes (Signed)
Per report complained of chest pain this am. Denies pain now.history of dementia lives with daughter per report. Patient speaks clearly but unable to give history. Chest pain with palpation.

## 2018-02-04 ENCOUNTER — Encounter: Payer: Self-pay | Admitting: Neurology

## 2018-02-04 ENCOUNTER — Telehealth: Payer: Self-pay | Admitting: Family Medicine

## 2018-02-04 ENCOUNTER — Encounter: Payer: Self-pay | Admitting: Oncology

## 2018-02-04 MED ORDER — QUETIAPINE FUMARATE 25 MG PO TABS: 25.0000 mg | ORAL_TABLET | Freq: Every day | ORAL | 1 refills | Status: DC

## 2018-02-04 MED ORDER — DONEPEZIL HCL 10 MG PO TABS: 10.0000 mg | ORAL_TABLET | Freq: Every day | ORAL | 3 refills | Status: DC

## 2018-02-04 NOTE — Telephone Encounter (Signed)
Refill for donepezil 10 mg tab that has expired on 11/09/17   And refill for Quetiapine 25 mg tab. LR was 08/30/17 for #30 tabs and refills  LOV  12/22/17, meds not addressed (seen for pneumonia) Visit before this one was 0622/18  [facility] none  Ph[facility] [phone]  Dr. Lorelei Pont

## 2018-02-04 NOTE — Telephone Encounter (Signed)
Refills sent to [facility] on [address].

## 2018-02-04 NOTE — Telephone Encounter (Signed)
Copied from [place] [phone]. Topic: Quick Communication - Rx Refill/Question >> Feb 04, 2018  2:36 PM Robina Ade, Helene Kelp D wrote: Medication: donepezil (ARICEPT) 10 MG tablet, QUEtiapine (SEROQUEL) 25 MG tablet  Has the patient contacted their pharmacy? Yes, patient is completely out and pharmacy send to wrong office and patient needs it. (Agent: If no, request that the patient contact the pharmacy for the refill.) (Agent: If yes, when and what did the pharmacy advise?)  Preferred Pharmacy (with phone number or street name): [id number] [address], [place]: Please be advised that RX refills may take up to 3 business days. We ask that you follow-up with your pharmacy.

## 2018-03-05 IMAGING — CR DG CHEST 2V
1 series · 2 of 2 positions shown · non-contrast
Comparison: 12/08/2012, 11/14/2016

CLINICAL DATA: 84-year-old male with a history of syncope.

EXAM:
CHEST  2 VIEW

[Series 1: dg chest 2 view · 0.14mm/px · 2 of 2 slices shown]
[im 1/2]
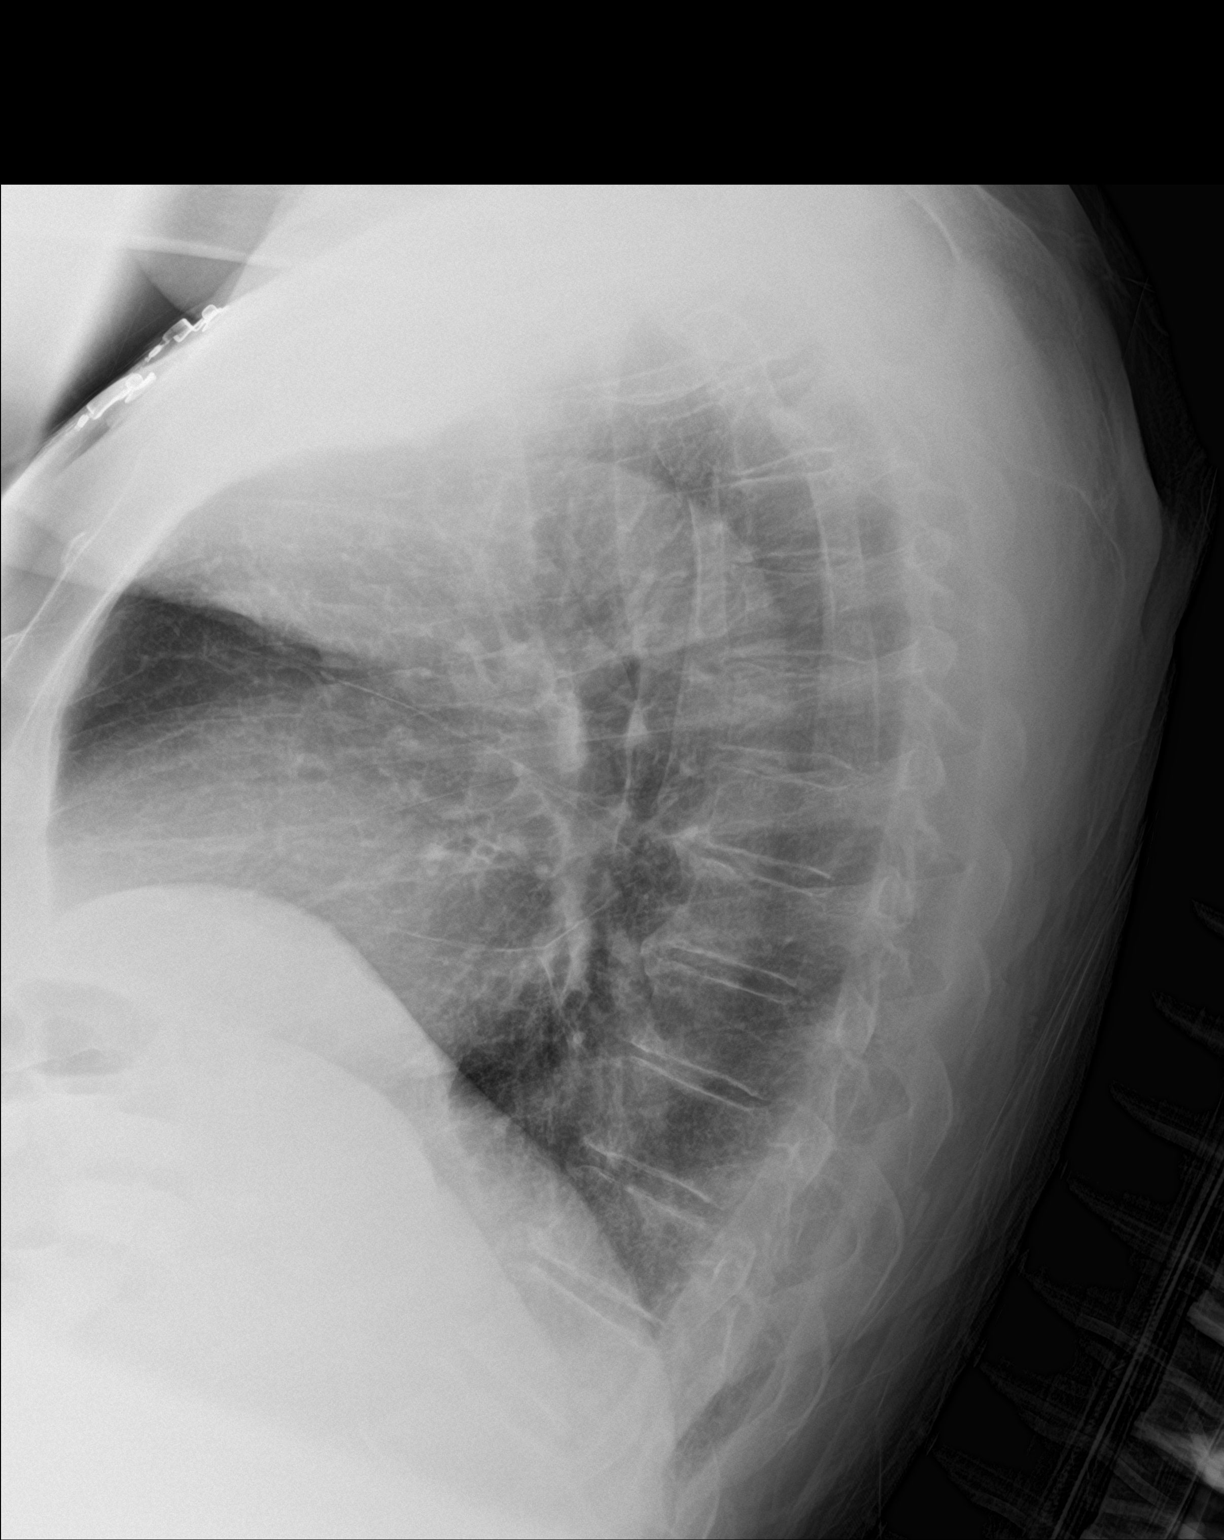
[im 2/2]
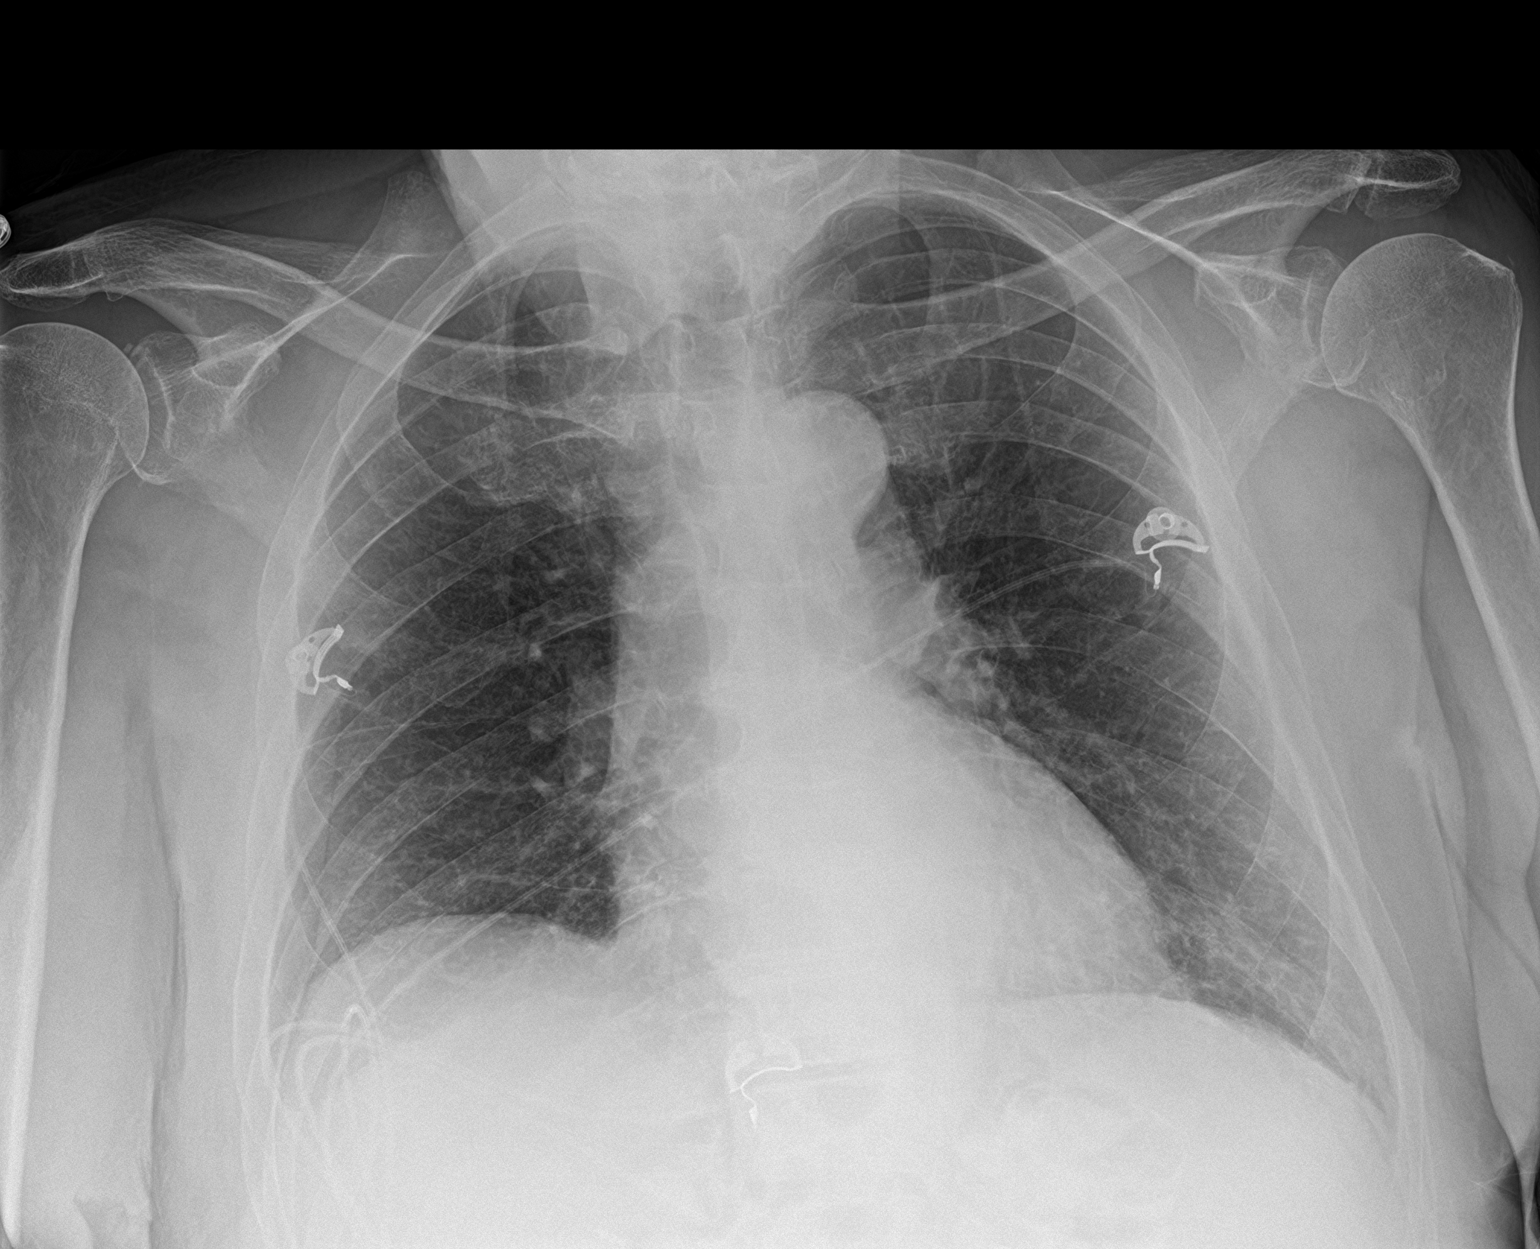

[2 of 2 positions shown; findings below may reference images not displayed]

FINDINGS: Cardiomediastinal silhouette unchanged. No evidence of central
vascular congestion.

Calcifications of the aortic arch.

Low lung volumes persist with interstitial opacities with
nonspecific pattern. No pleural effusion or pneumothorax. No
confluent airspace disease.
IMPRESSION: Low lung volumes with likely chronic changes and no evidence of
acute cardiopulmonary disease.

Aortic atherosclerosis.

## 2018-03-23 ENCOUNTER — Emergency Department
Admission: EM | Admit: 2018-03-23 | Discharge: 2018-03-23 | Disposition: A | Discharge status: Home / Self Care | Payer: Medicare Other | Attending: Emergency Medicine | Admitting: Emergency Medicine

## 2018-03-23 ENCOUNTER — Telehealth: Payer: Self-pay | Admitting: Adult Health

## 2018-03-23 ENCOUNTER — Other Ambulatory Visit: Payer: Self-pay | Admitting: Neurology

## 2018-03-23 ENCOUNTER — Encounter: Payer: Self-pay | Admitting: Neurology

## 2018-03-23 ENCOUNTER — Other Ambulatory Visit: Payer: Self-pay

## 2018-03-23 ENCOUNTER — Emergency Department: Payer: Medicare Other

## 2018-03-23 DIAGNOSIS — Z87891 Personal history of nicotine dependence: Secondary | ICD-10-CM | POA: Diagnosis not present

## 2018-03-23 DIAGNOSIS — Y939 Activity, unspecified: Secondary | ICD-10-CM | POA: Diagnosis not present

## 2018-03-23 DIAGNOSIS — Z85038 Personal history of other malignant neoplasm of large intestine: Secondary | ICD-10-CM | POA: Insufficient documentation

## 2018-03-23 DIAGNOSIS — S0990XA Unspecified injury of head, initial encounter: Secondary | ICD-10-CM | POA: Insufficient documentation

## 2018-03-23 DIAGNOSIS — I10 Essential (primary) hypertension: Secondary | ICD-10-CM | POA: Insufficient documentation

## 2018-03-23 DIAGNOSIS — S060X0A Concussion without loss of consciousness, initial encounter: Secondary | ICD-10-CM | POA: Insufficient documentation

## 2018-03-23 DIAGNOSIS — W010XXA Fall on same level from slipping, tripping and stumbling without subsequent striking against object, initial encounter: Secondary | ICD-10-CM | POA: Insufficient documentation

## 2018-03-23 DIAGNOSIS — Y929 Unspecified place or not applicable: Secondary | ICD-10-CM | POA: Insufficient documentation

## 2018-03-23 DIAGNOSIS — Y999 Unspecified external cause status: Secondary | ICD-10-CM | POA: Diagnosis not present

## 2018-03-23 DIAGNOSIS — Z96642 Presence of left artificial hip joint: Secondary | ICD-10-CM | POA: Insufficient documentation

## 2018-03-23 DIAGNOSIS — G3183 Dementia with Lewy bodies: Secondary | ICD-10-CM | POA: Insufficient documentation

## 2018-03-23 DIAGNOSIS — Z85828 Personal history of other malignant neoplasm of skin: Secondary | ICD-10-CM | POA: Diagnosis not present

## 2018-03-23 DIAGNOSIS — Z79899 Other long term (current) drug therapy: Secondary | ICD-10-CM | POA: Insufficient documentation

## 2018-03-23 MED ORDER — QUETIAPINE FUMARATE 50 MG PO TABS: 50.0000 mg | ORAL_TABLET | Freq: Every day | ORAL | 1 refills | Status: AC

## 2018-03-23 NOTE — Discharge Instructions (Signed)
It was a pleasure to take care of you today, and thank you for coming to our emergency department.  If you have any questions or concerns before leaving please ask the nurse to grab me and I'm more than happy to go through your aftercare instructions again.  If you were prescribed any opioid pain medication today such as Norco, Vicodin, Percocet, morphine, hydrocodone, or oxycodone please make sure you do not drive when you are taking this medication as it can alter your ability to drive safely.  If you have any concerns once you are home that you are not improving or are in fact getting worse before you can make it to your follow-up appointment, please do not hesitate to call 911 and come back for further evaluation.  Darel Hong, MD  Results for orders placed or performed during the hospital encounter of 01/29/18  CBC  Result Value Ref Range   WBC 1.7 (L) 3.8 - 10.6 K/uL   RBC 2.93 (L) 4.40 - 5.90 MIL/uL   Hemoglobin 10.6 (L) 13.0 - 18.0 g/dL   HCT 31.2 (L) 40.0 - 52.0 %   MCV 106.5 (H) 80.0 - 100.0 fL   MCH 36.1 (H) 26.0 - 34.0 pg   MCHC 33.9 32.0 - 36.0 g/dL   RDW 14.1 11.5 - 14.5 %   Platelets 81 (L) 150 - 440 K/uL  Troponin I  Result Value Ref Range   Troponin I <0.03 <0.03 ng/mL  Comprehensive metabolic panel  Result Value Ref Range   Sodium 142 135 - 145 mmol/L   Potassium 4.0 3.5 - 5.1 mmol/L   Chloride 108 101 - 111 mmol/L   CO2 26 22 - 32 mmol/L   Glucose, Bld 95 65 - 99 mg/dL   BUN 14 6 - 20 mg/dL   Creatinine, Ser 0.76 0.61 - 1.24 mg/dL   Calcium 8.5 (L) 8.9 - 10.3 mg/dL   Total Protein 7.4 6.5 - 8.1 g/dL   Albumin 3.8 3.5 - 5.0 g/dL   AST 14 (L) 15 - 41 U/L   ALT 11 (L) 17 - 63 U/L   Alkaline Phosphatase 67 38 - 126 U/L   Total Bilirubin 0.9 0.3 - 1.2 mg/dL   GFR calc non Af Amer >60 >60 mL/min   GFR calc Af Amer >60 >60 mL/min   Anion gap 8 5 - 15  Differential  Result Value Ref Range   Neutrophils Relative % 42 %   Lymphocytes Relative 43 %   Monocytes  Relative 14 %   Eosinophils Relative 0 %   Basophils Relative 0 %   Band Neutrophils 0 %   Metamyelocytes Relative 0 %   Myelocytes 0 %   Promyelocytes Relative 0 %   Blasts 1 %   nRBC 0 0 /100 WBC   Other 0 %   Neutro Abs 0.7 (L) 1.4 - 6.5 K/uL   Lymphs Abs 0.7 (L) 1.0 - 3.6 K/uL   Monocytes Absolute 0.2 0.2 - 1.0 K/uL   Eosinophils Absolute 0.0 0 - 0.7 K/uL   Basophils Absolute 0.0 0 - 0.1 K/uL  Troponin I  Result Value Ref Range   Troponin I <0.03 <0.03 ng/mL   Ct Head Wo Contrast  Result Date: 03/23/2018 CLINICAL DATA:  Posttraumatic headache after fall yesterday. EXAM: CT HEAD WITHOUT CONTRAST TECHNIQUE: Contiguous axial images were obtained from the base of the skull through the vertex without intravenous contrast. COMPARISON:  CT scan of October 30, 2017. FINDINGS: Brain: Mild diffuse  cortical atrophy is noted. Mild chronic ischemic white matter disease is noted. Calcification of bilateral basal ganglia is noted. No mass effect or midline shift is noted. Ventricular size is within normal limits. There is no evidence of mass lesion, hemorrhage or acute infarction. Vascular: No hyperdense vessel or unexpected calcification. Skull: Normal. Negative for fracture or focal lesion. Sinuses/Orbits: No acute finding. Other: None. IMPRESSION: Mild diffuse cortical atrophy. Mild chronic ischemic white matter disease. No acute intracranial abnormality seen. Electronically Signed   By: Marijo Conception, M.D.   On: 03/23/2018 20:05

## 2018-03-23 NOTE — Telephone Encounter (Signed)
Spoke with daughter, [name], [place] and informed her a refill x 6 months has been sent, Seroquel 50 mg tabs. She verbalized understanding, appreciation.

## 2018-03-23 NOTE — Telephone Encounter (Signed)
Pt daughter has called for a refill on pt'sQUEtiapine (SEROQUEL) 25 MG tablet.  She states there has been an increase from 25 mg- 50mg .  Pt daughter is asking [facility]  [id number] [address], [place] [phone] (Phone) [phone] (Fax)

## 2018-03-23 NOTE — ED Provider Notes (Signed)
[facility] Emergency Department Provider Note  ____________________________________________   First MD Initiated Contact with Patient 03/23/18 2059     (approximate)  I have reviewed the triage vital signs and the nursing notes.   HISTORY  Chief Complaint Fall and Head Injury   HPI John Summers is a 84 y.o. male who comes to the emergency department family after a mechanical fall yesterday.  He tripped and fell landing on the front of his head.  He did not lose consciousness.  He has had no nausea or vomiting.  No double vision or blurred vision.  He did have sudden onset mild to moderate throbbing aching bifrontal headache which has persisted.  No neck pain.  No numbness or weakness.  Family became concerned because the patient has a history of dementia and they wanted him evaluated.  Tetanus is up-to-date.  Given Tylenol with some improvement in his symptoms.    Past Medical History:  Diagnosis Date  . Colon adenocarcinoma ([place])   . Complication of anesthesia    severe confusion  . GERD (gastroesophageal reflux disease)   . History of colon polyps   . History of gallstones   . Hx of transfusion of platelets   . Hypertension   . ITP (idiopathic thrombocytopenic purpura)    Dr. Grayland Ormond follows  . Kidney stones   . Lewy body dementia with behavioral disturbance   . Major depressive disorder, recurrent episode, in partial remission with anxious distress ([place])   . Pancreatitis 2/11  . Pollen allergies   . Ribs, multiple fractures   . Skin cancer    ears  . Sleep apnea    "won't wear mask anymore" (12/22/2012)  . Unspecified essential hypertension     Patient Active Problem List   Diagnosis Date Noted  . Malnutrition of moderate degree 11/03/2017  . Rib fractures 10/30/2017  . 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 Pancytopenia, acquired ([place]) 08/02/2017  . Visual hallucination 08/02/2017  . Generalized osteoarthritis  03/05/2017  . AF (paroxysmal atrial fibrillation) ([place]) 02/01/2017  . MGUS (monoclonal gammopathy of unknown significance) 05/10/2016  . Pancreatic mass 04/24/2016  . Thrombocytopenia ([place]) 10/04/2015  . Lewy body dementia with behavioral disturbance 11/16/2014  . Tremor of right hand 11/16/2014  . Major depressive disorder, recurrent episode, in partial remission with anxious distress ([place])   . REM sleep behavior disorder 12/07/2013  . Incisional hernia, without obstruction or gangrene 11/14/2012  . Adenocarcinoma of cecum ([place]) 03/19/2012  . GERD (gastroesophageal reflux disease) 03/19/2012  . Sleep apnea   . Essential hypertension     Past Surgical History:  Procedure Laterality Date  . CARDIAC CATHETERIZATION  2000's   "tx'd w/acid reflux RX" (12/22/2012)  . CHOLECYSTECTOMY  2011  . ESOPHAGEAL DILATION  1990's?  Marland Kitchen HERNIA REPAIR  12/22/2012   IHR w/mesh  . INGUINAL HERNIA REPAIR Left [id number]  . INSERTION OF MESH N/A 12/22/2012   Procedure: INSERTION OF MESH;  Surgeon: Harl Bowie, MD;  Location: [place];  Service: General;  Laterality: N/A;  . LITHOTRIPSY  2008   x2  . PARTIAL COLECTOMY  03/21/2012   Procedure: PARTIAL COLECTOMY;  Surgeon: Harl Bowie, MD;  Location: [place];  Service: General;  Laterality: Right;  . PILONIDAL CYST EXCISION  1960's?  Marland Kitchen SKIN CANCER EXCISION     "face; ?left ear" (12/22/2012)  . TONSILLECTOMY AND ADENOIDECTOMY  1946  . TOTAL HIP ARTHROPLASTY Left ~ 2002  . UPPER GASTROINTESTINAL ENDOSCOPY    .  VENTRAL HERNIA REPAIR N/A 12/22/2012   Procedure: HERNIA REPAIR INCISIONAL ;  Surgeon: Harl Bowie, MD;  Location: [place];  Service: General;  Laterality: N/A;    Prior to Admission medications   Medication Sig Start Date End Date Taking? Authorizing Provider  acetaminophen (TYLENOL) 500 MG tablet Take 500 mg by mouth 2 (two) times daily.    [provider]  donepezil (ARICEPT) 10 MG tablet Take 1 tablet (10 mg total) by mouth  daily. 02/04/18   Owens Loffler, MD  Elastic Bandages & Supports (MEDICAL COMPRESSION SOCKS) MISC by Does not apply route.    [provider]  esomeprazole (NEXIUM) 40 MG capsule Take 40 mg by mouth daily at 12 noon.    [provider]  FLUoxetine (PROZAC) 40 MG capsule Take 1 capsule (40 mg total) by mouth daily. 01/12/18   Copland, Frederico Hamman, MD  hydrOXYzine (ATARAX/VISTARIL) 10 MG tablet Take 1 tablet (10 mg total) by mouth at bedtime. 01/06/18   Copland, Frederico Hamman, MD  Melatonin 5 MG TABS Take 1 tablet (5 mg total) by mouth at bedtime. 04/19/17   Dohmeier, Asencion Partridge, MD  QUEtiapine (SEROQUEL) 50 MG tablet Take 1 tablet (50 mg total) by mouth at bedtime. 03/23/18   Ward Givens, NP  traMADol (ULTRAM) 50 MG tablet Take 1 tablet (50 mg total) by mouth every 6 (six) hours as needed for moderate pain. 11/04/17   Rayburn, Floyce Stakes, PA-C    Allergies Aspirin; Chicken allergy; Nsaids; and Other  Family History  Problem Relation Age of Onset  . Colon cancer Mother   . Heart disease Mother   . Hypertension Mother   . Alcohol abuse Father   . Esophageal cancer Neg Hx   . Rectal cancer Neg Hx   . Stomach cancer Neg Hx     Social History Social History   Tobacco Use  . Smoking status: Former Smoker    Types: Pipe  . Smokeless tobacco: Never Used  . Tobacco comment: i4/06/2013 "only smoked pipe n college 1958"  Substance Use Topics  . Alcohol use: No    Alcohol/week: 0.0 oz  . Drug use: No    Review of Systems Constitutional: No fever/chills Eyes: No visual changes. ENT: No sore throat. Cardiovascular: Denies chest pain. Respiratory: Denies shortness of breath. Gastrointestinal: No abdominal pain.  No nausea, no vomiting.  No diarrhea.  No constipation. Genitourinary: Negative for dysuria. Musculoskeletal: Negative for back pain. Skin: Positive for abrasion Neurological: Positive for headache  ____________________________________________   PHYSICAL EXAM:  VITAL  SIGNS: ED Triage Vitals  Enc Vitals Group     BP 03/23/18 1946 134/72     Pulse Rate 03/23/18 1946 72     Resp 03/23/18 1946 18     Temp 03/23/18 1946 98.4 F (36.9 C)     Temp Source 03/23/18 1946 Oral     SpO2 03/23/18 1946 97 %     Weight 03/23/18 1947 167 lb (75.8 kg)     Height 03/23/18 1947 5\' 10"  (1.778 m)     Head Circumference --      Peak Flow --      Pain Score 03/23/18 1947 7     Pain Loc --      Pain Edu? --      Excl. in [place]? --     Constitutional: Pleasant cooperative demented  Eyes: PERRL EOMI. Head: Abrasion to forehead. Nose: No congestion/rhinnorhea. Mouth/Throat: No trismus Neck: No stridor. Tenderness no step-offs Cardiovascular: Normal rate, regular  rhythm. Grossly normal heart sounds.  Good peripheral circulation. Respiratory: Normal respiratory effort.  No retractions. Lungs CTAB and moving good air Gastrointestinal: Soft nontender Musculoskeletal: No lower extremity edema   Neurologic:  Normal speech and language. No gross focal neurologic deficits are appreciated. Skin:  Skin is warm, dry and intact. No rash noted. Psychiatric: [place] dimension    ____________________________________________   DIFFERENTIAL includes but not limited to  Range, cervical spine fracture, concussion ____________________________________________   LABS (all labs ordered are listed, but only abnormal results are displayed)  Labs Reviewed - No data to display   __________________________________________  EKG   ____________________________________________  RADIOLOGY  T scan of the head reviewed by me with chronic changes but no acute disease ____________________________________________   PROCEDURES  Procedure(s) performed: no  Procedures  Critical Care performed: no  ____________________________________________   INITIAL IMPRESSION / ASSESSMENT AND PLAN / ED COURSE  Pertinent labs & imaging results that were available during my care of the  patient were reviewed by me and considered in my medical decision making (see chart for details).   The patient is at his baseline according to family with no focal neuro deficits.  CT scan obtained given his age and is fortunately negative.  Tetanus is up-to-date.  Likely has a mild concussion.  Will refer back to primary care.  Family verbalizes understanding and agreeable with plan.      ____________________________________________   FINAL CLINICAL IMPRESSION(S) / ED DIAGNOSES  Final diagnoses:  Injury of head, initial encounter  Concussion without loss of consciousness, initial encounter      NEW MEDICATIONS STARTED DURING THIS VISIT:  Discharge Medication List as of 03/23/2018  9:05 PM       Note:  This document was prepared using Dragon voice recognition software and may include unintentional dictation errors.     Darel Hong, MD 03/25/18 1343

## 2018-03-23 NOTE — ED Triage Notes (Signed)
Pt arrives to ED for a fall last night on concrete. Denies LOC. Denies blood thinners. Low platelet count per family member. Pt only c/o of head pain. Three abrasions noted to front of head. Family reports confusion today and napped today which he normally doesn't do. C/o of posterior HA as well. Took tylenol this AM. Family states he usually sundowns about this time.

## 2018-03-24 ENCOUNTER — Other Ambulatory Visit: Payer: Self-pay

## 2018-03-24 DIAGNOSIS — M549 Dorsalgia, unspecified: Secondary | ICD-10-CM

## 2018-03-30 NOTE — Telephone Encounter (Addendum)
Called Kristi and advised her to decrease seroquel to 25 mg nightly and see if his symptoms improve. She asked if she can split the tablet. This RN advised her the XR cannot be split. She is uncertain if his tabs are XR. This RN advised his prescription doesn't say XR but to ask the pharmacist to be certain about splitting it.  He no longer has 25 mg tabs. She then asked if he can take an extra 1/2 Vistaril or Benadryl at night. She stated he is still having difficulty winding down at night.  She stated just to help until the side effects wear off. This RN advised will discuss with Jinny Blossom and call her back. She verbalized understanding, appreciation.

## 2018-03-30 NOTE — Telephone Encounter (Signed)
Pt's daughter Kristi/HCPOA said after increasing medication he is getting progressively worse day after day, argumentative, hallucinating more, paranoia, talking in his sleep and fighting in his sleep and syncope. Please call to advise [phone].

## 2018-03-30 NOTE — Telephone Encounter (Signed)
Decrease to 25 mg and see if symptoms improve.

## 2018-03-31 NOTE — Telephone Encounter (Signed)
Spoke with daughter, [name] and advised her that John Summers will not add any other medications such as Benadryl at this time. These can cause increased confusion and risk of falling. Advised her that John Summers stated will wait to see if decreasing Seroquel will help his symptoms improve. If not, John Summers will consider switching to a different class of medications. Advised Kristi call back in about a week to update this RN. She verbalized understanding, appreciation of call back.

## 2018-03-31 NOTE — Telephone Encounter (Signed)
These medications especially Benadryl can increase confusion as well as risk of falls.  I prefer that these medications not be increased.

## 2018-04-06 ENCOUNTER — Ambulatory Visit: Payer: Medicare Other

## 2018-04-12 ENCOUNTER — Encounter: Payer: Self-pay | Admitting: Neurology

## 2018-04-12 NOTE — Telephone Encounter (Signed)
I would go up on seroquel in spite of the possible motor impairment , falls from rigidity, swallowing difficulties. He may need to see geriatric psychiatry, Dr Casimiro Needle. Please tell me if you want me to refer or increase dose to seroquel 50 mg. CD

## 2018-04-13 NOTE — Telephone Encounter (Signed)
I need the help of a geronto-psychiatrist .

## 2018-04-14 NOTE — Telephone Encounter (Signed)
Dr. Brett Fairy,   I second the idea of getting geriatric psych involved. I can think of no one else besides Dr. Casimiro Needle in our area.   Complicated situation.  He may be moving more towards skilled nursing level care from the notes.   If I can help, please let me know.

## 2018-04-15 MED ORDER — HYDROXYZINE HCL 10 MG PO TABS: 10.0000 mg | ORAL_TABLET | Freq: Two times a day (BID) | ORAL | 1 refills | Status: DC

## 2018-04-19 ENCOUNTER — Ambulatory Visit (INDEPENDENT_AMBULATORY_CARE_PROVIDER_SITE_OTHER): Payer: Medicare Other | Admitting: Neurology

## 2018-04-19 ENCOUNTER — Encounter: Payer: Self-pay | Admitting: Neurology

## 2018-04-19 VITALS — BP 126/70 | HR 76 | Ht 70.0 in | Wt 160.0 lb

## 2018-04-19 DIAGNOSIS — R441 Visual hallucinations: Secondary | ICD-10-CM | POA: Diagnosis not present

## 2018-04-19 DIAGNOSIS — F4489 Other dissociative and conversion disorders: Secondary | ICD-10-CM | POA: Diagnosis not present

## 2018-04-19 DIAGNOSIS — F02818 Dementia in other diseases classified elsewhere, unspecified severity, with other behavioral disturbance: Secondary | ICD-10-CM

## 2018-04-19 DIAGNOSIS — G252 Other specified forms of tremor: Secondary | ICD-10-CM | POA: Insufficient documentation

## 2018-04-19 DIAGNOSIS — R451 Restlessness and agitation: Secondary | ICD-10-CM

## 2018-04-19 DIAGNOSIS — G3183 Dementia with Lewy bodies: Secondary | ICD-10-CM

## 2018-04-19 DIAGNOSIS — F0281 Dementia in other diseases classified elsewhere with behavioral disturbance: Secondary | ICD-10-CM

## 2018-04-19 NOTE — Patient Instructions (Signed)
Lewy Body Dementia Dementia is the loss of two or more brain functions, such as:  Memory.  Decision making.  Behavior.  Speaking.  Thinking.  Problem solving.  Lewy body dementia is a type of dementia that gets worse with time. Lewy body dementia is also called dementia with Lewy bodies. What are the causes? This condition is caused by the buildup of proteins called Lewy bodies in brain cells. It is not known what causes the Lewy bodies to build up. What are the signs or symptoms? Early symptoms of this condition include:  Seeing things that are not there (hallucinating).  Trouble sleeping.  Loss of smell.  Later symptoms include:  Problems with problem solving, abstract thinking, and reasoning.  Memory problems.  Poor judgment.  Confusion.  Reduced attention span.  False ideas about another person or situation (delusions).  Disorganized speech.  Sleep problems, such as acting out dreams.  Shifts in alertness and attention, such as: ? Periods of drowsiness or lack of energy (lethargy). ? Long periods of time spent staring into space.  Changes in movement. For example: ? Trouble moving. ? Slow movement. ? Poor posture. ? Rigid muscles. ? Shuffling movements (gait). ? Tremors.  How is this diagnosed? This condition is diagnosed with an assessment by your health care provider. During this assessment, your health care provider will talk to you and your family, friends, or caregivers about your symptoms. A thorough medical history will be taken, and you will have a physical exam and tests. Tests may include:  Lab tests, such as blood or urine tests.  Imaging tests, such as a CT scan, PET scan, or MRI.  A lumbar puncture. This test involves removing and testing a small amount of the fluid that surrounds the brain and spinal cord.  An electroencephalogram (EEG). In this test, small metal discs are used to measure electrical activity in the brain.  Memory  tests, cognitive tests, and neuropsychological tests. These tests evaluate brain function.  How is this treated? There is no cure for this condition. Medicines may be prescribed to help slow down how fast the dementia gets worse and to help with symptoms. Follow these instructions at home: Medicine  Take over-the-counter and prescription medicines only as told by your health care provider.  Avoid taking medicines that can affect thinking, such as pain or sleeping medicines. Lifestyle   Make healthy lifestyle choices: ? Be physically active as told by your health care provider. ? Do not use any tobacco products, such as cigarettes, chewing tobacco, and e-cigarettes. If you need help quitting, ask your health care provider. ? Eat a healthy diet. ? Practice stress-management techniques when you get stressed. ? Stay social.  Drink enough fluid to keep your urine clear or pale yellow.  Make sure you sleep well. These tips can help you get a good night's rest: ? Avoid napping during the day. ? Keep your sleeping area dark and cool. ? Avoid exercising during the few hours before you go to bed. ? Avoid caffeine products in the evening. General instructions  Work with your health care provider to determine what you need help with and what your safety needs are.  If you were given a bracelet that tracks your location, make sure to wear it.  Keep all follow-up visits as told by your health care provider. This is important. Contact a health care provider if:  You have any new symptoms.  You have problems with choking or swallowing.  You have any  symptoms of a new or different illness. Get help right away if:  You develop a fever.  You have new or worsening confusion.  You have new or worsening trouble with sleeping.  You have a hard time staying awake.  You or your family members become concerned for your safety. This information is not intended to replace advice given to you  by your health care provider. Make sure you discuss any questions you have with your health care provider. Document Released: 05/23/2002 Document Revised: 10/07/2015 Document Reviewed: 05/29/2015 Elsevier Interactive Patient Education  [employer].
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
## 2018-04-23 ENCOUNTER — Other Ambulatory Visit: Payer: Self-pay | Admitting: Oncology

## 2018-04-23 ENCOUNTER — Ambulatory Visit
Admission: RE | Admit: 2018-04-23 | Discharge: 2018-04-23 | Disposition: A | Discharge status: Home / Self Care | Payer: Medicare Other | Source: Ambulatory Visit | Attending: Oncology | Admitting: Oncology

## 2018-04-23 DIAGNOSIS — M549 Dorsalgia, unspecified: Secondary | ICD-10-CM | POA: Diagnosis present

## 2018-04-23 DIAGNOSIS — M48061 Spinal stenosis, lumbar region without neurogenic claudication: Secondary | ICD-10-CM | POA: Diagnosis not present

## 2018-04-23 LAB — POCT I-STAT CREATININE: Creatinine, Ser: 0.9 mg/dL (ref 0.61–1.24)

## 2018-04-23 MED ORDER — GADOBENATE DIMEGLUMINE 529 MG/ML IV SOLN: 15.0000 mL | Freq: Once | INTRAVENOUS | Status: DC | PRN

## 2018-04-25 ENCOUNTER — Other Ambulatory Visit (INDEPENDENT_AMBULATORY_CARE_PROVIDER_SITE_OTHER): Payer: Medicare Other

## 2018-04-25 ENCOUNTER — Telehealth: Payer: Self-pay | Admitting: *Deleted

## 2018-04-25 DIAGNOSIS — R3915 Urgency of urination: Secondary | ICD-10-CM

## 2018-04-25 LAB — URINALYSIS, MICROSCOPIC ONLY: RBC / HPF: NONE SEEN (ref 0–?)

## 2018-04-25 NOTE — Telephone Encounter (Signed)
Copied from [place] [phone]. Topic: General - Other >> Apr 25, 2018  8:56 AM Lennox Solders wrote: Reason for CRM: pt daughter wanted to pick up urine cup and bring urine sample back to office. Pt daughter is aware he must be seen. Pt daughter Edd Arbour would like Particia Strahm to return her call.

## 2018-04-25 NOTE — Telephone Encounter (Signed)
Yes, it would be ok to drop of a urine sample

## 2018-04-25 NOTE — Telephone Encounter (Addendum)
Spoke with [employer].  She states Mr. Pavich got up 8 times last night to go to the bathroom but only urinated on 2 of those times.  She is concerned he may have a UTI and would like to know if she can come by office to get urine cup and drop a sample by the office without bringing him in.   Patient has lewy body dementia.  I advised I thought Dr. Lorelei Pont would be fine with her dropping a sample by the office for [place] to check without having to bring Mr. Heinz to the office.  Sterile urine cup and wipes left at front desk for [place] to pick up.  She also states that Mr. Lott has his MRI on his back on Saturday.

## 2018-04-26 ENCOUNTER — Encounter: Payer: Self-pay | Admitting: Oncology

## 2018-04-26 ENCOUNTER — Other Ambulatory Visit: Payer: Medicare Other

## 2018-04-26 ENCOUNTER — Telehealth: Payer: Self-pay

## 2018-04-26 NOTE — Addendum Note (Signed)
Addended by: Carter Kitten on: 04/26/2018 04:19 PM   Modules accepted: Orders

## 2018-04-26 NOTE — Progress Notes (Signed)
Urinalysis Dipstick not ran. Test was ordered as POCT and not send out.   Microscopic done and urine culture is pending.

## 2018-04-26 NOTE — Telephone Encounter (Signed)
Copied from [place] [phone]. Topic: Quick Communication - Other Results >> Apr 26, 2018  3:16 PM Lennox Solders wrote: Pt daughter [name] is calling and would like urine results

## 2018-04-26 NOTE — Telephone Encounter (Signed)
[name] notified so far the only thing we have back is the microscopic and by looking at that,  it does not look like John Summers has a UTI.  I advised we did also send it for culture which can take 2 to 3 days for the results.   She states the neurologist stopped his Aricept and she was reading up on that and his symptoms may be coming from stopping that medication.  I told [name] I would give her a call once the culture results are back.

## 2018-04-27 LAB — URINE CULTURE
MICRO NUMBER: 90953043
SPECIMEN QUALITY: ADEQUATE

## 2018-05-02 ENCOUNTER — Other Ambulatory Visit: Payer: Self-pay

## 2018-05-02 ENCOUNTER — Inpatient Hospital Stay: Payer: Medicare Other | Attending: Oncology

## 2018-05-02 DIAGNOSIS — F0281 Dementia in other diseases classified elsewhere with behavioral disturbance: Secondary | ICD-10-CM | POA: Insufficient documentation

## 2018-05-02 DIAGNOSIS — G3183 Dementia with Lewy bodies: Secondary | ICD-10-CM | POA: Diagnosis not present

## 2018-05-02 DIAGNOSIS — Z79899 Other long term (current) drug therapy: Secondary | ICD-10-CM | POA: Insufficient documentation

## 2018-05-02 DIAGNOSIS — D472 Monoclonal gammopathy: Secondary | ICD-10-CM

## 2018-05-02 DIAGNOSIS — D696 Thrombocytopenia, unspecified: Secondary | ICD-10-CM | POA: Diagnosis present

## 2018-05-02 DIAGNOSIS — Z87891 Personal history of nicotine dependence: Secondary | ICD-10-CM | POA: Diagnosis not present

## 2018-05-02 DIAGNOSIS — Z85828 Personal history of other malignant neoplasm of skin: Secondary | ICD-10-CM | POA: Diagnosis not present

## 2018-05-02 DIAGNOSIS — Z85038 Personal history of other malignant neoplasm of large intestine: Secondary | ICD-10-CM | POA: Diagnosis not present

## 2018-05-02 DIAGNOSIS — R5383 Other fatigue: Secondary | ICD-10-CM | POA: Diagnosis not present

## 2018-05-02 LAB — CBC WITH DIFFERENTIAL/PLATELET
BASOS PCT: 0 %
Basophils Absolute: 0 10*3/uL (ref 0–0.1)
EOS ABS: 0 10*3/uL (ref 0–0.7)
Eosinophils Relative: 0 %
HEMATOCRIT: 33.3 % — AB (ref 40.0–52.0)
HEMOGLOBIN: 11.3 g/dL — AB (ref 13.0–18.0)
LYMPHS ABS: 0.6 10*3/uL — AB (ref 1.0–3.6)
Lymphocytes Relative: 16 %
MCH: 35.8 pg — AB (ref 26.0–34.0)
MCHC: 33.8 g/dL (ref 32.0–36.0)
MCV: 105.8 fL — ABNORMAL HIGH (ref 80.0–100.0)
MONO ABS: 0.4 10*3/uL (ref 0.2–1.0)
Monocytes Relative: 11 %
Neutro Abs: 2.7 10*3/uL (ref 1.4–6.5)
Neutrophils Relative %: 73 %
Platelets: 95 10*3/uL — ABNORMAL LOW (ref 150–440)
RBC: 3.15 MIL/uL — ABNORMAL LOW (ref 4.40–5.90)
RDW: 14.6 % — AB (ref 11.5–14.5)
WBC: 3.8 10*3/uL (ref 3.8–10.6)

## 2018-05-02 LAB — BASIC METABOLIC PANEL
Anion gap: 10 (ref 5–15)
BUN: 20 mg/dL (ref 8–23)
CALCIUM: 8.9 mg/dL (ref 8.9–10.3)
CHLORIDE: 108 mmol/L (ref 98–111)
CO2: 24 mmol/L (ref 22–32)
CREATININE: 0.91 mg/dL (ref 0.61–1.24)
GFR calc non Af Amer: >60 mL/min (ref >60)
Glucose, Bld: 96 mg/dL (ref 70–99)
Potassium: 3.6 mmol/L (ref 3.5–5.1)
SODIUM: 142 mmol/L (ref 135–145)

## 2018-05-03 LAB — KAPPA/LAMBDA LIGHT CHAINS
Kappa free light chain: 36.4 mg/L — ABNORMAL HIGH (ref 3.3–19.4)
Kappa, lambda light chain ratio: 4.55 — ABNORMAL HIGH (ref 0.26–1.65)
Lambda free light chains: 8 mg/L (ref 5.7–26.3)

## 2018-05-03 LAB — IGG, IGA, IGM
IGM (IMMUNOGLOBULIN M), SRM: 37 mg/dL (ref 15–143)
IgA: 77 mg/dL (ref 61–437)
IgG (Immunoglobin G), Serum: 2116 mg/dL — ABNORMAL HIGH (ref 700–1600)

## 2018-05-04 LAB — MULTIPLE MYELOMA PANEL, SERUM
ALPHA 1: 0.2 g/dL (ref 0.0–0.4)
Albumin SerPl Elph-Mcnc: 3.8 g/dL (ref 2.9–4.4)
Albumin/Glob SerPl: 1.3 (ref 0.7–1.7)
Alpha2 Glob SerPl Elph-Mcnc: 0.5 g/dL (ref 0.4–1.0)
B-GLOBULIN SERPL ELPH-MCNC: 1.9 g/dL — AB (ref 0.7–1.3)
Gamma Glob SerPl Elph-Mcnc: 0.5 g/dL (ref 0.4–1.8)
Globulin, Total: 3.1 g/dL (ref 2.2–3.9)
IGG (IMMUNOGLOBIN G), SERUM: 2084 mg/dL — AB (ref 700–1600)
IgA: 76 mg/dL (ref 61–437)
IgM (Immunoglobulin M), Srm: 38 mg/dL (ref 15–143)
M PROTEIN SERPL ELPH-MCNC: 1.4 g/dL — AB
TOTAL PROTEIN ELP: 6.9 g/dL (ref 6.0–8.5)

## 2018-05-08 NOTE — Progress Notes (Signed)
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  INTERVAL HISTORY: Patient returns to clinic today for repeat laboratory work and routine six-month evaluation.  His performance status and memory continues to decline secondary to his underlying dementia.  He has no neurologic complaints.  He denies any recent fevers or illnesses.  He has a fair appetite and documented weight loss.  He has no chest pain or shortness of breath. He denies any bony pain.  He denies any nausea, vomiting, constipation, or diarrhea.  He has no melena or hematochezia.  He has no urinary complaints.  Patient offers no further specific complaints today.  REVIEW OF SYSTEMS:   Review of Systems  Constitutional: Positive for malaise/fatigue. Negative for fever and weight loss.  Eyes: Negative.   Respiratory: Negative.  Negative for cough and shortness of breath.   Cardiovascular: Negative.  Negative for chest pain and leg swelling.  Gastrointestinal: Negative.  Negative for blood in stool, diarrhea, melena and vomiting.  Genitourinary: Negative.  Negative for dysuria.  Musculoskeletal: Negative.  Negative for back pain.  Skin: Negative.   Neurological: Positive for weakness.  Endo/Heme/Allergies: Negative.  Does not bruise/bleed easily.  Psychiatric/Behavioral: Positive for memory loss. The patient is not nervous/anxious.     As per HPI. Otherwise, a complete review  of systems is negative.  PAST MEDICAL HISTORY: Past Medical History:  Diagnosis Date  . Colon adenocarcinoma ([place])   . Complication of anesthesia    severe confusion  . GERD (gastroesophageal reflux disease)   . History of colon polyps   . History of gallstones   . Hx of transfusion of platelets   . Hypertension   . ITP (idiopathic thrombocytopenic purpura)    Dr. Grayland Ormond follows  . Kidney stones   . Lewy body dementia with behavioral disturbance   . Major depressive disorder, recurrent episode, in partial remission with anxious distress ([place])   . Pancreatitis 2/11  . Pollen allergies   . Ribs, multiple fractures   . Skin cancer    ears  . Sleep apnea    "won't wear mask anymore" (12/22/2012)  . Unspecified essential hypertension     PAST SURGICAL HISTORY: Past Surgical History:  Procedure Laterality Date  . CARDIAC CATHETERIZATION  2000's   "tx'd w/acid reflux RX" (12/22/2012)  . CHOLECYSTECTOMY  2011  . ESOPHAGEAL DILATION  1990's?  Marland Kitchen HERNIA REPAIR  12/22/2012   IHR w/mesh  . INGUINAL HERNIA REPAIR Left [id number]  . INSERTION OF MESH N/A 12/22/2012   Procedure: INSERTION OF MESH;  Surgeon: Harl Bowie, MD;  Location: [facility];  Service: General;  Laterality: N/A;  . LITHOTRIPSY  2008   x2  . PARTIAL COLECTOMY  03/21/2012   Procedure: PARTIAL COLECTOMY;  Surgeon: Harl Bowie, MD;  Location: [facility];  Service: General;  Laterality: Right;  . PILONIDAL CYST EXCISION  1960's?  Marland Kitchen SKIN CANCER EXCISION     "face; ?left ear" (12/22/2012)  . TONSILLECTOMY AND ADENOIDECTOMY  1946  . TOTAL HIP ARTHROPLASTY Left ~ 2002  . UPPER GASTROINTESTINAL  ENDOSCOPY    . VENTRAL HERNIA REPAIR N/A 12/22/2012   Procedure: HERNIA REPAIR INCISIONAL ;  Surgeon: Harl Bowie, MD;  Location: [facility];  Service: General;  Laterality: N/A;    FAMILY HISTORY Family History  Problem Relation Age of Onset  . Colon cancer Mother   . Heart disease Mother   . Hypertension Mother   .  Alcohol abuse Father   . Esophageal cancer Neg Hx   . Rectal cancer Neg Hx   . Stomach cancer Neg Hx        ADVANCED DIRECTIVES:    HEALTH MAINTENANCE: Social History   Tobacco Use  . Smoking status: Former Smoker    Types: Pipe  . Smokeless tobacco: Never Used  . Tobacco comment: i4/06/2013 "only smoked pipe n college 1958"  Substance Use Topics  . Alcohol use: No    Alcohol/week: 0.0 standard drinks  . Drug use: No     Colonoscopy:  PAP:  Bone density:  Lipid panel:  Allergies  Allergen Reactions  . Aspirin Other (See Comments)  . Chicken Allergy Nausea And Vomiting    Unknown   . Nsaids     Low platelets  . Other Nausea And Vomiting    [place] per daughter     Current Outpatient Medications  Medication Sig Dispense Refill  . acetaminophen (TYLENOL) 500 MG tablet Take 500 mg by mouth 2 (two) times daily.    Marland Kitchen FLUoxetine (PROZAC) 40 MG capsule Take 1 capsule (40 mg total) by mouth daily. 90 capsule 3  . hydrOXYzine (ATARAX/VISTARIL) 10 MG tablet Take 1 tablet (10 mg total) by mouth 2 (two) times daily at 8 am and 10 pm. 180 tablet 1  . Melatonin 5 MG TABS Take 1 tablet (5 mg total) by mouth at bedtime. 60 tablet 5  . NON FORMULARY     . QUEtiapine (SEROQUEL) 50 MG tablet Take 1 tablet (50 mg total) by mouth at bedtime. 90 tablet 1  . Elastic Bandages & Supports (MEDICAL COMPRESSION SOCKS) MISC by Does not apply route.    Marland Kitchen esomeprazole (NEXIUM) 40 MG capsule Take 40 mg by mouth daily at 12 noon.    . traMADol (ULTRAM) 50 MG tablet Take 1 tablet (50 mg total) by mouth every 6 (six) hours as needed for moderate pain. (Patient not taking: Reported on 05/09/2018) 30 tablet 1   Current Facility-Administered Medications  Medication Dose Route Frequency Provider Last Rate Last Dose  . calcium-vitamin D (OSCAL WITH D) 500-200 MG-UNIT per tablet 1 tablet  1 tablet Oral Q breakfast Ngetich, Dinah C, NP        OBJECTIVE: Vitals:   05/09/18 1432  BP: 140/83  Pulse: 67   Resp: 18     Body mass index is 23.14 kg/m.    ECOG FS:2 - Symptomatic, <50% confined to bed  General: Thin, no acute distress.  Sitting in a wheelchair. Eyes: Pink conjunctiva, anicteric sclera. HEENT: Normocephalic, moist mucous membranes, clear oropharnyx. Lungs: Clear to auscultation bilaterally. Heart: Regular rate and rhythm. No rubs, murmurs, or gallops. Abdomen: Soft, nontender, nondistended. No organomegaly noted, normoactive bowel sounds. Musculoskeletal: No edema, cyanosis, or clubbing. Neuro: Alert, mildly confused. Cranial nerves grossly intact. Skin: No rashes or petechiae noted. Psych: Normal affect.   LAB RESULTS:  Lab Results  Component Value Date   NA 142 05/02/2018   K 3.6 05/02/2018   CL 108 05/02/2018   CO2 24 05/02/2018   GLUCOSE 96 05/02/2018   BUN 20  05/02/2018   CREATININE 0.91 05/02/2018   CALCIUM 8.9 05/02/2018   PROT 7.4 01/29/2018   ALBUMIN 3.8 01/29/2018   AST 14 (L) 01/29/2018   ALT 11 (L) 01/29/2018   ALKPHOS 67 01/29/2018   BILITOT 0.9 01/29/2018   GFRNONAA >60 05/02/2018   GFRAA >60 05/02/2018    Lab Results  Component Value Date   WBC 3.8 05/02/2018   NEUTROABS 2.7 05/02/2018   HGB 11.3 (L) 05/02/2018   HCT 33.3 (L) 05/02/2018   MCV 105.8 (H) 05/02/2018   PLT 95 (L) 05/02/2018   Lab Results  Component Value Date   TOTALPROTELP 6.9 05/02/2018   ALBUMINELP 3.5 01/03/2018   A1GS 0.2 01/03/2018   A2GS 0.5 01/03/2018   BETS 2.2 (H) 01/03/2018   GAMS 0.5 01/03/2018   MSPIKE 1.6 (H) 01/03/2018   SPEI Comment 01/03/2018     STUDIES: Mr Lumbar Spine Wo Contrast  Result Date: 04/23/2018 CLINICAL DATA:  Acute back pain. Personal history of colon cancer. Question metastatic disease. Malignancy suspected. Unable to obtain IV access postcontrast images. Bilateral hip pain. EXAM: MRI LUMBAR SPINE WITHOUT CONTRAST TECHNIQUE: Multiplanar, multisequence MR imaging of the lumbar spine was performed. No intravenous contrast was  administered. COMPARISON:  CT abdomen and pelvis 10/30/2017 FINDINGS: Segmentation: 5 non rib-bearing lumbar type vertebral bodies are present. The lowest fully formed vertebral body is L5. Alignment: Slight degenerative retrolisthesis at L1-2 and L2-3 is stable. Vertebrae: Superior endplate Schmorl's node at L3 is stable. Marrow signal and vertebral body heights are otherwise normal. Conus medullaris and cauda equina: Conus extends to the L1 level. Conus and cauda equina appear normal. Paraspinal and other soft tissues: Limited imaging of the abdomen is unremarkable. Disc levels: T12-L1: Negative. L1-2: Mild leftward disc bulging and bilateral facet hypertrophy without significant stenosis. L2-3: A broad-based disc protrusion is present. Moderate facet hypertrophy is noted. Mild subarticular narrowing is worse on the left. Mild foraminal narrowing is worse on the left. L3-4: A broad-based disc protrusion is present. Facet hypertrophy is noted bilaterally. This results in moderate central canal stenosis with subarticular narrowing bilaterally. Moderate foraminal narrowing is worse on the right. L4-5: Name broad-based disc protrusion is present. Moderate facet hypertrophy is noted bilaterally. Mild subarticular narrowing is worse left than right. Mild foraminal narrowing is present bilaterally. L5-S1: Moderate facet hypertrophy and spurring contributes to mild bilateral foraminal narrowing. The central canal is patent. IMPRESSION: 1. No significant central canal stenosis is at L3-4 with moderate bilateral foraminal stenosis and subarticular narrowing. 2. Moderate foraminal narrowing at L3-4 is worse on the right. 3. Mild subarticular and foraminal narrowing bilaterally at L2-3 is worse on the left. 4. Mild subarticular narrowing at L4-5 is worse on the left. Mild foraminal narrowing is present bilaterally at L4-5. 5. Moderate facet hypertrophy and spurring contributes to mild bilateral foraminal narrowing at L5-S1.  Electronically Signed   By: San Morelle M.D.   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  PLAN:    1.  MGUS: Patient's most recent M spike continues to be stable and unchanged at 1.6.  Previously, metastatic bone survey and CT scan were negative.  He continues to have an elevated IgG with decreased IgM and IgA.  It is possible that this is smoldering IgG multiple myeloma, but this would take a bone marrow biopsy to diagnose.  This is not necessary treatment will be difficult given his decreased performance status.  No intervention  is needed.  Given patient's multiple comorbidities and the fact that he is low risk for progression, he can return to clinic in 1 year for further evaluation.   2.  Thrombocytopenia: Decreased and stable.  Continue to monitor.   3.  Leukopenia: White blood cell count is now within normal limits. 4.  History of stage IIa adenocarcinoma of the cecum: Patient noted to have no high risk features, therefore adjuvant chemotherapy was not necessary.  Patient's most recent CEA is 2.3 and within normal limits.  No intervention is needed.  If patient had a recurrence, any further treatment would be difficult. 5.  Lewy Body Dementia: Seems to be progressive.  Continue follow up with neurologist.    Patient expressed understanding and was in agreement with this plan. He also understands that He can call clinic at any time with any questions, concerns, or complaints.   Lloyd Huger, MD   05/12/2018 9:44 AM

## 2018-05-09 ENCOUNTER — Other Ambulatory Visit: Payer: Self-pay

## 2018-05-09 ENCOUNTER — Inpatient Hospital Stay (HOSPITAL_BASED_OUTPATIENT_CLINIC_OR_DEPARTMENT_OTHER): Payer: Medicare Other | Admitting: Oncology

## 2018-05-09 VITALS — BP 140/83 | HR 67 | Resp 18 | Wt 161.3 lb

## 2018-05-09 DIAGNOSIS — Z85828 Personal history of other malignant neoplasm of skin: Secondary | ICD-10-CM

## 2018-05-09 DIAGNOSIS — D696 Thrombocytopenia, unspecified: Secondary | ICD-10-CM

## 2018-05-09 DIAGNOSIS — Z85038 Personal history of other malignant neoplasm of large intestine: Secondary | ICD-10-CM

## 2018-05-09 DIAGNOSIS — G3183 Dementia with Lewy bodies: Secondary | ICD-10-CM | POA: Diagnosis not present

## 2018-05-09 DIAGNOSIS — D472 Monoclonal gammopathy: Secondary | ICD-10-CM | POA: Diagnosis not present

## 2018-05-09 DIAGNOSIS — F0281 Dementia in other diseases classified elsewhere with behavioral disturbance: Secondary | ICD-10-CM

## 2018-05-09 DIAGNOSIS — R5383 Other fatigue: Secondary | ICD-10-CM

## 2018-05-09 DIAGNOSIS — Z87891 Personal history of nicotine dependence: Secondary | ICD-10-CM

## 2018-05-09 DIAGNOSIS — C180 Malignant neoplasm of cecum: Secondary | ICD-10-CM

## 2018-05-09 DIAGNOSIS — Z79899 Other long term (current) drug therapy: Secondary | ICD-10-CM

## 2018-05-09 NOTE — Progress Notes (Signed)
Here for follow up. w sitter .diff answering questions. " I feel great today" per pt

## 2018-05-17 ENCOUNTER — Telehealth: Payer: Self-pay | Admitting: Neurology

## 2018-05-17 ENCOUNTER — Other Ambulatory Visit: Payer: Self-pay | Admitting: Neurology

## 2018-05-17 NOTE — Telephone Encounter (Signed)
Pt daughter has called and is stating that at the pt recent apt Dr Dohmeier discontinued the Aricept. They have tried giving the hydralazine but found that in the morning if they gave this it would cause worsening in his gait instability. She states that recently he has became more agitated and speech is jumbled very hard to understand and increase in his combativeness. It was starting out 4-6 pm so they associated with sundowning and they attempted the seroquel at 50 mg but made him worse. She said she initially tried the hydroxyzine in am but found made his gait worse so she stuck with just one at bedtime. She states in last 2 days she has tried giving the hydroxyine 1 tab at 2 pm and then again at bedtime along with seroquel 25 mg at bedtime. She says the 4-6 pm episodes that he is having is now happening as early as noon. Daughter states that she is at a lost and unsure if there is anything that would help but wanted to see if it is worth restarting the aricept or if there is anything else Dr Brett Fairy would suggest. Informed the daughter I would make Dr Dohmeier aware and call back with what her thoughts were

## 2018-05-18 NOTE — Telephone Encounter (Signed)
Called the daughter to review what Dr Brett Fairy discussed. No answer. LVM for her to call back.

## 2018-06-01 ENCOUNTER — Encounter: Payer: Self-pay | Admitting: Neurology

## 2018-06-01 ENCOUNTER — Other Ambulatory Visit: Payer: Self-pay | Admitting: Neurology

## 2018-06-01 MED ORDER — HYDROXYZINE HCL 10 MG PO TABS: 10.0000 mg | ORAL_TABLET | Freq: Two times a day (BID) | ORAL | 1 refills | Status: AC

## 2018-06-01 MED ORDER — DONEPEZIL HCL 10 MG PO TABS: 10.0000 mg | ORAL_TABLET | Freq: Every day | ORAL | 1 refills | Status: AC

## 2018-06-02 ENCOUNTER — Other Ambulatory Visit: Payer: Self-pay | Admitting: Neurology

## 2018-06-02 ENCOUNTER — Telehealth: Payer: Self-pay | Admitting: Neurology

## 2018-06-02 MED ORDER — LORAZEPAM 0.5 MG PO TABS: 0.5000 mg | ORAL_TABLET | Freq: Every day | ORAL | 0 refills | Status: AC | PRN

## 2018-06-02 NOTE — Telephone Encounter (Signed)
PA completed and submitted through cover my meds/ [employer]. [id number] Can take up to 72 hours

## 2018-06-02 NOTE — Telephone Encounter (Signed)
Received a call from [employer] to address clinical question to help with processing the PA. They were able to approve the medication for the patient from 05/03/18-06/02/2019 Case ID # [id number]

## 2018-06-02 NOTE — Telephone Encounter (Signed)
PA submitted through cover my meds/[employer]. Approved immediately until 06/02/19 KEY:[id number]

## 2018-06-03 ENCOUNTER — Encounter: Payer: Self-pay | Admitting: Emergency Medicine

## 2018-06-03 ENCOUNTER — Emergency Department
Admission: EM | Admit: 2018-06-03 | Discharge: 2018-06-03 | Disposition: A | Discharge status: Home / Self Care | Payer: Medicare Other | Attending: Emergency Medicine | Admitting: Emergency Medicine

## 2018-06-03 ENCOUNTER — Telehealth: Payer: Self-pay | Admitting: *Deleted

## 2018-06-03 ENCOUNTER — Other Ambulatory Visit: Payer: Self-pay

## 2018-06-03 ENCOUNTER — Emergency Department: Payer: Medicare Other

## 2018-06-03 DIAGNOSIS — F0390 Unspecified dementia without behavioral disturbance: Secondary | ICD-10-CM | POA: Diagnosis not present

## 2018-06-03 DIAGNOSIS — I10 Essential (primary) hypertension: Secondary | ICD-10-CM | POA: Diagnosis not present

## 2018-06-03 DIAGNOSIS — R451 Restlessness and agitation: Secondary | ICD-10-CM | POA: Diagnosis present

## 2018-06-03 DIAGNOSIS — Z79899 Other long term (current) drug therapy: Secondary | ICD-10-CM | POA: Diagnosis not present

## 2018-06-03 DIAGNOSIS — Z87891 Personal history of nicotine dependence: Secondary | ICD-10-CM | POA: Diagnosis not present

## 2018-06-03 LAB — COMPREHENSIVE METABOLIC PANEL
ALK PHOS: 48 U/L (ref 38–126)
ALT: 9 U/L (ref 0–44)
AST: 15 U/L (ref 15–41)
Albumin: 3.9 g/dL (ref 3.5–5.0)
Anion gap: 9 (ref 5–15)
BILIRUBIN TOTAL: 0.7 mg/dL (ref 0.3–1.2)
BUN: 18 mg/dL (ref 8–23)
CALCIUM: 8.8 mg/dL — AB (ref 8.9–10.3)
CO2: 26 mmol/L (ref 22–32)
CREATININE: 0.88 mg/dL (ref 0.61–1.24)
Chloride: 109 mmol/L (ref 98–111)
GFR calc Af Amer: >60 mL/min (ref >60)
GLUCOSE: 127 mg/dL — AB (ref 70–99)
Potassium: 3.7 mmol/L (ref 3.5–5.1)
Sodium: 144 mmol/L (ref 135–145)
TOTAL PROTEIN: 7 g/dL (ref 6.5–8.1)

## 2018-06-03 LAB — URINALYSIS, COMPLETE (UACMP) WITH MICROSCOPIC
BACTERIA UA: NONE SEEN
BILIRUBIN URINE: NEGATIVE
GLUCOSE, UA: NEGATIVE mg/dL
HGB URINE DIPSTICK: NEGATIVE
Ketones, ur: NEGATIVE mg/dL
LEUKOCYTES UA: NEGATIVE
NITRITE: NEGATIVE
PROTEIN: 30 mg/dL — AB
SPECIFIC GRAVITY, URINE: 1.023 (ref 1.005–1.030)
pH: 6 (ref 5.0–8.0)

## 2018-06-03 LAB — CBC
HCT: 30.9 % — ABNORMAL LOW (ref 40.0–52.0)
Hemoglobin: 10.7 g/dL — ABNORMAL LOW (ref 13.0–18.0)
MCH: 36.5 pg — ABNORMAL HIGH (ref 26.0–34.0)
MCHC: 34.6 g/dL (ref 32.0–36.0)
MCV: 105.4 fL — ABNORMAL HIGH (ref 80.0–100.0)
PLATELETS: 82 10*3/uL — AB (ref 150–440)
RBC: 2.93 MIL/uL — ABNORMAL LOW (ref 4.40–5.90)
RDW: 14.8 % — AB (ref 11.5–14.5)
WBC: 2 10*3/uL — AB (ref 3.8–10.6)

## 2018-06-03 NOTE — ED Triage Notes (Signed)
Pt presents to ED via [facility]. EMS report pt was at [facility] with his caregiver and became agitated and aggressive, was yelling and throwing things at caregiver. EMS state pt's daughter told them pt has been getting gradually more agitated this week. Hx dementia. Is confused on arrival but cooperative.

## 2018-06-03 NOTE — Telephone Encounter (Signed)
Copied from [place] [id number]. Topic: Quick Communication - See Telephone Encounter >> Jun 03, 2018  1:20 PM Wynetta Emery, [place] C wrote: CRM for notification. See Telephone encounter for: 06/03/18.  Pt's daughter called in to request a FL2 form stat, she said that pt is being very irate and "has lost his mind"  They are working on a facility for placement but daughter in in tears and says that she needs form completed as soon as possible.  CB: [phone] [name]-

## 2018-06-03 NOTE — Telephone Encounter (Signed)
He is currently in the ER. Will watch to see if anything additional. It will be impossible to complete an FL2 form STAT - we are almost closed.

## 2018-06-03 NOTE — Discharge Instructions (Addendum)
Please seek medical attention for any high fevers, chest pain, shortness of breath, change in behavior, persistent vomiting, bloody stool or any other new or concerning symptoms.  

## 2018-06-03 NOTE — Telephone Encounter (Signed)
I will fill out FL-2 as much as able but it will not be signed until Monday when Dr. Lorelei Pont returns to clinic.

## 2018-06-03 NOTE — ED Provider Notes (Signed)
[facility] Emergency Department Provider Note  ____________________________________________   I have reviewed the triage vital signs and the nursing notes.   HISTORY  Chief Complaint Aggressive Behavior   History limited by: dementia, history obtained from daughter   HPI John Summers is a 84 y.o. male who presents to the emergency department today with concerns for agitation.  Patient does have a history of dementia.  Daughter states that the patient typically gets agitated later in the day but has recently been getting agitated earlier.  Apparently was out today when he became more agitated.  Letter has not noticed any obvious changes in the patient's physical health.  Denies any fevers.  No change in urination.  Patient has a chronic cough although no recent change to the cough.  Daughter states she is working on an [facility] to get the patient to a high level of care.   Per medical record review patient has a history of lew body dementia with behavioral disturbance.  Past Medical History:  Diagnosis Date  . Colon adenocarcinoma ([place])   . Complication of anesthesia    severe confusion  . GERD (gastroesophageal reflux disease)   . History of colon polyps   . History of gallstones   . Hx of transfusion of platelets   . Hypertension   . ITP (idiopathic thrombocytopenic purpura)    Dr. Grayland Ormond follows  . Kidney stones   . Lewy body dementia with behavioral disturbance   . Major depressive disorder, recurrent episode, in partial remission with anxious distress ([place])   . Pancreatitis 2/11  . Pollen allergies   . Ribs, multiple fractures   . Skin cancer    ears  . Sleep apnea    "won't wear mask anymore" (12/22/2012)  . Unspecified essential hypertension     Patient Active Problem List   Diagnosis Date Noted  . Confusion state 04/19/2018  . Psychomotor agitation 04/19/2018  . Hallucination, visual 04/19/2018  . Orthostatic tremor 04/19/2018  .  Malnutrition of moderate degree 11/03/2017  . Rib fractures 10/30/2017  . 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 Pancytopenia, acquired ([place]) 08/02/2017  . Visual hallucination 08/02/2017  . Generalized osteoarthritis 03/05/2017  . AF (paroxysmal atrial fibrillation) ([place]) 02/01/2017  . MGUS (monoclonal gammopathy of unknown significance) 05/10/2016  . Pancreatic mass 04/24/2016  . Thrombocytopenia ([place]) 10/04/2015  . Lewy body dementia with behavioral disturbance 11/16/2014  . Tremor of right hand 11/16/2014  . Major depressive disorder, recurrent episode, in partial remission with anxious distress ([place])   . REM sleep behavior disorder 12/07/2013  . Incisional hernia, without obstruction or gangrene 11/14/2012  . Adenocarcinoma of cecum ([place]) 03/19/2012  . GERD (gastroesophageal reflux disease) 03/19/2012  . Sleep apnea   . Essential hypertension     Past Surgical History:  Procedure Laterality Date  . CARDIAC CATHETERIZATION  2000's   "tx'd w/acid reflux RX" (12/22/2012)  . CHOLECYSTECTOMY  2011  . ESOPHAGEAL DILATION  1990's?  Marland Kitchen HERNIA REPAIR  12/22/2012   IHR w/mesh  . INGUINAL HERNIA REPAIR Left [id number]  . INSERTION OF MESH N/A 12/22/2012   Procedure: INSERTION OF MESH;  Surgeon: Harl Bowie, MD;  Location: [place];  Service: General;  Laterality: N/A;  . LITHOTRIPSY  2008   x2  . PARTIAL COLECTOMY  03/21/2012   Procedure: PARTIAL COLECTOMY;  Surgeon: Harl Bowie, MD;  Location: [place];  Service: General;  Laterality: Right;  . PILONIDAL CYST EXCISION  1960's?  Marland Kitchen SKIN  CANCER EXCISION     "face; ?left ear" (12/22/2012)  . TONSILLECTOMY AND ADENOIDECTOMY  1946  . TOTAL HIP ARTHROPLASTY Left ~ 2002  . UPPER GASTROINTESTINAL ENDOSCOPY    . VENTRAL HERNIA REPAIR N/A 12/22/2012   Procedure: HERNIA REPAIR INCISIONAL ;  Surgeon: Harl Bowie, MD;  Location: [place];  Service: General;  Laterality: N/A;    Prior to Admission medications   Medication Sig  Start Date End Date Taking? Authorizing Provider  acetaminophen (TYLENOL) 500 MG tablet Take 1,000 mg by mouth 2 (two) times daily.    Yes [provider]  donepezil (ARICEPT) 10 MG tablet Take 1 tablet (10 mg total) by mouth at bedtime. 06/01/18  Yes Dohmeier, Asencion Partridge, MD  FLUoxetine (PROZAC) 40 MG capsule Take 1 capsule (40 mg total) by mouth daily. 01/12/18  Yes Copland, Frederico Hamman, MD  hydrOXYzine (ATARAX/VISTARIL) 10 MG tablet Take 1 tablet (10 mg total) by mouth 2 (two) times daily. 06/01/18  Yes Dohmeier, Asencion Partridge, MD  Melatonin 5 MG TABS Take 1 tablet (5 mg total) by mouth at bedtime. 04/19/17  Yes Dohmeier, Asencion Partridge, MD  NON FORMULARY    Yes [provider]  QUEtiapine (SEROQUEL) 50 MG tablet Take 1 tablet (50 mg total) by mouth at bedtime. Patient taking differently: Take 25 mg by mouth at bedtime.  03/23/18  Yes Ward Givens, NP  Elastic Bandages & Supports (MEDICAL COMPRESSION SOCKS) MISC by Does not apply route.    [provider]  LORazepam (ATIVAN) 0.5 MG tablet Take 1 tablet (0.5 mg total) by mouth daily as needed for anxiety (agitation). 06/02/18   Dohmeier, Asencion Partridge, MD  traMADol (ULTRAM) 50 MG tablet Take 1 tablet (50 mg total) by mouth every 6 (six) hours as needed for moderate pain. Patient not taking: Reported on 05/09/2018 11/04/17   Rayburn, Claiborne Billings A, PA-C    Allergies Aspirin; Chicken allergy; Nsaids; and Other  Family History  Problem Relation Age of Onset  . Colon cancer Mother   . Heart disease Mother   . Hypertension Mother   . Alcohol abuse Father   . Esophageal cancer Neg Hx   . Rectal cancer Neg Hx   . Stomach cancer Neg Hx     Social History Social History   Tobacco Use  . Smoking status: Former Smoker    Types: Pipe  . Smokeless tobacco: Never Used  . Tobacco comment: i4/06/2013 "only smoked pipe n college 1958"  Substance Use Topics  . Alcohol use: No    Alcohol/week: 0.0 standard drinks  . Drug use: No    Review of  Systems Unable to obtain reliable ROS secondary to dementia. ____________________________________________   PHYSICAL EXAM:  VITAL SIGNS: ED Triage Vitals [06/03/18 1416]  Enc Vitals Group     BP (!) 172/99     Pulse Rate 77     Resp 18     Temp 98.3 F (36.8 C)     Temp Source Oral     SpO2 96 %     Weight 160 lb (72.6 kg)     Height 5\' 10"  (1.778 m)     Head Circumference      Peak Flow      Pain Score 0   Constitutional: Awake and alert.  Eyes: Conjunctivae are normal.  ENT      Head: Normocephalic and atraumatic.      Nose: No congestion/rhinnorhea.      Mouth/Throat: Mucous membranes are moist.      Neck: No  stridor. Hematological/Lymphatic/Immunilogical: No cervical lymphadenopathy. Cardiovascular: Normal rate, regular rhythm.  No murmurs, rubs, or gallops.  Respiratory: Normal respiratory effort without tachypnea nor retractions. Breath sounds are clear and equal bilaterally. No wheezes/rales/rhonchi. Gastrointestinal: Soft and non tender. No rebound. No guarding.  Genitourinary: Deferred Musculoskeletal: Normal range of motion in all extremities. No lower extremity edema. Neurologic:  Demented. Not completely oriented to events. Moving all extremities.  Skin:  Skin is warm, dry and intact. No rash noted. Psychiatric: Calm  ____________________________________________    LABS (pertinent positives/negatives)  CBC wbc 2.0, hgb 10.7, plt 82 CMP wnl except glu 127, ca 8.8 UA protein 30 otherwise unremarkable  ____________________________________________   EKG  None  ____________________________________________    RADIOLOGY  CT head No acute findings  ____________________________________________   PROCEDURES  Procedures  ____________________________________________   INITIAL IMPRESSION / ASSESSMENT AND PLAN / ED COURSE  Pertinent labs & imaging results that were available during my care of the patient were reviewed by me and considered in  my medical decision making (see chart for details).   Patient presented to the emergency department today because of concerns for agitation.  The patient has a history of Lewy body dementia with some agitation although appears it is getting worse.  Work-up was performed to evaluate for any precipitating factors.  No obvious signs of infection or change in the head CT.  At this point I unfortunately I do think they were change might be secondary to worsening dementia.  Discussed this with the daughter.  Will plan on discharging.  ____________________________________________   FINAL CLINICAL IMPRESSION(S) / ED DIAGNOSES  Final diagnoses:  Agitation     Note: This dictation was prepared with Dragon dictation. Any transcriptional errors that result from this process are unintentional     Nance Pear, MD 06/03/18 1606

## 2018-06-05 ENCOUNTER — Encounter: Payer: Self-pay | Admitting: Neurology

## 2018-06-06 NOTE — Telephone Encounter (Signed)
Kristy notified FL-2 is ready to be picked up at the front desk.

## 2018-06-06 NOTE — Telephone Encounter (Signed)
FL-2 form completed. Let her know.

## 2018-06-07 ENCOUNTER — Telehealth: Payer: Self-pay | Admitting: Neurology

## 2018-06-07 NOTE — Telephone Encounter (Signed)
Called Dr. Casimiro Needle office Patient was contacted back

## 2018-06-07 NOTE — Telephone Encounter (Signed)
I have called and spoke to Patient's daughter . And Relayed Dr. Karen Chafe office had tried to call patient a few times and brother . Daughter was not aware. Dr. Karen Chafe  office also relayed that patient had to pay 150.00 dollars for first visit . Daughter asked for there phone [phone]. Daughter will call me back if she needs anything else .   Referral Resent . Family Member Will have to call and schedule Patient because fee will have to paid first.

## 2018-06-07 NOTE — Telephone Encounter (Signed)
Patients daughter is having a hard time with the patient's behaviors. Will check on the referral status to Dr Casimiro Needle with our referral's department.

## 2018-06-14 ENCOUNTER — Encounter: Payer: Self-pay | Admitting: Neurology

## 2018-06-26 NOTE — Progress Notes (Deleted)
Dr. Frederico Hamman T. Kathlee Barnhardt, MD, [facility] [place], [address] Phone: [phone] Fax: [phone]  06/27/2018  Patient: John Summers, MRN: [id number], DOB: 24-Oct-1932, 84 y.o.  Primary Physician:  Owens Loffler, MD   No chief complaint on file.  Subjective:   John Summers is a 84 y.o. very pleasant male patient who presents with the following:  Geriatric psych hospitalization  Past Medical History, Surgical History, Social History, Family History, Problem List, Medications, and Allergies have been reviewed and updated if relevant.  Patient Active Problem List   Diagnosis Date Noted  . Lewy body dementia with behavioral disturbance ([place]) 11/16/2014    Priority: High  . Adenocarcinoma of cecum ([place]) 03/19/2012    Priority: High  . Sleep apnea     Priority: Medium  . Essential hypertension     Priority: Medium  . Psychomotor agitation 04/19/2018  . Malnutrition of moderate degree 11/03/2017  . Rib fractures 10/30/2017  . 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 Pancytopenia, acquired ([place]) 08/02/2017  . Visual hallucination 08/02/2017  . Generalized osteoarthritis 03/05/2017  . AF (paroxysmal atrial fibrillation) ([place]) 02/01/2017  . MGUS (monoclonal gammopathy of unknown significance) 05/10/2016  . Pancreatic mass 04/24/2016  . Thrombocytopenia ([place]) 10/04/2015  . Tremor of right hand 11/16/2014  . Major depressive disorder, recurrent episode, in partial remission with anxious distress ([place])   . REM sleep behavior disorder 12/07/2013  . Incisional hernia, without obstruction or gangrene 11/14/2012  . GERD (gastroesophageal reflux disease) 03/19/2012    Past Medical History:  Diagnosis Date  . Colon adenocarcinoma ([place])   . Complication of anesthesia    severe confusion  . GERD (gastroesophageal reflux disease)   . History of colon polyps   . History of gallstones   . Hx of  transfusion of platelets   . Hypertension   . ITP (idiopathic thrombocytopenic purpura)    Dr. Grayland Ormond follows  . Kidney stones   . Lewy body dementia with behavioral disturbance   . Major depressive disorder, recurrent episode, in partial remission with anxious distress ([place])   . Pancreatitis 2/11  . Pollen allergies   . Ribs, multiple fractures   . Skin cancer    ears  . Sleep apnea    "won't wear mask anymore" (12/22/2012)  . Unspecified essential hypertension     Past Surgical History:  Procedure Laterality Date  . CARDIAC CATHETERIZATION  2000's   "tx'd w/acid reflux RX" (12/22/2012)  . CHOLECYSTECTOMY  2011  . ESOPHAGEAL DILATION  1990's?  Marland Kitchen HERNIA REPAIR  12/22/2012   IHR w/mesh  . INGUINAL HERNIA REPAIR Left [id number]  . INSERTION OF MESH N/A 12/22/2012   Procedure: INSERTION OF MESH;  Surgeon: Harl Bowie, MD;  Location: [facility];  Service: General;  Laterality: N/A;  . LITHOTRIPSY  2008   x2  . PARTIAL COLECTOMY  03/21/2012   Procedure: PARTIAL COLECTOMY;  Surgeon: Harl Bowie, MD;  Location: [facility];  Service: General;  Laterality: Right;  . PILONIDAL CYST EXCISION  1960's?  Marland Kitchen SKIN CANCER EXCISION     "face; ?left ear" (12/22/2012)  . TONSILLECTOMY AND ADENOIDECTOMY  1946  . TOTAL HIP ARTHROPLASTY Left ~ 2002  . UPPER GASTROINTESTINAL ENDOSCOPY    . VENTRAL HERNIA REPAIR N/A 12/22/2012   Procedure: HERNIA REPAIR INCISIONAL ;  Surgeon: Harl Bowie, MD;  Location: [facility];  Service: General;  Laterality: N/A;    Social History  Socioeconomic History  . Marital status: Widowed    Spouse name: John Summers  . Number of children: 2  . Years of education: college  . Highest education level: Not on file  Occupational History    Employer: RETIRED    Comment: Retired, Production manager, [employer]  Social Needs  . Financial resource strain: Not on file  . Food insecurity:    Worry: Not on file    Inability: Not on file  . Transportation needs:     Medical: Not on file    Non-medical: Not on file  Tobacco Use  . Smoking status: Former Smoker    Types: Pipe  . Smokeless tobacco: Never Used  . Tobacco comment: i4/06/2013 "only smoked pipe n college 1958"  Substance and Sexual Activity  . Alcohol use: No    Alcohol/week: 0.0 standard drinks  . Drug use: No  . Sexual activity: Never  Lifestyle  . Physical activity:    Days per week: Not on file    Minutes per session: Not on file  . Stress: Not on file  Relationships  . Social connections:    Talks on phone: Not on file    Gets together: Not on file    Attends religious service: Not on file    Active member of club or organization: Not on file    Attends meetings of clubs or organizations: Not on file    Relationship status: Not on file  . Intimate partner violence:    Fear of current or ex partner: Not on file    Emotionally abused: Not on file    Physically abused: Not on file    Forced sexual activity: Not on file  Other Topics Concern  . Not on file  Social History Narrative   Patient is widowed John Summers) and lives at home with his daughter now   Patient has two children and his wife has two children..   Patient is retired, Office manager   Patient has a Financial risk analyst.   Patient is right-handed.   Patient drinks 1-2 cups of coffee daily.    Family History  Problem Relation Age of Onset  . Colon cancer Mother   . Heart disease Mother   . Hypertension Mother   . Alcohol abuse Father   . Esophageal cancer Neg Hx   . Rectal cancer Neg Hx   . Stomach cancer Neg Hx     Allergies  Allergen Reactions  . Aspirin Other (See Comments)  . Chicken Allergy Nausea And Vomiting    Unknown   . Nsaids     Low platelets  . Other Nausea And Vomiting    [place] per daughter     Medication list reviewed and updated in full in [place].   GEN: No acute illnesses, no fevers, chills. GI: No n/v/d, eating normally Pulm: No SOB Interactive and getting along  well at home.  Otherwise, ROS is as per the HPI.  Objective:   There were no vitals taken for this visit.  GEN: WDWN, NAD, Non-toxic, A & O x 3 HEENT: Atraumatic, Normocephalic. Neck supple. No masses, No LAD. Ears and Summers: No external deformity. CV: RRR, No M/G/R. No JVD. No thrill. No extra heart sounds. PULM: CTA B, no wheezes, crackles, rhonchi. No retractions. No resp. distress. No accessory muscle use. EXTR: No c/c/e NEURO Normal gait.  PSYCH: Normally interactive. Conversant. Not depressed or anxious appearing.  Calm demeanor.   Laboratory and Imaging Data:  Assessment and Plan:   ***

## 2018-06-27 ENCOUNTER — Telehealth: Payer: Self-pay | Admitting: Neurology

## 2018-06-27 ENCOUNTER — Inpatient Hospital Stay: Payer: Medicare Other | Admitting: Family Medicine

## 2018-06-27 NOTE — Telephone Encounter (Addendum)
The behavioral component should be followed by geronto-psychiatry, and the patient will see Dr. Casimiro Needle in follow up once released from [facility].  I spoke to [place] and told her that it will take time to change his medications.

## 2018-06-27 NOTE — Telephone Encounter (Signed)
Patient's daughter [name] (on [place]) states she took patient to [facility] geriatric behavioral unit 3 weeks ago. They increased Seroquel and decreased Prozac. He is also on Depakote for behavior, getting Zyrexa injections and still has outbursts of fighting. [name] would like a call back to discuss.

## 2018-07-07 ENCOUNTER — Telehealth: Payer: Self-pay | Admitting: Family Medicine

## 2018-07-07 NOTE — Telephone Encounter (Signed)
Copied from [place] [id number]. Topic: Quick Communication - Home Health Verbal Orders >> Jul 07, 2018  1:48 PM Scherrie Gerlach wrote: Langley Gauss with [facility] wants to know if Dr Lorelei Pont is in agreement for the pt to be admitted to hospice.  Advise Dr Lorelei Pont out, and Dr Diona Browner will fill in. She can take a verbal on the phone. [phone]

## 2018-07-08 NOTE — Telephone Encounter (Signed)
Verbal order given to [place] for hospice admission per Dr. Diona Browner.

## 2018-07-08 NOTE — Telephone Encounter (Signed)
Okay to give verbal for hospice admission.

## 2018-08-06 ENCOUNTER — Other Ambulatory Visit: Payer: Self-pay

## 2018-08-06 ENCOUNTER — Emergency Department

## 2018-08-06 ENCOUNTER — Emergency Department
Admission: EM | Admit: 2018-08-06 | Discharge: 2018-08-06 | Disposition: A | Discharge status: Home / Self Care | Attending: Emergency Medicine | Admitting: Emergency Medicine

## 2018-08-06 DIAGNOSIS — S0990XA Unspecified injury of head, initial encounter: Secondary | ICD-10-CM | POA: Diagnosis present

## 2018-08-06 DIAGNOSIS — Z79899 Other long term (current) drug therapy: Secondary | ICD-10-CM | POA: Diagnosis not present

## 2018-08-06 DIAGNOSIS — Y92129 Unspecified place in nursing home as the place of occurrence of the external cause: Secondary | ICD-10-CM | POA: Diagnosis not present

## 2018-08-06 DIAGNOSIS — S025XXA Fracture of tooth (traumatic), initial encounter for closed fracture: Secondary | ICD-10-CM | POA: Insufficient documentation

## 2018-08-06 DIAGNOSIS — Z23 Encounter for immunization: Secondary | ICD-10-CM | POA: Insufficient documentation

## 2018-08-06 DIAGNOSIS — S01511A Laceration without foreign body of lip, initial encounter: Secondary | ICD-10-CM | POA: Diagnosis not present

## 2018-08-06 DIAGNOSIS — Z87891 Personal history of nicotine dependence: Secondary | ICD-10-CM | POA: Diagnosis not present

## 2018-08-06 DIAGNOSIS — Y939 Activity, unspecified: Secondary | ICD-10-CM | POA: Insufficient documentation

## 2018-08-06 DIAGNOSIS — I10 Essential (primary) hypertension: Secondary | ICD-10-CM | POA: Insufficient documentation

## 2018-08-06 DIAGNOSIS — F0390 Unspecified dementia without behavioral disturbance: Secondary | ICD-10-CM | POA: Diagnosis not present

## 2018-08-06 DIAGNOSIS — W19XXXA Unspecified fall, initial encounter: Secondary | ICD-10-CM

## 2018-08-06 DIAGNOSIS — W010XXA Fall on same level from slipping, tripping and stumbling without subsequent striking against object, initial encounter: Secondary | ICD-10-CM | POA: Diagnosis not present

## 2018-08-06 DIAGNOSIS — Y999 Unspecified external cause status: Secondary | ICD-10-CM | POA: Diagnosis not present

## 2018-08-06 LAB — COMPREHENSIVE METABOLIC PANEL
ALBUMIN: 3.5 g/dL (ref 3.5–5.0)
ALK PHOS: 50 U/L (ref 38–126)
ALT: 13 U/L (ref 0–44)
ANION GAP: 9 (ref 5–15)
AST: 22 U/L (ref 15–41)
BUN: 16 mg/dL (ref 8–23)
CHLORIDE: 108 mmol/L (ref 98–111)
CO2: 25 mmol/L (ref 22–32)
Calcium: 8.5 mg/dL — ABNORMAL LOW (ref 8.9–10.3)
Creatinine, Ser: 0.67 mg/dL (ref 0.61–1.24)
GFR calc Af Amer: >60 mL/min (ref >60)
GFR calc non Af Amer: >60 mL/min (ref >60)
GLUCOSE: 106 mg/dL — AB (ref 70–99)
POTASSIUM: 4.2 mmol/L (ref 3.5–5.1)
SODIUM: 142 mmol/L (ref 135–145)
Total Bilirubin: 0.6 mg/dL (ref 0.3–1.2)
Total Protein: 6.7 g/dL (ref 6.5–8.1)

## 2018-08-06 LAB — CBC WITH DIFFERENTIAL/PLATELET
ABS IMMATURE GRANULOCYTES: 0.01 10*3/uL (ref 0.00–0.07)
BASOS PCT: 0 %
Basophils Absolute: 0 10*3/uL (ref 0.0–0.1)
Eosinophils Absolute: 0 10*3/uL (ref 0.0–0.5)
Eosinophils Relative: 1 %
HCT: 31.6 % — ABNORMAL LOW (ref 39.0–52.0)
HEMOGLOBIN: 10.3 g/dL — AB (ref 13.0–17.0)
Immature Granulocytes: 0 %
LYMPHS PCT: 41 %
Lymphs Abs: 1.1 10*3/uL (ref 0.7–4.0)
MCH: 35.9 pg — AB (ref 26.0–34.0)
MCHC: 32.6 g/dL (ref 30.0–36.0)
MCV: 110.1 fL — ABNORMAL HIGH (ref 80.0–100.0)
MONO ABS: 0.5 10*3/uL (ref 0.1–1.0)
MONOS PCT: 20 %
NEUTROS ABS: 1 10*3/uL — AB (ref 1.7–7.7)
NEUTROS PCT: 38 %
PLATELETS: 81 10*3/uL — AB (ref 150–400)
RBC: 2.87 MIL/uL — ABNORMAL LOW (ref 4.22–5.81)
RDW: 14.2 % (ref 11.5–15.5)
WBC: 2.7 10*3/uL — ABNORMAL LOW (ref 4.0–10.5)
nRBC: 0 % (ref 0.0–0.2)

## 2018-08-06 MED ORDER — ACETAMINOPHEN 500 MG PO TABS
1000.0000 mg | ORAL_TABLET | Freq: Once | ORAL | Status: AC
  Administered 2018-08-06: 1000 mg via ORAL

## 2018-08-06 MED ORDER — PENICILLIN V POTASSIUM 250 MG PO TABS
500.0000 mg | ORAL_TABLET | Freq: Once | ORAL | Status: AC
  Administered 2018-08-06: 500 mg via ORAL
  Filled 2018-08-06: qty 2

## 2018-08-06 MED ORDER — TETANUS-DIPHTH-ACELL PERTUSSIS 5-2.5-18.5 LF-MCG/0.5 IM SUSP
0.5000 mL | Freq: Once | INTRAMUSCULAR | Status: AC
  Administered 2018-08-06: 0.5 mL via INTRAMUSCULAR
  Filled 2018-08-06: qty 0.5

## 2018-08-06 MED ORDER — CHLORHEXIDINE GLUCONATE 0.12 % MT SOLN: 15.0000 mL | Freq: Once | OROMUCOSAL | Status: DC

## 2018-08-06 MED ORDER — BUPIVACAINE HCL (PF) 0.5 % IJ SOLN
30.0000 mL | Freq: Once | INTRAMUSCULAR | Status: DC
  Filled 2018-08-06: qty 30

## 2018-08-06 MED ORDER — PENICILLIN V POTASSIUM 250 MG/5ML PO SOLR: 500.0000 mg | Freq: Four times a day (QID) | ORAL | 0 refills | Status: AC

## 2018-08-06 NOTE — ED Provider Notes (Signed)
[facility] Emergency Department Provider Note  ____________________________________________   First MD Initiated Contact with Patient 08/06/18 [phone]     (approximate)  I have reviewed the triage vital signs and the nursing notes.   HISTORY  Chief Complaint Fall and Facial Laceration  Level 5 exemption history limited by the patient's chronic dementia  HPI John Summers is a 84 y.o. male who comes to the emergency department by EMS from his memory care facility for possible facial trauma.  No one witnessed the patient fall but they found him wandering around his memory care facility with blood coming from his nose and his lip.  EMS noted that he had a laceration to the inside of his upper lip and that his lower lip was "stuck" to his lower teeth.  The patient himself has severe dementia and is mumbling and cannot provide any meaningful history whatsoever.    Past Medical History:  Diagnosis Date  . Colon adenocarcinoma ([place])   . Complication of anesthesia    severe confusion  . GERD (gastroesophageal reflux disease)   . History of colon polyps   . History of gallstones   . Hx of transfusion of platelets   . Hypertension   . ITP (idiopathic thrombocytopenic purpura)    Dr. Grayland Ormond follows  . Kidney stones   . Lewy body dementia with behavioral disturbance ([place])   . Major depressive disorder, recurrent episode, in partial remission with anxious distress ([place])   . Pancreatitis 2/11  . Pollen allergies   . Ribs, multiple fractures   . Skin cancer    ears  . Sleep apnea    "won't wear mask anymore" (12/22/2012)  . Unspecified essential hypertension     Patient Active Problem List   Diagnosis Date Noted  . Psychomotor agitation 04/19/2018  . Malnutrition of moderate degree 11/03/2017  . Rib fractures 10/30/2017  . 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 Pancytopenia, acquired ([place]) 08/02/2017  . Visual hallucination 08/02/2017  .  Generalized osteoarthritis 03/05/2017  . AF (paroxysmal atrial fibrillation) ([place]) 02/01/2017  . MGUS (monoclonal gammopathy of unknown significance) 05/10/2016  . Pancreatic mass 04/24/2016  . Thrombocytopenia ([place]) 10/04/2015  . Lewy body dementia with behavioral disturbance ([place]) 11/16/2014  . Tremor of right hand 11/16/2014  . Major depressive disorder, recurrent episode, in partial remission with anxious distress ([place])   . REM sleep behavior disorder 12/07/2013  . Incisional hernia, without obstruction or gangrene 11/14/2012  . Adenocarcinoma of cecum ([place]) 03/19/2012  . GERD (gastroesophageal reflux disease) 03/19/2012  . Sleep apnea   . Essential hypertension     Past Surgical History:  Procedure Laterality Date  . CARDIAC CATHETERIZATION  2000's   "tx'd w/acid reflux RX" (12/22/2012)  . CHOLECYSTECTOMY  2011  . ESOPHAGEAL DILATION  1990's?  Marland Kitchen HERNIA REPAIR  12/22/2012   IHR w/mesh  . INGUINAL HERNIA REPAIR Left [id number]  . INSERTION OF MESH N/A 12/22/2012   Procedure: INSERTION OF MESH;  Surgeon: Harl Bowie, MD;  Location: [facility];  Service: General;  Laterality: N/A;  . LITHOTRIPSY  2008   x2  . PARTIAL COLECTOMY  03/21/2012   Procedure: PARTIAL COLECTOMY;  Surgeon: Harl Bowie, MD;  Location: [facility];  Service: General;  Laterality: Right;  . PILONIDAL CYST EXCISION  1960's?  Marland Kitchen SKIN CANCER EXCISION     "face; ?left ear" (12/22/2012)  . TONSILLECTOMY AND ADENOIDECTOMY  1946  . TOTAL HIP ARTHROPLASTY Left ~ 2002  . UPPER GASTROINTESTINAL  ENDOSCOPY    . VENTRAL HERNIA REPAIR N/A 12/22/2012   Procedure: HERNIA REPAIR INCISIONAL ;  Surgeon: Harl Bowie, MD;  Location: [facility];  Service: General;  Laterality: N/A;    Prior to Admission medications   Medication Sig Start Date End Date Taking? Authorizing Provider  acetaminophen (TYLENOL) 500 MG tablet Take 1,000 mg by mouth 2 (two) times daily.     [provider]  donepezil (ARICEPT) 10 MG tablet  Take 1 tablet (10 mg total) by mouth at bedtime. 06/01/18   Dohmeier, Asencion Partridge, MD  Elastic Bandages & Supports (MEDICAL COMPRESSION SOCKS) MISC by Does not apply route.    [provider]  FLUoxetine (PROZAC) 40 MG capsule Take 1 capsule (40 mg total) by mouth daily. 01/12/18   Copland, Frederico Hamman, MD  hydrOXYzine (ATARAX/VISTARIL) 10 MG tablet Take 1 tablet (10 mg total) by mouth 2 (two) times daily. 06/01/18   Dohmeier, Asencion Partridge, MD  LORazepam (ATIVAN) 0.5 MG tablet Take 1 tablet (0.5 mg total) by mouth daily as needed for anxiety (agitation). 06/02/18   Dohmeier, Asencion Partridge, MD  Melatonin 5 MG TABS Take 1 tablet (5 mg total) by mouth at bedtime. 04/19/17   Dohmeier, Asencion Partridge, MD  NON FORMULARY     [provider]  penicillin v potassium (VEETID) 250 MG/5ML solution Take 10 mLs (500 mg total) by mouth 4 (four) times daily for 7 days. 08/06/18 08/13/18  Darel Hong, MD  QUEtiapine (SEROQUEL) 50 MG tablet Take 1 tablet (50 mg total) by mouth at bedtime. Patient taking differently: Take 25 mg by mouth at bedtime.  03/23/18   Ward Givens, NP  traMADol (ULTRAM) 50 MG tablet Take 1 tablet (50 mg total) by mouth every 6 (six) hours as needed for moderate pain. Patient not taking: Reported on 05/09/2018 11/04/17   Rayburn, Claiborne Billings A, PA-C    Allergies Aspirin; Chicken allergy; Nsaids; and Other  Family History  Problem Relation Age of Onset  . Colon cancer Mother   . Heart disease Mother   . Hypertension Mother   . Alcohol abuse Father   . Esophageal cancer Neg Hx   . Rectal cancer Neg Hx   . Stomach cancer Neg Hx     Social History Social History   Tobacco Use  . Smoking status: Former Smoker    Types: Pipe  . Smokeless tobacco: Never Used  . Tobacco comment: i4/06/2013 "only smoked pipe n college 1958"  Substance Use Topics  . Alcohol use: No    Alcohol/week: 0.0 standard drinks  . Drug use: No    Review of Systems Level 5 exemption history is limited by the patient's  dementia ____________________________________________   PHYSICAL EXAM:  VITAL SIGNS: ED Triage Vitals  Enc Vitals Group     BP      Pulse      Resp      Temp      Temp src      SpO2      Weight      Height      Head Circumference      Peak Flow      Pain Score      Pain Loc      Pain Edu?      Excl. in [place]?     Constitutional: Pleasant and cooperative.  Severe dementia.  The patient seems to know his name but does not know anything else Eyes: PERRL EOMI. midrange and brisk Head: Abrasion to right upper lip.Marland Kitchen Nose:  No congestion/rhinnorhea. Mouth/Throat: Lower tooth stuck inside lower lip causing a 2 mm laceration.  Slight laceration not requiring repair on the inside of his upper lip No loose or tender teeth.  No facial tenderness or instability Neck: No stridor.  No midline tenderness or step-offs Cardiovascular: Normal rate, regular rhythm. Grossly normal heart sounds.  Good peripheral circulation. Respiratory: Normal respiratory effort.  No retractions. Lungs CTAB and moving good air Gastrointestinal: Soft nontender Musculoskeletal: No lower extremity edema   Neurologic: No gross focal neurologic deficits are appreciated. Skin: Abrasion to upper lip as above Psychiatric: Profound dementia    ____________________________________________   DIFFERENTIAL includes but not limited to  Mechanical fall, syncope, hyponatremia, intracerebral hemorrhage, cervical spine fracture, facial fracture ____________________________________________   LABS (all labs ordered are listed, but only abnormal results are displayed)  Labs Reviewed  COMPREHENSIVE METABOLIC PANEL - Abnormal; Notable for the following components:      Result Value   Glucose, Bld 106 (*)    Calcium 8.5 (*)    All other components within normal limits  CBC WITH DIFFERENTIAL/PLATELET - Abnormal; Notable for the following components:   WBC 2.7 (*)    RBC 2.87 (*)    Hemoglobin 10.3 (*)    HCT 31.6 (*)     MCV 110.1 (*)    MCH 35.9 (*)    Platelets 81 (*)    Neutro Abs 1.0 (*)    All other components within normal limits  URINALYSIS, COMPLETE (UACMP) WITH MICROSCOPIC    Lab work reviewed by me shows low white count and low platelets which are chronic for the patient __________________________________________  EKG  ED ECG REPORT I, Darel Hong, the attending physician, personally viewed and interpreted this ECG.  Date: 08/06/2018 EKG Time:  Rate: 75 Rhythm: normal sinus rhythm QRS Axis: Short axis Intervals: normal ST/T Wave abnormalities: normal Narrative Interpretation: no evidence of acute ischemia  ____________________________________________  RADIOLOGY  CT scans of the head neck and face reviewed by me with no intracerebral hemorrhage, no cervical spine fracture, and no obvious facial fractures but may have a slight dental fracture on the left ____________________________________________   PROCEDURES  Procedure(s) performed: no  Procedures  Critical Care performed: no  ____________________________________________   INITIAL IMPRESSION / ASSESSMENT AND PLAN / ED COURSE  Pertinent labs & imaging results that were available during my care of the patient were reviewed by me and considered in my medical decision making (see chart for details).   As part of my medical decision making, I reviewed the following data within the [place] History obtained from family if available, nursing notes, old chart and ekg, as well as notes from prior ED visits.  Patient comes to the emergency department with unclear trauma to his face.  He has profound dementia.  He did have his lower lip stuck to his tooth and I removed it easily.  He has a small laceration not requiring repair.  Lab work and head neck and face CTs are pending.  Tetanus will be updated.  Because of the mouth lacerations will give Peridex and penicillin VK.  Head neck and face CTs unremarkable  aside from slight dental fracture.  On physical exam his tooth is not loose so unclear if this is a true fracture or not.  Family at bedside and I discussed the need for antibiotics for the next several days.  He is discharged back to his facility in stable condition.      ____________________________________________  FINAL CLINICAL IMPRESSION(S) / ED DIAGNOSES  Final diagnoses:  Lip laceration, initial encounter  Fall, initial encounter  Closed fracture of tooth, initial encounter      NEW MEDICATIONS STARTED DURING THIS VISIT:  New Prescriptions   PENICILLIN V POTASSIUM (VEETID) 250 MG/5ML SOLUTION    Take 10 mLs (500 mg total) by mouth 4 (four) times daily for 7 days.     Note:  This document was prepared using Dragon voice recognition software and may include unintentional dictation errors.     Darel Hong, MD 08/06/18 [phone]

## 2018-08-06 NOTE — Discharge Instructions (Addendum)
Fortunately today your CT scans were reassuring aside from a slightly broken tooth on the left.  Please take your antibiotics as prescribed for 7 days and follow up with your PMD this coming Monday for a recheck.   Return to the ED sooner for any concerns.  It was a pleasure to take care of you today, and thank you for coming to our emergency department.  If you have any questions or concerns before leaving please ask the nurse to grab me and I'm more than happy to go through your aftercare instructions again.  If you were prescribed any opioid pain medication today such as Norco, Vicodin, Percocet, morphine, hydrocodone, or oxycodone please make sure you do not drive when you are taking this medication as it can alter your ability to drive safely.  If you have any concerns once you are home that you are not improving or are in fact getting worse before you can make it to your follow-up appointment, please do not hesitate to call 911 and come back for further evaluation.  Darel Hong, MD  Results for orders placed or performed during the hospital encounter of 08/06/18  Comprehensive metabolic panel  Result Value Ref Range   Sodium 142 135 - 145 mmol/L   Potassium 4.2 3.5 - 5.1 mmol/L   Chloride 108 98 - 111 mmol/L   CO2 25 22 - 32 mmol/L   Glucose, Bld 106 (H) 70 - 99 mg/dL   BUN 16 8 - 23 mg/dL   Creatinine, Ser 0.67 0.61 - 1.24 mg/dL   Calcium 8.5 (L) 8.9 - 10.3 mg/dL   Total Protein 6.7 6.5 - 8.1 g/dL   Albumin 3.5 3.5 - 5.0 g/dL   AST 22 15 - 41 U/L   ALT 13 0 - 44 U/L   Alkaline Phosphatase 50 38 - 126 U/L   Total Bilirubin 0.6 0.3 - 1.2 mg/dL   GFR calc non Af Amer >60 >60 mL/min   GFR calc Af Amer >60 >60 mL/min   Anion gap 9 5 - 15  CBC with Differential  Result Value Ref Range   WBC 2.7 (L) 4.0 - 10.5 K/uL   RBC 2.87 (L) 4.22 - 5.81 MIL/uL   Hemoglobin 10.3 (L) 13.0 - 17.0 g/dL   HCT 31.6 (L) 39.0 - 52.0 %   MCV 110.1 (H) 80.0 - 100.0 fL   MCH 35.9 (H) 26.0 - 34.0  pg   MCHC 32.6 30.0 - 36.0 g/dL   RDW 14.2 11.5 - 15.5 %   Platelets 81 (L) 150 - 400 K/uL   nRBC 0.0 0.0 - 0.2 %   Neutrophils Relative % 38 %   Neutro Abs 1.0 (L) 1.7 - 7.7 K/uL   Lymphocytes Relative 41 %   Lymphs Abs 1.1 0.7 - 4.0 K/uL   Monocytes Relative 20 %   Monocytes Absolute 0.5 0.1 - 1.0 K/uL   Eosinophils Relative 1 %   Eosinophils Absolute 0.0 0.0 - 0.5 K/uL   Basophils Relative 0 %   Basophils Absolute 0.0 0.0 - 0.1 K/uL   WBC Morphology MORPHOLOGY UNREMARKABLE    RBC Morphology MORPHOLOGY UNREMARKABLE    Smear Review MORPHOLOGY UNREMARKABLE    Immature Granulocytes 0 %   Abs Immature Granulocytes 0.01 0.00 - 0.07 K/uL   Ct Head Wo Contrast  Result Date: 08/06/2018 CLINICAL DATA:  Possible fall. Bleeding from the oral cavity. EXAM: CT HEAD WITHOUT CONTRAST CT MAXILLOFACIAL WITHOUT CONTRAST CT CERVICAL SPINE WITHOUT CONTRAST TECHNIQUE: Multidetector  CT imaging of the head, cervical spine, and maxillofacial structures were performed using the standard protocol without intravenous contrast. Multiplanar CT image reconstructions of the cervical spine and maxillofacial structures were also generated. COMPARISON:  None. FINDINGS: CT HEAD FINDINGS Brain: There is no mass, hemorrhage or extra-axial collection. The size and configuration of the ventricles and extra-axial CSF spaces are normal. The brain parenchyma is normal, without evidence of acute or chronic infarction. Vascular: No abnormal hyperdensity of the major intracranial arteries or dural venous sinuses. No intracranial atherosclerosis. Skull: The visualized skull base, calvarium and extracranial soft tissues are normal. CT MAXILLOFACIAL FINDINGS Osseous: There is osseous irregularity at the left anterior maxilla, anterior to the root of tooth 11. Orbits: The globes are intact. Normal appearance of the intra- and extraconal fat. Symmetric extraocular muscles and optic nerves. Sinuses: No fluid levels or advanced mucosal  thickening. Soft tissues: Normal visualized extracranial soft tissues. CT CERVICAL SPINE FINDINGS Alignment: No static subluxation. Facets are aligned. Occipital condyles and the lateral masses of C1-C2 are aligned. Skull base and vertebrae: No acute fracture. Soft tissues and spinal canal: No prevertebral fluid or swelling. No visible canal hematoma. Disc levels: No advanced spinal canal or neural foraminal stenosis. Upper chest: No pneumothorax, pulmonary nodule or pleural effusion. Other: Normal visualized paraspinal cervical soft tissues. IMPRESSION: 1. No acute intracranial abnormality. 2. No acute fracture or static subluxation of the cervical spine. 3. Osseous irregularity of the anterior left maxilla at the root of tooth 11, possibly minimally displaced fracture. Correlate with direct examination of left maxillary canine. Electronically Signed   By: Ulyses Jarred M.D.   On: 08/06/2018 05:27   Ct Cervical Spine Wo Contrast  Result Date: 08/06/2018 CLINICAL DATA:  Possible fall. Bleeding from the oral cavity. EXAM: CT HEAD WITHOUT CONTRAST CT MAXILLOFACIAL WITHOUT CONTRAST CT CERVICAL SPINE WITHOUT CONTRAST TECHNIQUE: Multidetector CT imaging of the head, cervical spine, and maxillofacial structures were performed using the standard protocol without intravenous contrast. Multiplanar CT image reconstructions of the cervical spine and maxillofacial structures were also generated. COMPARISON:  None. FINDINGS: CT HEAD FINDINGS Brain: There is no mass, hemorrhage or extra-axial collection. The size and configuration of the ventricles and extra-axial CSF spaces are normal. The brain parenchyma is normal, without evidence of acute or chronic infarction. Vascular: No abnormal hyperdensity of the major intracranial arteries or dural venous sinuses. No intracranial atherosclerosis. Skull: The visualized skull base, calvarium and extracranial soft tissues are normal. CT MAXILLOFACIAL FINDINGS Osseous: There is  osseous irregularity at the left anterior maxilla, anterior to the root of tooth 11. Orbits: The globes are intact. Normal appearance of the intra- and extraconal fat. Symmetric extraocular muscles and optic nerves. Sinuses: No fluid levels or advanced mucosal thickening. Soft tissues: Normal visualized extracranial soft tissues. CT CERVICAL SPINE FINDINGS Alignment: No static subluxation. Facets are aligned. Occipital condyles and the lateral masses of C1-C2 are aligned. Skull base and vertebrae: No acute fracture. Soft tissues and spinal canal: No prevertebral fluid or swelling. No visible canal hematoma. Disc levels: No advanced spinal canal or neural foraminal stenosis. Upper chest: No pneumothorax, pulmonary nodule or pleural effusion. Other: Normal visualized paraspinal cervical soft tissues. IMPRESSION: 1. No acute intracranial abnormality. 2. No acute fracture or static subluxation of the cervical spine. 3. Osseous irregularity of the anterior left maxilla at the root of tooth 11, possibly minimally displaced fracture. Correlate with direct examination of left maxillary canine. Electronically Signed   By: Ulyses Jarred M.D.   On: 08/06/2018  05:27   Ct Maxillofacial Wo Cm  Result Date: 08/06/2018 CLINICAL DATA:  Possible fall. Bleeding from the oral cavity. EXAM: CT HEAD WITHOUT CONTRAST CT MAXILLOFACIAL WITHOUT CONTRAST CT CERVICAL SPINE WITHOUT CONTRAST TECHNIQUE: Multidetector CT imaging of the head, cervical spine, and maxillofacial structures were performed using the standard protocol without intravenous contrast. Multiplanar CT image reconstructions of the cervical spine and maxillofacial structures were also generated. COMPARISON:  None. FINDINGS: CT HEAD FINDINGS Brain: There is no mass, hemorrhage or extra-axial collection. The size and configuration of the ventricles and extra-axial CSF spaces are normal. The brain parenchyma is normal, without evidence of acute or chronic infarction. Vascular:  No abnormal hyperdensity of the major intracranial arteries or dural venous sinuses. No intracranial atherosclerosis. Skull: The visualized skull base, calvarium and extracranial soft tissues are normal. CT MAXILLOFACIAL FINDINGS Osseous: There is osseous irregularity at the left anterior maxilla, anterior to the root of tooth 11. Orbits: The globes are intact. Normal appearance of the intra- and extraconal fat. Symmetric extraocular muscles and optic nerves. Sinuses: No fluid levels or advanced mucosal thickening. Soft tissues: Normal visualized extracranial soft tissues. CT CERVICAL SPINE FINDINGS Alignment: No static subluxation. Facets are aligned. Occipital condyles and the lateral masses of C1-C2 are aligned. Skull base and vertebrae: No acute fracture. Soft tissues and spinal canal: No prevertebral fluid or swelling. No visible canal hematoma. Disc levels: No advanced spinal canal or neural foraminal stenosis. Upper chest: No pneumothorax, pulmonary nodule or pleural effusion. Other: Normal visualized paraspinal cervical soft tissues. IMPRESSION: 1. No acute intracranial abnormality. 2. No acute fracture or static subluxation of the cervical spine. 3. Osseous irregularity of the anterior left maxilla at the root of tooth 11, possibly minimally displaced fracture. Correlate with direct examination of left maxillary canine. Electronically Signed   By: Ulyses Jarred M.D.   On: 08/06/2018 05:27

## 2018-08-06 NOTE — ED Notes (Signed)
Report called Benjamine Mola at [facility]. Pt is to be transported back via [facility]. RN will monitor.

## 2018-08-06 NOTE — ED Triage Notes (Signed)
Pt arrives to ED via [facility] from [facility] s/p fall (?). EMS reports the facility found the pt walking the hallway with bleeding from his mouth, and was unsure if he had fallen or simply bit his lip. Pt arrives with bleeding from the mouth, no other obvious injuries or trauma. Dr Mable Paris at bedside upon pt's arrival to ED.

## 2018-08-06 NOTE — ED Notes (Addendum)
Left with stepdaughter and caregiver who is taking patient back to facility. Left with all of belongings and discharge papers  No DNR in ED found.

## 2018-08-06 NOTE — ED Notes (Signed)
DNR found, Krisy-stepdaughter contacted and coming to get it. Left at first nurse desk in envelope. First nurse informed.

## 2018-08-06 NOTE — ED Notes (Signed)
Patient transported to CT at this time. 

## 2018-08-08 ENCOUNTER — Telehealth: Payer: Self-pay

## 2018-08-08 NOTE — Telephone Encounter (Signed)
Left message for patient's daughter, Gwenyth Bouillon, to check on patient after his ER visit and see if we can make a follow up appointment with one of the providers since Dr Lorelei Pont is out of the office.

## 2018-09-17 ENCOUNTER — Other Ambulatory Visit: Payer: Self-pay

## 2018-09-17 ENCOUNTER — Emergency Department

## 2018-09-17 ENCOUNTER — Emergency Department
Admission: EM | Admit: 2018-09-17 | Discharge: 2018-09-17 | Disposition: A | Discharge status: Home / Self Care | Attending: Emergency Medicine | Admitting: Emergency Medicine

## 2018-09-17 DIAGNOSIS — S0101XA Laceration without foreign body of scalp, initial encounter: Secondary | ICD-10-CM | POA: Insufficient documentation

## 2018-09-17 DIAGNOSIS — Z85828 Personal history of other malignant neoplasm of skin: Secondary | ICD-10-CM | POA: Diagnosis not present

## 2018-09-17 DIAGNOSIS — M25511 Pain in right shoulder: Secondary | ICD-10-CM | POA: Diagnosis present

## 2018-09-17 DIAGNOSIS — Y999 Unspecified external cause status: Secondary | ICD-10-CM | POA: Insufficient documentation

## 2018-09-17 DIAGNOSIS — Z96642 Presence of left artificial hip joint: Secondary | ICD-10-CM | POA: Diagnosis not present

## 2018-09-17 DIAGNOSIS — Z87891 Personal history of nicotine dependence: Secondary | ICD-10-CM | POA: Diagnosis not present

## 2018-09-17 DIAGNOSIS — Y92199 Unspecified place in other specified residential institution as the place of occurrence of the external cause: Secondary | ICD-10-CM | POA: Diagnosis not present

## 2018-09-17 DIAGNOSIS — Y939 Activity, unspecified: Secondary | ICD-10-CM | POA: Insufficient documentation

## 2018-09-17 DIAGNOSIS — W19XXXA Unspecified fall, initial encounter: Secondary | ICD-10-CM | POA: Diagnosis not present

## 2018-09-17 DIAGNOSIS — I10 Essential (primary) hypertension: Secondary | ICD-10-CM | POA: Diagnosis not present

## 2018-09-17 DIAGNOSIS — S0181XA Laceration without foreign body of other part of head, initial encounter: Secondary | ICD-10-CM

## 2018-09-17 DIAGNOSIS — Z79899 Other long term (current) drug therapy: Secondary | ICD-10-CM | POA: Diagnosis not present

## 2018-09-17 MED ORDER — LIDOCAINE HCL (PF) 1 % IJ SOLN
5.0000 mL | Freq: Once | INTRAMUSCULAR | Status: AC
  Administered 2018-09-17 (×2): 5 mL
  Filled 2018-09-17: qty 5

## 2018-09-17 MED ORDER — LIDOCAINE HCL (PF) 1 % IJ SOLN
INTRAMUSCULAR | Status: AC
  Administered 2018-09-17: 5 mL
  Filled 2018-09-17: qty 10

## 2018-09-17 NOTE — ED Triage Notes (Signed)
Pt arrives via ems from [facility], per staff pt "tipped his w/c over" ems reports that on their arrival pt was found in the w/c lying on its side with his right arm trapped under the w/c. Pt is c/o right arm pain and appears to have a lac to the right brow, pt is alert to self

## 2018-09-17 NOTE — ED Notes (Signed)
Called [facility] for transport to [facility]  2112

## 2018-09-17 NOTE — ED Provider Notes (Signed)
[facility] Emergency Department Provider Note __________________________________________   I have reviewed the triage vital signs and the nursing notes.   HISTORY  Chief Complaint Fall   History limited by and level 5 caveat due to: Dementia   HPI John Summers is a 85 y.o. male who presents to the emergency department today from living facility because of concerns for a fall.  Apparently the patient was found somewhat trapped in his wheelchair on the ground.  Apparently he had his right hand pinned beneath him.  Patient did suffer a small laceration to his right forehead.  The patient cannot give any history.  Per medical record review patient has a history of Lewy body dementia  Past Medical History:  Diagnosis Date  . 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 Complication of anesthesia    severe confusion  . GERD (gastroesophageal reflux disease)   . History of colon polyps   . History of gallstones   . Hx of transfusion of platelets   . Hypertension   . ITP (idiopathic thrombocytopenic purpura)    Dr. Grayland Ormond follows  . Kidney stones   . Lewy body dementia with behavioral disturbance ([place])   . Major depressive disorder, recurrent episode, in partial remission with anxious distress ([place])   . Pancreatitis 2/11  . Pollen allergies   . Ribs, multiple fractures   . Skin cancer    ears  . Sleep apnea    "won't wear mask anymore" (12/22/2012)  . Unspecified essential hypertension     Patient Active Problem List   Diagnosis Date Noted  . Psychomotor agitation 04/19/2018  . Malnutrition of moderate degree 11/03/2017  . Rib fractures 10/30/2017  . 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 Pancytopenia, acquired ([place]) 08/02/2017  . Visual hallucination 08/02/2017  . Generalized osteoarthritis 03/05/2017  . AF (paroxysmal atrial fibrillation) ([place]) 02/01/2017  . MGUS (monoclonal gammopathy of unknown significance) 05/10/2016  . Pancreatic mass  04/24/2016  . Thrombocytopenia ([place]) 10/04/2015  . Lewy body dementia with behavioral disturbance ([place]) 11/16/2014  . Tremor of right hand 11/16/2014  . Major depressive disorder, recurrent episode, in partial remission with anxious distress ([place])   . REM sleep behavior disorder 12/07/2013  . Incisional hernia, without obstruction or gangrene 11/14/2012  . Adenocarcinoma of cecum ([place]) 03/19/2012  . GERD (gastroesophageal reflux disease) 03/19/2012  . Sleep apnea   . Essential hypertension     Past Surgical History:  Procedure Laterality Date  . CARDIAC CATHETERIZATION  2000's   "tx'd w/acid reflux RX" (12/22/2012)  . CHOLECYSTECTOMY  2011  . ESOPHAGEAL DILATION  1990's?  Marland Kitchen HERNIA REPAIR  12/22/2012   IHR w/mesh  . INGUINAL HERNIA REPAIR Left [id number]  . INSERTION OF MESH N/A 12/22/2012   Procedure: INSERTION OF MESH;  Surgeon: Harl Bowie, MD;  Location: [facility];  Service: General;  Laterality: N/A;  . LITHOTRIPSY  2008   x2  . PARTIAL COLECTOMY  03/21/2012   Procedure: PARTIAL COLECTOMY;  Surgeon: Harl Bowie, MD;  Location: [place];  Service: General;  Laterality: Right;  . PILONIDAL CYST EXCISION  1960's?  Marland Kitchen SKIN CANCER EXCISION     "face; ?left ear" (12/22/2012)  . TONSILLECTOMY AND ADENOIDECTOMY  1946  . TOTAL HIP ARTHROPLASTY Left ~ 2002  . UPPER GASTROINTESTINAL ENDOSCOPY    . VENTRAL HERNIA REPAIR N/A 12/22/2012   Procedure: HERNIA REPAIR INCISIONAL ;  Surgeon: Harl Bowie, MD;  Location: [facility];  Service: General;  Laterality: N/A;  Prior to Admission medications   Medication Sig Start Date End Date Taking? Authorizing Provider  acetaminophen (TYLENOL) 500 MG tablet Take 1,000 mg by mouth 2 (two) times daily.     [provider]  donepezil (ARICEPT) 10 MG tablet Take 1 tablet (10 mg total) by mouth at bedtime. 06/01/18   Dohmeier, Asencion Partridge, MD  Elastic Bandages & Supports (MEDICAL COMPRESSION SOCKS) MISC by Does not apply route.    [provider]  FLUoxetine (PROZAC) 40 MG capsule Take 1 capsule (40 mg total) by mouth daily. 01/12/18   Copland, Frederico Hamman, MD  hydrOXYzine (ATARAX/VISTARIL) 10 MG tablet Take 1 tablet (10 mg total) by mouth 2 (two) times daily. 06/01/18   Dohmeier, Asencion Partridge, MD  LORazepam (ATIVAN) 0.5 MG tablet Take 1 tablet (0.5 mg total) by mouth daily as needed for anxiety (agitation). 06/02/18   Dohmeier, Asencion Partridge, MD  Melatonin 5 MG TABS Take 1 tablet (5 mg total) by mouth at bedtime. 04/19/17   Dohmeier, Asencion Partridge, MD  NON FORMULARY     [provider]  QUEtiapine (SEROQUEL) 50 MG tablet Take 1 tablet (50 mg total) by mouth at bedtime. Patient taking differently: Take 25 mg by mouth at bedtime.  03/23/18   Ward Givens, NP  traMADol (ULTRAM) 50 MG tablet Take 1 tablet (50 mg total) by mouth every 6 (six) hours as needed for moderate pain. Patient not taking: Reported on 05/09/2018 11/04/17   Rayburn, Claiborne Billings A, PA-C    Allergies Aspirin; Chicken allergy; Nsaids; and Other  Family History  Problem Relation Age of Onset  . Colon cancer Mother   . Heart disease Mother   . Hypertension Mother   . Alcohol abuse Father   . Esophageal cancer Neg Hx   . Rectal cancer Neg Hx   . Stomach cancer Neg Hx     Social History Social History   Tobacco Use  . Smoking status: Former Smoker    Types: Pipe  . Smokeless tobacco: Never Used  . Tobacco comment: i4/06/2013 "only smoked pipe n college 1958"  Substance Use Topics  . Alcohol use: No    Alcohol/week: 0.0 standard drinks  . Drug use: No    Review of Systems Unable to obtain reliable review of systems secondary to dementia ____________________________________________   PHYSICAL EXAM:  VITAL SIGNS: ED Triage Vitals [09/17/18 1900]  Enc Vitals Group     BP (!) 158/88     Pulse Rate 73     Resp 18     Temp 98.9 F (37.2 C)     Temp Source Oral     SpO2 100 %   Constitutional: Awake and alert.  Not oriented. Eyes: Conjunctivae are normal.   ENT      Head: Normocephalic      Nose: No congestion/rhinnorhea.      Mouth/Throat: Mucous membranes are moist.      Neck: No stridor. Hematological/Lymphatic/Immunilogical: No cervical lymphadenopathy. Cardiovascular: Normal rate, regular rhythm.  No murmurs, rubs, or gallops.  Respiratory: Normal respiratory effort without tachypnea nor retractions. Breath sounds are clear and equal bilaterally. No wheezes/rales/rhonchi. Gastrointestinal: Soft and non tender. No rebound. No guarding.  Genitourinary: Deferred Musculoskeletal: Tender to palpation over the right shoulder Neurologic:  Normal speech and language. No gross focal neurologic deficits are appreciated.  Skin: Roughly 1.5 cm laceration to the right temple Psychiatric: Mood and affect are normal. Speech and behavior are normal. Patient exhibits appropriate insight and judgment.  ____________________________________________    LABS (pertinent positives/negatives)  None  ____________________________________________   EKG  None  ____________________________________________    RADIOLOGY  CT head/cervical spine No acute abnormalities  Right humerus No acute abnormalities  ____________________________________________   PROCEDURES  Procedures  LACERATION REPAIR Performed by: Nance Pear Authorized by: Nance Pear Consent: Verbal consent obtained. Risks and benefits: risks, benefits and alternatives were discussed Consent given by: patient Patient identity confirmed: provided demographic data Prepped and Draped in normal sterile fashion Wound explored  Laceration Location: right temple  Laceration Length: 1.5cm  No Foreign Bodies seen or palpated  Anesthesia: local infiltration  Local anesthetic: lidocaine 1% without epinephrine  Anesthetic total: 2 ml  Irrigation method: syringe Amount of cleaning: standard  Skin closure: 5-0 vicryl rapide  Number of sutures: 4  Technique: simple  interrupted  Patient tolerance: Patient tolerated the procedure well with no immediate complications.  ____________________________________________   INITIAL IMPRESSION / ASSESSMENT AND PLAN / ED COURSE  Pertinent labs & imaging results that were available during my care of the patient were reviewed by me and considered in my medical decision making (see chart for details).   Patient presented to the emergency department today because of a fall.  CT head, cervical spine and right humerus imaging all negative.  Patient family did arrive and states that falling is an issue for the patient.  He is also complained of right shoulder pain in the past was told he had arthritis.  Patient does have very small laceration of the right temple which was repaired. Discussed infection return precautions.  ____________________________________________   FINAL CLINICAL IMPRESSION(S) / ED DIAGNOSES  Final diagnoses:  Fall, initial encounter  Right shoulder pain, unspecified chronicity  Facial laceration, initial encounter     Note: This dictation was prepared with Dragon dictation. Any transcriptional errors that result from this process are unintentional     Nance Pear, MD 09/17/18 2054

## 2018-09-17 NOTE — Discharge Instructions (Addendum)
Please seek medical attention for any high fevers, chest pain, shortness of breath, change in behavior, persistent vomiting, bloody stool or any other new or concerning symptoms.  

## 2018-09-17 NOTE — ED Notes (Signed)
pts wounds cleaned at this time .

## 2018-09-26 IMAGING — CT CT CERVICAL SPINE W/O CM
3 of 6 series · 12 of 33 positions shown, 14 images · non-contrast
Comparison: Prior CT from 07/21/2017.

CLINICAL DATA: Initial evaluation for acute trauma, fall.

EXAM:
CT HEAD WITHOUT CONTRAST
CT CERVICAL SPINE WITHOUT CONTRAST
TECHNIQUE: Multidetector CT imaging of the head and cervical spine was
performed following the standard protocol without intravenous
contrast. Multiplanar CT image reconstructions of the cervical spine
were also generated.

[Series 5: c spine soft · axial · 0.40mm/px · z∈[-252,-126]mm · 6 of 81 slices shown, 8 images]
[im 9/81  soft-tissue]
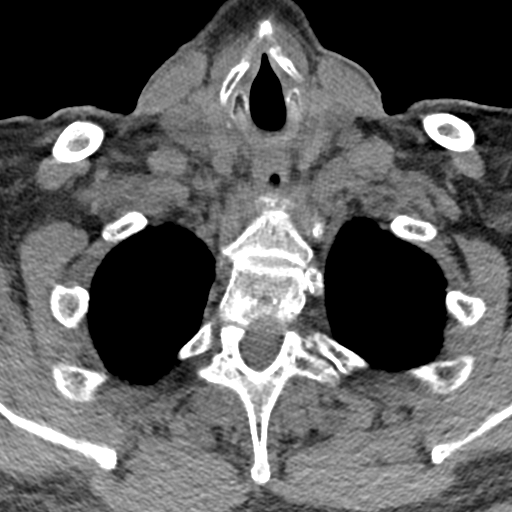
[im 9/81  bone]
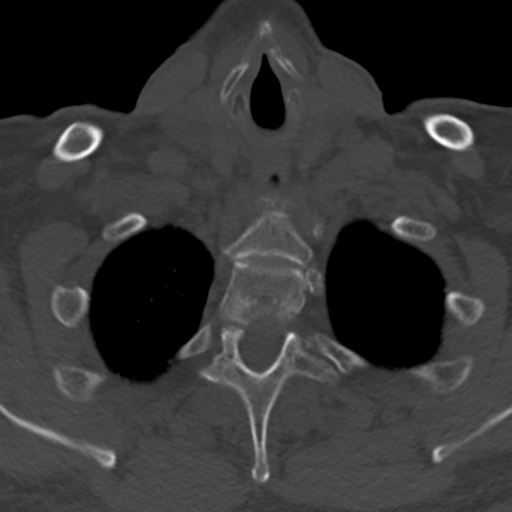
[im 27/81  bone]
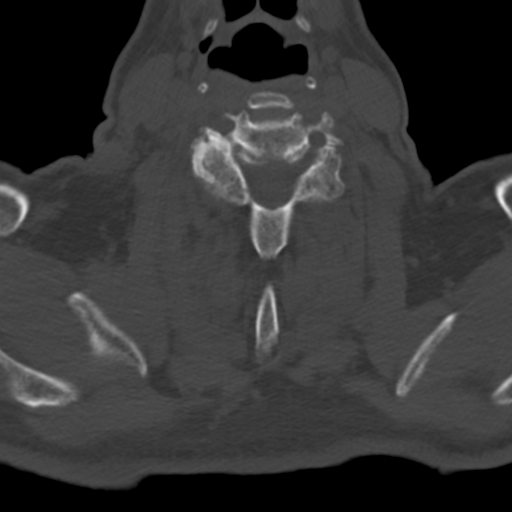
[im 36/81  bone]
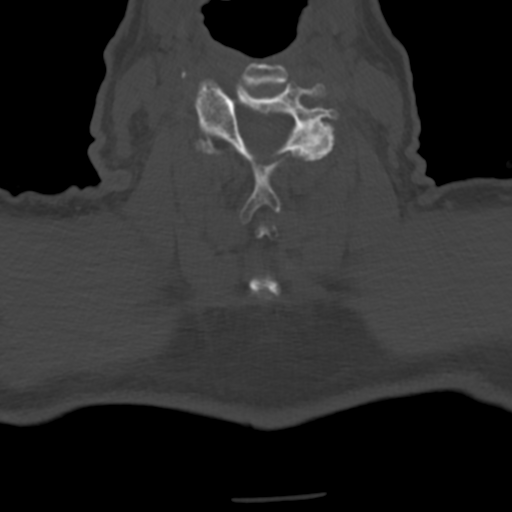
[im 45/81  bone]
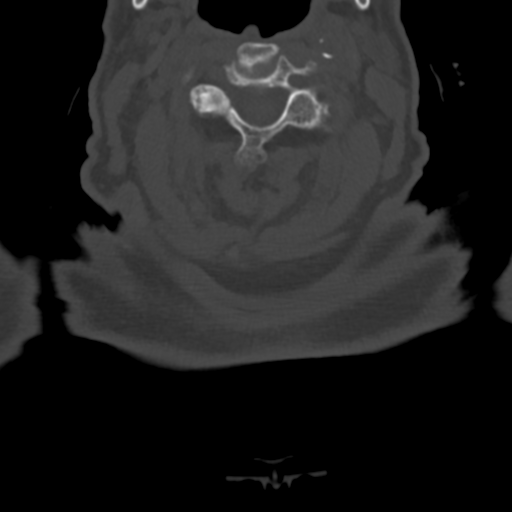
[im 63/81  soft-tissue]
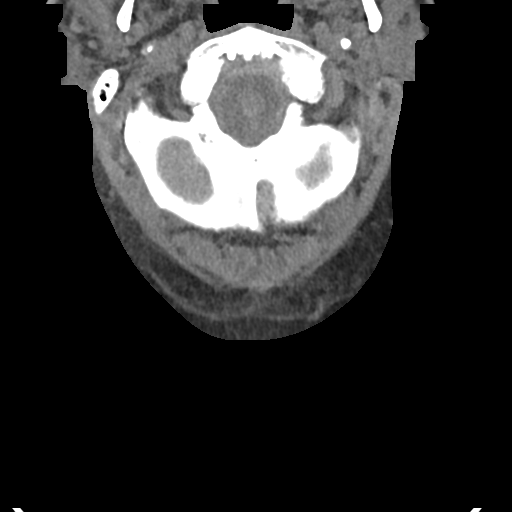
[im 63/81  bone]
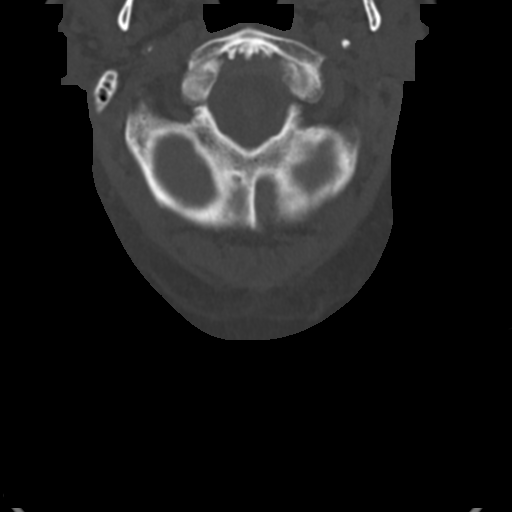
[im 72/81  bone]
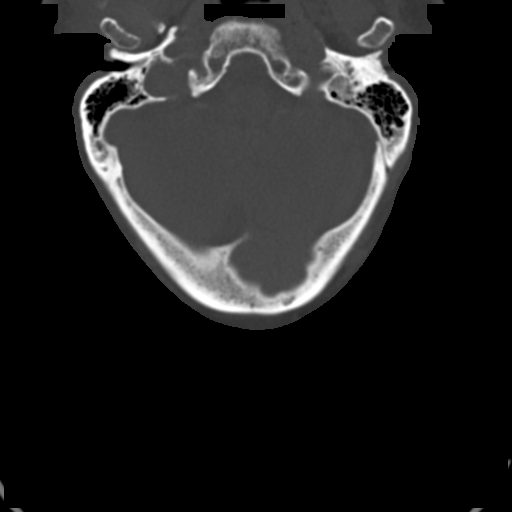

[Series 8: sagittal bone · sagittal · 0.29mm/px · 4 of 48 slices shown]
[im 10/48  bone]
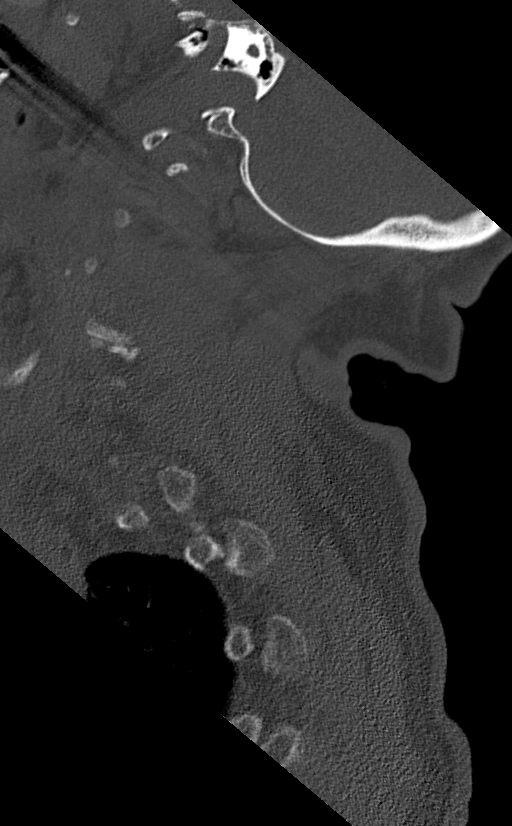
[im 19/48  bone]
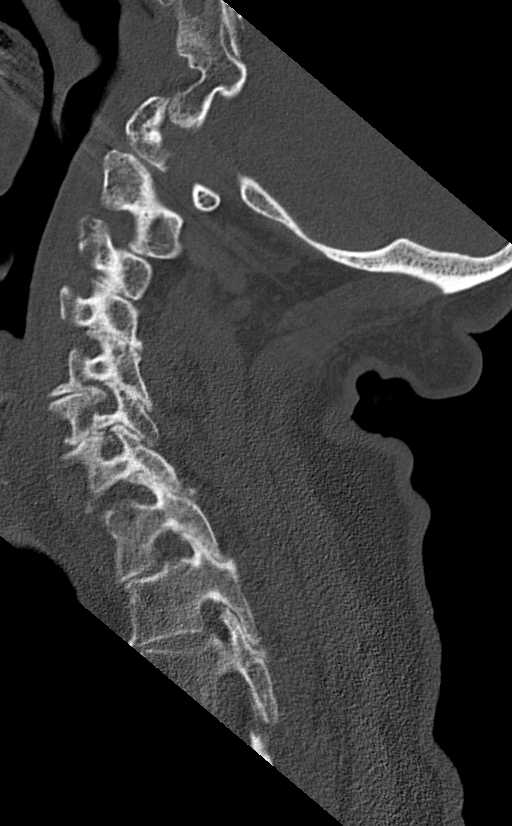
[im 29/48  bone]
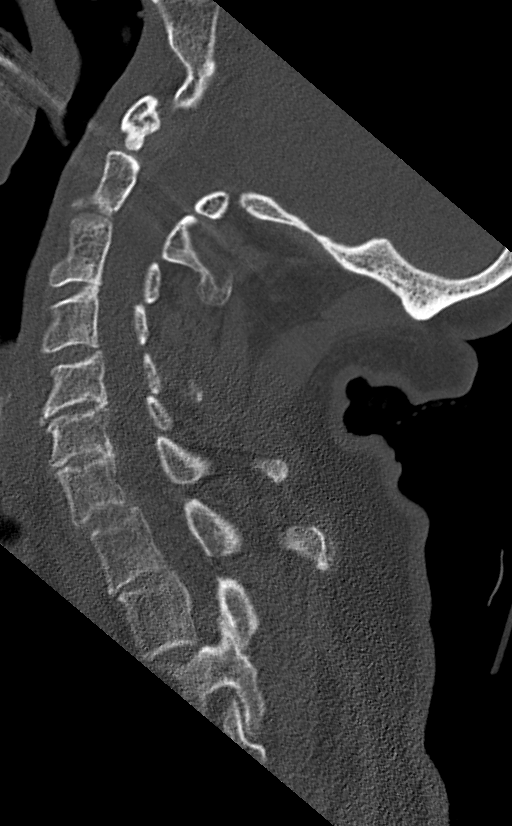
[im 38/48  bone]
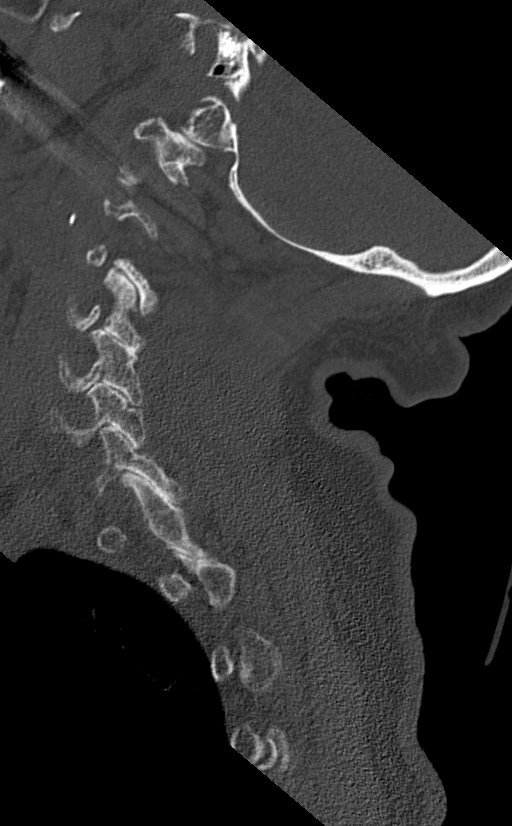

[Series 9: coronal bone · coronal · 0.25mm/px · 2 of 67 slices shown]
[im 45/67  bone]
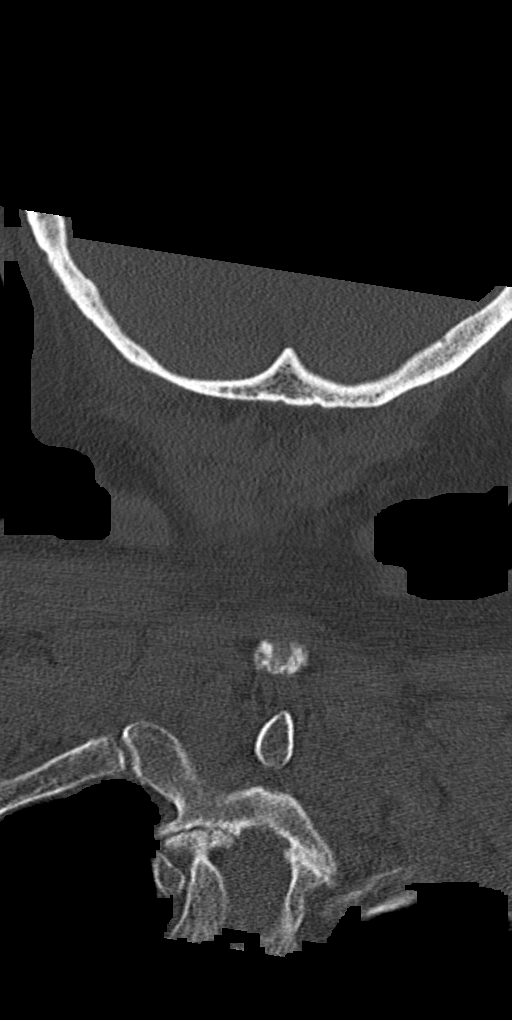
[im 56/67  bone]
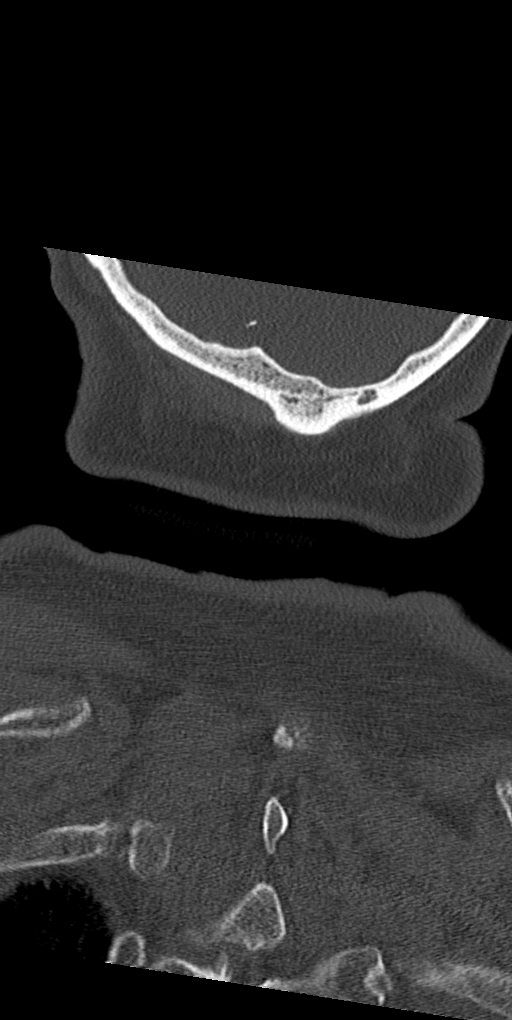

[12 of 33 positions shown; findings below may reference images not displayed]

FINDINGS: CT HEAD FINDINGS

Brain: Atrophy with chronic small vessel ischemic disease. No acute
intracranial hemorrhage. No acute large vessel territory infarct. No
mass lesion, midline shift or mass effect. No hydrocephalus. No
extra-axial fluid collection.

Vascular: No hyperdense vessel. Scattered vascular calcifications
noted within the carotid siphons.

Skull: Scalp soft tissues and calvarium within normal limits.

Sinuses/Orbits: Globes normal soft tissues demonstrate no acute
abnormality. Mild scattered mucosal thickening within the ethmoidal
air cells. Paranasal sinuses otherwise clear. No mastoid effusion.

Other: None.

CT CERVICAL SPINE FINDINGS

Alignment: Trace anterolisthesis of C4 on C5, C7 on T1, and T1 on
T2. Mild straightening of the normal cervical lordosis.

Skull base and vertebrae: Skullbase intact.  No acute fracture.

Soft tissues and spinal canal: Soft tissues of the neck demonstrate
no acute abnormality. No prevertebral edema. Spinal canal within
normal limits.

Disc levels: Moderate multilevel degenerative spondylolysis, most
notable at C6-7.

Upper chest: Visualized upper chest demonstrates no acute
abnormality. Visualized lung apices are clear.
IMPRESSION: CT BRAIN:

1. No acute intracranial process.
2. Age-related cerebral atrophy with chronic small vessel ischemic
disease, stable.

CT CERVICAL SPINE:

1. No acute traumatic injury within the cervical spine.
2. Moderate multilevel degenerate spondylolysis, greatest at C6-7.

## 2018-11-11 ENCOUNTER — Telehealth: Payer: Self-pay | Admitting: Neurology

## 2018-11-11 NOTE — Telephone Encounter (Signed)
Received a pharmacy e mail warning about fluoxetine being taken with Haldol. I found he no longer takes this medications- see med list in Epic.  In case this is incorrect , I will suggest to reduce the Prozac- and wean off.  Depression is not the main problem in this case of dementia assoc. behavior changes. Ativan will be his emergency agent for calming him down. Larey Seat, MD

## 2018-12-14 DEATH — deceased

## 2018-12-27 IMAGING — DX DG CHEST 1V PORT
1 series · 1 of 1 positions shown · non-contrast
Comparison: CT scan 10/30/2017

CLINICAL DATA: Pneumothorax.

EXAM:
PORTABLE CHEST 1 VIEW

[chest]
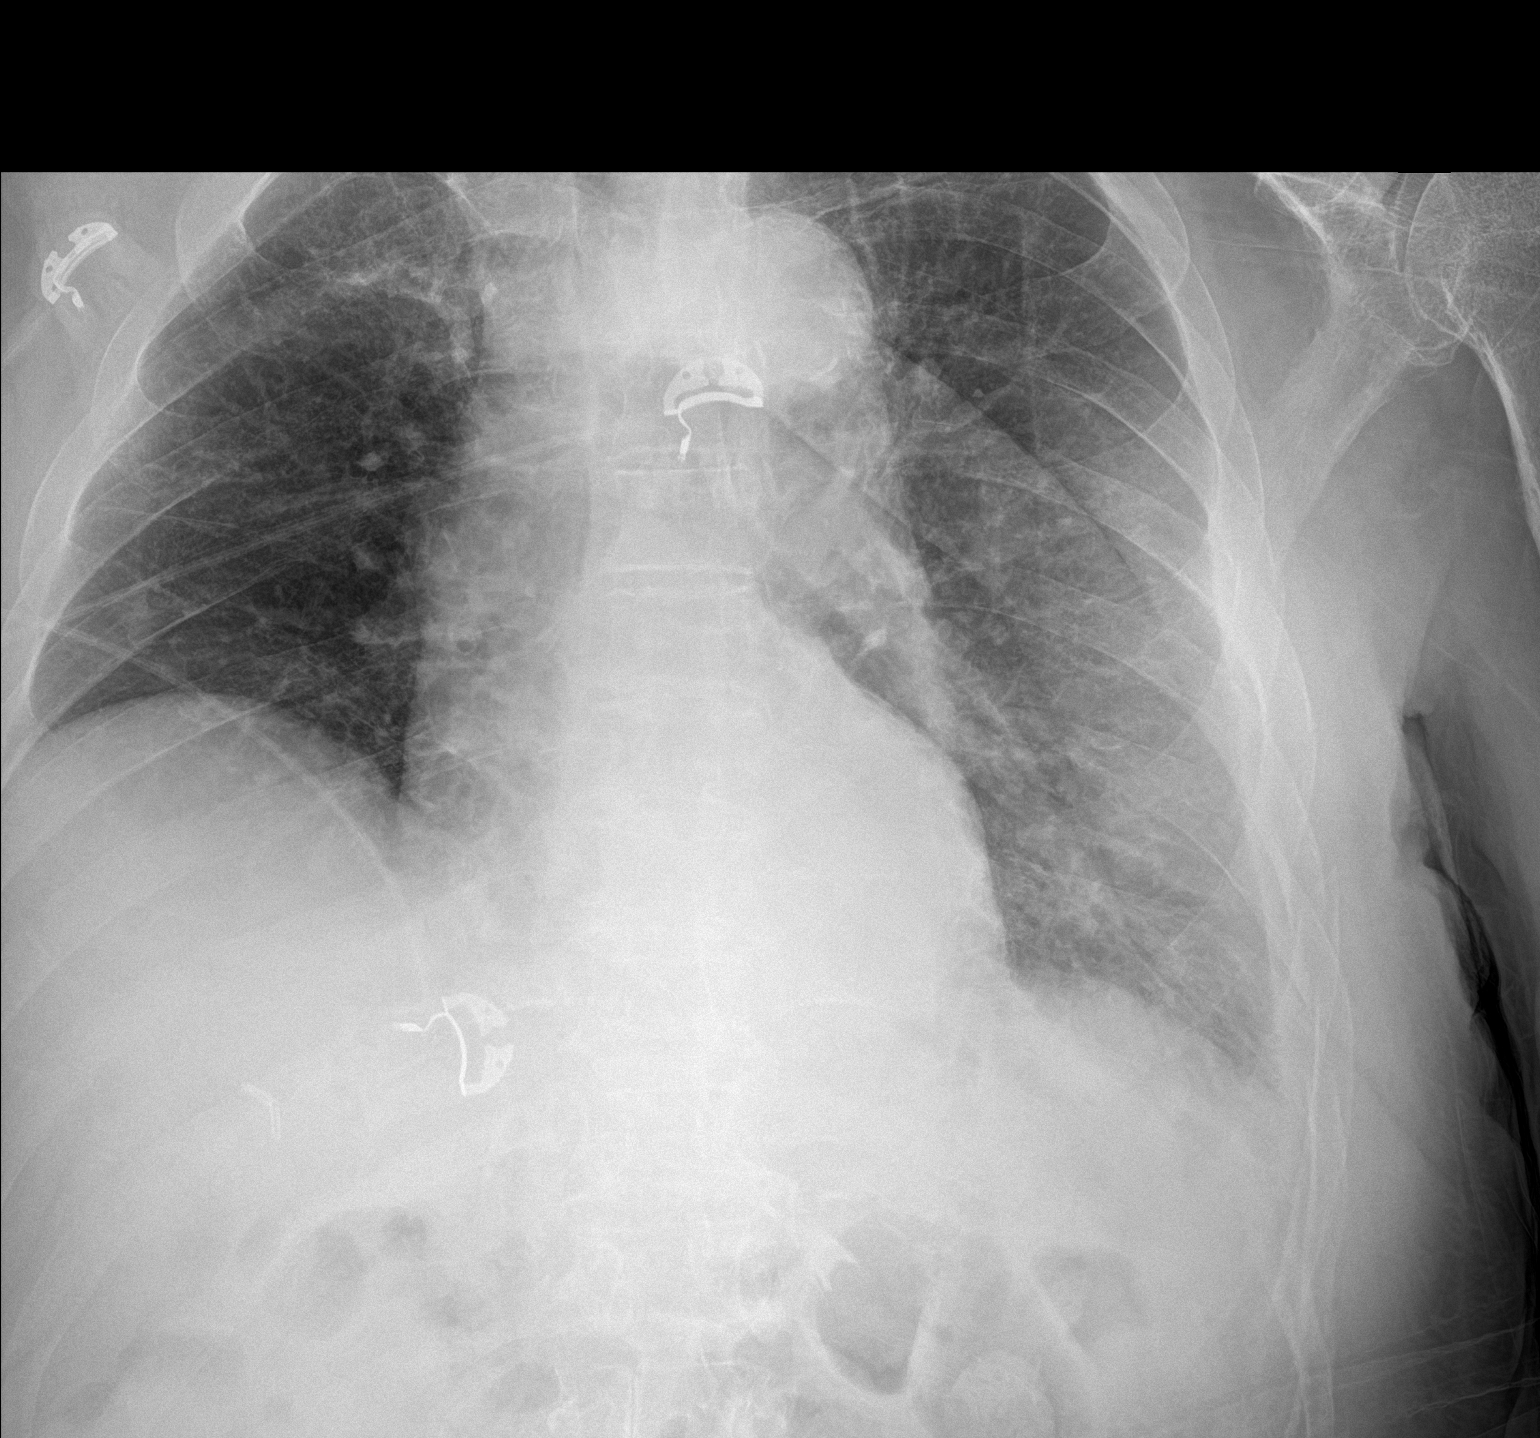

[1 of 1 positions shown; findings below may reference images not displayed]

FINDINGS: The tiny left-sided pneumothorax seen on the CT scan from yesterday
is not evident on today's x-ray. Prominent skin folds overlie the
left mid lung. There is a tiny left pleural effusion with left
basilar atelectasis. Asymmetric elevation right hemidiaphragm again
noted. The cardiopericardial silhouette is within normal limits for
size. The visualized bony structures of the thorax are intact.
IMPRESSION: No evidence for pneumothorax on today's x-ray.

Left base atelectasis with small left pleural effusion.

## 2019-01-18 ENCOUNTER — Ambulatory Visit: Payer: Medicare Other | Admitting: Adult Health

## 2019-05-03 ENCOUNTER — Other Ambulatory Visit: Payer: Medicare Other

## 2019-05-10 ENCOUNTER — Ambulatory Visit: Payer: Medicare Other | Admitting: Oncology
